# Patient Record
Sex: Female | Born: 1995 | Race: Black or African American | Hispanic: No | Smoking: Former smoker
Health system: Southern US, Community
[De-identification: ages and names within clinical notes are randomized; demographics above are authoritative.]

## PROBLEM LIST (undated history)

## (undated) DIAGNOSIS — C801 Malignant (primary) neoplasm, unspecified: Secondary | ICD-10-CM

## (undated) DIAGNOSIS — K219 Gastro-esophageal reflux disease without esophagitis: Secondary | ICD-10-CM

## (undated) DIAGNOSIS — I10 Essential (primary) hypertension: Secondary | ICD-10-CM

## (undated) DIAGNOSIS — J45909 Unspecified asthma, uncomplicated: Secondary | ICD-10-CM

---
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
## 2015-02-19 ENCOUNTER — Encounter (HOSPITAL_COMMUNITY): Payer: Self-pay | Admitting: Emergency Medicine

## 2015-02-19 ENCOUNTER — Emergency Department (HOSPITAL_COMMUNITY)
Admission: EM | Admit: 2015-02-19 | Discharge: 2015-02-19 | Disposition: A | Discharge status: Home / Self Care | Payer: No Typology Code available for payment source | Attending: Emergency Medicine | Admitting: Emergency Medicine

## 2015-02-19 DIAGNOSIS — Z79899 Other long term (current) drug therapy: Secondary | ICD-10-CM | POA: Insufficient documentation

## 2015-02-19 DIAGNOSIS — Y9389 Activity, other specified: Secondary | ICD-10-CM | POA: Diagnosis not present

## 2015-02-19 DIAGNOSIS — Z041 Encounter for examination and observation following transport accident: Secondary | ICD-10-CM | POA: Diagnosis present

## 2015-02-19 DIAGNOSIS — S8992XA Unspecified injury of left lower leg, initial encounter: Secondary | ICD-10-CM | POA: Insufficient documentation

## 2015-02-19 DIAGNOSIS — Y92410 Unspecified street and highway as the place of occurrence of the external cause: Secondary | ICD-10-CM | POA: Diagnosis not present

## 2015-02-19 DIAGNOSIS — J45909 Unspecified asthma, uncomplicated: Secondary | ICD-10-CM | POA: Insufficient documentation

## 2015-02-19 DIAGNOSIS — S4991XA Unspecified injury of right shoulder and upper arm, initial encounter: Secondary | ICD-10-CM | POA: Diagnosis not present

## 2015-02-19 DIAGNOSIS — S79912A Unspecified injury of left hip, initial encounter: Secondary | ICD-10-CM | POA: Diagnosis not present

## 2015-02-19 DIAGNOSIS — Z720 Tobacco use: Secondary | ICD-10-CM | POA: Diagnosis not present

## 2015-02-19 DIAGNOSIS — W2210XA Striking against or struck by unspecified automobile airbag, initial encounter: Secondary | ICD-10-CM

## 2015-02-19 DIAGNOSIS — Y998 Other external cause status: Secondary | ICD-10-CM | POA: Diagnosis not present

## 2015-02-19 MED ORDER — HYDROCORTISONE 1 % EX CREA
TOPICAL_CREAM | CUTANEOUS | Status: AC
  Administered 2015-02-19: 22:00:00 via TOPICAL
  Filled 2015-02-19 (×2): qty 28

## 2015-02-19 MED ORDER — IBUPROFEN 400 MG PO TABS
600.0000 mg | ORAL_TABLET | Freq: Once | ORAL | Status: AC
  Administered 2015-02-19: 600 mg via ORAL
  Filled 2015-02-19 (×2): qty 1

## 2015-02-19 NOTE — Discharge Instructions (Signed)
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
## 2015-05-20 ENCOUNTER — Emergency Department (HOSPITAL_COMMUNITY)
Admission: EM | Admit: 2015-05-20 | Discharge: 2015-05-20 | Disposition: A | Discharge status: Home / Self Care | Payer: Medicaid Other | Attending: Emergency Medicine | Admitting: Emergency Medicine

## 2015-05-20 ENCOUNTER — Encounter (HOSPITAL_COMMUNITY): Payer: Self-pay

## 2015-05-20 DIAGNOSIS — Z3202 Encounter for pregnancy test, result negative: Secondary | ICD-10-CM | POA: Diagnosis not present

## 2015-05-20 DIAGNOSIS — J45909 Unspecified asthma, uncomplicated: Secondary | ICD-10-CM | POA: Diagnosis not present

## 2015-05-20 DIAGNOSIS — R112 Nausea with vomiting, unspecified: Secondary | ICD-10-CM | POA: Insufficient documentation

## 2015-05-20 DIAGNOSIS — R1011 Right upper quadrant pain: Secondary | ICD-10-CM | POA: Diagnosis not present

## 2015-05-20 LAB — COMPREHENSIVE METABOLIC PANEL
ALBUMIN: 4 g/dL (ref 3.5–5.0)
ALT: 33 U/L (ref 14–54)
ANION GAP: 10 (ref 5–15)
AST: 33 U/L (ref 15–41)
Alkaline Phosphatase: 51 U/L (ref 38–126)
BUN: 7 mg/dL (ref 6–20)
CO2: 22 mmol/L (ref 22–32)
Calcium: 9.2 mg/dL (ref 8.9–10.3)
Chloride: 108 mmol/L (ref 101–111)
Creatinine, Ser: 0.79 mg/dL (ref 0.44–1.00)
GFR calc Af Amer: >60 mL/min (ref >60)
GFR calc non Af Amer: >60 mL/min (ref >60)
GLUCOSE: 141 mg/dL — AB (ref 65–99)
POTASSIUM: 3.7 mmol/L (ref 3.5–5.1)
SODIUM: 140 mmol/L (ref 135–145)
Total Bilirubin: 0.4 mg/dL (ref 0.3–1.2)
Total Protein: 7.3 g/dL (ref 6.5–8.1)

## 2015-05-20 LAB — URINALYSIS, ROUTINE W REFLEX MICROSCOPIC
Bilirubin Urine: NEGATIVE
Ketones, ur: 15 mg/dL — AB
Leukocytes, UA: NEGATIVE
Nitrite: NEGATIVE
Protein, ur: NEGATIVE mg/dL
SPECIFIC GRAVITY, URINE: 1.029 (ref 1.005–1.030)
UROBILINOGEN UA: 1 mg/dL (ref 0.0–1.0)
pH: 7 (ref 5.0–8.0)

## 2015-05-20 LAB — CBC
HEMATOCRIT: 35.7 % — AB (ref 36.0–46.0)
HEMOGLOBIN: 11.9 g/dL — AB (ref 12.0–15.0)
MCH: 30.7 pg (ref 26.0–34.0)
MCHC: 33.3 g/dL (ref 30.0–36.0)
MCV: 92 fL (ref 78.0–100.0)
Platelets: 250 10*3/uL (ref 150–400)
RBC: 3.88 MIL/uL (ref 3.87–5.11)
RDW: 12.7 % (ref 11.5–15.5)
WBC: 11.8 10*3/uL — ABNORMAL HIGH (ref 4.0–10.5)

## 2015-05-20 LAB — LIPASE, BLOOD: Lipase: 18 U/L — ABNORMAL LOW (ref 22–51)

## 2015-05-20 LAB — I-STAT BETA HCG BLOOD, ED (MC, WL, AP ONLY)

## 2015-05-20 LAB — URINE MICROSCOPIC-ADD ON

## 2015-05-20 MED ORDER — ONDANSETRON HCL 4 MG PO TABS: 4.0000 mg | ORAL_TABLET | Freq: Four times a day (QID) | ORAL | Status: DC

## 2015-05-20 MED ORDER — SODIUM CHLORIDE 0.9 % IV BOLUS (SEPSIS)
1000.0000 mL | Freq: Once | INTRAVENOUS | Status: AC
  Administered 2015-05-20: 1000 mL via INTRAVENOUS

## 2015-05-20 MED ORDER — ONDANSETRON HCL 4 MG/2ML IJ SOLN
4.0000 mg | Freq: Once | INTRAMUSCULAR | Status: AC | PRN
  Administered 2015-05-20: 4 mg via INTRAVENOUS
  Filled 2015-05-20: qty 2

## 2015-05-20 NOTE — Discharge Instructions (Signed)
Follow up with your PCP regarding high blood sugar and diabetes work up. Follow up with [facility] for heavy/painful periods.  Nausea and Vomiting Nausea means you feel sick to your stomach. Throwing up (vomiting) is a reflex where stomach contents come out of your mouth. HOME CARE   Take medicine as told by your doctor.  Do not force yourself to eat. However, you do need to drink fluids.  If you feel like eating, eat a normal diet as told by your doctor.  Eat rice, wheat, potatoes, bread, lean meats, yogurt, fruits, and vegetables.  Avoid high-fat foods.  Drink enough fluids to keep your pee (urine) clear or pale yellow.  Ask your doctor how to replace body fluid losses (rehydrate). Signs of body fluid loss (dehydration) include:  Feeling very thirsty.  Dry lips and mouth.  Feeling dizzy.  Dark pee.  Peeing less than normal.  Feeling confused.  Fast breathing or heart rate. GET HELP RIGHT AWAY IF:   You have blood in your throw up.  You have black or bloody poop (stool).  You have a bad headache or stiff neck.  You feel confused.  You have bad belly (abdominal) pain.  You have chest pain or trouble breathing.  You do not pee at least once every 8 hours.  You have cold, clammy skin.  You keep throwing up after 24 to 48 hours.  You have a fever. MAKE SURE YOU:   Understand these instructions.  Will watch your condition.  Will get help right away if you are not doing well or get worse. Document Released: 02/03/2008 Document Revised: 11/09/2011 Document Reviewed: 01/16/2011 [facility] Patient Information 2015 [place], [place]. This information is not intended to replace advice given to you by your health care provider. Make sure you discuss any questions you have with your health care provider.

## 2015-05-20 NOTE — ED Notes (Signed)
Pt given gingerale and crackers 

## 2015-05-20 NOTE — ED Provider Notes (Signed)
  Face-to-face evaluation   History: She complains of recurrent vomiting associated with menses, and have vaginal bleeding with menses.    Physical exam: Alert, calm, cooperative. She appears comfortable.    Medical screening examination/treatment/procedure(s) were conducted as a shared visit with non-physician practitioner(s) and myself.  I personally evaluated the patient during the encounter  Mancel Bale, MD 05/20/15 2100

## 2015-05-20 NOTE — ED Notes (Signed)
C/o vomiting since 1 am denies abdominal pain

## 2015-05-20 NOTE — ED Provider Notes (Signed)
CSN: [id number]     Arrival date & time 05/20/15  0919 History   First MD Initiated Contact with Patient 05/20/15 1158     Chief Complaint  Patient presents with  . Emesis     (Consider location/radiation/quality/duration/timing/severity/associated sxs/prior Treatment) HPI   Brandi [name] is a 19 y.o. female with PMH significant for asthma who presents with NBNB emesis since 1 AM this morning with associated anorexia, decreased PO intake, nausea, chills, and abdominal pain.  She states she has had approximately 3 episodes of emesis every hour with the last episode at 11:30 AM.  Denies CP, SOB, diarrhea, urinary symptoms, fever, HA, or neck stiffness.  She is currently on her period.  No history of abdominal surgeries.  She has received zofran, and states that her nausea is much improved.   Past Medical History  Diagnosis Date  . Asthma    History reviewed. No pertinent past surgical history. History reviewed. No pertinent family history. Social History  Substance Use Topics  . Smoking status: Never Smoker   . Smokeless tobacco: None  . Alcohol Use: No   OB History    Gravida Para Term Preterm AB TAB SAB Ectopic Multiple Living       Review of Systems All other systems negative unless otherwise stated in HPI    Allergies  Shellfish allergy  Home Medications   Prior to Admission medications   Medication Sig Start Date End Date Taking? Authorizing Provider  acetaminophen (TYLENOL) 325 MG tablet Take 650 mg by mouth every 6 (six) hours as needed for mild pain.   Yes Historical Provider, MD  Triprolidine-Pseudoephedrine (ANTIHISTAMINE PO) Take 3 tablets by mouth daily as needed (allergies).   Yes Historical Provider, MD  loratadine-pseudoephedrine (CLARITIN-D 12 HOUR) 5-120 MG per tablet Take 1 tablet by mouth 2 (two) times daily. Patient not taking: Reported on 05/20/2015 10/31/12   Santiago Glad, PA-C   BP 111/69 mmHg  Pulse 78  Temp(Src) 98.5 F  (36.9 C) (Oral)  Resp 16  Wt 138 lb (62.596 kg)  SpO2 100%  LMP 05/20/2015 Physical Exam  Constitutional: She is oriented to person, place, and time. She appears well-developed and well-nourished.  HENT:  Head: Normocephalic and atraumatic.  Mouth/Throat: Oropharynx is clear and moist.  Eyes: Pupils are equal, round, and reactive to light.  Neck: Normal range of motion. Neck supple.  Cardiovascular: Normal rate, regular rhythm and normal heart sounds.   No murmur heard. Pulmonary/Chest: Effort normal and breath sounds normal. No respiratory distress. She has no wheezes. She has no rales.  Abdominal: Soft. Bowel sounds are normal. She exhibits no distension. There is tenderness in the right upper quadrant. There is no rigidity, no rebound and no guarding.  Musculoskeletal: Normal range of motion.  Lymphadenopathy:    She has no cervical adenopathy.  Neurological: She is alert and oriented to person, place, and time.  Skin: Skin is warm and dry.  Psychiatric: She has a normal mood and affect. Her behavior is normal.    ED Course  Procedures (including critical care time) Labs Review Labs Reviewed  LIPASE, BLOOD - Abnormal; Notable for the following:    Lipase 18 (*)    All other components within normal limits  COMPREHENSIVE METABOLIC PANEL - Abnormal; Notable for the following:    Glucose, Bld 141 (*)    All other components within normal limits  CBC - Abnormal; Notable for the following:  WBC 11.8 (*)    Hemoglobin 11.9 (*)    HCT 35.7 (*)    All other components within normal limits  URINALYSIS, ROUTINE W REFLEX MICROSCOPIC (NOT AT [facility]) - Abnormal; Notable for the following:    Glucose, UA >1000 (*)    Hgb urine dipstick SMALL (*)    Ketones, ur 15 (*)    All other components within normal limits  URINE MICROSCOPIC-ADD ON  I-STAT BETA HCG BLOOD, ED ([facility], [facility], [facility] ONLY)    Imaging Review No results found. I have personally reviewed and evaluated these images and lab  results as part of my medical decision-making.   EKG Interpretation None      MDM   Final diagnoses:  None   Patient presents with emesis.  VSS, patient appears nontoxic, NAD.  On exam, mild RUQ tenderness.  rebound, guarding, or rigidity. Low suspicion for acute abdominal etiology. Imaging not performed.  Labs include lipase, CMP, CBC, UA, and hcg.  UA shows >1000 glucose most likely stress response to wretching and frequent episodes of emesis. Pt given zofran and fluids in ED. Suspect nausea vomiting. Pt stable for d/c.  Pt able to tolerate PO intake.  Advised to follow up with PCP and gynecology (for menorrhagia).  Discussed return precautions and supportive care.  Patient acknowledges and agrees with the above plan.  Case has been discussed with and seen by Dr. Effie Shy who agrees with the above plan for discharge.     Cheri Fowler, PA-C 05/20/15 1508  Mancel Bale, MD 05/20/15 2100

## 2016-03-04 ENCOUNTER — Emergency Department (HOSPITAL_COMMUNITY)
Admission: EM | Admit: 2016-03-04 | Discharge: 2016-03-05 | Disposition: A | Discharge status: Home / Self Care | Payer: Medicaid Other | Attending: Emergency Medicine | Admitting: Emergency Medicine

## 2016-03-04 ENCOUNTER — Encounter (HOSPITAL_COMMUNITY): Payer: Self-pay

## 2016-03-04 DIAGNOSIS — J45909 Unspecified asthma, uncomplicated: Secondary | ICD-10-CM | POA: Diagnosis not present

## 2016-03-04 DIAGNOSIS — R112 Nausea with vomiting, unspecified: Secondary | ICD-10-CM | POA: Diagnosis present

## 2016-03-04 LAB — COMPREHENSIVE METABOLIC PANEL
ALT: 42 U/L (ref 14–54)
ANION GAP: 12 (ref 5–15)
AST: 39 U/L (ref 15–41)
Albumin: 4.5 g/dL (ref 3.5–5.0)
Alkaline Phosphatase: 53 U/L (ref 38–126)
BUN: 8 mg/dL (ref 6–20)
CHLORIDE: 105 mmol/L (ref 101–111)
CO2: 20 mmol/L — ABNORMAL LOW (ref 22–32)
Calcium: 9.8 mg/dL (ref 8.9–10.3)
Creatinine, Ser: 0.81 mg/dL (ref 0.44–1.00)
Glucose, Bld: 155 mg/dL — ABNORMAL HIGH (ref 65–99)
POTASSIUM: 3.6 mmol/L (ref 3.5–5.1)
SODIUM: 137 mmol/L (ref 135–145)
Total Bilirubin: 0.3 mg/dL (ref 0.3–1.2)
Total Protein: 7.9 g/dL (ref 6.5–8.1)

## 2016-03-04 LAB — CBC
HEMATOCRIT: 38.2 % (ref 36.0–46.0)
HEMOGLOBIN: 12.8 g/dL (ref 12.0–15.0)
MCH: 30.7 pg (ref 26.0–34.0)
MCHC: 33.5 g/dL (ref 30.0–36.0)
MCV: 91.6 fL (ref 78.0–100.0)
PLATELETS: 303 10*3/uL (ref 150–400)
RBC: 4.17 MIL/uL (ref 3.87–5.11)
RDW: 12.5 % (ref 11.5–15.5)
WBC: 15.7 10*3/uL — AB (ref 4.0–10.5)

## 2016-03-04 LAB — I-STAT BETA HCG BLOOD, ED (MC, WL, AP ONLY): I-stat hCG, quantitative: <5 m[IU]/mL (ref <5)

## 2016-03-04 LAB — LIPASE, BLOOD: LIPASE: 18 U/L (ref 11–51)

## 2016-03-04 MED ORDER — ONDANSETRON 4 MG PREPACK (~~LOC~~): 1.0000 | ORAL_TABLET | Freq: Three times a day (TID) | ORAL | Status: DC | PRN

## 2016-03-04 MED ORDER — ONDANSETRON HCL 4 MG/2ML IJ SOLN
4.0000 mg | Freq: Once | INTRAMUSCULAR | Status: AC
  Administered 2016-03-04: 4 mg via INTRAVENOUS
  Filled 2016-03-04: qty 2

## 2016-03-04 MED ORDER — SODIUM CHLORIDE 0.9 % IV BOLUS (SEPSIS)
1000.0000 mL | Freq: Once | INTRAVENOUS | Status: AC
  Administered 2016-03-04: 1000 mL via INTRAVENOUS

## 2016-03-04 NOTE — ED Notes (Signed)
Pt resting quietly at this time with eyes closed,

## 2016-03-04 NOTE — ED Provider Notes (Signed)
CSN: [id number]     Arrival date & time 03/04/16  1833 History   First MD Initiated Contact with Patient 03/04/16 1853     Chief Complaint  Patient presents with  . Emesis     (Consider location/radiation/quality/duration/timing/severity/associated sxs/prior Treatment) HPI Patient presents with nausea vomiting since 7 AM this morning. She endorses greater than 10 episodes of emesis throughout the day. No chest pain or difficulty breathing. She does not have any hematemesis. She is currently on her period and states that she has emesis nearly every period and is here today for relief of her nausea. She endorses lower abdominal cramping consistent with her period.   She does not think there is any chance that she is pregnant as she has not had intercourse with a female. She does not have any vaginal discharge dysuria or unilateral pelvic pain.   Past Medical History  Diagnosis Date  . Asthma    History reviewed. No pertinent past surgical history. No family history on file. Social History  Substance Use Topics  . Smoking status: Never Smoker   . Smokeless tobacco: None  . Alcohol Use: No   OB History    Gravida Para Term Preterm AB TAB SAB Ectopic Multiple Living   0 0 0 0 0 0 0 0 0 0      Review of Systems  Constitutional: Negative for chills and activity change.  HENT: Negative for congestion.   Eyes: Negative for visual disturbance.  Respiratory: Negative for shortness of breath.   Gastrointestinal: Positive for nausea, vomiting and abdominal pain. Negative for diarrhea, constipation, blood in stool and abdominal distention.  Genitourinary: Positive for vaginal bleeding. Negative for dysuria, decreased urine volume, vaginal discharge and vaginal pain.  Skin: Negative for rash and wound.  Neurological: Positive for tremors.  Psychiatric/Behavioral: Negative for confusion and agitation.      Allergies  Shellfish allergy  Home Medications   Prior to Admission medications    Medication Sig Start Date End Date Taking? Authorizing Provider  acetaminophen (TYLENOL) 325 MG tablet Take 650 mg by mouth every 6 (six) hours as needed for mild pain.   Yes Historical Provider, MD  ondansetron (ZOFRAN) 4 MG tablet Take 1 tablet (4 mg total) by mouth every 6 (six) hours. 05/20/15  Yes Kayla Rose, PA-C  Triprolidine-Pseudoephedrine (ANTIHISTAMINE PO) Take 3 tablets by mouth daily as needed (allergies).   Yes Historical Provider, MD  loratadine-pseudoephedrine (CLARITIN-D 12 HOUR) 5-120 MG per tablet Take 1 tablet by mouth 2 (two) times daily. Patient not taking: Reported on 05/20/2015 10/31/12   Heather Laisure, PA-C   BP 143/91 mmHg  Pulse 85  Temp(Src) 98.9 F (37.2 C) (Oral)  Resp 24  Ht 5\' 2"  (1.575 m)  Wt 63.05 kg  BMI 25.42 kg/m2  SpO2 100%  LMP 03/02/2016 Physical Exam  Constitutional: She is oriented to person, place, and time. She appears well-developed and well-nourished. No distress.  HENT:  Head: Normocephalic and atraumatic.  Eyes: Conjunctivae are normal.  Cardiovascular: Normal rate and normal heart sounds.   No murmur heard. Pulmonary/Chest: Effort normal and breath sounds normal.  Abdominal: Soft. She exhibits no distension. There is no tenderness. There is no rebound and no guarding.  Musculoskeletal: She exhibits no edema.  Neurological: She is alert and oriented to person, place, and time.  Skin: Skin is warm. She is not diaphoretic.  Psychiatric: She has a normal mood and affect. Her behavior is normal.  Nursing note and vitals reviewed.   ED  Course  Procedures (including critical care time) Labs Review Labs Reviewed  CBC - Abnormal; Notable for the following:    WBC 15.7 (*)    All other components within normal limits  LIPASE, BLOOD  COMPREHENSIVE METABOLIC PANEL  URINALYSIS, ROUTINE W REFLEX MICROSCOPIC (NOT [facility])  I-STAT BETA HCG BLOOD, ED ([facility], [facility], [facility] ONLY)    Imaging Review No results found. I have personally reviewed and  evaluated these images and lab results as part of my medical decision-making.   EKG Interpretation None      MDM   Final diagnoses:  None    Ms. Schriever presenting for multiple episodes of emesis associated with her period consistent with many previous periods. She feels improved with 4 mg Zofran. She's been seen for similar episodes in the past. She denies any vaginal discharge or unilateral pain. Pregnancy test negative. Patient discharged home in good condition with instructions to follow up with PCP as needed.  Levora AngelEric Annamaria Salah, MD 03/04/16 2255  Azalia BilisKevin Campos, MD 03/04/16 [phone]

## 2016-03-04 NOTE — Discharge Instructions (Signed)

## 2016-03-04 NOTE — ED Notes (Signed)
Pt st's she is on her period and has severe cramping.  Also st's she has nausea and vomiting every month when she starts her period

## 2016-03-04 NOTE — ED Notes (Signed)
Patient here with vomiting and abdominal cramping since 0700. Denies diarrhea.  Reports multiple episodes of vomiting

## 2016-03-04 NOTE — ED Notes (Signed)
Pt is awake now and requesting more nausea medication.  Zofran given for same

## 2016-03-05 ENCOUNTER — Telehealth: Payer: Self-pay | Admitting: *Deleted

## 2016-03-06 ENCOUNTER — Emergency Department (HOSPITAL_COMMUNITY)
Admission: EM | Admit: 2016-03-06 | Discharge: 2016-03-06 | Disposition: A | Discharge status: Home / Self Care | Payer: Medicaid Other | Attending: Emergency Medicine | Admitting: Emergency Medicine

## 2016-03-06 ENCOUNTER — Emergency Department (HOSPITAL_COMMUNITY): Payer: Medicaid Other

## 2016-03-06 ENCOUNTER — Encounter (HOSPITAL_COMMUNITY): Payer: Self-pay | Admitting: Physical Medicine and Rehabilitation

## 2016-03-06 DIAGNOSIS — Z79899 Other long term (current) drug therapy: Secondary | ICD-10-CM | POA: Diagnosis not present

## 2016-03-06 DIAGNOSIS — I880 Nonspecific mesenteric lymphadenitis: Secondary | ICD-10-CM | POA: Diagnosis not present

## 2016-03-06 DIAGNOSIS — J45909 Unspecified asthma, uncomplicated: Secondary | ICD-10-CM | POA: Diagnosis not present

## 2016-03-06 DIAGNOSIS — K529 Noninfective gastroenteritis and colitis, unspecified: Secondary | ICD-10-CM | POA: Insufficient documentation

## 2016-03-06 DIAGNOSIS — R109 Unspecified abdominal pain: Secondary | ICD-10-CM

## 2016-03-06 DIAGNOSIS — R1084 Generalized abdominal pain: Secondary | ICD-10-CM | POA: Diagnosis present

## 2016-03-06 LAB — COMPREHENSIVE METABOLIC PANEL
ALBUMIN: 3.9 g/dL (ref 3.5–5.0)
ALT: 34 U/L (ref 14–54)
ANION GAP: 5 (ref 5–15)
AST: 27 U/L (ref 15–41)
Alkaline Phosphatase: 48 U/L (ref 38–126)
BUN: 11 mg/dL (ref 6–20)
CHLORIDE: 107 mmol/L (ref 101–111)
CO2: 27 mmol/L (ref 22–32)
Calcium: 9.3 mg/dL (ref 8.9–10.3)
Creatinine, Ser: 0.77 mg/dL (ref 0.44–1.00)
GFR calc Af Amer: >60 mL/min (ref >60)
GLUCOSE: 114 mg/dL — AB (ref 65–99)
POTASSIUM: 3.4 mmol/L — AB (ref 3.5–5.1)
Sodium: 139 mmol/L (ref 135–145)
TOTAL PROTEIN: 7.4 g/dL (ref 6.5–8.1)
Total Bilirubin: 0.6 mg/dL (ref 0.3–1.2)

## 2016-03-06 LAB — CBC WITH DIFFERENTIAL/PLATELET
BASOS ABS: 0 10*3/uL (ref 0.0–0.1)
BASOS PCT: 0 %
EOS PCT: 0 %
Eosinophils Absolute: 0 10*3/uL (ref 0.0–0.7)
HCT: 36.7 % (ref 36.0–46.0)
Hemoglobin: 12.2 g/dL (ref 12.0–15.0)
Lymphocytes Relative: 10 %
Lymphs Abs: 0.9 10*3/uL (ref 0.7–4.0)
MCH: 30.7 pg (ref 26.0–34.0)
MCHC: 33.2 g/dL (ref 30.0–36.0)
MCV: 92.2 fL (ref 78.0–100.0)
MONO ABS: 0.4 10*3/uL (ref 0.1–1.0)
MONOS PCT: 4 %
Neutro Abs: 7.9 10*3/uL — ABNORMAL HIGH (ref 1.7–7.7)
Neutrophils Relative %: 86 %
PLATELETS: 287 10*3/uL (ref 150–400)
RBC: 3.98 MIL/uL (ref 3.87–5.11)
RDW: 12.7 % (ref 11.5–15.5)
WBC: 9.2 10*3/uL (ref 4.0–10.5)

## 2016-03-06 LAB — WET PREP, GENITAL
CLUE CELLS WET PREP: NONE SEEN
Sperm: NONE SEEN
TRICH WET PREP: NONE SEEN
Yeast Wet Prep HPF POC: NONE SEEN

## 2016-03-06 LAB — LIPASE, BLOOD: LIPASE: 20 U/L (ref 11–51)

## 2016-03-06 MED ORDER — METOCLOPRAMIDE HCL 5 MG/ML IJ SOLN
10.0000 mg | Freq: Once | INTRAMUSCULAR | Status: AC
  Administered 2016-03-06: 10 mg via INTRAVENOUS
  Filled 2016-03-06: qty 2

## 2016-03-06 MED ORDER — PROMETHAZINE HCL 12.5 MG PO TABS: 12.5000 mg | ORAL_TABLET | Freq: Four times a day (QID) | ORAL | Status: DC | PRN

## 2016-03-06 MED ORDER — IOPAMIDOL (ISOVUE-300) INJECTION 61%
INTRAVENOUS | Status: AC
  Administered 2016-03-06: 75 mL via INTRAVENOUS
  Filled 2016-03-06: qty 75

## 2016-03-06 MED ORDER — SODIUM CHLORIDE 0.9 % IV BOLUS (SEPSIS)
1000.0000 mL | Freq: Once | INTRAVENOUS | Status: AC
  Administered 2016-03-06: 1000 mL via INTRAVENOUS

## 2016-03-06 NOTE — ED Notes (Signed)
Pelvic cart at bedside and set up.  

## 2016-03-06 NOTE — Discharge Instructions (Signed)
Take tylenol or motrin for pain. Phenergan for nausea and vomiting. Follow up with your doctor. Return if worsening.   Viral Gastroenteritis Viral gastroenteritis is also known as stomach flu. This condition affects the stomach and intestinal tract. It can cause sudden diarrhea and vomiting. The illness typically lasts 3 to 8 days. Most people develop an immune response that eventually gets rid of the virus. While this natural response develops, the virus can make you quite ill. CAUSES  Many different viruses can cause gastroenteritis, such as rotavirus or noroviruses. You can catch one of these viruses by consuming contaminated food or water. You may also catch a virus by sharing utensils or other personal items with an infected person or by touching a contaminated surface. SYMPTOMS  The most common symptoms are diarrhea and vomiting. These problems can cause a severe loss of body fluids (dehydration) and a body salt (electrolyte) imbalance. Other symptoms may include:  Fever.  Headache.  Fatigue.  Abdominal pain. DIAGNOSIS  Your caregiver can usually diagnose viral gastroenteritis based on your symptoms and a physical exam. A stool sample may also be taken to test for the presence of viruses or other infections. TREATMENT  This illness typically goes away on its own. Treatments are aimed at rehydration. The most serious cases of viral gastroenteritis involve vomiting so severely that you are not able to keep fluids down. In these cases, fluids must be given through an intravenous line (IV). HOME CARE INSTRUCTIONS   Drink enough fluids to keep your urine clear or pale yellow. Drink small amounts of fluids frequently and increase the amounts as tolerated.  Ask your caregiver for specific rehydration instructions.  Avoid:  Foods high in sugar.  Alcohol.  Carbonated drinks.  Tobacco.  Juice.  Caffeine drinks.  Extremely hot or cold fluids.  Fatty, greasy foods.  Too much  intake of anything at one time.  Dairy products until 24 to 48 hours after diarrhea stops.  You may consume probiotics. Probiotics are active cultures of beneficial bacteria. They may lessen the amount and number of diarrheal stools in adults. Probiotics can be found in yogurt with active cultures and in supplements.  Wash your hands well to avoid spreading the virus.  Only take over-the-counter or prescription medicines for pain, discomfort, or fever as directed by your caregiver. Do not give aspirin to children. Antidiarrheal medicines are not recommended.  Ask your caregiver if you should continue to take your regular prescribed and over-the-counter medicines.  Keep all follow-up appointments as directed by your caregiver. SEEK IMMEDIATE MEDICAL CARE IF:   You are unable to keep fluids down.  You do not urinate at least once every 6 to 8 hours.  You develop shortness of breath.  You notice blood in your stool or vomit. This may look like coffee grounds.  You have abdominal pain that increases or is concentrated in one small area (localized).  You have persistent vomiting or diarrhea.  You have a fever.  The patient is a child younger than 3 months, and he or she has a fever.  The patient is a child older than 3 months, and he or she has a fever and persistent symptoms.  The patient is a child older than 3 months, and he or she has a fever and symptoms suddenly get worse.  The patient is a baby, and he or she has no tears when crying. MAKE SURE YOU:   Understand these instructions.  Will watch your condition.  Will get  help right away if you are not doing well or get worse.   This information is not intended to replace advice given to you by your health care provider. Make sure you discuss any questions you have with your health care provider.   Document Released: 08/17/2005 Document Revised: 11/09/2011 Document Reviewed: 06/03/2011 Elsevier Interactive Patient  Education [employer]2016 [employer] Inc[employer]

## 2016-03-06 NOTE — ED Notes (Signed)
Patient able to ambulate independently  

## 2016-03-06 NOTE — ED Provider Notes (Signed)
CSN: [id number]     Arrival date & time 03/06/16  1002 History   First MD Initiated Contact with Patient 03/06/16 1023     Chief Complaint  Patient presents with  . Abdominal Pain     (Consider location/radiation/quality/duration/timing/severity/associated sxs/prior Treatment) HPI Cathryne Silba is a 20 y.o. female with history of asthma, presents to emergency department with complaint of nausea, vomiting, abdominal pain. Patient states her abdominal pain is generalized. Symptoms started 2 days ago. She was seen in emergency department at that time and had blood work done and had medications to help with her symptoms. No imaging was obtained at this time. Patient reported during last visit that she has had similar symptoms before with her menstrual cycles, and she is currently menstruating. She is sexually active, denies possible pregnancy. She denies any abnormal vaginal discharge. She states she is throwing up food contents. She hasn't been able to eat in 2 days. States she drank some ginger ale but threw it up. She states she has tried taking Zofran, but states the taste of Zofran is making her throw up as well   Past Medical History  Diagnosis Date  . Asthma    History reviewed. No pertinent past surgical history. No family history on file. Social History  Substance Use Topics  . Smoking status: Never Smoker   . Smokeless tobacco: None  . Alcohol Use: No   OB History    Gravida Para Term Preterm AB TAB SAB Ectopic Multiple Living       Review of Systems  Constitutional: Negative for fever and chills.  Respiratory: Negative for cough, chest tightness and shortness of breath.   Cardiovascular: Negative for chest pain, palpitations and leg swelling.  Gastrointestinal: Positive for nausea, vomiting and abdominal pain. Negative for diarrhea and blood in stool.  Genitourinary: Positive for vaginal bleeding. Negative for dysuria, flank pain, vaginal discharge, vaginal  pain and pelvic pain.  Musculoskeletal: Negative for myalgias, arthralgias, neck pain and neck stiffness.  Skin: Negative for rash.  Neurological: Negative for dizziness, weakness and headaches.  All other systems reviewed and are negative.     Allergies  Shellfish allergy  Home Medications   Prior to Admission medications   Medication Sig Start Date End Date Taking? Authorizing Provider  acetaminophen (TYLENOL) 325 MG tablet Take 650 mg by mouth every 6 (six) hours as needed for mild pain.    Historical Provider, MD  loratadine-pseudoephedrine (CLARITIN-D 12 HOUR) 5-120 MG per tablet Take 1 tablet by mouth 2 (two) times daily. Patient not taking: Reported on 05/20/2015 10/31/12   Santiago Glad, PA-C  ondansetron (ZOFRAN) 4 MG tablet Take 1 tablet (4 mg total) by mouth every 6 (six) hours. 05/20/15   Kayla Rose, PA-C  ondansetron (ZOFRAN) 4 mg TABS tablet Take 4 tablets by mouth every 8 (eight) hours as needed. 03/04/16   Levora Angel, MD  Triprolidine-Pseudoephedrine (ANTIHISTAMINE PO) Take 3 tablets by mouth daily as needed (allergies).    Historical Provider, MD   BP 103/58 mmHg  Pulse 77  Temp(Src) 99 F (37.2 C) (Oral)  Resp 20  Ht  (1.575 m)  Wt 63.05 kg  BMI 25.42 kg/m2  SpO2 100%  LMP 03/02/2016 Physical Exam  Constitutional: She is oriented to person, place, and time. She appears well-developed and well-nourished. No distress.  HENT:  Head: Normocephalic.  Eyes: Conjunctivae are normal.  Neck: Neck supple.  Cardiovascular: Normal rate, regular rhythm  and normal heart sounds.   Pulmonary/Chest: Effort normal and breath sounds normal. No respiratory distress. She has no wheezes. She has no rales.  Abdominal: Soft. Bowel sounds are normal. She exhibits no distension. There is tenderness. There is no rebound.  Diffuse abdominal tenderness with worse tenderness in the right lower quadrant  Genitourinary:  Normal external genitalia. Normal vaginal canal. Small thin  white discharge. Cervix is normal, closed. No CMT. No uterine or adnexal tenderness. No masses palpated.    Musculoskeletal: She exhibits no edema.  Neurological: She is alert and oriented to person, place, and time.  Skin: Skin is warm and dry.  Psychiatric: She has a normal mood and affect. Her behavior is normal.  Nursing note and vitals reviewed.   ED Course  Procedures (including critical care time) Labs Review Labs Reviewed  WET PREP, GENITAL - Abnormal; Notable for the following:    WBC, Wet Prep HPF POC PRESENT (*)    All other components within normal limits  CBC WITH DIFFERENTIAL/PLATELET - Abnormal; Notable for the following:    Neutro Abs 7.9 (*)    All other components within normal limits  COMPREHENSIVE METABOLIC PANEL - Abnormal; Notable for the following:    Potassium 3.4 (*)    Glucose, Bld 114 (*)    All other components within normal limits  LIPASE, BLOOD  URINALYSIS, ROUTINE W REFLEX MICROSCOPIC (NOT AT [facility])  POC URINE PREG, ED  GC/CHLAMYDIA PROBE AMP ([place]) [facility]    Imaging Review Ct Abdomen Pelvis W Contrast  03/06/2016  CLINICAL DATA:  Diffuse abdominal pain with nausea and vomiting for 2 days EXAM: CT ABDOMEN AND PELVIS WITH CONTRAST TECHNIQUE: Multidetector CT imaging of the abdomen and pelvis was performed using the standard protocol following bolus administration of intravenous contrast. CONTRAST:  75 cc Isovue 300 COMPARISON:  None. FINDINGS: Lower chest:  No acute findings. Hepatobiliary: Liver and gallbladder appear normal. Pancreas: No mass, inflammatory changes, or other significant abnormality. Spleen: Within normal limits in size and appearance. Adrenals/Urinary Tract: Adrenal glands appear normal. Kidneys appear normal without stone or hydronephrosis. No ureteral or bladder calculi identified. Bladder appears normal, partially decompressed. Stomach/Bowel: Bowel is normal in caliber. The majority of the large bowel is decompressed which  limits characterization of its walls but there is no evidence of bowel wall thickening or bowel wall inflammation seen. Fluid is seen throughout a significant portion of the nondistended small bowel. Fluid is also present within the stomach. Appendix is not seen in its entirety but visualized portion appears normal and there is no inflammation about the cecum to suggest acute appendicitis. Vascular/Lymphatic: No pathologically enlarged lymph nodes. There are small lymph nodes clustered in the right lower quadrant mesentery and additional small lymph nodes scattered within the central abdominal mesentery. No evidence of abdominal aortic aneurysm. Reproductive: No mass or other significant abnormality. Other: No free fluid or abscess collections seen. No free intraperitoneal air. Musculoskeletal: No acute or suspicious osseous findings. Superficial soft tissues are unremarkable. IMPRESSION: 1. Fluid throughout a significant portion of the nondistended small bowel. There is also fluid within the stomach. This can be an indication of underlying gastroenteritis. Bowel is otherwise unremarkable. No bowel obstruction. 2. Small lymph nodes clustered within the right lower quadrant mesentery and additional small lymph nodes in the central abdominal mesentery. This may represent an associated mild mesenteric adenitis. 3. Remainder of the abdomen and pelvis CT is unremarkable, as detailed above. Electronically Signed   By: Bary RichardStan  Maynard  M.D.   On: 03/06/2016 14:29   I have personally reviewed and evaluated these images and lab results as part of my medical decision-making.   EKG Interpretation None      MDM   Final diagnoses:  Abdominal pain, unspecified abdominal location  Gastroenteritis  Mesenteric adenitis   Pt's 2nd visit in two days for same complaint. States she is having abdominal pain and vomiting. Right lower quadrant pain and exam. Will repeat labs and get CT abdomen and pelvis to rule out  appendicitis.  Pelvic exam unremarkable, doubt cervicitis or PID, cultures sent. Labs unremarkable and slightly low potassium of 3.4. CT abdomen and pelvis obtained, showing gastroenteritis and mesenteric adenitis. Will treat with NSAIDs and Tylenol, Phenergan for nausea, follow-up. Patient tolerated oral fluids emergency department. Pain improved with Reglan.  Filed Vitals:   03/06/16 1009 03/06/16 1039 03/06/16 1140 03/06/16 1345  BP: 125/70 103/58 127/72 111/70  Pulse: 73 77 70 65  Temp: 99.4 F (37.4 C) 99 F (37.2 C)    TempSrc: Oral Oral    Resp: 18 20 18    Height:  5\' 2"  (1.575 m)    Weight:  63.05 kg    SpO2: 100% 100% 100% 99%     Jaynie Crumbleatyana Jeniah Kishi, PA-C 03/06/16 1523  Linwood DibblesJon Knapp, MD 03/07/16 1501

## 2016-03-06 NOTE — ED Notes (Signed)
Pt reports diffuse abdominal pain. Onset Thursday. Also states nausea/vomiting. Was seen for same yesterday, no relief from symptoms.

## 2016-03-09 LAB — GC/CHLAMYDIA PROBE AMP (~~LOC~~) NOT AT ARMC
CHLAMYDIA, DNA PROBE: NEGATIVE
NEISSERIA GONORRHEA: NEGATIVE

## 2017-01-14 ENCOUNTER — Ambulatory Visit (HOSPITAL_COMMUNITY)
Admission: EM | Admit: 2017-01-14 | Discharge: 2017-01-14 | Disposition: A | Discharge status: Home / Self Care | Payer: Self-pay | Attending: Family Medicine | Admitting: Family Medicine

## 2017-01-14 ENCOUNTER — Ambulatory Visit (INDEPENDENT_AMBULATORY_CARE_PROVIDER_SITE_OTHER): Payer: Self-pay

## 2017-01-14 ENCOUNTER — Encounter (HOSPITAL_COMMUNITY): Payer: Self-pay | Admitting: Emergency Medicine

## 2017-01-14 DIAGNOSIS — S83501A Sprain of unspecified cruciate ligament of right knee, initial encounter: Secondary | ICD-10-CM

## 2017-01-14 MED ORDER — DICLOFENAC SODIUM 75 MG PO TBEC: 75.0000 mg | DELAYED_RELEASE_TABLET | Freq: Two times a day (BID) | ORAL | 0 refills | Status: DC

## 2017-01-14 NOTE — ED Provider Notes (Signed)
[facility]    CSN: [id number] Arrival date & time: 01/14/17  1947     History   Chief Complaint Chief Complaint  Patient presents with  . Leg Pain    HPI Brandi [name] is a 21 y.o. female.   This is 21 year old woman who presents with right knee pain following jump roping exercise 2 days ago and experiencing a pop. Patient has had pain behind the knee radiating down the calf into the heel ever since.  She says it causes pain when she bears weight. She works Education officer, [employer].      Past Medical History:  Diagnosis Date  . Asthma     There are no active problems to display for this patient.   History reviewed. No pertinent surgical history.  OB History    Gravida Para Term Preterm AB Living   0 0 0 0 0 0   SAB TAB Ectopic Multiple Live Births   0 0 0 0         Home Medications    Prior to Admission medications   Medication Sig Start Date End Date Taking? Authorizing Provider  acetaminophen (TYLENOL) 325 MG tablet Take 650 mg by mouth every 6 (six) hours as needed for mild pain.    [provider]  diclofenac (VOLTAREN) 75 MG EC tablet Take 1 tablet (75 mg total) by mouth 2 (two) times daily. 01/14/17   Elvina Sidle, MD  promethazine (PHENERGAN) 12.5 MG tablet Take 1 tablet (12.5 mg total) by mouth every 6 (six) hours as needed for nausea or vomiting. 03/06/16   Kirichenko, Tatyana, PA-C  Triprolidine-Pseudoephedrine (ANTIHISTAMINE PO) Take 3 tablets by mouth daily as needed (allergies).    [provider]    Family History No family history on file.  Social History Social History  Substance Use Topics  . Smoking status: Never Smoker  . Smokeless tobacco: Not on file  . Alcohol use No     Allergies   Shellfish allergy   Review of Systems Review of Systems  Musculoskeletal: Positive for gait problem.  All other systems reviewed and are negative.    Physical Exam Triage Vital Signs ED Triage Vitals  Enc Vitals  Group     BP 01/14/17 2006 130/73     Pulse Rate 01/14/17 2006 77     Resp 01/14/17 2006 16     Temp 01/14/17 2006 99.1 F (37.3 C)     Temp Source 01/14/17 2006 Oral     SpO2 01/14/17 2006 100 %     Weight --      Height --      Head Circumference --      Peak Flow --      Pain Score 01/14/17 2004 9     Pain Loc --      Pain Edu? --      Excl. in GC? --    No data found.   Updated Vital Signs BP 130/73 (BP Location: Right Arm)   Pulse 77   Temp 99.1 F (37.3 C) (Oral)   Resp 16   LMP 11/30/2016 (Exact Date) Comment: denies pregnancy  SpO2 100%    Physical Exam  Constitutional: She is oriented to person, place, and time. She appears well-developed and well-nourished.  HENT:  Right Ear: External ear normal.  Left Ear: External ear normal.  Eyes: Conjunctivae are normal. Pupils are equal, round, and reactive to light.  Neck: Normal range of motion. Neck supple.  Pulmonary/Chest: Effort  normal.  Musculoskeletal: She exhibits no deformity.  Patient has mild tenderness in the popliteal region of her right knee. Inspection of the right knee shows no deformity or effusion. Further palpation reveals no tenderness over the lateral collateral ligament or medial collateral ligament.  Ligamentous testing shows some discomfort with the anterior drawer maneuver only.  Neurological: She is alert and oriented to person, place, and time.  Skin: Skin is warm and dry.  Nursing note and vitals reviewed.    UC Treatments / Results  Labs (all labs ordered are listed, but only abnormal results are displayed) Labs Reviewed - No data to display  EKG  EKG Interpretation None       Radiology Dg Knee Complete 4 Views Right  Result Date: 01/14/2017 CLINICAL DATA:  Pain. EXAM: RIGHT KNEE - COMPLETE 4+ VIEW COMPARISON:  None. FINDINGS: No evidence of fracture, dislocation, or joint effusion. No evidence of arthropathy or other focal bone abnormality. Soft tissues are unremarkable.  IMPRESSION: Negative. Electronically Signed   By: Gerome Samavid  Williams III M.D   On: 01/14/2017 20:24    Procedures Procedures (including critical care time)  Medications Ordered in UC Medications - No data to display   Initial Impression / Assessment and Plan / UC Course  I have reviewed the triage vital signs and the nursing notes.  Pertinent labs & imaging results that were available during my care of the patient were reviewed by me and considered in my medical decision making (see chart for details).     Final Clinical Impressions(s) / UC Diagnoses   Final diagnoses:  Sprain of cruciate ligament of right knee, initial encounter    New Prescriptions New Prescriptions   DICLOFENAC (VOLTAREN) 75 MG EC TABLET    Take 1 tablet (75 mg total) by mouth 2 (two) times daily.     Elvina SidleLauenstein, Katrenia Alkins, MD 01/14/17 2037

## 2017-01-14 NOTE — ED Triage Notes (Signed)
Felt pop blow right knee on Tuesday while walking down steps.  Patient had been jump roping prior to this.  Patient continues to have pain in right leg, pain radiating down right leg from mid-thigh to heel

## 2017-01-14 NOTE — Discharge Instructions (Signed)
Call the orthopedic surgeon listed below for further evaluation tomorrow.

## 2017-01-20 ENCOUNTER — Emergency Department (HOSPITAL_COMMUNITY): Payer: Self-pay

## 2017-01-20 ENCOUNTER — Emergency Department (HOSPITAL_COMMUNITY)
Admission: EM | Admit: 2017-01-20 | Discharge: 2017-01-20 | Disposition: A | Discharge status: Home / Self Care | Payer: Self-pay | Attending: Emergency Medicine | Admitting: Emergency Medicine

## 2017-01-20 ENCOUNTER — Encounter (HOSPITAL_COMMUNITY): Payer: Self-pay | Admitting: *Deleted

## 2017-01-20 DIAGNOSIS — Z79899 Other long term (current) drug therapy: Secondary | ICD-10-CM | POA: Insufficient documentation

## 2017-01-20 DIAGNOSIS — N920 Excessive and frequent menstruation with regular cycle: Secondary | ICD-10-CM | POA: Insufficient documentation

## 2017-01-20 DIAGNOSIS — J45909 Unspecified asthma, uncomplicated: Secondary | ICD-10-CM | POA: Insufficient documentation

## 2017-01-20 LAB — COMPREHENSIVE METABOLIC PANEL
ALBUMIN: 4.3 g/dL (ref 3.5–5.0)
ALT: 12 U/L — AB (ref 14–54)
ANION GAP: 10 (ref 5–15)
AST: 22 U/L (ref 15–41)
Alkaline Phosphatase: 63 U/L (ref 38–126)
BUN: 10 mg/dL (ref 6–20)
CALCIUM: 9.1 mg/dL (ref 8.9–10.3)
CO2: 19 mmol/L — ABNORMAL LOW (ref 22–32)
Chloride: 110 mmol/L (ref 101–111)
Creatinine, Ser: 0.8 mg/dL (ref 0.44–1.00)
GFR calc non Af Amer: >60 mL/min (ref >60)
GLUCOSE: 163 mg/dL — AB (ref 65–99)
POTASSIUM: 3.1 mmol/L — AB (ref 3.5–5.1)
SODIUM: 139 mmol/L (ref 135–145)
Total Bilirubin: 0.6 mg/dL (ref 0.3–1.2)
Total Protein: 7.7 g/dL (ref 6.5–8.1)

## 2017-01-20 LAB — URINALYSIS, ROUTINE W REFLEX MICROSCOPIC
BILIRUBIN URINE: NEGATIVE
Bacteria, UA: NONE SEEN
KETONES UR: 20 mg/dL — AB
Leukocytes, UA: NEGATIVE
NITRITE: NEGATIVE
PROTEIN: 30 mg/dL — AB
Specific Gravity, Urine: 1.02 (ref 1.005–1.030)
pH: 7 (ref 5.0–8.0)

## 2017-01-20 LAB — POC URINE PREG, ED: Preg Test, Ur: NEGATIVE

## 2017-01-20 LAB — CBC
HCT: 36.9 % (ref 36.0–46.0)
HEMOGLOBIN: 12.4 g/dL (ref 12.0–15.0)
MCH: 30.3 pg (ref 26.0–34.0)
MCHC: 33.6 g/dL (ref 30.0–36.0)
MCV: 90.2 fL (ref 78.0–100.0)
Platelets: 330 10*3/uL (ref 150–400)
RBC: 4.09 MIL/uL (ref 3.87–5.11)
RDW: 12.7 % (ref 11.5–15.5)
WBC: 16.1 10*3/uL — ABNORMAL HIGH (ref 4.0–10.5)

## 2017-01-20 LAB — LIPASE, BLOOD: Lipase: 19 U/L (ref 11–51)

## 2017-01-20 MED ORDER — IBUPROFEN 600 MG PO TABS: 600.0000 mg | ORAL_TABLET | Freq: Four times a day (QID) | ORAL | 0 refills | Status: DC | PRN

## 2017-01-20 MED ORDER — SODIUM CHLORIDE 0.9 % IV BOLUS (SEPSIS)
1000.0000 mL | Freq: Once | INTRAVENOUS | Status: AC
  Administered 2017-01-20: 1000 mL via INTRAVENOUS

## 2017-01-20 MED ORDER — MORPHINE SULFATE (PF) 2 MG/ML IV SOLN
4.0000 mg | Freq: Once | INTRAVENOUS | Status: AC
  Administered 2017-01-20: 4 mg via INTRAVENOUS
  Filled 2017-01-20: qty 2

## 2017-01-20 MED ORDER — ONDANSETRON 4 MG PO TBDP: 4.0000 mg | ORAL_TABLET | Freq: Three times a day (TID) | ORAL | 0 refills | Status: DC | PRN

## 2017-01-20 MED ORDER — KETOROLAC TROMETHAMINE 30 MG/ML IJ SOLN
30.0000 mg | Freq: Once | INTRAMUSCULAR | Status: AC
  Administered 2017-01-20: 30 mg via INTRAVENOUS
  Filled 2017-01-20: qty 1

## 2017-01-20 MED ORDER — ONDANSETRON HCL 4 MG/2ML IJ SOLN
4.0000 mg | Freq: Once | INTRAMUSCULAR | Status: AC
  Administered 2017-01-20: 4 mg via INTRAVENOUS
  Filled 2017-01-20: qty 2

## 2017-01-20 NOTE — ED Notes (Signed)
Patient states she has tried to use the bathroom and can not.

## 2017-01-20 NOTE — Discharge Instructions (Signed)
Please read and follow all provided instructions.  Your diagnoses today include:  1. Menorrhagia with regular cycle     Tests performed today include: Vital signs. See below for your results today.   Medications prescribed:  Take as prescribed   Home care instructions:  Follow any educational materials contained in this packet.  Follow-up instructions: Please follow-up with your primary care provider for further evaluation of symptoms and treatment   Return instructions:  Please return to the Emergency Department if you do not get better, if you get worse, or new symptoms OR  - Fever (temperature greater than 101.50F)  - Bleeding that does not stop with holding pressure to the area    -Severe pain (please note that you may be more sore the day after your accident)  - Chest Pain  - Difficulty breathing  - Severe nausea or vomiting  - Inability to tolerate food and liquids  - Passing out  - Skin becoming red around your wounds  - Change in mental status (confusion or lethargy)  - New numbness or weakness    Please return if you have any other emergent concerns.  Additional Information:  Your vital signs today were: BP 109/69    Pulse 65    Temp 98 F (36.7 C) (Oral)    Resp 16    Ht 5\' 3"  (1.6 m)    Wt 77.1 kg (170 lb)    SpO2 98%    BMI 30.11 kg/m  If your blood pressure (BP) was elevated above 135/85 this visit, please have this repeated by your doctor within one month. ---------------

## 2017-01-20 NOTE — ED Provider Notes (Signed)
[facility]-EMERGENCY DEPT Provider Note   CSN: [id number] Arrival date & time: 01/20/17  1330     History   Chief Complaint Chief Complaint  Patient presents with  . Abdominal Pain  . Emesis    HPI Brandi [name] is a 21 y.o. female.  HPI  21 y.o. female with a hx of Asthma, presents to the Emergency Department today complaining of lower abdominal pain with onset this morning around 0600. Noted hx same. Pt states her menstrual cycle began today and she usually gets this pain. States this is worse than before. Notes N/V. No diarrhea. No CP/SOB. Notes vaginal bleeding. No discharge. Pt notes regular cycles. Pt is sexually active with one partner. Rates pain 10/10 and cramping sensation bilateral lower abdomen. No meds PTA. No other symptoms noted.    Past Medical History:  Diagnosis Date  . Asthma     There are no active problems to display for this patient.   History reviewed. No pertinent surgical history.  OB History    Gravida Para Term Preterm AB Living   0 0 0 0 0 0   SAB TAB Ectopic Multiple Live Births   0 0 0 0         Home Medications    Prior to Admission medications   Medication Sig Start Date End Date Taking? Authorizing Provider  acetaminophen (TYLENOL) 325 MG tablet Take 650 mg by mouth every 6 (six) hours as needed for mild pain.    [provider]  diclofenac (VOLTAREN) 75 MG EC tablet Take 1 tablet (75 mg total) by mouth 2 (two) times daily. 01/14/17   Elvina Sidle, MD  promethazine (PHENERGAN) 12.5 MG tablet Take 1 tablet (12.5 mg total) by mouth every 6 (six) hours as needed for nausea or vomiting. 03/06/16   Kirichenko, Tatyana, PA-C  Triprolidine-Pseudoephedrine (ANTIHISTAMINE PO) Take 3 tablets by mouth daily as needed (allergies).    [provider]    Family History No family history on file.  Social History Social History  Substance Use Topics  . Smoking status: Never Smoker  . Smokeless tobacco: Never Used  . Alcohol use  No     Allergies   Shellfish allergy   Review of Systems Review of Systems ROS reviewed and all are negative for acute change except as noted in the HPI.  Physical Exam Updated Vital Signs BP (!) 149/95 (BP Location: Left Arm)   Pulse 98   Temp 98 F (36.7 C) (Oral)   Resp 18   Ht 5\' 3"  (1.6 m)   Wt 77.1 kg (170 lb)   SpO2 100%   BMI 30.11 kg/m   Physical Exam  Constitutional: She is oriented to person, place, and time. Vital signs are normal. She appears well-developed and well-nourished.  Appears uncomfortable. Actively vomiting.   HENT:  Head: Normocephalic and atraumatic.  Right Ear: Hearing normal.  Left Ear: Hearing normal.  Eyes: Conjunctivae and EOM are normal. Pupils are equal, round, and reactive to light.  Neck: Normal range of motion. Neck supple.  Cardiovascular: Normal rate, regular rhythm, normal heart sounds and intact distal pulses.   Pulmonary/Chest: Effort normal and breath sounds normal.  Abdominal: Soft. Normal appearance and bowel sounds are normal. There is tenderness in the right lower quadrant, suprapubic area and left lower quadrant. There is no rigidity, no rebound, no guarding, no CVA tenderness, no tenderness at McBurney's point and negative Murphy's sign.  Abdomen soft  Musculoskeletal: Normal range of motion.  Neurological: She  is alert and oriented to person, place, and time.  Skin: Skin is warm and dry.  Psychiatric: She has a normal mood and affect. Her speech is normal and behavior is normal. Thought content normal.  Nursing note and vitals reviewed.  ED Treatments / Results  Labs (all labs ordered are listed, but only abnormal results are displayed) Labs Reviewed  COMPREHENSIVE METABOLIC PANEL - Abnormal; Notable for the following:       Result Value   Potassium 3.1 (*)    CO2 19 (*)    Glucose, Bld 163 (*)    ALT 12 (*)    All other components within normal limits  CBC - Abnormal; Notable for the following:    WBC 16.1 (*)      All other components within normal limits  URINALYSIS, ROUTINE W REFLEX MICROSCOPIC - Abnormal; Notable for the following:    APPearance HAZY (*)    Glucose, UA >=500 (*)    Hgb urine dipstick LARGE (*)    Ketones, ur 20 (*)    Protein, ur 30 (*)    Squamous Epithelial / LPF 0-5 (*)    All other components within normal limits  LIPASE, BLOOD  POC URINE PREG, ED    EKG  EKG Interpretation None       Radiology [place] Pelvis Complete  Result Date: 01/20/2017 CLINICAL DATA:  Acute pelvic pain. The patient refused a transvaginal pelvic ultrasound. EXAM: TRANSABDOMINAL ULTRASOUND OF PELVIS DOPPLER ULTRASOUND OF OVARIES TECHNIQUE: Transabdominal ultrasound examination of the pelvis was performed including evaluation of the uterus, ovaries, adnexal regions, and pelvic cul-de-sac. Color and duplex Doppler ultrasound was utilized to evaluate blood flow to the ovaries. COMPARISON:  Pelvis CT dated 03/06/2016. FINDINGS: Uterus Measurements: 8.7 x 4.5 x 3.9 cm. No fibroids or other mass visualized. Endometrium Thickness: 6.3 mm. No focal abnormality visualized. Right ovary Measurements: 2.5 x 1.7 x 1.6 cm. Normal appearance/no adnexal mass. Left ovary Measurements: 2.1 x 1.8 x 1.2 cm. Normal appearance/no adnexal mass. Pulsed Doppler evaluation demonstrates normal low-resistance arterial and venous waveforms in both ovaries. IMPRESSION: Normal examination, limited by the lack of transvaginal images due to patient refusal. Electronically Signed   By: Beckie SaltsSteven  Reid M.D.   On: 01/20/2017 15:16   [place] Art/ven Flow Abd Pelv Doppler  Result Date: 01/20/2017 CLINICAL DATA:  Acute pelvic pain. The patient refused a transvaginal pelvic ultrasound. EXAM: TRANSABDOMINAL ULTRASOUND OF PELVIS DOPPLER ULTRASOUND OF OVARIES TECHNIQUE: Transabdominal ultrasound examination of the pelvis was performed including evaluation of the uterus, ovaries, adnexal regions, and pelvic cul-de-sac. Color and duplex Doppler ultrasound  was utilized to evaluate blood flow to the ovaries. COMPARISON:  Pelvis CT dated 03/06/2016. FINDINGS: Uterus Measurements: 8.7 x 4.5 x 3.9 cm. No fibroids or other mass visualized. Endometrium Thickness: 6.3 mm. No focal abnormality visualized. Right ovary Measurements: 2.5 x 1.7 x 1.6 cm. Normal appearance/no adnexal mass. Left ovary Measurements: 2.1 x 1.8 x 1.2 cm. Normal appearance/no adnexal mass. Pulsed Doppler evaluation demonstrates normal low-resistance arterial and venous waveforms in both ovaries. IMPRESSION: Normal examination, limited by the lack of transvaginal images due to patient refusal. Electronically Signed   By: Beckie SaltsSteven  Reid M.D.   On: 01/20/2017 15:16    Procedures Procedures (including critical care time)  Medications Ordered in ED Medications  sodium chloride 0.9 % bolus 1,000 mL (1,000 mLs Intravenous New Bag/Given 01/20/17 1410)  morphine 2 MG/ML injection 4 mg (4 mg Intravenous Given 01/20/17 1411)  ondansetron (ZOFRAN) injection 4 mg (4  mg Intravenous Given 01/20/17 1411)  ketorolac (TORADOL) 30 MG/ML injection 30 mg (30 mg Intravenous Given 01/20/17 1411)     Initial Impression / Assessment and Plan / ED Course  I have reviewed the triage vital signs and the nursing notes.  Pertinent labs & imaging results that were available during my care of the patient were reviewed by me and considered in my medical decision making (see chart for details).  Final Clinical Impressions(s) / ED Diagnoses  {I have reviewed and evaluated the relevant laboratory values. {I have reviewed and evaluated the relevant imaging studies.  {I have reviewed the relevant previous healthcare records.  {I obtained HPI from historian.   ED Course:  Assessment: Patient is a 21 y.o. female presents with abdominal pain since 0600 this AM. Hx same. Noted onset of menstrual cycle with vaginal bleeding. No discharge. States worse than normal. Constat cramping pain. Noted N/V. On exam, nontoxic, nonseptic  appearing,appears uncomofrtable. Patient's pain and other symptoms adequately managed in emergency department.  Fluid bolus given.  Labs, imaging and vitals reviewed. Pelvic US unremarkable. UA unremarkable. Pt declined pelvic exam. Patient does not meet the SIRS or Sepsis criteria.  On repeat exam patient does not have a surgical abdomen and there are no peritoneal signs.  No indication of appendicitis, bowel obstruction, bowel perforation, cholecystitis, diverticulitis, PID or ectopic pregnancy. Symptoms consistent with menorrhagia. Noted hx same. Patient discharged home with symptomatic treatment and given strict instructions for follow-up with their primary care physician or GYN.  I have also discussed reasons to return immediately to the ER.  Patient expresses understanding and agrees with plan.  Disposition/Plan:  DC Home Additional Verbal discharge instructions given and discussed with patient.  Pt Instructed to f/u with GYN in the next week for evaluation and treatment of symptoms. Return precautions given Pt acknowledges and agrees with plan  Supervising Physician Shaune Pollack, MD  Final diagnoses:  Menorrhagia with regular cycle    New Prescriptions New Prescriptions   No medications on file     Audry Pili, Cordelia Poche 01/20/17 1639    Shaune Pollack, MD 01/21/17 [phone]

## 2017-01-20 NOTE — ED Notes (Signed)
Bed: [id number] Expected date:  Expected time:  Means of arrival:  Comments: 21 yo f n/v

## 2017-01-20 NOTE — ED Triage Notes (Signed)
Pt developed N/V during the  Night, continues to have nausea and vomiting, denies diarrhea.

## 2017-06-12 ENCOUNTER — Emergency Department
Admission: EM | Admit: 2017-06-12 | Discharge: 2017-06-12 | Disposition: A | Discharge status: Home / Self Care | Payer: Self-pay | Attending: Emergency Medicine | Admitting: Emergency Medicine

## 2017-06-12 ENCOUNTER — Encounter: Payer: Self-pay | Admitting: Emergency Medicine

## 2017-06-12 DIAGNOSIS — R112 Nausea with vomiting, unspecified: Secondary | ICD-10-CM | POA: Insufficient documentation

## 2017-06-12 DIAGNOSIS — R1013 Epigastric pain: Secondary | ICD-10-CM | POA: Insufficient documentation

## 2017-06-12 DIAGNOSIS — Z79899 Other long term (current) drug therapy: Secondary | ICD-10-CM | POA: Insufficient documentation

## 2017-06-12 DIAGNOSIS — J45909 Unspecified asthma, uncomplicated: Secondary | ICD-10-CM | POA: Insufficient documentation

## 2017-06-12 LAB — COMPREHENSIVE METABOLIC PANEL
ALBUMIN: 4.4 g/dL (ref 3.5–5.0)
ALK PHOS: 63 U/L (ref 38–126)
ALT: 15 U/L (ref 14–54)
AST: 23 U/L (ref 15–41)
Anion gap: 13 (ref 5–15)
BILIRUBIN TOTAL: 0.6 mg/dL (ref 0.3–1.2)
BUN: 13 mg/dL (ref 6–20)
CO2: 21 mmol/L — AB (ref 22–32)
Calcium: 9.5 mg/dL (ref 8.9–10.3)
Chloride: 106 mmol/L (ref 101–111)
Creatinine, Ser: 0.77 mg/dL (ref 0.44–1.00)
GFR calc Af Amer: >60 mL/min (ref >60)
GFR calc non Af Amer: >60 mL/min (ref >60)
GLUCOSE: 144 mg/dL — AB (ref 65–99)
Potassium: 3.8 mmol/L (ref 3.5–5.1)
SODIUM: 140 mmol/L (ref 135–145)
Total Protein: 8.4 g/dL — ABNORMAL HIGH (ref 6.5–8.1)

## 2017-06-12 LAB — URINALYSIS, COMPLETE (UACMP) WITH MICROSCOPIC
Bilirubin Urine: NEGATIVE
Glucose, UA: NEGATIVE mg/dL
KETONES UR: 20 mg/dL — AB
Leukocytes, UA: NEGATIVE
Nitrite: NEGATIVE
PH: 6 (ref 5.0–8.0)
Protein, ur: 30 mg/dL — AB
Specific Gravity, Urine: 1.023 (ref 1.005–1.030)

## 2017-06-12 LAB — CBC
HEMATOCRIT: 40 % (ref 35.0–47.0)
Hemoglobin: 13.2 g/dL (ref 12.0–16.0)
MCH: 30.3 pg (ref 26.0–34.0)
MCHC: 32.9 g/dL (ref 32.0–36.0)
MCV: 92 fL (ref 80.0–100.0)
Platelets: 323 10*3/uL (ref 150–440)
RBC: 4.35 MIL/uL (ref 3.80–5.20)
RDW: 12.9 % (ref 11.5–14.5)
WBC: 13.3 10*3/uL — ABNORMAL HIGH (ref 3.6–11.0)

## 2017-06-12 LAB — LIPASE, BLOOD: Lipase: 15 U/L (ref 11–51)

## 2017-06-12 MED ORDER — SODIUM CHLORIDE 0.9 % IV BOLUS (SEPSIS)
1000.0000 mL | Freq: Once | INTRAVENOUS | Status: AC
  Administered 2017-06-12: 1000 mL via INTRAVENOUS

## 2017-06-12 MED ORDER — ONDANSETRON HCL 4 MG/2ML IJ SOLN
4.0000 mg | Freq: Once | INTRAMUSCULAR | Status: AC | PRN
  Administered 2017-06-12: 4 mg via INTRAVENOUS
  Filled 2017-06-12: qty 2

## 2017-06-12 MED ORDER — ONDANSETRON 4 MG PO TBDP: 4.0000 mg | ORAL_TABLET | Freq: Three times a day (TID) | ORAL | 0 refills | Status: DC | PRN

## 2017-06-12 NOTE — ED Triage Notes (Signed)
States upper abd pain, nausea and vomiting began this am. States has had frequent episodes of same over past 6 years. Actively vomiting in triage.

## 2017-06-12 NOTE — ED Notes (Signed)
ED Provider at bedside. 

## 2017-06-12 NOTE — ED Notes (Signed)
Pt. Going home with friend 

## 2017-06-12 NOTE — ED Notes (Signed)
Suprapubic exam completed by Dr.Paduchowski and Aundra Millet RN

## 2017-06-12 NOTE — ED Notes (Signed)
Negative on poct pregnancy.

## 2017-06-12 NOTE — ED Provider Notes (Signed)
[facility] Emergency Department Provider Note  Time seen: 4:18 PM  I have reviewed the triage vital signs and the nursing notes.   HISTORY  Chief Complaint Emesis and Abdominal Pain    HPI Brandi [name] is a 21 y.o. female with a past medical history of asthma who presents to the emergency department for nausea vomiting up her abdominal discomfort. According to the patient since last night she has been nauseated with vomiting with mild upper abdominal discomfort worse with vomiting. States upon arriving to the emergency department she received IV Zofran, since that time she states her abdominal pain is completely gone, no longer feels nauseated. As a secondary complaint the patient states she has noticed a bump to her groin area she would like evaluated. Denies any fever, dysuria. States several episodes of diarrhea last night and this morning.  Past Medical History:  Diagnosis Date  . Asthma     There are no active problems to display for this patient.   History reviewed. No pertinent surgical history.  Prior to Admission medications   Medication Sig Start Date End Date Taking? Authorizing Provider  acetaminophen (TYLENOL) 325 MG tablet Take 650 mg by mouth every 6 (six) hours as needed for mild pain.    [provider]  diclofenac (VOLTAREN) 75 MG EC tablet Take 1 tablet (75 mg total) by mouth 2 (two) times daily. Patient not taking: Reported on 01/20/2017 01/14/17   Elvina Sidle, MD  ibuprofen (ADVIL,MOTRIN) 600 MG tablet Take 1 tablet (600 mg total) by mouth every 6 (six) hours as needed. 01/20/17   Audry Pili, PA-C  ondansetron (ZOFRAN ODT) 4 MG disintegrating tablet Take 1 tablet (4 mg total) by mouth every 8 (eight) hours as needed for nausea or vomiting. 01/20/17   Audry Pili, PA-C  Triprolidine-Pseudoephedrine (ANTIHISTAMINE PO) Take 3 tablets by mouth daily as needed (allergies).    [provider]    Allergies  Allergen  Reactions  . Shellfish Allergy Anaphylaxis    No family history on file.  Social History Social History  Substance Use Topics  . Smoking status: Never Smoker  . Smokeless tobacco: Never Used  . Alcohol use No    Review of Systems Constitutional: Negative for fever. Cardiovascular: Negative for chest pain. Respiratory: Negative for shortness of breath. Gastrointestinal: Mild upper abdominal discomfort, now resolved. Positive for nausea vomiting and diarrhea, now resolved. Genitourinary: Negative for dysuria. Neurological: Negative for headache All other ROS negative  ____________________________________________   PHYSICAL EXAM:  VITAL SIGNS: ED Triage Vitals  Enc Vitals Group     BP 06/12/17 1312 (!) 156/107     Pulse Rate 06/12/17 1312 (!) 110     Resp 06/12/17 1312 (!) 0     Temp 06/12/17 1312 97.9 F (36.6 C)     Temp Source 06/12/17 1312 Oral     SpO2 06/12/17 1312 100 %     Weight 06/12/17 1313 165 lb (74.8 kg)     Height 06/12/17 1313  (1.575 m)     Head Circumference --      Peak Flow --      Pain Score 06/12/17 1312 10     Pain Loc --      Pain Edu? --      Excl. in GC? --     Constitutional: Alert and oriented. Well appearing and in no distress. Eyes: Normal exam ENT   Head: Normocephalic and atraumatic.   Mouth/Throat: Mucous membranes are moist. Cardiovascular:  Normal rate, regular rhythm. No murmur Respiratory: Normal respiratory effort without tachypnea nor retractions. Breath sounds are clear  Gastrointestinal: Soft and nontender. No distention. Musculoskeletal: Nontender with normal range of motion in all extremities.  Neurologic:  Normal speech and language. No gross focal neurologic deficits  Skin:  Skin is warm, dry and intact.  Psychiatric: Mood and affect are normal.   ____________________________________________   INITIAL IMPRESSION / ASSESSMENT AND PLAN / ED COURSE  Pertinent labs & imaging results that were available  during my care of the patient were reviewed by me and considered in my medical decision making (see chart for details).  Patient presents to the emergency department for nausea vomiting up her abdominal discomfort and diarrhea. Differential this time included gastritis, biliary dysfunction, pancreatitis, enteritis. Patient's labs show a mild leukocytosis of 13,000, otherwise within normal limits including a normal creatinine and an anion gap of 13. Lipase is negative. Patient states her abdominal pain is completely resolved after receiving Zofran. She has a completely nontender abdomen on my exam. We will treat with IV fluids, 10 a urinalysis as well as a point of care pregnancy test. As a secondary complaint patient also is reporting a tender area to her groin she would like evaluated.  On examination patient has 2 small areas in her pubic area consistent with ingrown hairs. Largely nontender with no signs of active infection. Patient states similar bumps will pop up and resolve on the ground. Discussed with the patient not shaving versus using new razors, etc.  I reviewed the patient's records including her last ER visit and may in which she was seen for similar issues of abdominal pain and vomiting. Patient's workup today is largely nonrevealing, urinalysis pending. Remains abdominal pain-free.  Patient states she is feeling normal and is wishing to be discharged home. Urinalysis is largely normal besides a mild amount of ketones. Nurse reports the point of care pregnancy test is negative. We will discharge the patient home  ____________________________________________   FINAL CLINICAL IMPRESSION(S) / ED DIAGNOSES  Nausea vomiting diarrhea Epigastric pain    Minna Antis, MD 06/12/17 [phone]

## 2018-06-26 IMAGING — DX DG KNEE COMPLETE 4+V*R*
4 series · 4 of 4 positions shown · non-contrast
Comparison: None.

CLINICAL DATA: Pain.

EXAM:
RIGHT KNEE - COMPLETE 4+ VIEW

[knee ap]
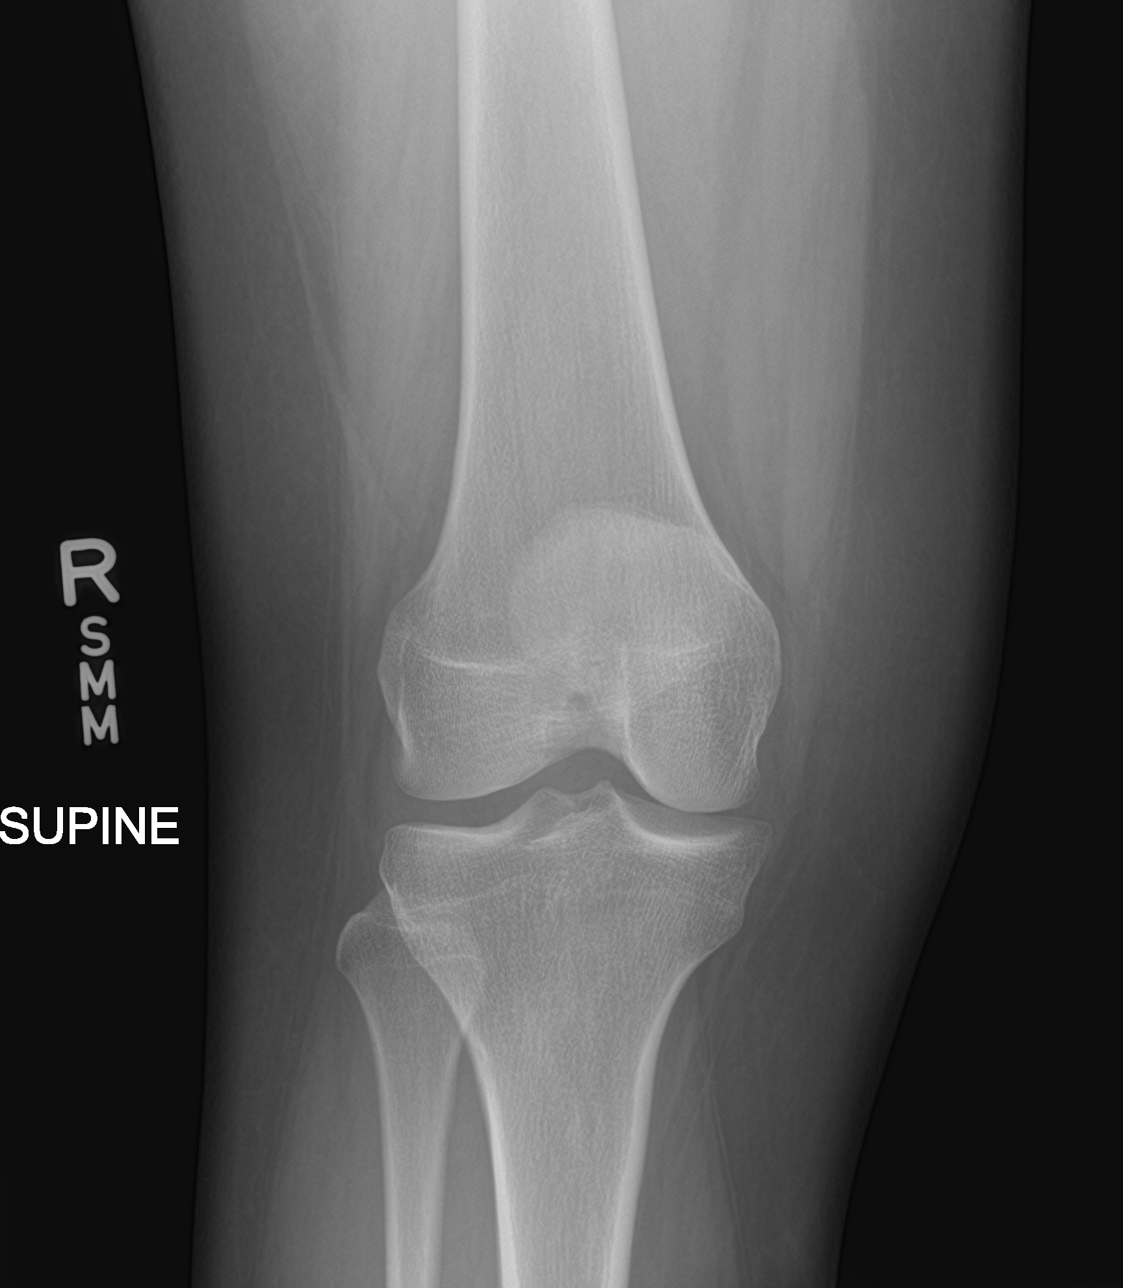

[knee obl (1 of 2)]
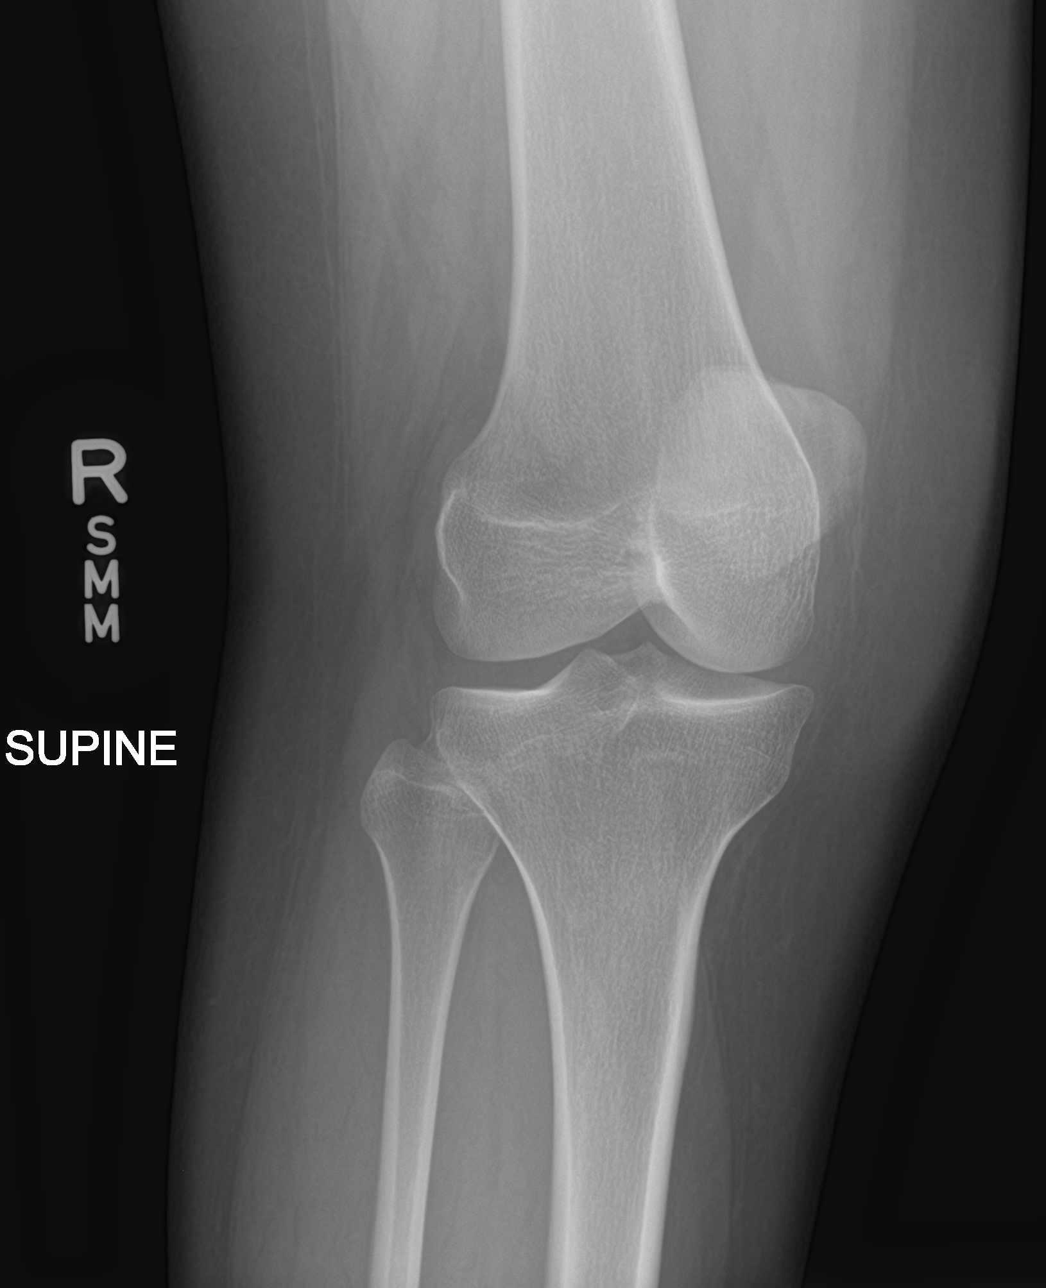

[knee obl (2 of 2)]
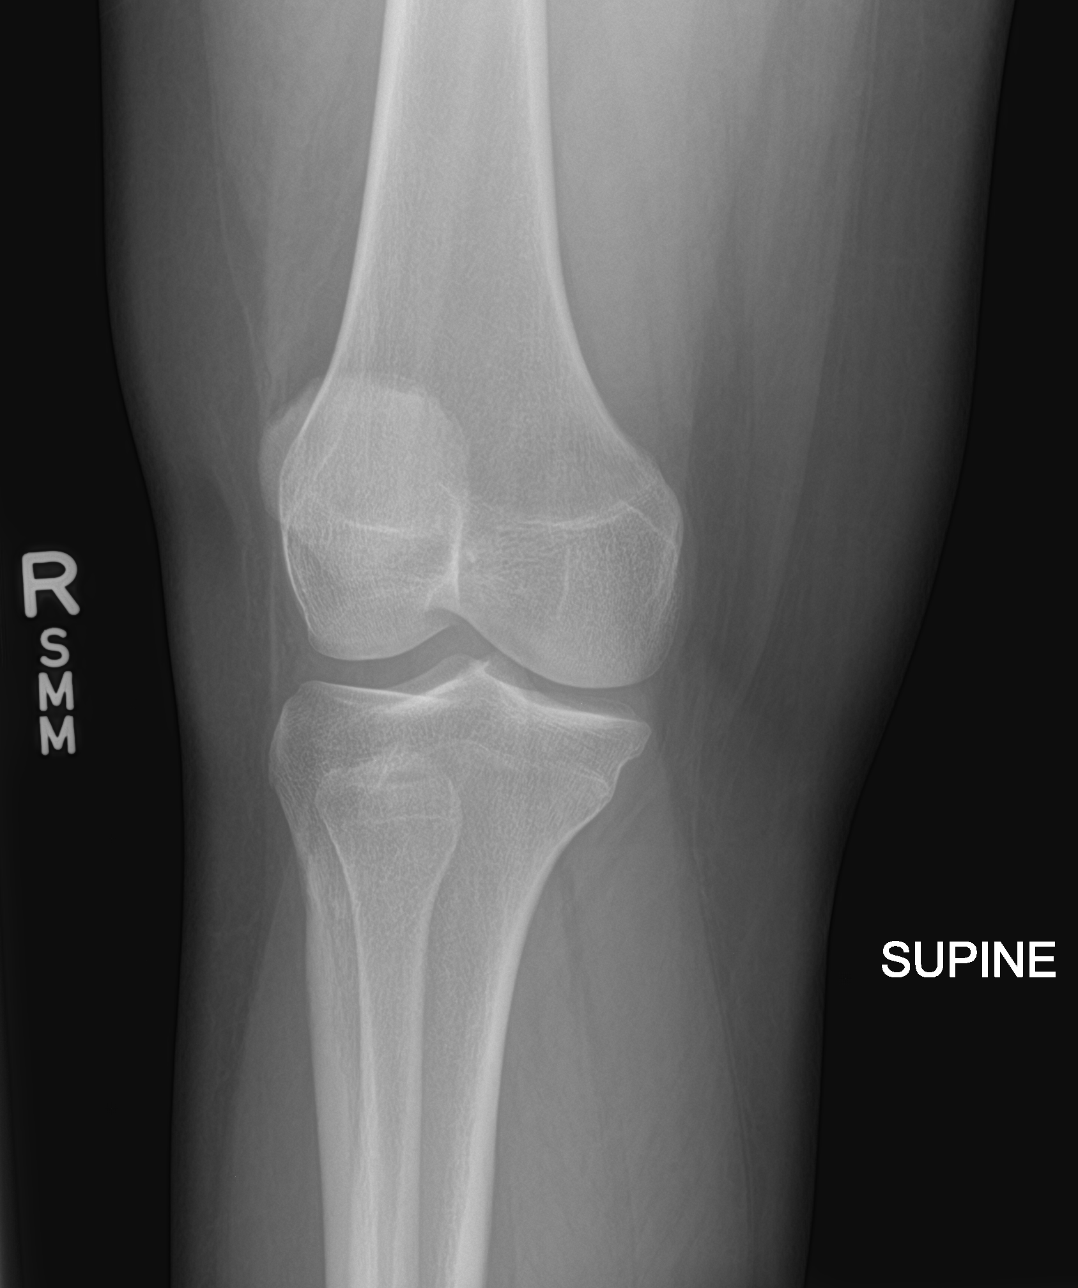

[knee lat]
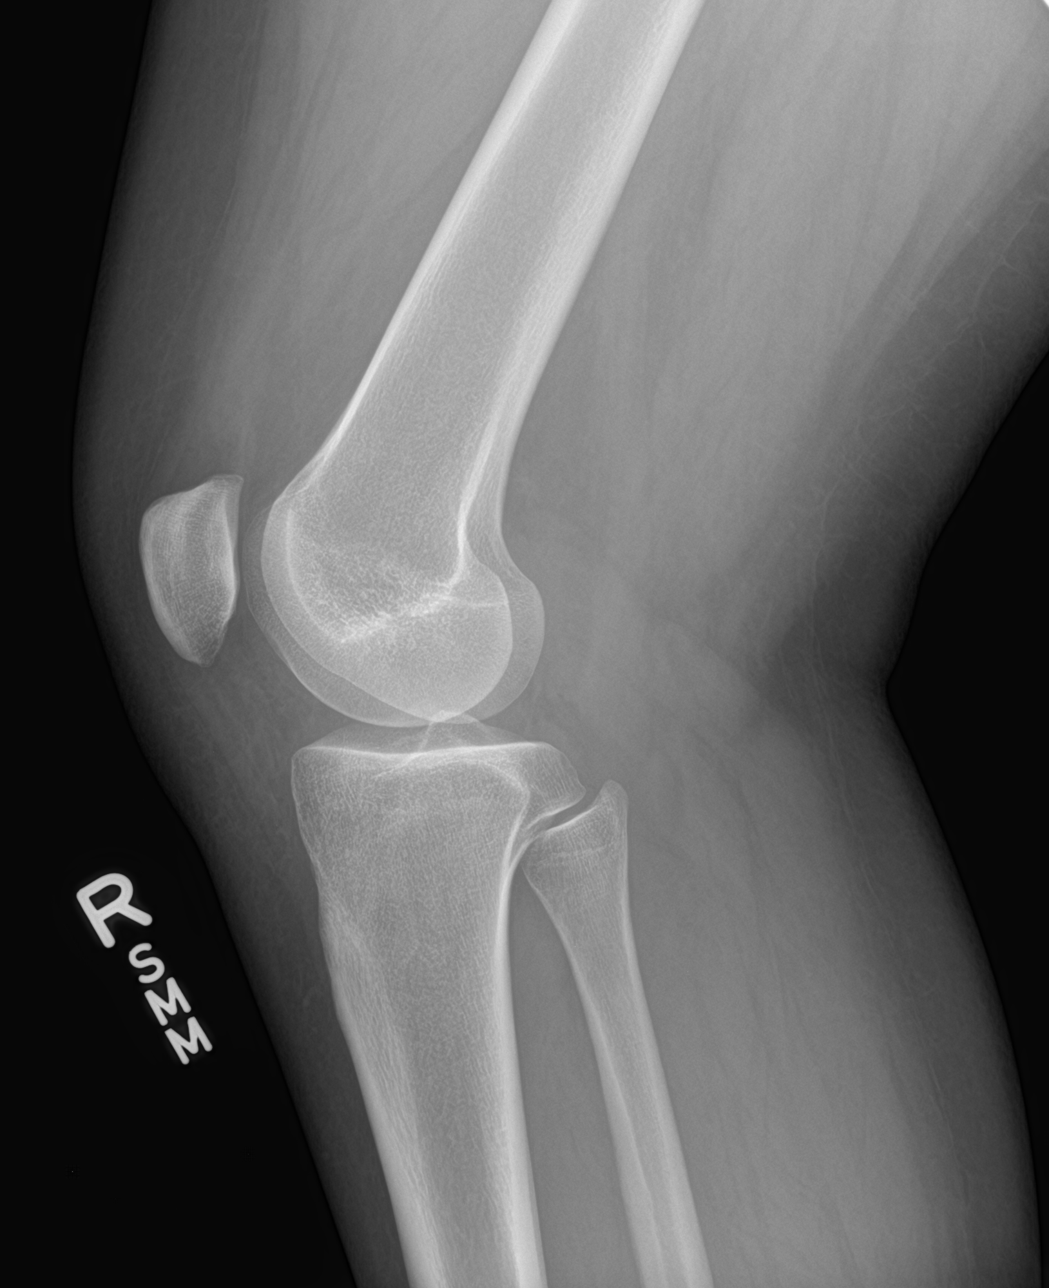

[4 of 4 positions shown; findings below may reference images not displayed]

FINDINGS: No evidence of fracture, dislocation, or joint effusion. No evidence
of arthropathy or other focal bone abnormality. Soft tissues are
unremarkable.
IMPRESSION: Negative.

## 2018-08-22 ENCOUNTER — Emergency Department (HOSPITAL_COMMUNITY): Payer: Self-pay

## 2018-08-22 ENCOUNTER — Emergency Department (HOSPITAL_COMMUNITY)
Admission: EM | Admit: 2018-08-22 | Discharge: 2018-08-22 | Disposition: A | Discharge status: Home / Self Care | Payer: Self-pay | Attending: Emergency Medicine | Admitting: Emergency Medicine

## 2018-08-22 ENCOUNTER — Encounter (HOSPITAL_COMMUNITY): Payer: Self-pay

## 2018-08-22 ENCOUNTER — Other Ambulatory Visit: Payer: Self-pay

## 2018-08-22 DIAGNOSIS — N3000 Acute cystitis without hematuria: Secondary | ICD-10-CM | POA: Insufficient documentation

## 2018-08-22 DIAGNOSIS — R112 Nausea with vomiting, unspecified: Secondary | ICD-10-CM | POA: Insufficient documentation

## 2018-08-22 DIAGNOSIS — R10816 Epigastric abdominal tenderness: Secondary | ICD-10-CM | POA: Insufficient documentation

## 2018-08-22 DIAGNOSIS — R1084 Generalized abdominal pain: Secondary | ICD-10-CM | POA: Insufficient documentation

## 2018-08-22 LAB — I-STAT BETA HCG BLOOD, ED (MC, WL, AP ONLY)

## 2018-08-22 LAB — CBC
HEMATOCRIT: 39.2 % (ref 36.0–46.0)
HEMOGLOBIN: 12.6 g/dL (ref 12.0–15.0)
MCH: 30.1 pg (ref 26.0–34.0)
MCHC: 32.1 g/dL (ref 30.0–36.0)
MCV: 93.8 fL (ref 80.0–100.0)
Platelets: 316 10*3/uL (ref 150–400)
RBC: 4.18 MIL/uL (ref 3.87–5.11)
RDW: 12.6 % (ref 11.5–15.5)
WBC: 16.9 10*3/uL — ABNORMAL HIGH (ref 4.0–10.5)
nRBC: 0 % (ref 0.0–0.2)

## 2018-08-22 LAB — URINALYSIS, ROUTINE W REFLEX MICROSCOPIC
BILIRUBIN URINE: NEGATIVE
Glucose, UA: 150 mg/dL — AB
Ketones, ur: 20 mg/dL — AB
Nitrite: NEGATIVE
PROTEIN: 30 mg/dL — AB
RBC / HPF: >50 RBC/hpf — ABNORMAL HIGH (ref 0–5)
Specific Gravity, Urine: 1.027 (ref 1.005–1.030)
pH: 6 (ref 5.0–8.0)

## 2018-08-22 LAB — COMPREHENSIVE METABOLIC PANEL
ALBUMIN: 3.9 g/dL (ref 3.5–5.0)
ALT: 13 U/L (ref 0–44)
AST: 25 U/L (ref 15–41)
Alkaline Phosphatase: 59 U/L (ref 38–126)
Anion gap: 15 (ref 5–15)
BUN: 8 mg/dL (ref 6–20)
CO2: 15 mmol/L — AB (ref 22–32)
CREATININE: 0.88 mg/dL (ref 0.44–1.00)
Calcium: 9.2 mg/dL (ref 8.9–10.3)
Chloride: 109 mmol/L (ref 98–111)
GFR calc Af Amer: >60 mL/min (ref >60)
Glucose, Bld: 165 mg/dL — ABNORMAL HIGH (ref 70–99)
POTASSIUM: 3.6 mmol/L (ref 3.5–5.1)
SODIUM: 139 mmol/L (ref 135–145)
Total Bilirubin: 0.4 mg/dL (ref 0.3–1.2)
Total Protein: 7.3 g/dL (ref 6.5–8.1)

## 2018-08-22 LAB — LIPASE, BLOOD: Lipase: 22 U/L (ref 11–51)

## 2018-08-22 MED ORDER — MORPHINE SULFATE (PF) 4 MG/ML IV SOLN
4.0000 mg | Freq: Once | INTRAVENOUS | Status: AC
  Administered 2018-08-22: 4 mg via INTRAVENOUS
  Filled 2018-08-22: qty 1

## 2018-08-22 MED ORDER — ONDANSETRON HCL 4 MG/2ML IJ SOLN
4.0000 mg | Freq: Once | INTRAMUSCULAR | Status: AC
  Administered 2018-08-22: 4 mg via INTRAVENOUS
  Filled 2018-08-22: qty 2

## 2018-08-22 MED ORDER — ONDANSETRON 4 MG PO TBDP: 4.0000 mg | ORAL_TABLET | Freq: Three times a day (TID) | ORAL | 0 refills | Status: DC | PRN

## 2018-08-22 MED ORDER — SODIUM CHLORIDE 0.9 % IV BOLUS
1000.0000 mL | Freq: Once | INTRAVENOUS | Status: AC
  Administered 2018-08-22: 1000 mL via INTRAVENOUS

## 2018-08-22 MED ORDER — CEPHALEXIN 500 MG PO CAPS: 500.0000 mg | ORAL_CAPSULE | Freq: Two times a day (BID) | ORAL | 0 refills | Status: AC

## 2018-08-22 MED ORDER — IOHEXOL 300 MG/ML  SOLN
100.0000 mL | Freq: Once | INTRAMUSCULAR | Status: AC | PRN
  Administered 2018-08-22: 100 mL via INTRAVENOUS

## 2018-08-22 NOTE — ED Triage Notes (Signed)
Pt reports she has been vomiting blood X2 hours. She reports abdominal pain. Pt is currently on her menstrual cycle.

## 2018-08-22 NOTE — ED Notes (Signed)
Pt updated with CT scan time assessment

## 2018-08-22 NOTE — ED Notes (Signed)
Patient transported to CT 

## 2018-08-22 NOTE — ED Notes (Signed)
Pt given ginger ale for PO challenge. Will recheck in 10 minutes.

## 2018-08-22 NOTE — ED Notes (Signed)
Patient verbalizes understanding of discharge instructions. Opportunity for questioning and answers were provided. Armband removed by staff, pt discharged from ED.  

## 2018-08-22 NOTE — ED Provider Notes (Signed)
[facility]ENC[facility] Provider Note   CSN: [id number] Arrival date & time: 08/22/18  1435   History   Chief Complaint Chief Complaint  Patient presents with  . Emesis    Brandi [name] is a 22 y.o. female with past medical history significant for menorrhagia who presents for evaluation of emesis and abdominal pain. Has history of the same which correlates with her menstrual cycle.  States she has had approximately 20 episodes of emesis over the last 2 days.  Patient states when she is throwing up today she has noted blood streaks of blood in her emesis.  She states she has history of similar symptoms when she vomits.  States she has been told previously she needs to follow-up with OB/GYN for her recurrent pelvic pain, however has not done this. Denies fever, chills, headache, chest pain, shortness of breath, pelvic pain, diarrhea, constipation, vaginal discharge, dysuria.  Patient states his abdominal pain located to the epigastric region.  Rates his pain a 7/10.  Pain does not radiate.  History obtained from patient.  No interpreter was used.  HPI  Past Medical History:  Diagnosis Date  . Asthma     There are no active problems to display for this patient.   History reviewed. No pertinent surgical history.   OB History    Gravida  0   Para  0   Term  0   Preterm  0   AB  0   Living  0     SAB  0   TAB  0   Ectopic  0   Multiple  0   Live Births               Home Medications    Prior to Admission medications   Medication Sig Start Date End Date Taking? Authorizing Provider  acetaminophen (TYLENOL) 325 MG tablet Take 650 mg by mouth every 6 (six) hours as needed for mild pain.    [provider]  cephALEXin (KEFLEX) 500 MG capsule Take 1 capsule (500 mg total) by mouth 2 (two) times daily for 7 days. 08/22/18 08/29/18  Khyson Sebesta A, PA-C  diclofenac (VOLTAREN) 75 MG EC tablet Take 1 tablet (75 mg total) by  mouth 2 (two) times daily. Patient not taking: Reported on 01/20/2017 01/14/17   Elvina SidleLauenstein, Kurt, MD  ibuprofen (ADVIL,MOTRIN) 600 MG tablet Take 1 tablet (600 mg total) by mouth every 6 (six) hours as needed. 01/20/17   Audry PiliMohr, Tyler, PA-C  ondansetron (ZOFRAN ODT) 4 MG disintegrating tablet Take 1 tablet (4 mg total) by mouth every 8 (eight) hours as needed for nausea or vomiting. 08/22/18   Elisia Stepp A, PA-C  Triprolidine-Pseudoephedrine (ANTIHISTAMINE PO) Take 3 tablets by mouth daily as needed (allergies).    [provider]    Family History History reviewed. No pertinent family history.  Social History Social History   Tobacco Use  . Smoking status: Never Smoker  . Smokeless tobacco: Never Used  Substance Use Topics  . Alcohol use: No  . Drug use: Yes    Types: Marijuana     Allergies   Shellfish allergy   Review of Systems Review of Systems  Constitutional: Negative.   HENT: Negative.   Respiratory: Negative.   Cardiovascular: Negative.   Gastrointestinal: Positive for abdominal pain, nausea and vomiting. Negative for abdominal distention, anal bleeding, blood in stool, constipation, diarrhea and rectal pain.       Blood tinged emesis.  Genitourinary:  Negative.   Musculoskeletal: Negative.   Skin: Negative.   Neurological: Negative.   Hematological: Negative.   All other systems reviewed and are negative.    Physical Exam Updated Vital Signs BP 130/83 (BP Location: Right Arm)   Pulse 70   Temp 98.2 F (36.8 C) (Oral)   Resp 17   LMP 08/21/2018 (Exact Date)   SpO2 98%   Physical Exam Vitals signs and nursing note reviewed.  Constitutional:      General: She is not in acute distress.    Appearance: She is well-developed. She is not ill-appearing, toxic-appearing or diaphoretic.  HENT:     Head: Normocephalic and atraumatic.     Nose: Nose normal. No congestion or rhinorrhea.     Mouth/Throat:     Mouth: Mucous membranes are dry.  Eyes:      Pupils: Pupils are equal, round, and reactive to light.  Neck:     Musculoskeletal: Normal range of motion.  Cardiovascular:     Rate and Rhythm: Normal rate.     Heart sounds: Normal heart sounds. No murmur. No friction rub. No gallop.   Pulmonary:     Effort: Pulmonary effort is normal. No respiratory distress.     Breath sounds: Normal breath sounds. No stridor. No wheezing, rhonchi or rales.  Chest:     Chest wall: No tenderness.  Abdominal:     General: Bowel sounds are normal. There is no distension.     Palpations: Abdomen is soft. There is no shifting dullness, fluid wave, hepatomegaly, splenomegaly or mass.     Tenderness: There is abdominal tenderness in the epigastric area. There is no right CVA tenderness, left CVA tenderness, guarding or rebound. Negative signs include Murphy's sign, Rovsing's sign, McBurney's sign and psoas sign.     Comments: No CVA tenderness.  Musculoskeletal: Normal range of motion.     Comments: Moves all extremities without difficulty.  Skin:    General: Skin is warm and dry.  Neurological:     Mental Status: She is alert.      ED Treatments / Results  Labs (all labs ordered are listed, but only abnormal results are displayed) Labs Reviewed  COMPREHENSIVE METABOLIC PANEL - Abnormal; Notable for the following components:      Result Value   CO2 15 (*)    Glucose, Bld 165 (*)    All other components within normal limits  CBC - Abnormal; Notable for the following components:   WBC 16.9 (*)    All other components within normal limits  URINALYSIS, ROUTINE W REFLEX MICROSCOPIC - Abnormal; Notable for the following components:   APPearance HAZY (*)    Glucose, UA 150 (*)    Hgb urine dipstick LARGE (*)    Ketones, ur 20 (*)    Protein, ur 30 (*)    Leukocytes, UA SMALL (*)    RBC / HPF >50 (*)    Bacteria, UA FEW (*)    All other components within normal limits  LIPASE, BLOOD  I-STAT BETA HCG BLOOD, ED ([facility], [facility], [facility] ONLY)     EKG None  Radiology Ct Abdomen Pelvis W Contrast  Result Date: 08/22/2018 CLINICAL DATA:  Diffuse abdominal pain and vomiting for 1 day EXAM: CT ABDOMEN AND PELVIS WITH CONTRAST TECHNIQUE: Multidetector CT imaging of the abdomen and pelvis was performed using the standard protocol following bolus administration of intravenous contrast. CONTRAST:  OMNIPAQUE IOHEXOL 300 MG/ML  SOLN COMPARISON:  None. FINDINGS: Lower chest: No  acute abnormality. Hepatobiliary: No focal liver abnormality is seen. No gallstones, gallbladder wall thickening, or biliary dilatation. Pancreas: Unremarkable. No pancreatic ductal dilatation or surrounding inflammatory changes. Spleen: Normal in size without focal abnormality. Adrenals/Urinary Tract: Adrenal glands are within normal limits. Kidneys are well visualized bilaterally. Normal excretion of contrast is noted. No obstructive changes are seen. The bladder is partially distended. Stomach/Bowel: The appendix is within normal limits. No obstructive or inflammatory changes of the larger small-bowel are seen. No gastric abnormality is noted. Vascular/Lymphatic: No significant vascular findings are present. No enlarged abdominal or pelvic lymph nodes. Reproductive: Uterus and bilateral adnexa are unremarkable. Other: No abdominal wall hernia or abnormality. No abdominopelvic ascites. Musculoskeletal: No acute or significant osseous findings. IMPRESSION: No acute abnormality noted. Electronically Signed   By: Alcide CleverMark  Lukens M.D.   On: 08/22/2018 19:17    Procedures Procedures (including critical care time)  Medications Ordered in ED Medications  sodium chloride 0.9 % bolus 1,000 mL (0 mLs Intravenous Stopped 08/22/18 1821)  ondansetron (ZOFRAN) injection 4 mg (4 mg Intravenous Given 08/22/18 1624)  morphine 4 MG/ML injection 4 mg (4 mg Intravenous Given 08/22/18 1625)  iohexol (OMNIPAQUE) 300 MG/ML solution 100 mL (100 mLs Intravenous Contrast Given 08/22/18 1836)      Initial Impression / Assessment and Plan / ED Course  I have reviewed the triage vital signs and the nursing notes.  Pertinent labs & imaging results that were available during my care of the patient were reviewed by me and considered in my medical decision making (see chart for details).  22 year old who appears otherwise well presents for evaluation of emesis and abdominal pain.  Patient states she has had similar episodes in the past, which relate to her menstrual cycle.  Patient states this feels similar.  Has had multiple episodes of emesis over the last 3 days.  Patient states today she had some blood tinge in her emesis.  Generalized abdominal tenderness, without rebound, guarding or rigidity.  Mucous membranes dry.  Will obtain labs, urine and reevaluate.  Urinalysis with small leukocytes and few bacteria.  Negative CVA tenderness.  Low suspicion for pyelonephritis.  hCG negative, metabolic panel with CO2 at 15, likely dehydration, glucose 165.  Anion gap of 15.  Patient denies history of diabetes.  Discussed with patient follow-up for evaluation of elevated glucose.  Lipase 22.  CBC with leukocytosis at 16.9.  Patient feels improvement with pain medicine, antiemetics and fluids.  CBC leukocytosis will obtain CT scan and reevaluate.  1915: CT abdomen and pelvis negative for acute pathology.  Patient states she has no abdominal pain and has not had no episodes of emesis in department.  Patient requesting DC home at this time.  Will trial p.o. intake and reevaluate.  2030: Patient able to tolerate fluids in department.  Patient blood-tinged sputum most likely to small Mallory-Weiss tear secondary to repeat emesis.  Low suspicion for GI bleed,or esophageal rupture.   Patient is nontoxic, nonseptic appearing, in no apparent distress.  Patient's pain and other symptoms adequately managed in emergency department.  Fluid bolus given.  Labs, imaging and vitals reviewed.  Patient does not meet  the SIRS or Sepsis criteria.  On repeat exam patient does not have a surgical abdomin and there are no peritoneal signs.  No indication of appendicitis, bowel obstruction, bowel perforation, cholecystitis, diverticulitis, PID or ectopic pregnancy.  Patient discharged home with symptomatic treatment and given strict instructions for follow-up with their primary care physician.  I have  also discussed reasons to return immediately to the ER.  Patient expresses understanding and agrees with plan.     Final Clinical Impressions(s) / ED Diagnoses   Final diagnoses:  Generalized abdominal pain  Non-intractable vomiting with nausea, unspecified vomiting type  Acute cystitis without hematuria    ED Discharge Orders         Ordered    cephALEXin (KEFLEX) 500 MG capsule  2 times daily     08/22/18 2110    ondansetron (ZOFRAN ODT) 4 MG disintegrating tablet  Every 8 hours PRN     08/22/18 2110           Larrisa Cravey A, PA-C 08/22/18 2111    Loren Racer, MD 08/23/18 1846

## 2018-08-22 NOTE — ED Notes (Signed)
Pt returned from CT °

## 2018-08-22 NOTE — ED Notes (Signed)
Pt informed she still needs to provide UA. Pt given specimen cup and verbalized understanding.

## 2018-08-22 NOTE — Discharge Instructions (Addendum)
Your evaluated today for abdominal pain and nausea and vomiting.  Your CT scan was negative.  Urinalysis did show you may have a urinary tract infection.  I will prescribe you Keflex for this.  Please help with your PCP for reevaluation.  Return to the ED for any new or worsening symptoms.

## 2018-09-26 ENCOUNTER — Other Ambulatory Visit: Payer: Self-pay

## 2018-09-26 ENCOUNTER — Emergency Department (HOSPITAL_COMMUNITY)
Admission: EM | Admit: 2018-09-26 | Discharge: 2018-09-27 | Disposition: A | Discharge status: Home / Self Care | Payer: Self-pay | Attending: Emergency Medicine | Admitting: Emergency Medicine

## 2018-09-26 ENCOUNTER — Encounter (HOSPITAL_COMMUNITY): Payer: Self-pay

## 2018-09-26 DIAGNOSIS — Z5321 Procedure and treatment not carried out due to patient leaving prior to being seen by health care provider: Secondary | ICD-10-CM | POA: Insufficient documentation

## 2018-09-26 DIAGNOSIS — R109 Unspecified abdominal pain: Secondary | ICD-10-CM | POA: Insufficient documentation

## 2018-09-26 LAB — COMPREHENSIVE METABOLIC PANEL
ALT: 11 U/L (ref 0–44)
AST: 20 U/L (ref 15–41)
Albumin: 4 g/dL (ref 3.5–5.0)
Alkaline Phosphatase: 56 U/L (ref 38–126)
Anion gap: 11 (ref 5–15)
BUN: 6 mg/dL (ref 6–20)
CO2: 19 mmol/L — ABNORMAL LOW (ref 22–32)
Calcium: 9.4 mg/dL (ref 8.9–10.3)
Chloride: 108 mmol/L (ref 98–111)
Creatinine, Ser: 0.8 mg/dL (ref 0.44–1.00)
Glucose, Bld: 135 mg/dL — ABNORMAL HIGH (ref 70–99)
Potassium: 3.7 mmol/L (ref 3.5–5.1)
Sodium: 138 mmol/L (ref 135–145)
Total Bilirubin: 0.7 mg/dL (ref 0.3–1.2)
Total Protein: 7.6 g/dL (ref 6.5–8.1)

## 2018-09-26 LAB — CBC
HCT: 39 % (ref 36.0–46.0)
Hemoglobin: 12.5 g/dL (ref 12.0–15.0)
MCH: 29.7 pg (ref 26.0–34.0)
MCHC: 32.1 g/dL (ref 30.0–36.0)
MCV: 92.6 fL (ref 80.0–100.0)
NRBC: 0 % (ref 0.0–0.2)
Platelets: 370 10*3/uL (ref 150–400)
RBC: 4.21 MIL/uL (ref 3.87–5.11)
RDW: 12.7 % (ref 11.5–15.5)
WBC: 18.1 10*3/uL — ABNORMAL HIGH (ref 4.0–10.5)

## 2018-09-26 LAB — I-STAT BETA HCG BLOOD, ED (MC, WL, AP ONLY)

## 2018-09-26 LAB — LIPASE, BLOOD: Lipase: 21 U/L (ref 11–51)

## 2018-09-26 MED ORDER — ONDANSETRON 4 MG PO TBDP
4.0000 mg | ORAL_TABLET | Freq: Once | ORAL | Status: DC | PRN
  Filled 2018-09-26: qty 1

## 2018-09-26 MED ORDER — SODIUM CHLORIDE 0.9% FLUSH: 3.0000 mL | Freq: Once | INTRAVENOUS | Status: DC

## 2018-09-26 NOTE — ED Notes (Signed)
Pt. Left without being seen by provider.  

## 2018-09-26 NOTE — ED Triage Notes (Signed)
Pt here for emesis of blood and and stomach contents for the last 3 hours.  Pt A&O and ambulatory to triage, looks uncomfortable in the chair.

## 2018-10-23 ENCOUNTER — Emergency Department (HOSPITAL_COMMUNITY)
Admission: EM | Admit: 2018-10-23 | Discharge: 2018-10-23 | Disposition: A | Discharge status: Home / Self Care | Payer: Self-pay | Attending: Emergency Medicine | Admitting: Emergency Medicine

## 2018-10-23 ENCOUNTER — Encounter (HOSPITAL_COMMUNITY): Payer: Self-pay | Admitting: Emergency Medicine

## 2018-10-23 ENCOUNTER — Emergency Department (HOSPITAL_COMMUNITY): Payer: Self-pay

## 2018-10-23 ENCOUNTER — Other Ambulatory Visit: Payer: Self-pay

## 2018-10-23 DIAGNOSIS — R112 Nausea with vomiting, unspecified: Secondary | ICD-10-CM | POA: Insufficient documentation

## 2018-10-23 DIAGNOSIS — J45909 Unspecified asthma, uncomplicated: Secondary | ICD-10-CM | POA: Insufficient documentation

## 2018-10-23 DIAGNOSIS — R197 Diarrhea, unspecified: Secondary | ICD-10-CM

## 2018-10-23 DIAGNOSIS — N920 Excessive and frequent menstruation with regular cycle: Secondary | ICD-10-CM | POA: Insufficient documentation

## 2018-10-23 LAB — COMPREHENSIVE METABOLIC PANEL
ALT: 13 U/L (ref 0–44)
AST: 17 U/L (ref 15–41)
Albumin: 4.3 g/dL (ref 3.5–5.0)
Alkaline Phosphatase: 55 U/L (ref 38–126)
Anion gap: 13 (ref 5–15)
BILIRUBIN TOTAL: 0.6 mg/dL (ref 0.3–1.2)
BUN: 8 mg/dL (ref 6–20)
CHLORIDE: 103 mmol/L (ref 98–111)
CO2: 21 mmol/L — ABNORMAL LOW (ref 22–32)
Calcium: 9.5 mg/dL (ref 8.9–10.3)
Creatinine, Ser: 0.81 mg/dL (ref 0.44–1.00)
GFR calc Af Amer: >60 mL/min (ref >60)
Glucose, Bld: 172 mg/dL — ABNORMAL HIGH (ref 70–99)
Potassium: 3.2 mmol/L — ABNORMAL LOW (ref 3.5–5.1)
Sodium: 137 mmol/L (ref 135–145)
Total Protein: 7.8 g/dL (ref 6.5–8.1)

## 2018-10-23 LAB — CBC WITH DIFFERENTIAL/PLATELET
Abs Immature Granulocytes: 0.04 10*3/uL (ref 0.00–0.07)
BASOS ABS: 0 10*3/uL (ref 0.0–0.1)
Basophils Relative: 0 %
Eosinophils Absolute: 0 10*3/uL (ref 0.0–0.5)
Eosinophils Relative: 0 %
HCT: 39.9 % (ref 36.0–46.0)
Hemoglobin: 12.7 g/dL (ref 12.0–15.0)
Immature Granulocytes: 0 %
Lymphocytes Relative: 13 %
Lymphs Abs: 1.6 10*3/uL (ref 0.7–4.0)
MCH: 29.4 pg (ref 26.0–34.0)
MCHC: 31.8 g/dL (ref 30.0–36.0)
MCV: 92.4 fL (ref 80.0–100.0)
Monocytes Absolute: 0.6 10*3/uL (ref 0.1–1.0)
Monocytes Relative: 5 %
NRBC: 0 % (ref 0.0–0.2)
Neutro Abs: 9.9 10*3/uL — ABNORMAL HIGH (ref 1.7–7.7)
Neutrophils Relative %: 82 %
Platelets: 391 10*3/uL (ref 150–400)
RBC: 4.32 MIL/uL (ref 3.87–5.11)
RDW: 12.7 % (ref 11.5–15.5)
WBC: 12.1 10*3/uL — ABNORMAL HIGH (ref 4.0–10.5)

## 2018-10-23 LAB — URINALYSIS, ROUTINE W REFLEX MICROSCOPIC
Bacteria, UA: NONE SEEN
Bilirubin Urine: NEGATIVE
GLUCOSE, UA: 50 mg/dL — AB
Ketones, ur: 20 mg/dL — AB
Leukocytes,Ua: NEGATIVE
Nitrite: NEGATIVE
PROTEIN: NEGATIVE mg/dL
Specific Gravity, Urine: >1.046 — ABNORMAL HIGH (ref 1.005–1.030)
pH: 8 (ref 5.0–8.0)

## 2018-10-23 LAB — LIPASE, BLOOD: LIPASE: 22 U/L (ref 11–51)

## 2018-10-23 LAB — I-STAT BETA HCG BLOOD, ED (MC, WL, AP ONLY): I-stat hCG, quantitative: <5 m[IU]/mL (ref <5)

## 2018-10-23 MED ORDER — SUCRALFATE 1 G PO TABS: 1.0000 g | ORAL_TABLET | Freq: Three times a day (TID) | ORAL | 0 refills | Status: DC

## 2018-10-23 MED ORDER — PROMETHAZINE HCL 25 MG/ML IJ SOLN
25.0000 mg | Freq: Once | INTRAMUSCULAR | Status: AC
  Administered 2018-10-23: 25 mg via INTRAVENOUS
  Filled 2018-10-23 (×2): qty 1

## 2018-10-23 MED ORDER — SODIUM CHLORIDE 0.9 % IV BOLUS
1000.0000 mL | Freq: Once | INTRAVENOUS | Status: AC
  Administered 2018-10-23: 1000 mL via INTRAVENOUS

## 2018-10-23 MED ORDER — ONDANSETRON HCL 4 MG/2ML IJ SOLN
4.0000 mg | Freq: Once | INTRAMUSCULAR | Status: AC
  Administered 2018-10-23: 4 mg via INTRAVENOUS
  Filled 2018-10-23: qty 2

## 2018-10-23 MED ORDER — FAMOTIDINE 20 MG PO TABS: 20.0000 mg | ORAL_TABLET | Freq: Two times a day (BID) | ORAL | 0 refills | Status: DC

## 2018-10-23 MED ORDER — PROMETHAZINE HCL 25 MG/ML IJ SOLN
25.0000 mg | Freq: Once | INTRAMUSCULAR | Status: AC
  Administered 2018-10-23: 25 mg via INTRAVENOUS

## 2018-10-23 MED ORDER — FAMOTIDINE IN NACL 20-0.9 MG/50ML-% IV SOLN
20.0000 mg | Freq: Once | INTRAVENOUS | Status: AC
  Administered 2018-10-23: 20 mg via INTRAVENOUS
  Filled 2018-10-23: qty 50

## 2018-10-23 MED ORDER — PROMETHAZINE HCL 25 MG PO TABS: 25.0000 mg | ORAL_TABLET | Freq: Four times a day (QID) | ORAL | 0 refills | Status: DC | PRN

## 2018-10-23 MED ORDER — IOHEXOL 300 MG/ML  SOLN
100.0000 mL | Freq: Once | INTRAMUSCULAR | Status: AC | PRN
  Administered 2018-10-23: 75 mL via INTRAVENOUS

## 2018-10-23 NOTE — ED Provider Notes (Signed)
[facility] EMERGENCY DEPARTMENT Provider Note   CSN: [id number] Arrival date & time: 10/23/18  9758  History   Chief Complaint Chief Complaint  Patient presents with  . Hematemesis  . Emesis  . Diarrhea    HPI Galina Orea is a 23 y.o. female with medical history significant for menorrhagia who presents for evaluation of emesis, diarrhea.  Patient states she has had multiple episodes of blood streaked emesis x 3days as well as multiple episodes of diarrhea. Blood BR in color however did have episode in EMS that was darker in color. Denies melena or bright red blood per rectum.  Patient states the symptoms are typical when gets her menstrual cycle.  Patient states she was told previously she need to be seen by an OB/GYN for her menorrhagia, however states she has not done so.  Denies recent travel, sick contacts.  Patient has not taken anything for symptoms PTA.  Denies fever, chills, headache, vision changes, sore throat, neck pain, neck stiffness, chest pain, shortness of breath, abdominal pain, dysuria, constipation, pelvic pain or vaginal discharge.  Denies additional aggravating or alleviating factors.  Denies history of NSAID use, H. pylori, liver disorders, esophageal varices, alcohol use, tobacco. Denies coffee ground emesis. Patient states that she "always has blood in my vomit."   Records review patient with similar symptoms in Dec and Jan this year.  History obtained from patient.  No interpreter was used.     HPI  Past Medical History:  Diagnosis Date  . Asthma     There are no active problems to display for this patient.   History reviewed. No pertinent surgical history.   OB History    Gravida  0   Para  0   Term  0   Preterm  0   AB  0   Living  0     SAB  0   TAB  0   Ectopic  0   Multiple  0   Live Births               Home Medications    Prior to Admission medications   Medication Sig Start Date End Date Taking?  Authorizing Provider  acetaminophen (TYLENOL) 325 MG tablet Take 650 mg by mouth every 6 (six) hours as needed for mild pain.    [provider]  diclofenac (VOLTAREN) 75 MG EC tablet Take 1 tablet (75 mg total) by mouth 2 (two) times daily. Patient not taking: Reported on 01/20/2017 01/14/17   Elvina Sidle, MD  famotidine (PEPCID) 20 MG tablet Take 1 tablet (20 mg total) by mouth 2 (two) times daily. 10/23/18   Ewen Varnell A, PA-C  ibuprofen (ADVIL,MOTRIN) 600 MG tablet Take 1 tablet (600 mg total) by mouth every 6 (six) hours as needed. 01/20/17   Audry Pili, PA-C  ondansetron (ZOFRAN ODT) 4 MG disintegrating tablet Take 1 tablet (4 mg total) by mouth every 8 (eight) hours as needed for nausea or vomiting. 08/22/18   Quinetta Shilling A, PA-C  promethazine (PHENERGAN) 25 MG tablet Take 1 tablet (25 mg total) by mouth every 6 (six) hours as needed for nausea or vomiting. 10/23/18   Pierina Schuknecht A, PA-C  sucralfate (CARAFATE) 1 g tablet Take 1 tablet (1 g total) by mouth 4 (four) times daily -  with meals and at bedtime for 10 days. 10/23/18 11/02/18  Irma Roulhac A, PA-C  Triprolidine-Pseudoephedrine (ANTIHISTAMINE PO) Take 3 tablets by mouth daily as needed (allergies).  [provider]    Family History No family history on file.  Social History Social History   Tobacco Use  . Smoking status: Never Smoker  . Smokeless tobacco: Never Used  Substance Use Topics  . Alcohol use: No  . Drug use: Yes    Types: Marijuana     Allergies   Shellfish allergy   Review of Systems Review of Systems  Constitutional: Negative.   HENT: Negative.   Respiratory: Negative.   Cardiovascular: Negative.   Gastrointestinal: Positive for diarrhea, nausea and vomiting. Negative for abdominal distention, abdominal pain, anal bleeding, blood in stool, constipation and rectal pain.  Genitourinary: Negative.   Musculoskeletal: Negative.   Skin: Negative.   Neurological:  Negative.   All other systems reviewed and are negative.  Physical Exam Updated Vital Signs BP (!) 137/93   Pulse 98   Temp 98.3 F (36.8 C) (Oral)   Resp 16   Ht  (1.6 m)   LMP 10/20/2018   SpO2 100%   BMI 29.23 kg/m   Physical Exam Vitals signs and nursing note reviewed.  Constitutional:      General: She is not in acute distress.    Appearance: She is well-developed. She is not ill-appearing or toxic-appearing.  HENT:     Head: Atraumatic.     Jaw: There is normal jaw occlusion.     Nose: Nose normal.     Right Sinus: No maxillary sinus tenderness or frontal sinus tenderness.     Left Sinus: No maxillary sinus tenderness or frontal sinus tenderness.     Mouth/Throat:     Comments: Unable to assess mouth, patient actively vomiting on initial evaluation. Eyes:     Pupils: Pupils are equal, round, and reactive to light.  Neck:     Musculoskeletal: Full passive range of motion without pain and normal range of motion.     Comments: No neck stiffness or neck rigidity. Phonation normal. No cervical lymphadenopathy. Cardiovascular:     Rate and Rhythm: Normal rate.     Pulses: Normal pulses.     Heart sounds: Normal heart sounds.  Pulmonary:     Effort: No respiratory distress.     Breath sounds: Normal breath sounds.     Comments: Clear to auscultation bilaterally without wheeze, rhonchi rales.  Able speak in full sentences. No assessory muscle usage. Abdominal:     General: There is no distension.     Comments: Soft, nontender without rebound or guarding.  No CVA tenderness.  Normoactive bowel sounds.  Musculoskeletal: Normal range of motion.     Comments: Moves all extremities without difficulty. Ambulatory in department without difficulty.  Skin:    General: Skin is warm and dry.     Comments: No rashes or lesions. Brisk cap refill. Normal skin turgor.  Neurological:     Mental Status: She is alert.    ED Treatments / Results  Labs (all labs ordered are  listed, but only abnormal results are displayed) Labs Reviewed  CBC WITH DIFFERENTIAL/PLATELET - Abnormal; Notable for the following components:      Result Value   WBC 12.1 (*)    Neutro Abs 9.9 (*)    All other components within normal limits  COMPREHENSIVE METABOLIC PANEL - Abnormal; Notable for the following components:   Potassium 3.2 (*)    CO2 21 (*)    Glucose, Bld 172 (*)    All other components within normal limits  URINALYSIS, ROUTINE W REFLEX MICROSCOPIC -  Abnormal; Notable for the following components:   Specific Gravity, Urine >1.046 (*)    Glucose, UA 50 (*)    Hgb urine dipstick LARGE (*)    Ketones, ur 20 (*)    All other components within normal limits  LIPASE, BLOOD  I-STAT BETA HCG BLOOD, ED ([facility], [facility], [facility] ONLY)    EKG None  Radiology Ct Abdomen Pelvis W Contrast  Result Date: 10/23/2018 CLINICAL DATA:  Nausea vomiting for 3 days. EXAM: CT ABDOMEN AND PELVIS WITH CONTRAST TECHNIQUE: Multidetector CT imaging of the abdomen and pelvis was performed using the standard protocol following bolus administration of intravenous contrast. CONTRAST:  75mL OMNIPAQUE IOHEXOL 300 MG/ML  SOLN COMPARISON:  08/22/2018 FINDINGS: Lower chest: No acute abnormality. Small hiatal hernia. Hepatobiliary: No focal liver abnormality is seen. No gallstones, gallbladder wall thickening, or biliary dilatation. Pancreas: Unremarkable. No pancreatic ductal dilatation or surrounding inflammatory changes. Spleen: Normal in size without focal abnormality. Adrenals/Urinary Tract: Adrenal glands are unremarkable. Kidneys are normal, without renal calculi, focal lesion, or hydronephrosis. Bladder is unremarkable. Stomach/Bowel: Stomach is within normal limits. Appendix appears normal. No evidence of bowel wall thickening, distention, or inflammatory changes. Vascular/Lymphatic: No significant vascular findings are present. No enlarged abdominal or pelvic lymph nodes. Reproductive: Uterus and bilateral adnexa  are unremarkable. Other: No abdominal wall hernia or abnormality. No abdominopelvic ascites. Musculoskeletal: No acute or significant osseous findings. IMPRESSION: No evidence of acute abnormalities within the abdomen or pelvis. Small hiatal hernia. Electronically Signed   By: Ted Mcalpine M.D.   On: 10/23/2018 11:14    Procedures Procedures (including critical care time)  Medications Ordered in ED Medications  sodium chloride 0.9 % bolus 1,000 mL (0 mLs Intravenous Stopped 10/23/18 1050)  ondansetron (ZOFRAN) injection 4 mg (4 mg Intravenous Given 10/23/18 0848)  famotidine (PEPCID) IVPB 20 mg premix (0 mg Intravenous Stopped 10/23/18 0920)  promethazine (PHENERGAN) injection 25 mg (25 mg Intravenous Given 10/23/18 0944)  iohexol (OMNIPAQUE) 300 MG/ML solution 100 mL (75 mLs Intravenous Contrast Given 10/23/18 1028)  promethazine (PHENERGAN) injection 25 mg (25 mg Intravenous Given 10/23/18 1246)     Initial Impression / Assessment and Plan / ED Course  I have reviewed the triage vital signs and the nursing notes.  Pertinent labs & imaging results that were available during my care of the patient were reviewed by me and considered in my medical decision making (see chart for details).  23 year old female appears otherwise well presents for evaluation of emesis and diarrhea.  Afebrile, nonseptic, non-ill-appearing.  Emesis is streaked with bright red blood, per my exam. No coffee ground emesis. No gross bloody emesis. Diarrhea without melena or bright red blood per rectum. Abdomen soft, nontender without rebound or guarding.  Patient actively vomiting on initial evaluation.  Patient states has similar symptoms every month around her menstrual cycle. Per records review similar sx in Dec and Jan. States she normally gets IV Zofran and Phenergan and it self resolves. Denies history of ulcer, H. pylori, liver disorders, esophageal varices, chronic NSAID use.  Brisk capillary refill.  Normal skin  turgor.  Will get labs, urine, IV fluids, antiemetics and reevaluate.  Patient states she has no pain at this time. Will reevaluate.  0920: Patient with continued mild nausea without emesis with Zofran. Patient request staff something to drink. Will give Phenergan and reevaluate. Abdomen soft, non tender without rebound or guarding. Blood tinged emesis likely Mallory Weis tear from multiple episodes of emesis. Low suspicion for acute GI bleed, no  elevated in BUN, boerhaave rupture, bleeding varices, PUD.  CBC with mild leukocytosis at 12.1, mild decrease CO2 at 21, hyperglycemia at 172, anion gap 13, evidence of additional electrolyte, renal or liver abnormalities, hCG negative, urinalysis with ketonuria, likely related to dehydration. Lipase 22  1130: Significant improvement in symptoms on reevaluation.  No episodes of emesis since arrival after antiemetics were given.  Abdomen soft, nontender without rebound or guarding.  CT scan negative for any acute findings. Patient requesting crackers and gingerale. Will provide and reevaluate. No episodes of diarrhea in department.  1230: Urinalysis negative for infection. She was able to tolerate ginger ale as well as crackers without emesis. Patient has not had emesis since arrival 4 hours ago. Patient states she is worried she will get nauseous on the drive home and prior to picking up prescriptions, requesting additional Phenergan prior to dc. Patient is nontoxic, nonseptic appearing, in no apparent distress.  Patient's pain and other symptoms adequately managed in emergency department.  Fluid bolus given.  Labs, imaging and vitals reviewed.  Patient does not meet the SIRS or Sepsis criteria.  On repeat exam patient does not have a surgical abdomin and there are no peritoneal signs.  No indication of appendicitis, bowel obstruction, bowel perforation, cholecystitis, diverticulitis, PID or ectopic pregnancy.  Patient discharged home with symptomatic treatment and  given strict instructions for follow-up with their primary care physician.  I have also discussed reasons to return immediately to the ER.  Patient expresses understanding and agrees with plan.     Final Clinical Impressions(s) / ED Diagnoses   Final diagnoses:  Nausea vomiting and diarrhea  Menorrhagia with regular cycle    ED Discharge Orders         Ordered    promethazine (PHENERGAN) 25 MG tablet  Every 6 hours PRN     10/23/18 1244    sucralfate (CARAFATE) 1 g tablet  3 times daily with meals & bedtime     10/23/18 1244    famotidine (PEPCID) 20 MG tablet  2 times daily     10/23/18 1244           Tymeer Vaquera A, PA-C 10/23/18 1252    Cathren Laine, MD 10/23/18 1506

## 2018-10-23 NOTE — ED Notes (Signed)
Nausea has returned as before.

## 2018-10-23 NOTE — ED Notes (Signed)
Pt aware we need urine sample.  

## 2018-10-23 NOTE — Discharge Instructions (Addendum)
Your evaluated today for emesis and diarrhea.  Your laboratory work-up was negative in department.  I have given you prescription for Phenergan, to help with your nausea and vomiting.  Please take as prescribed.  I have also given you Carafate to help coat your stomach as well as Pepcid.  Please take as prescribed.  Please follow-up with OB/GYN given your severe symptoms with your menstrual cycle.  If you do not have one I have referred you to one.  Return to the ED for any worsening symptoms.

## 2018-10-23 NOTE — ED Triage Notes (Signed)
Arrived via EMS onset 3 days ago developed nausea, emesis, and diarrhea when starting her cycle.  EMS reported emesis en route today dark red.

## 2019-03-02 ENCOUNTER — Observation Stay: Payer: Self-pay

## 2019-03-02 ENCOUNTER — Encounter: Payer: Self-pay | Admitting: Emergency Medicine

## 2019-03-02 ENCOUNTER — Observation Stay
Admission: EM | Admit: 2019-03-02 | Discharge: 2019-03-03 | Disposition: A | Discharge status: Home / Self Care | Payer: Self-pay | Attending: Internal Medicine | Admitting: Internal Medicine

## 2019-03-02 ENCOUNTER — Observation Stay
Admit: 2019-03-02 | Discharge: 2019-03-02 | Disposition: A | Discharge status: Home / Self Care | Payer: Self-pay | Attending: Internal Medicine | Admitting: Internal Medicine

## 2019-03-02 ENCOUNTER — Other Ambulatory Visit: Payer: Self-pay

## 2019-03-02 DIAGNOSIS — N3001 Acute cystitis with hematuria: Principal | ICD-10-CM | POA: Insufficient documentation

## 2019-03-02 DIAGNOSIS — F17210 Nicotine dependence, cigarettes, uncomplicated: Secondary | ICD-10-CM | POA: Insufficient documentation

## 2019-03-02 DIAGNOSIS — E860 Dehydration: Secondary | ICD-10-CM | POA: Insufficient documentation

## 2019-03-02 DIAGNOSIS — R9431 Abnormal electrocardiogram [ECG] [EKG]: Secondary | ICD-10-CM | POA: Insufficient documentation

## 2019-03-02 DIAGNOSIS — J45909 Unspecified asthma, uncomplicated: Secondary | ICD-10-CM | POA: Insufficient documentation

## 2019-03-02 DIAGNOSIS — Z791 Long term (current) use of non-steroidal anti-inflammatories (NSAID): Secondary | ICD-10-CM | POA: Insufficient documentation

## 2019-03-02 DIAGNOSIS — R55 Syncope and collapse: Secondary | ICD-10-CM | POA: Insufficient documentation

## 2019-03-02 DIAGNOSIS — Z1159 Encounter for screening for other viral diseases: Secondary | ICD-10-CM | POA: Insufficient documentation

## 2019-03-02 DIAGNOSIS — Z79899 Other long term (current) drug therapy: Secondary | ICD-10-CM | POA: Insufficient documentation

## 2019-03-02 LAB — CBC
HCT: 38.9 % (ref 36.0–46.0)
Hemoglobin: 12.8 g/dL (ref 12.0–15.0)
MCH: 30.2 pg (ref 26.0–34.0)
MCHC: 32.9 g/dL (ref 30.0–36.0)
MCV: 91.7 fL (ref 80.0–100.0)
Platelets: 375 10*3/uL (ref 150–400)
RBC: 4.24 MIL/uL (ref 3.87–5.11)
RDW: 12.4 % (ref 11.5–15.5)
WBC: 15.6 10*3/uL — ABNORMAL HIGH (ref 4.0–10.5)
nRBC: 0 % (ref 0.0–0.2)

## 2019-03-02 LAB — URINALYSIS, COMPLETE (UACMP) WITH MICROSCOPIC
Bacteria, UA: NONE SEEN
Bilirubin Urine: NEGATIVE
Glucose, UA: >=500 mg/dL — AB
Ketones, ur: 20 mg/dL — AB
Nitrite: NEGATIVE
Protein, ur: 30 mg/dL — AB
RBC / HPF: >50 RBC/hpf — ABNORMAL HIGH (ref 0–5)
Specific Gravity, Urine: 1.03 (ref 1.005–1.030)
pH: 7 (ref 5.0–8.0)

## 2019-03-02 LAB — BASIC METABOLIC PANEL
Anion gap: 12 (ref 5–15)
BUN: 13 mg/dL (ref 6–20)
CO2: 21 mmol/L — ABNORMAL LOW (ref 22–32)
Calcium: 9.4 mg/dL (ref 8.9–10.3)
Chloride: 107 mmol/L (ref 98–111)
Creatinine, Ser: 0.8 mg/dL (ref 0.44–1.00)
GFR calc Af Amer: >60 mL/min (ref >60)
GFR calc non Af Amer: >60 mL/min (ref >60)
Glucose, Bld: 206 mg/dL — ABNORMAL HIGH (ref 70–99)
Potassium: 3.7 mmol/L (ref 3.5–5.1)
Sodium: 140 mmol/L (ref 135–145)

## 2019-03-02 LAB — URINE DRUG SCREEN, QUALITATIVE (ARMC ONLY)
Amphetamines, Ur Screen: NOT DETECTED
Barbiturates, Ur Screen: NOT DETECTED
Benzodiazepine, Ur Scrn: NOT DETECTED
Cannabinoid 50 Ng, Ur ~~LOC~~: POSITIVE — AB
Cocaine Metabolite,Ur ~~LOC~~: NOT DETECTED
MDMA (Ecstasy)Ur Screen: NOT DETECTED
Methadone Scn, Ur: NOT DETECTED
Opiate, Ur Screen: POSITIVE — AB
Phencyclidine (PCP) Ur S: NOT DETECTED
Tricyclic, Ur Screen: NOT DETECTED

## 2019-03-02 LAB — PREGNANCY, URINE: Preg Test, Ur: NEGATIVE

## 2019-03-02 LAB — TROPONIN I (HIGH SENSITIVITY)
Troponin I (High Sensitivity): <2 ng/L (ref <18)
Troponin I (High Sensitivity): 4 ng/L (ref <18)
Troponin I (High Sensitivity): <2 ng/L (ref <18)

## 2019-03-02 LAB — MAGNESIUM: Magnesium: 1.8 mg/dL (ref 1.7–2.4)

## 2019-03-02 LAB — ECHOCARDIOGRAM COMPLETE
Height: 63 in
Weight: 2960 oz

## 2019-03-02 MED ORDER — MAGNESIUM SULFATE 2 GM/50ML IV SOLN
2.0000 g | Freq: Once | INTRAVENOUS | Status: AC
  Administered 2019-03-02: 2 g via INTRAVENOUS
  Filled 2019-03-02: qty 50

## 2019-03-02 MED ORDER — LORAZEPAM 2 MG/ML IJ SOLN
1.0000 mg | Freq: Once | INTRAMUSCULAR | Status: AC
  Administered 2019-03-02: 1 mg via INTRAVENOUS
  Filled 2019-03-02: qty 1

## 2019-03-02 MED ORDER — LACTATED RINGERS IV SOLN
INTRAVENOUS | Status: DC
  Administered 2019-03-02: 1000 mL via INTRAVENOUS

## 2019-03-02 MED ORDER — SODIUM CHLORIDE 0.9 % IV SOLN
2.0000 g | Freq: Once | INTRAVENOUS | Status: AC
  Administered 2019-03-02: 2 g via INTRAVENOUS
  Filled 2019-03-02: qty 20

## 2019-03-02 MED ORDER — CAPSAICIN 0.025 % EX CREA
TOPICAL_CREAM | Freq: Two times a day (BID) | CUTANEOUS | Status: DC
  Administered 2019-03-02 – 2019-03-03 (×3): via TOPICAL
  Filled 2019-03-02 (×3): qty 60

## 2019-03-02 MED ORDER — SODIUM CHLORIDE 0.9 % IV BOLUS
1000.0000 mL | Freq: Once | INTRAVENOUS | Status: AC
  Administered 2019-03-02: 1000 mL via INTRAVENOUS

## 2019-03-02 MED ORDER — SODIUM CHLORIDE 0.9 % IV SOLN
1.0000 g | INTRAVENOUS | Status: DC
  Administered 2019-03-03: 1 g via INTRAVENOUS
  Filled 2019-03-02: qty 10

## 2019-03-02 MED ORDER — KETOROLAC TROMETHAMINE 30 MG/ML IJ SOLN
15.0000 mg | Freq: Once | INTRAMUSCULAR | Status: AC
  Administered 2019-03-02: 15 mg via INTRAVENOUS
  Filled 2019-03-02: qty 1

## 2019-03-02 MED ORDER — SODIUM CHLORIDE 0.9 % IV SOLN
INTRAVENOUS | Status: DC
  Administered 2019-03-02: 21:00:00 via INTRAVENOUS
  Administered 2019-03-02: 1000 mL via INTRAVENOUS

## 2019-03-02 MED ORDER — SODIUM CHLORIDE 0.9% FLUSH
3.0000 mL | Freq: Once | INTRAVENOUS | Status: AC
  Administered 2019-03-02: 3 mL via INTRAVENOUS

## 2019-03-02 MED ORDER — ENOXAPARIN SODIUM 40 MG/0.4ML ~~LOC~~ SOLN
40.0000 mg | SUBCUTANEOUS | Status: DC
  Administered 2019-03-02: 40 mg via SUBCUTANEOUS
  Filled 2019-03-02: qty 0.4

## 2019-03-02 NOTE — H&P (Signed)
[facility] [place] [facility]   PATIENT NAME: Brandi HailDejah Simko    MR#:  [id number]  DATE OF BIRTH:  1996-03-06  DATE OF ADMISSION:  03/02/2019  PRIMARY CARE PHYSICIAN: Patient, No Pcp Per   REQUESTING/REFERRING PHYSICIAN: Shaune PollackIsaacs, Cameron  CHIEF COMPLAINT:  Nausea and vomiting and passing out  HISTORY OF PRESENT ILLNESS:  Brandi [name]  is a 23 y.o. female with a known history of asthma who presented to the emergency room with complaints of nausea and vomiting and feeling of lightheaded over the last several days.  This usually happens to her during her menstrual cycle.  She just started her menstrual cycle yesterday.  She reported having an episode where she passed out while laying in bed on her friend was having difficulty arousing her.  She does not have full recollection of the events.  Denies any chest pain.  No shortness of breath.  No fevers.  Patient was evaluated in the emergency room and pregnancy test done was negative.  Urinalysis suggestive for UTI.  Patient given a dose of Rocephin.  Patient noted to be tachycardic with heart rate of 117.  Twelve-lead EKG with prolonged QTC of 518.  IV fluids started.  Medical service called to admit patient for further evaluation.  PAST MEDICAL HISTORY:   Past Medical History:  Diagnosis Date  . Asthma     PAST SURGICAL HISTORY:  History reviewed. No pertinent surgical history.  SOCIAL HISTORY:   Social History   Tobacco Use  . Smoking status: Current Every Day Smoker    Types: Cigarettes  . Smokeless tobacco: Never Used  Substance Use Topics  . Alcohol use: Yes    FAMILY HISTORY:  No family history on file.  DRUG ALLERGIES:   Allergies  Allergen Reactions  . Shellfish Allergy Anaphylaxis    REVIEW OF SYSTEMS:   Review of Systems  Constitutional: Negative for chills and fever.  HENT: Negative for hearing loss and tinnitus.   Eyes: Negative for blurred vision.  Respiratory: Negative for cough  and shortness of breath.   Cardiovascular: Negative for chest pain and palpitations.  Gastrointestinal: Positive for nausea and vomiting. Negative for abdominal pain, diarrhea and heartburn.  Genitourinary: Negative for frequency and urgency.  Musculoskeletal: Negative for myalgias.  Skin: Negative for itching and rash.  Neurological: Negative for dizziness and headaches.  Psychiatric/Behavioral: Negative for depression and hallucinations.    MEDICATIONS AT HOME:   Prior to Admission medications   Medication Sig Start Date End Date Taking? Authorizing Provider  acetaminophen (TYLENOL) 325 MG tablet Take 650 mg by mouth every 6 (six) hours as needed for mild pain.    [provider]  diclofenac (VOLTAREN) 75 MG EC tablet Take 1 tablet (75 mg total) by mouth 2 (two) times daily. Patient not taking: Reported on 01/20/2017 01/14/17   Elvina SidleLauenstein, Kurt, MD  famotidine (PEPCID) 20 MG tablet Take 1 tablet (20 mg total) by mouth 2 (two) times daily. 10/23/18   Henderly, Britni A, PA-C  ibuprofen (ADVIL,MOTRIN) 600 MG tablet Take 1 tablet (600 mg total) by mouth every 6 (six) hours as needed. 01/20/17   Audry PiliMohr, Tyler, PA-C  ondansetron (ZOFRAN ODT) 4 MG disintegrating tablet Take 1 tablet (4 mg total) by mouth every 8 (eight) hours as needed for nausea or vomiting. 08/22/18   Henderly, Britni A, PA-C  promethazine (PHENERGAN) 25 MG tablet Take 1 tablet (25 mg total) by mouth every 6 (six) hours as needed for nausea or vomiting.  10/23/18   Henderly, Britni A, PA-C  sucralfate (CARAFATE) 1 g tablet Take 1 tablet (1 g total) by mouth 4 (four) times daily -  with meals and at bedtime for 10 days. 10/23/18 11/02/18  Henderly, Britni A, PA-C  Triprolidine-Pseudoephedrine (ANTIHISTAMINE PO) Take 3 tablets by mouth daily as needed (allergies).    [provider]      VITAL SIGNS:  Blood pressure 134/73, pulse (!) 105, temperature 98.3 F (36.8 C), temperature source Oral, resp. rate 20, height 5'  3" (1.6 m), weight 83.9 kg, last menstrual period 03/01/2019, SpO2 100 %.  PHYSICAL EXAMINATION:  Physical Exam  GENERAL:  23 y.o.-year-old patient lying in the bed with no acute distress.  EYES: Pupils equal, round, reactive to light and accommodation. No scleral icterus. Extraocular muscles intact.  HEENT: Head atraumatic, normocephalic.  Dry oral mucosa  NECK:  Supple, no jugular venous distention. No thyroid enlargement, no tenderness.  LUNGS: Normal breath sounds bilaterally, no wheezing, rales,rhonchi or crepitation. No use of accessory muscles of respiration.  CARDIOVASCULAR: S1, S2 normal. No murmurs, rubs, or gallops.  ABDOMEN: Soft, nontender, nondistended. Bowel sounds present. No organomegaly or mass.  EXTREMITIES: No pedal edema, cyanosis, or clubbing.  NEUROLOGIC: Cranial nerves II through XII are intact. Muscle strength 5/5 in all extremities. Sensation intact. Gait not checked.  PSYCHIATRIC: The patient is alert and oriented x 3.  SKIN: Dry skin.  No obvious rash, lesion, or ulcer.   LABORATORY PANEL:   CBC Recent Labs  Lab 03/02/19 0708  WBC 15.6*  HGB 12.8  HCT 38.9  PLT 375   ------------------------------------------------------------------------------------------------------------------  Chemistries  Recent Labs  Lab 03/02/19 0708  NA 140  K 3.7  CL 107  CO2 21*  GLUCOSE 206*  BUN 13  CREATININE 0.80  CALCIUM 9.4  MG 1.8   ------------------------------------------------------------------------------------------------------------------  Cardiac Enzymes No results for input(s): TROPONINI in the last 168 hours. ------------------------------------------------------------------------------------------------------------------  RADIOLOGY:  No results found.    IMPRESSION AND PLAN:  Patient is a 23 year old female with history of asthma admitted for further evaluation of syncope.  1.  Syncope. Patient had an episode of syncope.  Had been  having nausea and vomiting and feeling lightheaded prior to passing out. Suspicion of vasovagal syncope.  However noted to have prolonged QTC on twelve-lead EKG. Follow-up on repeat twelve-lead EKG in a.m.  Placed on telemetry. Carotid Doppler ultrasound 2D echocardiogram.  IV fluid hydration. Follow-up cardiac enzymes to rule out acute coronary syndrome.  2.  Dehydration Placed on IV fluids.  Monitor.  3.  Urinary tract infection Started empirically on IV Rocephin.  IV fluid hydration. Follow-up on urine culture and sensitivities. Patient does not appear septic clinically.  4.  History of asthma Stable  DVT prophylaxis; Lovenox   All the records are reviewed and case discussed with ED provider. Management plans discussed with the patient, family and they are in agreement.  CODE STATUS: Full code  TOTAL TIME TAKING CARE OF THIS PATIENT: 58 minutes.    Liannah Yarbough M.D on 03/02/2019 at 10:34 AM  Between 7am to 6pm - Pager - [phone]  After 6pm go to [email or web address] - Proofreader  [facility]  Office  [phone]  CC: Primary care physician; Patient, No Pcp Per   Note: This dictation was prepared with Dragon dictation along with smaller phrase technology. Any transcriptional errors that result from this process are unintentional.

## 2019-03-02 NOTE — ED Provider Notes (Signed)
[facility] Emergency Department Provider Note  ____________________________________________   First MD Initiated Contact with Patient 03/02/19 [phone]     (approximate)  I have reviewed the triage vital signs and the nursing notes.   HISTORY  Chief Complaint Near Syncope    HPI Brandi [name] is a 23 y.o. female with past medical history of asthma here with multiple complaints.  The patient states that she is here right now because she has had nausea, weakness, and multiple episodes in which she is felt lightheaded over the last several days.  She states that she did start her menstrual period every month, when she gets her period, she gets progressively worsening nausea, cramping, vomiting, and multiple episodes in which she feels like she is going to pass out.  These seem to correlate when she is in severe pain.  This seems to happen every month.  She has never seen an OB for this, she states she does not have a regular doctor.  She is had multiple ER visits for the same complaints.  She describes generalized, aching, cramping, diffuse abdominal pain with associated uterine cramps.  Denies any vaginal discharge.  She states the pain is similar to her previous episodes related to her periods, and declines pelvic exam.  No urinary symptoms.  No diarrhea.  She is had nausea and nonbloody, nonbilious emesis, which is also not uncommon for her.  Denies any known personal or family history of syncope or sudden cardiac death.  No other complaints.  She is been trying over-the-counter and previously prescribed anti-nausea medication without significant relief.        Past Medical History:  Diagnosis Date   Asthma     There are no active problems to display for this patient.   History reviewed. No pertinent surgical history.  Prior to Admission medications   Medication Sig Start Date End Date Taking? Authorizing Provider  acetaminophen (TYLENOL) 325 MG tablet Take  650 mg by mouth every 6 (six) hours as needed for mild pain.    [provider]  diclofenac (VOLTAREN) 75 MG EC tablet Take 1 tablet (75 mg total) by mouth 2 (two) times daily. Patient not taking: Reported on 01/20/2017 01/14/17   Elvina SidleLauenstein, Kurt, MD  famotidine (PEPCID) 20 MG tablet Take 1 tablet (20 mg total) by mouth 2 (two) times daily. 10/23/18   Henderly, Britni A, PA-C  ibuprofen (ADVIL,MOTRIN) 600 MG tablet Take 1 tablet (600 mg total) by mouth every 6 (six) hours as needed. 01/20/17   Audry PiliMohr, Tyler, PA-C  ondansetron (ZOFRAN ODT) 4 MG disintegrating tablet Take 1 tablet (4 mg total) by mouth every 8 (eight) hours as needed for nausea or vomiting. 08/22/18   Henderly, Britni A, PA-C  promethazine (PHENERGAN) 25 MG tablet Take 1 tablet (25 mg total) by mouth every 6 (six) hours as needed for nausea or vomiting. 10/23/18   Henderly, Britni A, PA-C  sucralfate (CARAFATE) 1 g tablet Take 1 tablet (1 g total) by mouth 4 (four) times daily -  with meals and at bedtime for 10 days. 10/23/18 11/02/18  Henderly, Britni A, PA-C  Triprolidine-Pseudoephedrine (ANTIHISTAMINE PO) Take 3 tablets by mouth daily as needed (allergies).    [provider]    Allergies Shellfish allergy  No family history on file.  Social History Social History   Tobacco Use   Smoking status: Current Every Day Smoker    Types: Cigarettes   Smokeless tobacco: Never Used  Substance Use Topics  Alcohol use: Yes   Drug use: Yes    Types: Marijuana    Review of Systems  Review of Systems  Constitutional: Positive for fatigue. Negative for fever.  HENT: Negative for congestion and sore throat.   Eyes: Negative for visual disturbance.  Respiratory: Negative for cough and shortness of breath.   Cardiovascular: Negative for chest pain.  Gastrointestinal: Positive for nausea and vomiting. Negative for abdominal pain and diarrhea.  Genitourinary: Negative for flank pain.  Musculoskeletal: Negative for  back pain and neck pain.  Skin: Negative for rash and wound.  Neurological: Positive for weakness and light-headedness.  All other systems reviewed and are negative.    ____________________________________________  PHYSICAL EXAM:      VITAL SIGNS: ED Triage Vitals  Enc Vitals Group     BP 03/02/19 0655 (!) 158/108     Pulse Rate 03/02/19 0655 96     Resp 03/02/19 0655 16     Temp 03/02/19 0655 98.3 F (36.8 C)     Temp Source 03/02/19 0655 Oral     SpO2 03/02/19 0655 100 %     Weight 03/02/19 0659 185 lb (83.9 kg)     Height 03/02/19 0659 5\' 3"  (1.6 m)     Head Circumference --      Peak Flow --      Pain Score 03/02/19 0657 10     Pain Loc --      Pain Edu? --      Excl. in GC? --      Physical Exam Vitals signs and nursing note reviewed.  Constitutional:      General: She is not in acute distress.    Appearance: She is well-developed.  HENT:     Head: Normocephalic and atraumatic.     Comments: Dry mucous membranes Eyes:     Conjunctiva/sclera: Conjunctivae normal.  Neck:     Musculoskeletal: Neck supple.  Cardiovascular:     Rate and Rhythm: Normal rate and regular rhythm.     Heart sounds: Normal heart sounds. No murmur. No friction rub.  Pulmonary:     Effort: Pulmonary effort is normal. No respiratory distress.     Breath sounds: Normal breath sounds. No wheezing or rales.  Abdominal:     General: There is no distension.     Palpations: Abdomen is soft.     Tenderness: There is no abdominal tenderness.     Comments: Mild, diffuse tenderness, distractible  Skin:    General: Skin is warm.     Capillary Refill: Capillary refill takes less than 2 seconds.  Neurological:     Mental Status: She is alert and oriented to person, place, and time.     Motor: No abnormal muscle tone.       ____________________________________________   LABS (all labs ordered are listed, but only abnormal results are displayed)  Labs Reviewed  BASIC METABOLIC PANEL -  Abnormal; Notable for the following components:      Result Value   CO2 21 (*)    Glucose, Bld 206 (*)    All other components within normal limits  CBC - Abnormal; Notable for the following components:   WBC 15.6 (*)    All other components within normal limits  URINALYSIS, COMPLETE (UACMP) WITH MICROSCOPIC - Abnormal; Notable for the following components:   Color, Urine YELLOW (*)    APPearance HAZY (*)    Glucose, UA >=500 (*)    Hgb urine dipstick MODERATE (*)  Ketones, ur 20 (*)    Protein, ur 30 (*)    Leukocytes,Ua SMALL (*)    RBC / HPF >50 (*)    All other components within normal limits  URINE DRUG SCREEN, QUALITATIVE ([facility] ONLY) - Abnormal; Notable for the following components:   Opiate, Ur Screen POSITIVE (*)    Cannabinoid 50 Ng, Ur [place] POSITIVE (*)    All other components within normal limits  NOVEL CORONAVIRUS, NAA (HOSPITAL ORDER, SEND-OUT TO REF LAB)  URINE CULTURE  MAGNESIUM  PREGNANCY, URINE  TROPONIN I (HIGH SENSITIVITY)  TROPONIN I (HIGH SENSITIVITY)  CBG MONITORING, ED    ____________________________________________  EKG: Normal sinus rhythm, PR 196.  QRS 91.  QTc 526.  No apparent acute ischemic changes.  EKG #2: 8:20 AM: Heart rate 117, PR 121, QTc 518.  Diffuse T wave inversions noted, particular along inferolateral leads.  Since last EKG, T waves are more inverted diffusely. ________________________________________  RADIOLOGY All imaging, including plain films, CT scans, and ultrasounds, independently reviewed by me, and interpretations confirmed via formal radiology reads.  ED MD interpretation:   None  Official radiology report(s): No results found.  ____________________________________________  PROCEDURES   Procedure(s) performed (including Critical Care):  Procedures  ____________________________________________  INITIAL IMPRESSION / MDM / ASSESSMENT AND PLAN / ED COURSE  As part of my medical decision making, I reviewed the  following data within the electronic MEDICAL RECORD NUMBER Notes from prior ED visits and [place] Controlled Substance Database      *Brandi [name] was evaluated in Emergency Department on 03/02/2019 for the symptoms described in the history of present illness. She was evaluated in the context of the global COVID-19 pandemic, which necessitated consideration that the patient might be at risk for infection with the SARS-CoV-2 virus that causes COVID-19. Institutional protocols and algorithms that pertain to the evaluation of patients at risk for COVID-19 are in a state of rapid change based on information released by regulatory bodies including the CDC and federal and state organizations. These policies and algorithms were followed during the patient's care in the ED.  Some ED evaluations and interventions may be delayed as a result of limited staffing during the pandemic.*   Clinical Course as of Mar 01 952  Thu Mar 02, 2019  0737 23 yo F here with recurrent syncope, n/v, weakness. While her history is c/w vasovagal syncope and possible mild orthostasis given recurrence with her painful periods and poor PO intake, pt does have apparent prolonged QT on EKG. No old EKGs to compare. Denies family h/o long QT or SCD. Will check screening labs. This could be 2/2 her antiemetic use at home, but will monitor on tele as well.   [CI]  0738 Toradol, ativan given for pain and nausea 2/2 QT prolongation.   [CI]  [id number] Patient persistently tachycardic and vomiting after fluids, Toradol, Ativan.  Unable to give additional antiemetics other than Ativan secondary to prolonged QT and persistent tachycardia.  Troponin negative, which is reassuring, and I suspect her EKG changes as well as tachycardia are secondary to dehydration and possible antiemetic as well as substance use.  Given her persistent vomiting, tachycardia, prolonged QT, and syncope, will plan to admit for observation.   [CI]  [id number] Of note, UA also shows pyuria and  she does have some suprapubic tenderness with mild dysuria.  Will give Rocephin and send urine culture.   [CI]    Clinical Course User Index [CI] Shaune PollackIsaacs, Zahriah Roes, MD    Medical  Decision Making: As above.  ____________________________________________  FINAL CLINICAL IMPRESSION(S) / ED DIAGNOSES  Final diagnoses:  Acute cystitis with hematuria  Dehydration  Prolonged Q-T interval on ECG     MEDICATIONS GIVEN DURING THIS VISIT:  Medications  lactated ringers infusion (has no administration in time range)  capsaicin (ZOSTRIX) 0.025 % cream ( Topical Given 03/02/19 0944)  cefTRIAXone (ROCEPHIN) 2 g in sodium chloride 0.9 % 100 mL IVPB (2 g Intravenous New Bag/Given 03/02/19 0940)  sodium chloride flush (NS) 0.9 % injection 3 mL (3 mLs Intravenous Given 03/02/19 0816)  sodium chloride 0.9 % bolus 1,000 mL (1,000 mLs Intravenous New Bag/Given 03/02/19 0739)  sodium chloride 0.9 % bolus 1,000 mL (1,000 mLs Intravenous New Bag/Given 03/02/19 0740)  ketorolac (TORADOL) 30 MG/ML injection 15 mg (15 mg Intravenous Given 03/02/19 0744)  LORazepam (ATIVAN) injection 1 mg (1 mg Intravenous Given 03/02/19 0744)  magnesium sulfate IVPB 2 g 50 mL (0 g Intravenous Stopped 03/02/19 0935)  LORazepam (ATIVAN) injection 1 mg (1 mg Intravenous Given 03/02/19 0940)     ED Discharge Orders    None       Note:  This document was prepared using Dragon voice recognition software and may include unintentional dictation errors.   Duffy Bruce, MD 03/02/19 [phone]

## 2019-03-02 NOTE — ED Triage Notes (Signed)
Pt presents to ED after she had a possible syncopal episode. Pt significant other states pt "passess out when she has her period because of the cramps". She states this time it seemed like she wasn't breathing. Period started yesterday. Pt c/o lower abd cramping. Hx of the same.

## 2019-03-02 NOTE — Progress Notes (Signed)
*  PRELIMINARY RESULTS* Echocardiogram 2D Echocardiogram has been performed.  Brandi [name] 03/02/2019, 2:30 PM

## 2019-03-02 NOTE — ED Notes (Addendum)
Advised to hold pt, per RN  [facility] doesn't have bed in room and will call once they get one. ED Charge Steph RN notified.

## 2019-03-02 NOTE — ED Notes (Signed)
Pt states that she cannot walk due to weakness. Requested urine sample and offered bedpan. Pt walks with minimal assistance to bedside commode. This RN stayed at pt bedside due to high fall risk.

## 2019-03-02 NOTE — ED Notes (Signed)
Started period yesterday, nausea and emesis since. Near syncope at home. States this happens every month.

## 2019-03-02 NOTE — ED Notes (Signed)
ED TO INPATIENT HANDOFF REPORT  ED Nurse Name and Phone #: ally [phone]  S Name/Age/Gender Brandi [name] 23 y.o. female Room/Bed: ED15A/ED15A  Code Status   Code Status: Full Code  Home/SNF/Other Home Patient oriented to: self, place, time and situation Is this baseline? Yes   Triage Complete: Triage complete  Chief Complaint Not Breathing , Abdominal Pain and cramps, Vomitting  Triage Note Pt presents to ED after she had a possible syncopal episode. Pt significant other states pt "passess out when she has her period because of the cramps". She states this time it seemed like she wasn't breathing. Period started yesterday. Pt c/o lower abd cramping. Hx of the same.    Allergies Allergies  Allergen Reactions  . Shellfish Allergy Anaphylaxis    Level of Care/Admitting Diagnosis ED Disposition    ED Disposition Condition Comment   Admit  Hospital [facility]: [facility] [[id number]]  Level of Care: Telemetry [5]  Covid Evaluation: Screening Protocol (No Symptoms)  Diagnosis: Syncope [206001]  Admitting Physician: Jama FlavorsJIE, JUDE [[id number]]  Attending Physician: Jama FlavorsJIE, JUDE [[id number]]  PT Class (Do Not Modify): Observation [104]  PT Acc Code (Do Not Modify): Observation [10022]       B Medical/Surgery History Past Medical History:  Diagnosis Date  . Asthma    History reviewed. No pertinent surgical history.   A IV Location/Drains/Wounds Patient Lines/Drains/Airways Status   Active Line/Drains/Airways    Name:   Placement date:   Placement time:   Site:   Days:   Peripheral IV 03/02/19 Left Antecubital   03/02/19    0708    Antecubital   less than 1          Intake/Output Last 24 hours  Intake/Output Summary (Last 24 hours) at 03/02/2019 1205 Last data filed at 03/02/2019 1002 Gross per 24 hour  Intake 2000 ml  Output -  Net 2000 ml    Labs/Imaging Results for orders placed or performed during the hospital encounter of 03/02/19 (from the past 48 hour(s))   Basic metabolic panel     Status: Abnormal   Collection Time: 03/02/19  7:08 AM  Result Value Ref Range   Sodium 140 135 - 145 mmol/L   Potassium 3.7 3.5 - 5.1 mmol/L   Chloride 107 98 - 111 mmol/L   CO2 21 (L) 22 - 32 mmol/L   Glucose, Bld 206 (H) 70 - 99 mg/dL   BUN 13 6 - 20 mg/dL   Creatinine, Ser 9.600.80 0.44 - 1.00 mg/dL   Calcium 9.4 8.9 - 45.410.3 mg/dL   GFR calc non Af Amer >60 >60 mL/min   GFR calc Af Amer >60 >60 mL/min   Anion gap 12 5 - 15    Comment: Performed at [facility], [address] [place], [place] [address]  CBC     Status: Abnormal   Collection Time: 03/02/19  7:08 AM  Result Value Ref Range   WBC 15.6 (H) 4.0 - 10.5 K/uL   RBC 4.24 3.87 - 5.11 MIL/uL   Hemoglobin 12.8 12.0 - 15.0 g/dL   HCT 91.438.9 78.236.0 - 95.646.0 %   MCV 91.7 80.0 - 100.0 fL   MCH 30.2 26.0 - 34.0 pg   MCHC 32.9 30.0 - 36.0 g/dL   RDW 21.312.4 08.611.5 - 57.815.5 %   Platelets 375 150 - 400 K/uL   nRBC 0.0 0.0 - 0.2 %    Comment: Performed at [facility], [address] [place], [place] [address]  Urinalysis, Complete w Microscopic     Status: Abnormal   Collection Time: 03/02/19  7:08 AM  Result Value Ref Range   Color, Urine YELLOW (A) YELLOW   APPearance HAZY (A) CLEAR   Specific Gravity, Urine 1.030 1.005 - 1.030   pH 7.0 5.0 - 8.0   Glucose, UA >=500 (A) NEGATIVE mg/dL   Hgb urine dipstick MODERATE (A) NEGATIVE   Bilirubin Urine NEGATIVE NEGATIVE   Ketones, ur 20 (A) NEGATIVE mg/dL   Protein, ur 30 (A) NEGATIVE mg/dL   Nitrite NEGATIVE NEGATIVE   Leukocytes,Ua SMALL (A) NEGATIVE   RBC / HPF >50 (H) 0 - 5 RBC/hpf   WBC, UA 11-20 0 - 5 WBC/hpf   Bacteria, UA NONE SEEN NONE SEEN   Squamous Epithelial / LPF 0-5 0 - 5   Mucus PRESENT     Comment: Performed at [facility], [address] [place], [place] [address]  Magnesium     Status: None   Collection Time: 03/02/19  7:08 AM  Result Value Ref Range   Magnesium 1.8 1.7 - 2.4 mg/dL    Comment: Performed at  [facility], [address] [place], [place] [address]  Pregnancy, urine     Status: None   Collection Time: 03/02/19  7:08 AM  Result Value Ref Range   Preg Test, Ur NEGATIVE NEGATIVE    Comment: Performed at [facility], [address] [place], [place] [address]  Urine Drug Screen, Qualitative ([facility] only)     Status: Abnormal   Collection Time: 03/02/19  7:08 AM  Result Value Ref Range   Tricyclic, Ur Screen NONE DETECTED NONE DETECTED   Amphetamines, Ur Screen NONE DETECTED NONE DETECTED   MDMA (Ecstasy)Ur Screen NONE DETECTED NONE DETECTED   Cocaine Metabolite,Ur [place] NONE DETECTED NONE DETECTED   Opiate, Ur Screen POSITIVE (A) NONE DETECTED   Phencyclidine (PCP) Ur S NONE DETECTED NONE DETECTED   Cannabinoid 50 Ng, Ur [place] POSITIVE (A) NONE DETECTED   Barbiturates, Ur Screen NONE DETECTED NONE DETECTED   Benzodiazepine, Ur Scrn NONE DETECTED NONE DETECTED   Methadone Scn, Ur NONE DETECTED NONE DETECTED    Comment: (NOTE) Tricyclics + metabolites, urine    Cutoff 1000 ng/mL Amphetamines + metabolites, urine  Cutoff 1000 ng/mL MDMA (Ecstasy), urine              Cutoff 500 ng/mL Cocaine Metabolite, urine          Cutoff 300 ng/mL Opiate + metabolites, urine        Cutoff 300 ng/mL Phencyclidine (PCP), urine         Cutoff 25 ng/mL Cannabinoid, urine                 Cutoff 50 ng/mL Barbiturates + metabolites, urine  Cutoff 200 ng/mL Benzodiazepine, urine              Cutoff 200 ng/mL Methadone, urine                   Cutoff 300 ng/mL The urine drug screen provides only a preliminary, unconfirmed analytical test result and should not be used for non-medical purposes. Clinical consideration and professional judgment should be applied to any positive drug screen result due to possible interfering substances. A more specific alternate chemical method must be used in order to obtain a confirmed analytical result. Gas chromatography / mass spectrometry (GC/MS) is  the preferred confirmat ory method. Performed at [facility], [phone] [place], [place] [address]   Troponin I (High Sensitivity)     Status: None   Collection Time: 03/02/19  7:08 AM  Result Value Ref Range   Troponin I (High Sensitivity) <2 <18 ng/L    Comment: (NOTE) Elevated high sensitivity troponin I (hsTnI) values and significant  changes across serial measurements may suggest ACS but many other  chronic and acute conditions are known to elevate hsTnI results.  Refer to the "Links" section for chest pain algorithms and additional  guidance. Performed at [facility], [address] [place], [place] [address]   Troponin I (High Sensitivity)     Status: None   Collection Time: 03/02/19 10:55 AM  Result Value Ref Range   Troponin I (High Sensitivity) 4 <18 ng/L    Comment: (NOTE) Elevated high sensitivity troponin I (hsTnI) values and significant  changes across serial measurements may suggest ACS but many other  chronic and acute conditions are known to elevate hsTnI results.  Refer to the "Links" section for chest pain algorithms and additional  guidance. Performed at [facility], [address] [place], [place] [address]    [place] Carotid Bilateral  Result Date: 03/02/2019 CLINICAL DATA:  Syncopal episode.  History of smoking. EXAM: BILATERAL CAROTID DUPLEX ULTRASOUND TECHNIQUE: Wallace CullensGray scale imaging, color Doppler and duplex ultrasound were performed of bilateral carotid and vertebral arteries in the neck. COMPARISON:  None. FINDINGS: Criteria: Quantification of carotid stenosis is based on velocity parameters that correlate the residual internal carotid diameter with NASCET-based stenosis levels, using the diameter of the distal internal carotid lumen as the denominator for stenosis measurement. The following velocity measurements were obtained: RIGHT ICA: 101/39 cm/sec CCA: 113/21 cm/sec SYSTOLIC ICA/CCA RATIO:  0.9 ECA: 165 cm/sec LEFT  ICA: 113/47 cm/sec CCA: 126/18 cm/sec SYSTOLIC ICA/CCA RATIO:  0.9 ECA: 130 cm/sec RIGHT CAROTID ARTERY: There is no grayscale evidence of significant intimal thickening or atherosclerotic plaque affecting the interrogated portions of the right carotid system. There are no elevated peak systolic velocities within the interrogated course of the right internal carotid artery to suggest a hemodynamically significant stenosis. No discrete areas of vessel irregularity or wall thickening. RIGHT VERTEBRAL ARTERY:  Antegrade flow. LEFT CAROTID ARTERY: There is no grayscale evidence of significant intimal thickening or atherosclerotic plaque affecting the interrogated portions of the left carotid system. There are no elevated peak systolic velocities within the interrogated course the left internal carotid artery to suggest a hemodynamically significant stenosis. No discrete areas of vessel irregularity or wall thickening. LEFT VERTEBRAL ARTERY:  Antegrade Flow Incidental note made of a benign appearing non pathologically enlarged left submandibular lymph node which is not enlarged by size criteria (measuring 0.5 cm in greatest short axis diameter) and maintains a benign fatty hilum. IMPRESSION: Normal carotid Doppler ultrasound. Specifically, discrete areas of vessel irregularity or wall thickening. Electronically Signed   By: Simonne ComeJohn  Watts M.D.   On: 03/02/2019 11:48    Pending Labs Unresulted Labs (From admission, onward)    Start     Ordered   03/03/19 0500  Basic metabolic panel  Tomorrow morning,   STAT     03/02/19 1034   03/03/19 0500  CBC  Tomorrow morning,   STAT     03/02/19 1034   03/03/19 0500  Magnesium  Tomorrow morning,   STAT     03/02/19 1034   03/03/19 0500  Phosphorus  Tomorrow morning,   STAT     03/02/19 1034   03/02/19  1054  Urine culture  Add-on,   AD    Question:  Patient immune status  Answer:  Normal   03/02/19 1053   03/02/19 1034  Troponin I (High Sensitivity)  ONCE - STAT,   STAT     Question:  Indication  Answer:  Suspect ACS   03/02/19 1034   03/02/19 1028  HIV antibody (Routine Testing)  Once,   STAT     03/02/19 1030   03/02/19 0907  Novel Coronavirus,NAA,(SEND-OUT TO REF LAB - TAT 24-48 hrs); Hosp Order  (Asymptomatic Patients Labs)  Once,   STAT    Question:  Rule Out  Answer:  Yes   03/02/19 0906          Vitals/Pain Today's Vitals   03/02/19 0930 03/02/19 1030 03/02/19 1100 03/02/19 1200  BP: 134/73 118/81 131/81 129/80  Pulse: (!) 105 (!) 106 (!) 117 (!) 110  Resp: 20 (!) 21 19 14   Temp:    98.5 F (36.9 C)  TempSrc:    Oral  SpO2: 100% 99% 100% 100%  Weight:      Height:      PainSc:    0-No pain    Isolation Precautions No active isolations  Medications Medications  capsaicin (ZOSTRIX) 0.025 % cream ( Topical Given 03/02/19 0944)  enoxaparin (LOVENOX) injection 40 mg (has no administration in time range)  0.9 %  sodium chloride infusion (1,000 mLs Intravenous New Bag/Given 03/02/19 1201)  cefTRIAXone (ROCEPHIN) 1 g in sodium chloride 0.9 % 100 mL IVPB (has no administration in time range)  sodium chloride flush (NS) 0.9 % injection 3 mL (3 mLs Intravenous Given 03/02/19 0816)  sodium chloride 0.9 % bolus 1,000 mL (0 mLs Intravenous Stopped 03/02/19 1002)  sodium chloride 0.9 % bolus 1,000 mL (0 mLs Intravenous Stopped 03/02/19 1002)  ketorolac (TORADOL) 30 MG/ML injection 15 mg (15 mg Intravenous Given 03/02/19 0744)  LORazepam (ATIVAN) injection 1 mg (1 mg Intravenous Given 03/02/19 0744)  magnesium sulfate IVPB 2 g 50 mL (0 g Intravenous Stopped 03/02/19 0935)  LORazepam (ATIVAN) injection 1 mg (1 mg Intravenous Given 03/02/19 0940)  cefTRIAXone (ROCEPHIN) 2 g in sodium chloride 0.9 % 100 mL IVPB (0 g Intravenous Stopped 03/02/19 1020)    Mobility walks Moderate fall risk   Focused Assessments Cardiac Assessment Handoff:    No results found for: CKTOTAL, CKMB, CKMBINDEX, TROPONINI No results found for: DDIMER Does the Patient currently have  chest pain? No      R Recommendations: See Admitting Provider Note  Report given to:   Additional Notes: pt c/o emesis after starting period as she does monthly, near syncope and elevated HR. Treating for mild UTI and elevated HR. Pt alert and oriented X4, active, cooperative, pt in NAD. RR even and unlabored, color WNL.

## 2019-03-02 NOTE — ED Notes (Addendum)
Pt sitting on toilet for BM, provided urine sample. Dry heaving in emesis bag. Assisted back to bed, awaiting EDP assessment. Pt alert and oriented X4, active, cooperative, pt in NAD. RR even and unlabored, color WNL.

## 2019-03-02 NOTE — Progress Notes (Signed)
   03/02/19 1600  Clinical Encounter Type  Visited With Patient  Visit Type Initial  Referral From Nurse  Consult/Referral To Other (Comment)  Spiritual Encounters  Spiritual Needs Brochure  Ch received an OR for AD education. Pt was about to go to the restroom and wanted ch to leave the document for her to review. Pt will call the ch if she has any questions or wants to complete the document.

## 2019-03-03 DIAGNOSIS — R55 Syncope and collapse: Secondary | ICD-10-CM

## 2019-03-03 LAB — BASIC METABOLIC PANEL
Anion gap: 5 (ref 5–15)
BUN: 7 mg/dL (ref 6–20)
CO2: 21 mmol/L — ABNORMAL LOW (ref 22–32)
Calcium: 7.8 mg/dL — ABNORMAL LOW (ref 8.9–10.3)
Chloride: 114 mmol/L — ABNORMAL HIGH (ref 98–111)
Creatinine, Ser: 0.69 mg/dL (ref 0.44–1.00)
GFR calc Af Amer: >60 mL/min (ref >60)
GFR calc non Af Amer: >60 mL/min (ref >60)
Glucose, Bld: 101 mg/dL — ABNORMAL HIGH (ref 70–99)
Potassium: 3.2 mmol/L — ABNORMAL LOW (ref 3.5–5.1)
Sodium: 140 mmol/L (ref 135–145)

## 2019-03-03 LAB — CBC
HCT: 31.6 % — ABNORMAL LOW (ref 36.0–46.0)
Hemoglobin: 10.4 g/dL — ABNORMAL LOW (ref 12.0–15.0)
MCH: 30.3 pg (ref 26.0–34.0)
MCHC: 32.9 g/dL (ref 30.0–36.0)
MCV: 92.1 fL (ref 80.0–100.0)
Platelets: 275 10*3/uL (ref 150–400)
RBC: 3.43 MIL/uL — ABNORMAL LOW (ref 3.87–5.11)
RDW: 12.8 % (ref 11.5–15.5)
WBC: 9.6 10*3/uL (ref 4.0–10.5)
nRBC: 0 % (ref 0.0–0.2)

## 2019-03-03 LAB — URINE CULTURE: Special Requests: NORMAL

## 2019-03-03 LAB — MAGNESIUM: Magnesium: 2.4 mg/dL (ref 1.7–2.4)

## 2019-03-03 LAB — NOVEL CORONAVIRUS, NAA (HOSP ORDER, SEND-OUT TO REF LAB; TAT 18-24 HRS): SARS-CoV-2, NAA: NOT DETECTED

## 2019-03-03 LAB — PHOSPHORUS: Phosphorus: 2.8 mg/dL (ref 2.5–4.6)

## 2019-03-03 MED ORDER — POTASSIUM CHLORIDE CRYS ER 20 MEQ PO TBCR
40.0000 meq | EXTENDED_RELEASE_TABLET | Freq: Once | ORAL | Status: AC
  Administered 2019-03-03: 40 meq via ORAL
  Filled 2019-03-03: qty 2

## 2019-03-03 NOTE — Progress Notes (Signed)
Discharge instructions explained to pt / verbalized an understanding/ iv and tele removed/ will transport off unit via wheelchair when ride arrives.  

## 2019-03-03 NOTE — Plan of Care (Signed)
  Problem: Activity: Goal: Risk for activity intolerance will decrease Outcome: Progressing   Problem: Pain Managment: Goal: General experience of comfort will improve Outcome: Progressing   

## 2019-03-03 NOTE — Discharge Summary (Signed)
[facility] [place] NAME: Brandi [name]    MR#:  [id number]  DATE OF BIRTH:  Nov 15, 1995  DATE OF ADMISSION:  03/02/2019   ADMITTING PHYSICIAN: Otila Back, MD  DATE OF DISCHARGE: 03/03/2019  PRIMARY CARE PHYSICIAN: Patient, No Pcp Per   ADMISSION DIAGNOSIS:  Dehydration [E86.0] Syncope [R55] Prolonged Q-T interval on ECG [R94.31] Acute cystitis with hematuria [N30.01] DISCHARGE DIAGNOSIS:  Active Problems:   Syncope  SECONDARY DIAGNOSIS:   Past Medical History:  Diagnosis Date   Asthma    HOSPITAL COURSE:  Chief complaint; nausea and vomiting  History of presenting complaint; Brandi [name]  is a 23 y.o. female with a known history of asthma who presented to the emergency room with complaints of nausea and vomiting and feeling of lightheaded over the last several days.  This usually happens to her during her menstrual cycle.  She just started her menstrual cycle 1 day prior to admission .  She reported having an episode where she passed out while laying in bed on her friend was having difficulty arousing her.  She does not have full recollection of the events.  Denies any chest pain.  No shortness of breath.  No fevers.  Patient was evaluated in the emergency room and pregnancy test done was negative.  Urinalysis suggestive for UTI.  Patient given a dose of Rocephin.  Patient noted to be tachycardic with heart rate of 117.  Twelve-lead EKG with prolonged QTC of 518.  IV fluids started.  Medical service called to admit patient for further evaluation.   Hospital course; 1.  Syncope. Patient had an episode of syncope.  Had been having nausea and vomiting and feeling lightheaded prior to passing out.  Syncope most vasovagal.  Initially noted to have prolonged QTC on twelve-lead EKG. repeat twelve-lead EKG done this morning with significantly improved QTc interval down to 425.  No events reported on telemetry.  Acute coronary syndrome ruled  out.  2D echocardiogram done with normal ejection fraction of 50 to 55%.  Carotid Doppler ultrasound with no evidence of any hemodynamically significant stenosis.  Patient clinically and hemodynamically stable.  Wishes to be discharged home today. COVID test was requested for screening purposes but results still pending but patient completely asymptomatic  2.  Dehydration Adequately hydrated with IV fluids  3.  Urinary tract infection Started empirically on IV Rocephin.  IV fluid hydration.  Urine culture came back as a contaminated specimen.  Patient is not symptomatic.  No indication for continued antibiotics.  4.  History of asthma Stable   DISCHARGE CONDITIONS:  Stable CONSULTS OBTAINED:   DRUG ALLERGIES:   Allergies  Allergen Reactions   Shellfish Allergy Anaphylaxis   DISCHARGE MEDICATIONS:   Allergies as of 03/03/2019      Reactions   Shellfish Allergy Anaphylaxis      Medication List    STOP taking these medications   diclofenac 75 MG EC tablet Commonly known as: VOLTAREN   ibuprofen 600 MG tablet Commonly known as: ADVIL   promethazine 25 MG tablet Commonly known as: PHENERGAN     TAKE these medications   acetaminophen 325 MG tablet Commonly known as: TYLENOL Take 650 mg by mouth every 6 (six) hours as needed for mild pain.   ANTIHISTAMINE PO Take 3 tablets by mouth daily as needed (allergies).   famotidine 20 MG tablet Commonly known as: PEPCID Take 1 tablet (20 mg total) by mouth 2 (two) times daily.  ondansetron 4 MG disintegrating tablet Commonly known as: Zofran ODT Take 1 tablet (4 mg total) by mouth every 8 (eight) hours as needed for nausea or vomiting.   sucralfate 1 g tablet Commonly known as: Carafate Take 1 tablet (1 g total) by mouth 4 (four) times daily -  with meals and at bedtime for 10 days.        DISCHARGE INSTRUCTIONS:   DIET:  Regular diet DISCHARGE CONDITION:  Stable ACTIVITY:  Activity as tolerated OXYGEN:    Home Oxygen: No.  Oxygen Delivery: room air DISCHARGE LOCATION:  home   If you experience worsening of your admission symptoms, develop shortness of breath, life threatening emergency, suicidal or homicidal thoughts you must seek medical attention immediately by calling 911 or calling your MD immediately  if symptoms less severe.  You Must read complete instructions/literature along with all the possible adverse reactions/side effects for all the Medicines you take and that have been prescribed to you. Take any new Medicines after you have completely understood and accpet all the possible adverse reactions/side effects.   Please note  You were cared for by a hospitalist during your hospital stay. If you have any questions about your discharge medications or the care you received while you were in the hospital after you are discharged, you can call the unit and asked to speak with the hospitalist on call if the hospitalist that took care of you is not available. Once you are discharged, your primary care physician will handle any further medical issues. Please note that NO REFILLS for any discharge medications will be authorized once you are discharged, as it is imperative that you return to your primary care physician (or establish a relationship with a primary care physician if you do not have one) for your aftercare needs so that they can reassess your need for medications and monitor your lab values.    On the day of Discharge:  VITAL SIGNS:  Blood pressure 130/79, pulse 94, temperature 98.9 F (37.2 C), temperature source Oral, resp. rate 20, height 5\' 3"  (1.6 m), weight 83.9 kg, last menstrual period 03/01/2019, SpO2 100 %. PHYSICAL EXAMINATION:  GENERAL:  [age] y.o.-year-old patient lying in the bed with no acute distress.  EYES: Pupils equal, round, reactive to light and accommodation. No scleral icterus. Extraocular muscles intact.  HEENT: Head atraumatic, normocephalic. Oropharynx and  nasopharynx clear.  NECK:  Supple, no jugular venous distention. No thyroid enlargement, no tenderness.  LUNGS: Normal breath sounds bilaterally, no wheezing, rales,rhonchi or crepitation. No use of accessory muscles of respiration.  CARDIOVASCULAR: S1, S2 normal. No murmurs, rubs, or gallops.  ABDOMEN: Soft, non-tender, non-distended. Bowel sounds present. No organomegaly or mass.  EXTREMITIES: No pedal edema, cyanosis, or clubbing.  NEUROLOGIC: Cranial nerves II through XII are intact. Muscle strength 5/5 in all extremities. Sensation intact. Gait not checked.  PSYCHIATRIC: The patient is alert and oriented x 3.  SKIN: No obvious rash, lesion, or ulcer.  DATA REVIEW:   CBC Recent Labs  Lab 03/03/19 0626  WBC 9.6  HGB 10.4*  HCT 31.6*  PLT 275    Chemistries  Recent Labs  Lab 03/03/19 0626  NA 140  K 3.2*  CL 114*  CO2 21*  GLUCOSE 101*  BUN 7  CREATININE 0.69  CALCIUM 7.8*  MG 2.4     Microbiology Results  Results for orders placed or performed during the hospital encounter of 03/02/19  Urine culture     Status: Abnormal  Collection Time: 03/02/19  7:08 AM   Specimen: Urine, Clean Catch  Result Value Ref Range Status   Specimen Description   Final    URINE, CLEAN CATCH Performed at [facility], [address] [place], [place] [address]    Special Requests   Final    Normal Performed at [facility], [address] [place], [place] [address]    Culture MULTIPLE SPECIES PRESENT, SUGGEST RECOLLECTION (A)  Final   Report Status 03/03/2019 FINAL  Final    RADIOLOGY:  [place] Carotid Bilateral  Result Date: 03/02/2019 CLINICAL DATA:  Syncopal episode.  History of smoking. EXAM: BILATERAL CAROTID DUPLEX ULTRASOUND TECHNIQUE: Wallace CullensGray scale imaging, color Doppler and duplex ultrasound were performed of bilateral carotid and vertebral arteries in the neck. COMPARISON:  None. FINDINGS: Criteria: Quantification of carotid stenosis is based on velocity  parameters that correlate the residual internal carotid diameter with NASCET-based stenosis levels, using the diameter of the distal internal carotid lumen as the denominator for stenosis measurement. The following velocity measurements were obtained: RIGHT ICA: 101/39 cm/sec CCA: 113/21 cm/sec SYSTOLIC ICA/CCA RATIO:  0.9 ECA: 165 cm/sec LEFT ICA: 113/47 cm/sec CCA: 126/18 cm/sec SYSTOLIC ICA/CCA RATIO:  0.9 ECA: 130 cm/sec RIGHT CAROTID ARTERY: There is no grayscale evidence of significant intimal thickening or atherosclerotic plaque affecting the interrogated portions of the right carotid system. There are no elevated peak systolic velocities within the interrogated course of the right internal carotid artery to suggest a hemodynamically significant stenosis. No discrete areas of vessel irregularity or wall thickening. RIGHT VERTEBRAL ARTERY:  Antegrade flow. LEFT CAROTID ARTERY: There is no grayscale evidence of significant intimal thickening or atherosclerotic plaque affecting the interrogated portions of the left carotid system. There are no elevated peak systolic velocities within the interrogated course the left internal carotid artery to suggest a hemodynamically significant stenosis. No discrete areas of vessel irregularity or wall thickening. LEFT VERTEBRAL ARTERY:  Antegrade Flow Incidental note made of a benign appearing non pathologically enlarged left submandibular lymph node which is not enlarged by size criteria (measuring 0.5 cm in greatest short axis diameter) and maintains a benign fatty hilum. IMPRESSION: Normal carotid Doppler ultrasound. Specifically, discrete areas of vessel irregularity or wall thickening. Electronically Signed   By: Simonne ComeJohn  Watts M.D.   On: 03/02/2019 11:48     Management plans discussed with the patient, family and they are in agreement.  CODE STATUS: Full Code   TOTAL TIME TAKING CARE OF THIS PATIENT: 55 minutes.    Eleri Ruben M.D on 03/03/2019 at 11:07  AM  Between 7am to 6pm - Pager - [phone]  After 6pm go to [email or web address] - Social research officer, [email or web address] [email or web address]  [employer] [facility]  Office  [phone]  CC: Primary care physician; Patient, No Pcp Per   Note: This dictation was prepared with Dragon dictation along with smaller phrase technology. Any transcriptional errors that result from this process are unintentional.

## 2019-03-04 LAB — HIV ANTIBODY (ROUTINE TESTING W REFLEX): HIV Screen 4th Generation wRfx: NONREACTIVE

## 2019-04-03 ENCOUNTER — Emergency Department
Admission: EM | Admit: 2019-04-03 | Discharge: 2019-04-03 | Discharge status: Against Medical Advice | Payer: Self-pay | Attending: Emergency Medicine | Admitting: Emergency Medicine

## 2019-04-03 ENCOUNTER — Encounter: Payer: Self-pay | Admitting: Emergency Medicine

## 2019-04-03 ENCOUNTER — Other Ambulatory Visit: Payer: Self-pay

## 2019-04-03 DIAGNOSIS — Z5321 Procedure and treatment not carried out due to patient leaving prior to being seen by health care provider: Secondary | ICD-10-CM | POA: Insufficient documentation

## 2019-04-03 MED ORDER — ONDANSETRON 4 MG PO TBDP: 4.0000 mg | ORAL_TABLET | Freq: Once | ORAL | Status: DC

## 2019-04-03 NOTE — ED Notes (Signed)
No answer when called several times from lobby 

## 2019-04-03 NOTE — ED Triage Notes (Addendum)
C/O menstrual cramps, onset today.  States that ever since menstruation began at the age of 31, cramps send her to the ED.  Patient states she has not been evaluated by an OBGYN for complaint because, she moves a lot.  States took tylenol today for pain, ineffective.  AAOx3.  Skin warm and dry. Vomiting in triage

## 2019-11-06 ENCOUNTER — Other Ambulatory Visit: Payer: Self-pay

## 2019-11-06 ENCOUNTER — Encounter: Payer: Self-pay | Admitting: Emergency Medicine

## 2019-11-06 ENCOUNTER — Emergency Department
Admission: EM | Admit: 2019-11-06 | Discharge: 2019-11-06 | Disposition: A | Discharge status: Home / Self Care | Payer: BLUE CROSS/BLUE SHIELD | Attending: Emergency Medicine | Admitting: Emergency Medicine

## 2019-11-06 DIAGNOSIS — J45909 Unspecified asthma, uncomplicated: Secondary | ICD-10-CM | POA: Insufficient documentation

## 2019-11-06 DIAGNOSIS — F17210 Nicotine dependence, cigarettes, uncomplicated: Secondary | ICD-10-CM | POA: Insufficient documentation

## 2019-11-06 DIAGNOSIS — R112 Nausea with vomiting, unspecified: Secondary | ICD-10-CM | POA: Diagnosis not present

## 2019-11-06 LAB — CBC
HCT: 37.5 % (ref 36.0–46.0)
Hemoglobin: 12.6 g/dL (ref 12.0–15.0)
MCH: 30.2 pg (ref 26.0–34.0)
MCHC: 33.6 g/dL (ref 30.0–36.0)
MCV: 89.9 fL (ref 80.0–100.0)
Platelets: 378 10*3/uL (ref 150–400)
RBC: 4.17 MIL/uL (ref 3.87–5.11)
RDW: 12.6 % (ref 11.5–15.5)
WBC: 18.7 10*3/uL — ABNORMAL HIGH (ref 4.0–10.5)
nRBC: 0 % (ref 0.0–0.2)

## 2019-11-06 LAB — BASIC METABOLIC PANEL
Anion gap: 11 (ref 5–15)
BUN: 11 mg/dL (ref 6–20)
CO2: 20 mmol/L — ABNORMAL LOW (ref 22–32)
Calcium: 9.1 mg/dL (ref 8.9–10.3)
Chloride: 105 mmol/L (ref 98–111)
Creatinine, Ser: 0.89 mg/dL (ref 0.44–1.00)
GFR calc Af Amer: >60 mL/min (ref >60)
GFR calc non Af Amer: >60 mL/min (ref >60)
Glucose, Bld: 189 mg/dL — ABNORMAL HIGH (ref 70–99)
Potassium: 3.5 mmol/L (ref 3.5–5.1)
Sodium: 136 mmol/L (ref 135–145)

## 2019-11-06 MED ORDER — SODIUM CHLORIDE 0.9 % IV BOLUS
500.0000 mL | Freq: Once | INTRAVENOUS | Status: AC
  Administered 2019-11-06: 09:00:00 500 mL via INTRAVENOUS

## 2019-11-06 MED ORDER — PROMETHAZINE HCL 25 MG/ML IJ SOLN
12.5000 mg | Freq: Once | INTRAMUSCULAR | Status: AC
  Administered 2019-11-06: 10:00:00 12.5 mg via INTRAVENOUS
  Filled 2019-11-06: qty 1

## 2019-11-06 MED ORDER — PROMETHAZINE HCL 12.5 MG PO TABS: 12.5000 mg | ORAL_TABLET | Freq: Four times a day (QID) | ORAL | 0 refills | Status: DC | PRN

## 2019-11-06 MED ORDER — ONDANSETRON HCL 4 MG/2ML IJ SOLN
4.0000 mg | Freq: Once | INTRAMUSCULAR | Status: AC
  Administered 2019-11-06: 4 mg via INTRAVENOUS
  Filled 2019-11-06: qty 2

## 2019-11-06 NOTE — ED Notes (Signed)
Patient ambulatory to use restroom. Reports worsening nausea with ambulation. Repositioned in bed and given warm blanket. Call bell in reach.

## 2019-11-06 NOTE — ED Provider Notes (Signed)
[facility] Emergency Department Provider Note   ____________________________________________    I have reviewed the triage vital signs and the nursing notes.   HISTORY  Chief Complaint Nausea and vomiting    HPI Brandi [name] is a 24 y.o. female who presents with complaints of nausea and vomiting.  Patient reports he has been vomiting all night long.  She reports this happens almost every month when she has her period.  She denies fevers or chills.  She denies abdominal pain besides mild cramping from her menstrual cycle.  No lightheadedness.  Is not take anything for this.  Did notice a small amount of blood, streaks, in her vomitus earlier this morning.  Normal stools  Past Medical History:  Diagnosis Date  . Asthma     Patient Active Problem List   Diagnosis Date Noted  . Syncope 03/02/2019    History reviewed. No pertinent surgical history.  Prior to Admission medications   Medication Sig Start Date End Date Taking? Authorizing Provider  acetaminophen (TYLENOL) 325 MG tablet Take 650 mg by mouth every 6 (six) hours as needed for mild pain.    [provider]  famotidine (PEPCID) 20 MG tablet Take 1 tablet (20 mg total) by mouth 2 (two) times daily. Patient not taking: Reported on 03/02/2019 10/23/18   Henderly, Britni A, PA-C  ondansetron (ZOFRAN ODT) 4 MG disintegrating tablet Take 1 tablet (4 mg total) by mouth every 8 (eight) hours as needed for nausea or vomiting. Patient not taking: Reported on 03/02/2019 08/22/18   Henderly, Britni A, PA-C  sucralfate (CARAFATE) 1 g tablet Take 1 tablet (1 g total) by mouth 4 (four) times daily -  with meals and at bedtime for 10 days. 10/23/18 11/02/18  Henderly, Britni A, PA-C  Triprolidine-Pseudoephedrine (ANTIHISTAMINE PO) Take 3 tablets by mouth daily as needed (allergies).    [provider]     Allergies Shellfish allergy  No family history on file.  Social History Social History     Tobacco Use  . Smoking status: Current Every Day Smoker    Types: Cigarettes  . Smokeless tobacco: Never Used  Substance Use Topics  . Alcohol use: Yes  . Drug use: Yes    Types: Marijuana    Review of Systems  Constitutional: No fever/chills Eyes: No visual changes.  ENT: No sore throat. Cardiovascular: Denies chest pain. Respiratory: Denies shortness of breath. Gastrointestinal: As above Genitourinary: As above Musculoskeletal: Negative for back pain. Skin: Negative for rash. Neurological: Negative for headache   ____________________________________________   PHYSICAL EXAM:  VITAL SIGNS: ED Triage Vitals  Enc Vitals Group     BP 11/06/19 0831 (!) 170/104     Pulse Rate 11/06/19 0831 100     Resp 11/06/19 0831 18     Temp 11/06/19 0831 98.7 F (37.1 C)     Temp Source 11/06/19 0831 Oral     SpO2 11/06/19 0831 100 %     Weight 11/06/19 0832 77.1 kg (170 lb)     Height 11/06/19 0832 1.6 m (5\' 3" )     Head Circumference --      Peak Flow --      Pain Score 11/06/19 0832 0     Pain Loc --      Pain Edu? --      Excl. in [place]? --     Constitutional: Alert and oriented.  Nose: No congestion/rhinnorhea. Mouth/Throat: Mucous membranes are moist.    Cardiovascular: Normal  rate, regular rhythm.   Good peripheral circulation. Respiratory: Normal respiratory effort.  No retractions. Gastrointestinal: Soft and nontender. No distention.  No CVA tenderness.  Musculoskeletal:   Warm and well perfused Neurologic:  Normal speech and language. No gross focal neurologic deficits are appreciated.  Skin:  Skin is warm, dry and intact. No rash noted. Psychiatric: Mood and affect are normal. Speech and behavior are normal.  ____________________________________________   LABS (all labs ordered are listed, but only abnormal results are displayed)  Labs Reviewed  CBC - Abnormal; Notable for the following components:      Result Value   WBC 18.7 (*)    All other  components within normal limits  BASIC METABOLIC PANEL - Abnormal; Notable for the following components:   CO2 20 (*)    Glucose, Bld 189 (*)    All other components within normal limits   ____________________________________________  EKG ED ECG REPORT I, Jene Every, the attending physician, personally viewed and interpreted this ECG.  Date: 11/06/2019  Rhythm: normal sinus rhythm QRS Axis: normal Intervals: normal ST/T Wave abnormalities: normal Narrative Interpretation: no evidence of acute ischemia  ____________________________________________  RADIOLOGY  None ____________________________________________   PROCEDURES  Procedure(s) performed: No  Procedures   Critical Care performed: No ____________________________________________   INITIAL IMPRESSION / ASSESSMENT AND PLAN / ED COURSE  Pertinent labs & imaging results that were available during my care of the patient were reviewed by me and considered in my medical decision making (see chart for details).  Patient presents with nausea vomiting mild abdominal cramping which she associates with her menstrual cycle.  She states this happens on a monthly basis.  Exam is overall reassuring, will give IV fluids, IV Zofran check labs and reevaluate.  Lab work is notable for elevated white blood cell count, this is nonspecific.  ----------------------------------------- 10:52 AM on 11/06/2019 -----------------------------------------  Reevaluated the patient, she is feeling much better, no abdominal pain, no abdominal tenderness palpation.  No further nausea, appropriate for discharge at this time, recommended birth control to avoid menstruating given the symptoms    ____________________________________________   FINAL CLINICAL IMPRESSION(S) / ED DIAGNOSES  Final diagnoses:  Non-intractable vomiting with nausea, unspecified vomiting type        Note:  This document was prepared using Dragon voice  recognition software and may include unintentional dictation errors.   Jene Every, MD 11/06/19 1053

## 2019-11-06 NOTE — ED Triage Notes (Signed)
Pt in via POV from home, reports hematemesis through the night, denies any pain.  Reports hx of same on a monthly basis.  NAD noted at this time.

## 2019-12-02 ENCOUNTER — Emergency Department (HOSPITAL_COMMUNITY)
Admission: EM | Admit: 2019-12-02 | Discharge: 2019-12-03 | Disposition: A | Discharge status: Home / Self Care | Payer: BLUE CROSS/BLUE SHIELD | Attending: Emergency Medicine | Admitting: Emergency Medicine

## 2019-12-02 ENCOUNTER — Encounter (HOSPITAL_COMMUNITY): Payer: Self-pay | Admitting: Emergency Medicine

## 2019-12-02 ENCOUNTER — Other Ambulatory Visit: Payer: Self-pay

## 2019-12-02 DIAGNOSIS — F17210 Nicotine dependence, cigarettes, uncomplicated: Secondary | ICD-10-CM | POA: Diagnosis not present

## 2019-12-02 DIAGNOSIS — R112 Nausea with vomiting, unspecified: Secondary | ICD-10-CM | POA: Insufficient documentation

## 2019-12-02 DIAGNOSIS — J45909 Unspecified asthma, uncomplicated: Secondary | ICD-10-CM | POA: Diagnosis not present

## 2019-12-02 NOTE — ED Triage Notes (Signed)
Pt to ED with c/o nausea and vomiting since last pm.  Pt st's she has this monthly when she is on her period.

## 2019-12-03 ENCOUNTER — Emergency Department
Admission: EM | Admit: 2019-12-03 | Discharge: 2019-12-04 | Disposition: A | Discharge status: Home / Self Care | Payer: BLUE CROSS/BLUE SHIELD | Attending: Emergency Medicine | Admitting: Emergency Medicine

## 2019-12-03 DIAGNOSIS — R1010 Upper abdominal pain, unspecified: Secondary | ICD-10-CM | POA: Diagnosis present

## 2019-12-03 DIAGNOSIS — E860 Dehydration: Secondary | ICD-10-CM

## 2019-12-03 DIAGNOSIS — Z79899 Other long term (current) drug therapy: Secondary | ICD-10-CM | POA: Diagnosis not present

## 2019-12-03 DIAGNOSIS — F17210 Nicotine dependence, cigarettes, uncomplicated: Secondary | ICD-10-CM | POA: Diagnosis not present

## 2019-12-03 DIAGNOSIS — R112 Nausea with vomiting, unspecified: Secondary | ICD-10-CM

## 2019-12-03 DIAGNOSIS — J45909 Unspecified asthma, uncomplicated: Secondary | ICD-10-CM | POA: Diagnosis not present

## 2019-12-03 LAB — CBC WITH DIFFERENTIAL/PLATELET
Abs Immature Granulocytes: 0.04 10*3/uL (ref 0.00–0.07)
Basophils Absolute: 0 10*3/uL (ref 0.0–0.1)
Basophils Relative: 0 %
Eosinophils Absolute: 0 10*3/uL (ref 0.0–0.5)
Eosinophils Relative: 0 %
HCT: 37.1 % (ref 36.0–46.0)
Hemoglobin: 12.1 g/dL (ref 12.0–15.0)
Immature Granulocytes: 0 %
Lymphocytes Relative: 14 %
Lymphs Abs: 1.7 10*3/uL (ref 0.7–4.0)
MCH: 29.9 pg (ref 26.0–34.0)
MCHC: 32.6 g/dL (ref 30.0–36.0)
MCV: 91.6 fL (ref 80.0–100.0)
Monocytes Absolute: 0.7 10*3/uL (ref 0.1–1.0)
Monocytes Relative: 6 %
Neutro Abs: 9.6 10*3/uL — ABNORMAL HIGH (ref 1.7–7.7)
Neutrophils Relative %: 80 %
Platelets: 394 10*3/uL (ref 150–400)
RBC: 4.05 MIL/uL (ref 3.87–5.11)
RDW: 13.2 % (ref 11.5–15.5)
WBC: 12 10*3/uL — ABNORMAL HIGH (ref 4.0–10.5)
nRBC: 0 % (ref 0.0–0.2)

## 2019-12-03 LAB — COMPREHENSIVE METABOLIC PANEL
ALT: 15 U/L (ref 0–44)
ALT: 15 U/L (ref 0–44)
AST: 14 U/L — ABNORMAL LOW (ref 15–41)
AST: 20 U/L (ref 15–41)
Albumin: 4 g/dL (ref 3.5–5.0)
Albumin: 4.5 g/dL (ref 3.5–5.0)
Alkaline Phosphatase: 61 U/L (ref 38–126)
Alkaline Phosphatase: 63 U/L (ref 38–126)
Anion gap: 12 (ref 5–15)
Anion gap: 13 (ref 5–15)
BUN: 7 mg/dL (ref 6–20)
BUN: 16 mg/dL (ref 6–20)
CO2: 25 mmol/L (ref 22–32)
CO2: 24 mmol/L (ref 22–32)
Calcium: 9.4 mg/dL (ref 8.9–10.3)
Calcium: 9.3 mg/dL (ref 8.9–10.3)
Chloride: 104 mmol/L (ref 98–111)
Chloride: 102 mmol/L (ref 98–111)
Creatinine, Ser: 0.88 mg/dL (ref 0.44–1.00)
Creatinine, Ser: 0.93 mg/dL (ref 0.44–1.00)
GFR calc Af Amer: >60 mL/min (ref >60)
GFR calc Af Amer: >60 mL/min (ref >60)
GFR calc non Af Amer: >60 mL/min (ref >60)
GFR calc non Af Amer: >60 mL/min (ref >60)
Glucose, Bld: 116 mg/dL — ABNORMAL HIGH (ref 70–99)
Glucose, Bld: 127 mg/dL — ABNORMAL HIGH (ref 70–99)
Potassium: 3.5 mmol/L (ref 3.5–5.1)
Potassium: 3 mmol/L — ABNORMAL LOW (ref 3.5–5.1)
Sodium: 141 mmol/L (ref 135–145)
Sodium: 139 mmol/L (ref 135–145)
Total Bilirubin: 0.5 mg/dL (ref 0.3–1.2)
Total Bilirubin: 0.9 mg/dL (ref 0.3–1.2)
Total Protein: 7.6 g/dL (ref 6.5–8.1)
Total Protein: 8.4 g/dL — ABNORMAL HIGH (ref 6.5–8.1)

## 2019-12-03 LAB — CBC
HCT: 37.4 % (ref 36.0–46.0)
Hemoglobin: 12.3 g/dL (ref 12.0–15.0)
MCH: 29.9 pg (ref 26.0–34.0)
MCHC: 32.9 g/dL (ref 30.0–36.0)
MCV: 91 fL (ref 80.0–100.0)
Platelets: 393 10*3/uL (ref 150–400)
RBC: 4.11 MIL/uL (ref 3.87–5.11)
RDW: 13.1 % (ref 11.5–15.5)
WBC: 13.1 10*3/uL — ABNORMAL HIGH (ref 4.0–10.5)
nRBC: 0 % (ref 0.0–0.2)

## 2019-12-03 LAB — I-STAT BETA HCG BLOOD, ED (MC, WL, AP ONLY): I-stat hCG, quantitative: <5 m[IU]/mL (ref <5)

## 2019-12-03 LAB — LIPASE, BLOOD
Lipase: 19 U/L (ref 11–51)
Lipase: 25 U/L (ref 11–51)

## 2019-12-03 MED ORDER — PROMETHAZINE HCL 25 MG/ML IJ SOLN
12.5000 mg | Freq: Once | INTRAMUSCULAR | Status: AC
  Administered 2019-12-03: 03:00:00 12.5 mg via INTRAVENOUS
  Filled 2019-12-03: qty 1

## 2019-12-03 MED ORDER — ONDANSETRON HCL 4 MG/2ML IJ SOLN
4.0000 mg | Freq: Once | INTRAMUSCULAR | Status: AC
  Administered 2019-12-03: 01:00:00 4 mg via INTRAVENOUS
  Filled 2019-12-03: qty 2

## 2019-12-03 MED ORDER — SODIUM CHLORIDE 0.9 % IV BOLUS
1000.0000 mL | Freq: Once | INTRAVENOUS | Status: AC
  Administered 2019-12-03: 01:00:00 1000 mL via INTRAVENOUS

## 2019-12-03 MED ORDER — DIPHENHYDRAMINE HCL 50 MG/ML IJ SOLN
25.0000 mg | Freq: Once | INTRAMUSCULAR | Status: AC
  Administered 2019-12-03: 22:00:00 25 mg via INTRAVENOUS
  Filled 2019-12-03: qty 1

## 2019-12-03 MED ORDER — SODIUM CHLORIDE 0.9 % IV BOLUS
1000.0000 mL | Freq: Once | INTRAVENOUS | Status: AC
  Administered 2019-12-03: 22:00:00 1000 mL via INTRAVENOUS

## 2019-12-03 MED ORDER — KETOROLAC TROMETHAMINE 30 MG/ML IJ SOLN
30.0000 mg | Freq: Once | INTRAMUSCULAR | Status: AC
  Administered 2019-12-03: 30 mg via INTRAVENOUS
  Filled 2019-12-03: qty 1

## 2019-12-03 MED ORDER — SODIUM CHLORIDE 0.9 % IV BOLUS
1000.0000 mL | Freq: Once | INTRAVENOUS | Status: AC
  Administered 2019-12-03: 1000 mL via INTRAVENOUS

## 2019-12-03 MED ORDER — HALOPERIDOL LACTATE 5 MG/ML IJ SOLN
2.5000 mg | Freq: Once | INTRAMUSCULAR | Status: AC
  Administered 2019-12-03: 22:00:00 2.5 mg via INTRAVENOUS
  Filled 2019-12-03: qty 1

## 2019-12-03 NOTE — ED Notes (Signed)
FIRST NURSE NOTE: Pt walked into front door then sat on the floor, pt had to be helped up by security officers because she was weak, friend with patient stated she is unable to stand and walk because of N/V and was seen in [place] yesterday but they didn't really do anything for her she said.  When patient sat down to the floor she maintained grip of her gatorade bottle, bowl of ice and bag of chips.

## 2019-12-03 NOTE — ED Provider Notes (Signed)
[facility] EMERGENCY DEPARTMENT Provider Note   CSN: [id number] Arrival date & time: 12/02/19  2004     History Chief Complaint  Patient presents with  . Emesis    Brandi [name] is a 24 y.o. female.  Patient is a 24 year old female with history of asthma.  She presents today for evaluation of nausea and vomiting.  This began earlier this evening and has been persistent.  Patient reports her vomit is streaked with blood.  She denies fevers or chills.  She denies diarrhea or constipation.  Patient tells me that she gets this every month with her period.  She has come to the ER on several occasions in the past.  Patient states she made an appointment with a GYN and is due to see them in the near future.  The history is provided by the patient.  Emesis Severity:  Moderate Timing:  Constant Quality:  Stomach contents Progression:  Worsening Chronicity:  New Relieved by:  Nothing Worsened by:  Nothing Ineffective treatments:  None tried Associated symptoms: no abdominal pain        Past Medical History:  Diagnosis Date  . Asthma     Patient Active Problem List   Diagnosis Date Noted  . Syncope 03/02/2019    History reviewed. No pertinent surgical history.   OB History    Gravida  0   Para  0   Term  0   Preterm  0   AB  0   Living  0     SAB  0   TAB  0   Ectopic  0   Multiple  0   Live Births              No family history on file.  Social History   Tobacco Use  . Smoking status: Current Every Day Smoker    Types: Cigarettes  . Smokeless tobacco: Never Used  Substance Use Topics  . Alcohol use: Yes  . Drug use: Yes    Types: Marijuana    Home Medications Prior to Admission medications   Medication Sig Start Date End Date Taking? Authorizing Provider  acetaminophen (TYLENOL) 325 MG tablet Take 650 mg by mouth every 6 (six) hours as needed for mild pain.    [provider]  famotidine (PEPCID) 20 MG tablet  Take 1 tablet (20 mg total) by mouth 2 (two) times daily. Patient not taking: Reported on 03/02/2019 10/23/18   Henderly, Britni A, PA-C  ondansetron (ZOFRAN ODT) 4 MG disintegrating tablet Take 1 tablet (4 mg total) by mouth every 8 (eight) hours as needed for nausea or vomiting. Patient not taking: Reported on 03/02/2019 08/22/18   Henderly, Britni A, PA-C  promethazine (PHENERGAN) 12.5 MG tablet Take 1 tablet (12.5 mg total) by mouth every 6 (six) hours as needed for nausea or vomiting. 11/06/19   Lavonia Drafts, MD  sucralfate (CARAFATE) 1 g tablet Take 1 tablet (1 g total) by mouth 4 (four) times daily -  with meals and at bedtime for 10 days. 10/23/18 11/02/18  Henderly, Britni A, PA-C  Triprolidine-Pseudoephedrine (ANTIHISTAMINE PO) Take 3 tablets by mouth daily as needed (allergies).    [provider]    Allergies    Shellfish allergy  Review of Systems   Review of Systems  Gastrointestinal: Positive for vomiting. Negative for abdominal pain.  All other systems reviewed and are negative.   Physical Exam Updated Vital Signs BP (!) 147/85 (BP Location: Right Arm)  Pulse 82   Temp 99.5 F (37.5 C) (Oral)   Resp 20   Ht 5\' 3"  (1.6 m)   LMP 12/02/2019 (Exact Date)   SpO2 100%   BMI 30.11 kg/m   Physical Exam Vitals and nursing note reviewed.  Constitutional:      General: She is not in acute distress.    Appearance: She is well-developed. She is not diaphoretic.  HENT:     Head: Normocephalic and atraumatic.  Cardiovascular:     Rate and Rhythm: Normal rate and regular rhythm.     Heart sounds: No murmur. No friction rub. No gallop.   Pulmonary:     Effort: Pulmonary effort is normal. No respiratory distress.     Breath sounds: Normal breath sounds. No wheezing.  Abdominal:     General: Bowel sounds are normal. There is no distension.     Palpations: Abdomen is soft.     Tenderness: There is no abdominal tenderness.  Musculoskeletal:        General: Normal range  of motion.     Cervical back: Normal range of motion and neck supple.  Skin:    General: Skin is warm and dry.  Neurological:     Mental Status: She is alert and oriented to person, place, and time.     ED Results / Procedures / Treatments   Labs (all labs ordered are listed, but only abnormal results are displayed) Labs Reviewed  CBC WITH DIFFERENTIAL/PLATELET  COMPREHENSIVE METABOLIC PANEL  LIPASE, BLOOD  I-STAT BETA HCG BLOOD, ED ([facility], [facility], [facility] ONLY)    EKG None  Radiology No results found.  Procedures Procedures (including critical care time)  Medications Ordered in ED Medications  sodium chloride 0.9 % bolus 1,000 mL (has no administration in time range)  ondansetron (ZOFRAN) injection 4 mg (has no administration in time range)  ketorolac (TORADOL) 30 MG/ML injection 30 mg (has no administration in time range)    ED Course  I have reviewed the triage vital signs and the nursing notes.  Pertinent labs & imaging results that were available during my care of the patient were reviewed by me and considered in my medical decision making (see chart for details).    MDM Rules/Calculators/A&P  Patient presenting with nausea and vomiting which is common for her during her menses.  Her abdominal exam is benign and laboratory studies are reassuring.  hCG is negative.  Patient given antiemetics and fluids and is feeling better.  I have not seen her vomit in many hours.  She is tolerating ginger ale.   Patient has inquired about fibroids or possibly endometriosis.  I have advised her to follow-up with a GYN in the near future to discuss.  I do not feel as though any work-up is indicated into these problems this evening.  Final Clinical Impression(s) / ED Diagnoses Final diagnoses:  None    Rx / DC Orders ED Discharge Orders    None       02/01/2020, MD 12/03/19 [phone]

## 2019-12-03 NOTE — ED Provider Notes (Signed)
[facility] Emergency Department Provider Note  ____________________________________________   First MD Initiated Contact with Patient 12/03/19 2112     (approximate)  I have reviewed the triage vital signs and the nursing notes.   HISTORY  Chief Complaint Emesis    HPI Brandi [name] is a 24 y.o. female  With PMHx asthma here with abdominal pain, nausea, vomiting. History somewhat limited as pt is panting, crying on exam. She reports that for the last 2-3 days, she has had progressively worsening and now difficult to control nausea, vomiting. She has associated severe, burning upper abd pain. She was seen at [facility] yesterday and given supportive care, discharged. She states she had persistent vomiting at the time and has been unable to keep anything down since then. Denies fevers. No specific alleviating factors.       Past Medical History:  Diagnosis Date  . Asthma     Patient Active Problem List   Diagnosis Date Noted  . Syncope 03/02/2019    No past surgical history on file.  Prior to Admission medications   Medication Sig Start Date End Date Taking? Authorizing Provider  acetaminophen (TYLENOL) 325 MG tablet Take 650 mg by mouth every 6 (six) hours as needed for mild pain.    [provider]  famotidine (PEPCID) 20 MG tablet Take 1 tablet (20 mg total) by mouth 2 (two) times daily. Patient not taking: Reported on 03/02/2019 10/23/18   Henderly, Britni A, PA-C  ondansetron (ZOFRAN ODT) 4 MG disintegrating tablet Take 1 tablet (4 mg total) by mouth every 8 (eight) hours as needed for nausea or vomiting. Patient not taking: Reported on 03/02/2019 08/22/18   Henderly, Britni A, PA-C  promethazine (PHENERGAN) 12.5 MG tablet Take 1 tablet (12.5 mg total) by mouth every 6 (six) hours as needed for nausea or vomiting. 11/06/19   Lavonia Drafts, MD  sucralfate (CARAFATE) 1 g tablet Take 1 tablet (1 g total) by mouth 4 (four) times daily -  with meals and  at bedtime for 10 days. 10/23/18 11/02/18  Henderly, Britni A, PA-C  Triprolidine-Pseudoephedrine (ANTIHISTAMINE PO) Take 3 tablets by mouth daily as needed (allergies).    [provider]    Allergies Shellfish allergy  No family history on file.  Social History Social History   Tobacco Use  . Smoking status: Current Every Day Smoker    Types: Cigarettes  . Smokeless tobacco: Never Used  Substance Use Topics  . Alcohol use: Yes  . Drug use: Yes    Types: Marijuana    Review of Systems  Review of Systems  Constitutional: Positive for fatigue. Negative for fever.  HENT: Negative for congestion and sore throat.   Eyes: Negative for visual disturbance.  Respiratory: Negative for cough and shortness of breath.   Cardiovascular: Negative for chest pain.  Gastrointestinal: Positive for abdominal pain, nausea and vomiting. Negative for diarrhea.  Genitourinary: Negative for flank pain.  Musculoskeletal: Negative for back pain and neck pain.  Skin: Negative for rash and wound.  Neurological: Positive for weakness.     ____________________________________________  PHYSICAL EXAM:      VITAL SIGNS: ED Triage Vitals  Enc Vitals Group     BP 12/03/19 1902 (!) 164/109     Pulse Rate 12/03/19 1902 (!) 109     Resp 12/03/19 1902 20     Temp 12/03/19 1902 98.3 F (36.8 C)     Temp Source 12/03/19 1902 Oral     SpO2 12/03/19  1902 100 %     Weight 12/03/19 1903 190 lb (86.2 kg)     Height 12/03/19 1903 5\' 2"  (1.575 m)     Head Circumference --      Peak Flow --      Pain Score 12/03/19 2333 Asleep     Pain Loc --      Pain Edu? --      Excl. in [place]? --      Physical Exam Vitals and nursing note reviewed.  Constitutional:      General: She is not in acute distress.    Appearance: She is well-developed.     Comments: Crying in pain, walking around room, dry heaving, occasionally running mouth in water in sink.  HENT:     Head: Normocephalic and atraumatic.  Eyes:       Conjunctiva/sclera: Conjunctivae normal.  Cardiovascular:     Rate and Rhythm: Normal rate and regular rhythm.     Heart sounds: Normal heart sounds. No murmur. No friction rub.  Pulmonary:     Effort: Pulmonary effort is normal. No respiratory distress.     Breath sounds: Normal breath sounds. No wheezing or rales.  Abdominal:     General: There is no distension.     Palpations: Abdomen is soft.     Tenderness: There is abdominal tenderness.  Musculoskeletal:     Cervical back: Neck supple.  Skin:    General: Skin is warm.     Capillary Refill: Capillary refill takes less than 2 seconds.  Neurological:     Mental Status: She is alert and oriented to person, place, and time.     Motor: No abnormal muscle tone.       ____________________________________________   LABS (all labs ordered are listed, but only abnormal results are displayed)  Labs Reviewed  COMPREHENSIVE METABOLIC PANEL - Abnormal; Notable for the following components:      Result Value   Potassium 3.0 (*)    Glucose, Bld 127 (*)    Total Protein 8.4 (*)    All other components within normal limits  CBC - Abnormal; Notable for the following components:   WBC 13.1 (*)    All other components within normal limits  LIPASE, BLOOD  URINALYSIS, COMPLETE (UACMP) WITH MICROSCOPIC  URINE DRUG SCREEN, QUALITATIVE ([facility] ONLY)    ____________________________________________  EKG: None ________________________________________  RADIOLOGY All imaging, including plain films, CT scans, and ultrasounds, independently reviewed by me, and interpretations confirmed via formal radiology reads.  ED MD interpretation:   None  Official radiology report(s): No results found.  ____________________________________________  PROCEDURES   Procedure(s) performed (including Critical Care):  Procedures  ____________________________________________  INITIAL IMPRESSION / MDM / ASSESSMENT AND PLAN / ED COURSE  As  part of my medical decision making, I reviewed the following data within the electronic MEDICAL RECORD NUMBER Nursing notes reviewed and incorporated, Old chart reviewed, Notes from prior ED visits, and  Controlled Substance Database       *Emmah Bratcher was evaluated in Emergency Department on 12/03/2019 for the symptoms described in the history of present illness. She was evaluated in the context of the global COVID-19 pandemic, which necessitated consideration that the patient might be at risk for infection with the SARS-CoV-2 virus that causes COVID-19. Institutional protocols and algorithms that pertain to the evaluation of patients at risk for COVID-19 are in a state of rapid change based on information released by regulatory bodies including the CDC and federal and state organizations.  These policies and algorithms were followed during the patient's care in the ED.  Some ED evaluations and interventions may be delayed as a result of limited staffing during the pandemic.*     Medical Decision Making:  24 yo F here with n/v and abd pain. Pt has mild likely reactive leukocytosis. CMP is unremarkable, with exception of mild hypoK likely from her emesis. Reviewed labs, vitals from recent ED visit and prior ED visits. 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 Unknown if she uses THC. Pt given hadol with excellent effect and is now resting comfortably. Otherwise, she is HDS. No focal TTP of her abdomen to suggest cholecystitis, appendicitis, or surgical abnormality and h/o similar episodes makes this less likely. Will plan to monitor in ED, PO challenge, and reassess.  ____________________________________________  FINAL CLINICAL IMPRESSION(S) / ED DIAGNOSES  Final diagnoses:  Dehydration  Non-intractable vomiting with nausea, unspecified vomiting type     MEDICATIONS GIVEN DURING THIS VISIT:  Medications  haloperidol lactate (HALDOL) injection 2.5 mg (2.5 mg Intravenous Given 12/03/19 2144)   diphenhydrAMINE (BENADRYL) injection 25 mg (25 mg Intravenous Given 12/03/19 2144)  sodium chloride 0.9 % bolus 1,000 mL (1,000 mLs Intravenous New Bag/Given 12/03/19 2143)  sodium chloride 0.9 % bolus 1,000 mL (1,000 mLs Intravenous New Bag/Given 12/03/19 2228)     ED Discharge Orders    None       Note:  This document was prepared using Dragon voice recognition software and may include unintentional dictation errors.   Shaune Pollack, MD 12/03/19 2350

## 2019-12-03 NOTE — Discharge Instructions (Addendum)
Follow-up with your GYN as scheduled.  Continue taking Phenergan as previously prescribed.  Return to the ER if your symptoms significantly worsen or change.

## 2019-12-03 NOTE — ED Notes (Signed)
Upon assessment, pt is sleeping. NAD noted.

## 2019-12-03 NOTE — ED Triage Notes (Signed)
Patient reports she had been vomiting for 2 days.   Reports seen at [facility] yesterday for same but not feeling any better.

## 2019-12-03 NOTE — ED Notes (Signed)
Pts girlfriend called and asked this RN if the ED couple prescribe patient birth control. Girlfriend informed that pt would need to follow up with OBGYN for birth control. Pt also updated. Pt resting in bed upon assessment. Warm blanket provided and pt updated on delay in care. Lights dimmed and call bell in reach.

## 2019-12-03 NOTE — ED Notes (Signed)
Pt was discharged from the ED. Pt read and understood discharge paperwork. Pt had vital signs completed. Pt conscious, breathing, and A&Ox4. No distress noted. Pt speaking in complete sentences. Pt ambulated out of the ED with a smooth and steady gait. E-signature not available.  

## 2019-12-03 NOTE — ED Notes (Signed)
RN entered room and pt had head in sink running water on tongue. Pt reports she is not drinking it but it is helping with the nausea. Pt has an emesis bag at bedside filled with a moderate amount of emesis.

## 2019-12-04 LAB — URINE DRUG SCREEN, QUALITATIVE (ARMC ONLY)
Amphetamines, Ur Screen: NOT DETECTED
Barbiturates, Ur Screen: NOT DETECTED
Benzodiazepine, Ur Scrn: NOT DETECTED
Cannabinoid 50 Ng, Ur ~~LOC~~: POSITIVE — AB
Cocaine Metabolite,Ur ~~LOC~~: NOT DETECTED
MDMA (Ecstasy)Ur Screen: NOT DETECTED
Methadone Scn, Ur: NOT DETECTED
Opiate, Ur Screen: NOT DETECTED
Phencyclidine (PCP) Ur S: NOT DETECTED
Tricyclic, Ur Screen: NOT DETECTED

## 2019-12-04 LAB — URINALYSIS, COMPLETE (UACMP) WITH MICROSCOPIC
Bacteria, UA: NONE SEEN
Bilirubin Urine: NEGATIVE
Glucose, UA: NEGATIVE mg/dL
Ketones, ur: 80 mg/dL — AB
Leukocytes,Ua: NEGATIVE
Nitrite: NEGATIVE
Protein, ur: 100 mg/dL — AB
RBC / HPF: >50 RBC/hpf — ABNORMAL HIGH (ref 0–5)
Specific Gravity, Urine: 1.024 (ref 1.005–1.030)
pH: 8 (ref 5.0–8.0)

## 2019-12-04 MED ORDER — DICYCLOMINE HCL 10 MG/ML IM SOLN
20.0000 mg | Freq: Once | INTRAMUSCULAR | Status: AC
  Administered 2019-12-04: 02:00:00 20 mg via INTRAMUSCULAR
  Filled 2019-12-04: qty 2

## 2019-12-04 MED ORDER — FAMOTIDINE IN NACL 20-0.9 MG/50ML-% IV SOLN
20.0000 mg | Freq: Once | INTRAVENOUS | Status: AC
  Administered 2019-12-04: 02:00:00 20 mg via INTRAVENOUS
  Filled 2019-12-04: qty 50

## 2019-12-04 MED ORDER — ONDANSETRON HCL 4 MG/2ML IJ SOLN
INTRAMUSCULAR | Status: AC
  Administered 2019-12-04: 01:00:00 4 mg via INTRAVENOUS
  Filled 2019-12-04: qty 2

## 2019-12-04 MED ORDER — DICYCLOMINE HCL 10 MG PO CAPS: 10.0000 mg | ORAL_CAPSULE | Freq: Four times a day (QID) | ORAL | 0 refills | Status: DC | PRN

## 2019-12-04 MED ORDER — ONDANSETRON HCL 4 MG PO TABS: 4.0000 mg | ORAL_TABLET | Freq: Four times a day (QID) | ORAL | 1 refills | Status: DC | PRN

## 2019-12-04 MED ORDER — FAMOTIDINE 20 MG PO TABS: 20.0000 mg | ORAL_TABLET | Freq: Two times a day (BID) | ORAL | 0 refills | Status: DC

## 2019-12-04 MED ORDER — DEXTROSE-NACL 5-0.45 % IV SOLN: Freq: Once | INTRAVENOUS | Status: AC

## 2019-12-04 MED ORDER — ONDANSETRON HCL 4 MG/2ML IJ SOLN: 4.0000 mg | Freq: Once | INTRAMUSCULAR | Status: AC

## 2019-12-04 NOTE — Discharge Instructions (Signed)
Keep your appointment with gynecology since these recurrent symptoms seem to be related to your periods.

## 2019-12-04 NOTE — ED Notes (Addendum)
Pt reporting nausea has returned. Pt groaning and unable to lay still. MD made aware.

## 2019-12-04 NOTE — ED Notes (Signed)
Pt reporting severe pain with IM injection. Pt resting in bed but awake at this time.

## 2019-12-04 NOTE — ED Provider Notes (Signed)
Procedures     ----------------------------------------- 4:23 AM on 12/04/2019 -----------------------------------------  Patient was still feeling nauseated and unable to eat after initial Haldol and Benadryl and fluids.  She was reassessed at 2:00 AM, abdominal exam was unremarkable.  She did report that the symptoms are recurrent and seem to be coming every month with her periods.  She has an appointment with gynecology tomorrow, no known diagnosis of endometriosis, has not seen gynecology before for this.  Patient given Zofran, Bentyl, Pepcid around 2:00, is now feeling much better.  Tolerating oral intake with crackers and fluids and stable for discharge home.    Sharman Cheek, MD 12/04/19 [phone]

## 2019-12-04 NOTE — ED Notes (Signed)
Continued nausea after reassessment. MD made aware.

## 2019-12-04 NOTE — ED Notes (Signed)
Patient is eating crackers and drinking

## 2019-12-04 NOTE — ED Notes (Signed)
Pt able to urinate and is attempting PO challenge at this time. Pt reporting her nausea has subsided.

## 2019-12-05 ENCOUNTER — Other Ambulatory Visit: Payer: Self-pay

## 2019-12-05 ENCOUNTER — Emergency Department (HOSPITAL_COMMUNITY)
Admission: EM | Admit: 2019-12-05 | Discharge: 2019-12-05 | Disposition: A | Discharge status: Home / Self Care | Payer: BLUE CROSS/BLUE SHIELD | Attending: Emergency Medicine | Admitting: Emergency Medicine

## 2019-12-05 DIAGNOSIS — R1010 Upper abdominal pain, unspecified: Secondary | ICD-10-CM | POA: Diagnosis not present

## 2019-12-05 DIAGNOSIS — R197 Diarrhea, unspecified: Secondary | ICD-10-CM | POA: Diagnosis not present

## 2019-12-05 DIAGNOSIS — R112 Nausea with vomiting, unspecified: Secondary | ICD-10-CM | POA: Diagnosis not present

## 2019-12-05 DIAGNOSIS — J45909 Unspecified asthma, uncomplicated: Secondary | ICD-10-CM | POA: Diagnosis not present

## 2019-12-05 DIAGNOSIS — R630 Anorexia: Secondary | ICD-10-CM | POA: Insufficient documentation

## 2019-12-05 DIAGNOSIS — F17210 Nicotine dependence, cigarettes, uncomplicated: Secondary | ICD-10-CM | POA: Diagnosis not present

## 2019-12-05 LAB — COMPREHENSIVE METABOLIC PANEL
ALT: 16 U/L (ref 0–44)
AST: 18 U/L (ref 15–41)
Albumin: 4 g/dL (ref 3.5–5.0)
Alkaline Phosphatase: 62 U/L (ref 38–126)
Anion gap: 13 (ref 5–15)
BUN: 8 mg/dL (ref 6–20)
CO2: 23 mmol/L (ref 22–32)
Calcium: 9.2 mg/dL (ref 8.9–10.3)
Chloride: 100 mmol/L (ref 98–111)
Creatinine, Ser: 0.85 mg/dL (ref 0.44–1.00)
GFR calc Af Amer: >60 mL/min (ref >60)
GFR calc non Af Amer: >60 mL/min (ref >60)
Glucose, Bld: 142 mg/dL — ABNORMAL HIGH (ref 70–99)
Potassium: 3.5 mmol/L (ref 3.5–5.1)
Sodium: 136 mmol/L (ref 135–145)
Total Bilirubin: 0.8 mg/dL (ref 0.3–1.2)
Total Protein: 7.7 g/dL (ref 6.5–8.1)

## 2019-12-05 LAB — URINALYSIS, ROUTINE W REFLEX MICROSCOPIC
Bacteria, UA: NONE SEEN
Bilirubin Urine: NEGATIVE
Glucose, UA: 50 mg/dL — AB
Ketones, ur: 20 mg/dL — AB
Leukocytes,Ua: NEGATIVE
Nitrite: NEGATIVE
Protein, ur: 100 mg/dL — AB
RBC / HPF: >50 RBC/hpf — ABNORMAL HIGH (ref 0–5)
Specific Gravity, Urine: 1.02 (ref 1.005–1.030)
pH: 7 (ref 5.0–8.0)

## 2019-12-05 LAB — CBC
HCT: 38.8 % (ref 36.0–46.0)
Hemoglobin: 12.7 g/dL (ref 12.0–15.0)
MCH: 30.1 pg (ref 26.0–34.0)
MCHC: 32.7 g/dL (ref 30.0–36.0)
MCV: 91.9 fL (ref 80.0–100.0)
Platelets: 391 10*3/uL (ref 150–400)
RBC: 4.22 MIL/uL (ref 3.87–5.11)
RDW: 12.9 % (ref 11.5–15.5)
WBC: 14.5 10*3/uL — ABNORMAL HIGH (ref 4.0–10.5)
nRBC: 0 % (ref 0.0–0.2)

## 2019-12-05 LAB — LIPASE, BLOOD: Lipase: 22 U/L (ref 11–51)

## 2019-12-05 LAB — I-STAT BETA HCG BLOOD, ED (MC, WL, AP ONLY): I-stat hCG, quantitative: <5 m[IU]/mL (ref <5)

## 2019-12-05 MED ORDER — LACTATED RINGERS IV BOLUS
1000.0000 mL | Freq: Once | INTRAVENOUS | Status: AC
  Administered 2019-12-05: 1000 mL via INTRAVENOUS

## 2019-12-05 MED ORDER — DROPERIDOL 2.5 MG/ML IJ SOLN
2.5000 mg | Freq: Once | INTRAMUSCULAR | Status: AC
  Administered 2019-12-05: 17:00:00 2.5 mg via INTRAVENOUS
  Filled 2019-12-05: qty 2

## 2019-12-05 MED ORDER — SODIUM CHLORIDE 0.9% FLUSH
3.0000 mL | Freq: Once | INTRAVENOUS | Status: AC
  Administered 2019-12-05: 3 mL via INTRAVENOUS

## 2019-12-05 MED ORDER — SUCRALFATE 1 GM/10ML PO SUSP
1.0000 g | Freq: Once | ORAL | Status: DC
  Filled 2019-12-05: qty 10

## 2019-12-05 MED ORDER — FAMOTIDINE IN NACL 20-0.9 MG/50ML-% IV SOLN
20.0000 mg | Freq: Once | INTRAVENOUS | Status: AC
  Administered 2019-12-05: 20 mg via INTRAVENOUS
  Filled 2019-12-05: qty 50

## 2019-12-05 MED ORDER — METOCLOPRAMIDE HCL 5 MG/ML IJ SOLN
10.0000 mg | Freq: Once | INTRAMUSCULAR | Status: AC
  Administered 2019-12-05: 10 mg via INTRAVENOUS
  Filled 2019-12-05: qty 2

## 2019-12-05 NOTE — ED Provider Notes (Signed)
[name] Provider Note   CSN: [id number] Arrival date & time: 12/05/19  3500     History Chief Complaint  Patient presents with  . Abdominal Pain  . Emesis    [name] is a 24 y.o. female.  HPI    Patient presents 1 day after being seen in our affiliated facility with concern of nausea, vomiting, upper abdominal pain, now with concern for the same.  She notes that since she was about 24 years old she has had episodes similar to that she is currently experiencing.  She had early menarche, and notes that her symptoms typically occur in the setting of ongoing menses. She is currently menstruating.  She was seen yesterday due to burning pain across the upper abdomen.  She received medication, had transient improvement was discharged with plan to follow-up today with gynecology.  She notes that she had recurrence of her symptoms was unable to go to gynecology, and presents for evaluation.  Does not seem as though she has had an evaluation with the gynecologist in regards to possible entities such as endometriosis.  She does note that she has a family history of this. She notes that she does smoke marijuana, but has not done so in several days.  Since yesterday she has been unable to tolerate anything for relief, notes that her anorexia, nausea are profound, as well as the pain is severe, and now presents for evaluation. Past Medical History:  Diagnosis Date  . Asthma     Patient Active Problem List   Diagnosis Date Noted  . Syncope 03/02/2019    No past surgical history on file.   OB History    Gravida  0   Para  0   Term  0   Preterm  0   AB  0   Living  0     SAB  0   TAB  0   Ectopic  0   Multiple  0   Live Births              No family history on file.  Social History   Tobacco Use  . Smoking status: Current Every Day Smoker    Types: Cigarettes  . Smokeless tobacco: Never Used  Substance Use Topics  .  Alcohol use: Yes  . Drug use: Yes    Types: Marijuana    Home Medications Prior to Admission medications   Medication Sig Start Date End Date Taking? Authorizing Provider  acetaminophen (TYLENOL) 325 MG tablet Take 650 mg by mouth every 6 (six) hours as needed for mild pain.    [provider]  dicyclomine (BENTYL) 10 MG capsule Take 1 capsule (10 mg total) by mouth every 6 (six) hours as needed for up to 7 days for spasms (abdominal pain). 12/04/19 12/11/19  Carrie Mew, MD  famotidine (PEPCID) 20 MG tablet Take 1 tablet (20 mg total) by mouth 2 (two) times daily. 12/04/19   Carrie Mew, MD  ondansetron (ZOFRAN ODT) 4 MG disintegrating tablet Take 1 tablet (4 mg total) by mouth every 8 (eight) hours as needed for nausea or vomiting. Patient not taking: Reported on 03/02/2019 08/22/18   Henderly, Britni A, PA-C  ondansetron (ZOFRAN) 4 MG tablet Take 1 tablet (4 mg total) by mouth every 6 (six) hours as needed for nausea or vomiting. 12/04/19   Carrie Mew, MD  promethazine (PHENERGAN) 12.5 MG tablet Take 1 tablet (12.5 mg total) by mouth every 6 (  six) hours as needed for nausea or vomiting. 11/06/19   Jene Every, MD  sucralfate (CARAFATE) 1 g tablet Take 1 tablet (1 g total) by mouth 4 (four) times daily -  with meals and at bedtime for 10 days. 10/23/18 11/02/18  Henderly, Britni A, PA-C  Triprolidine-Pseudoephedrine (ANTIHISTAMINE PO) Take 3 tablets by mouth daily as needed (allergies).    [provider]    Allergies    Shellfish allergy  Review of Systems   Review of Systems  Constitutional:       Per HPI, otherwise negative  HENT:       Per HPI, otherwise negative  Respiratory:       Per HPI, otherwise negative  Cardiovascular:       Per HPI, otherwise negative  Gastrointestinal: Positive for abdominal pain, nausea and vomiting.  Endocrine:       Negative aside from HPI  Genitourinary:       Neg aside from HPI   Musculoskeletal:       Per HPI,  otherwise negative  Skin: Negative.   Neurological: Negative for syncope.    Physical Exam Updated Vital Signs BP (!) 176/99 (BP Location: Left Arm)   Pulse 91   Temp 99.6 F (37.6 C) (Oral)   Resp 18   LMP 12/02/2019 (Exact Date)   SpO2 100%   Physical Exam Vitals and nursing note reviewed.  Constitutional:      General: She is not in acute distress.    Appearance: She is well-developed.     Comments: Young female resting in left lateral decubitus position, not actively vomiting, though describing pain throughout her abdomen, worse with activity.  HENT:     Head: Normocephalic and atraumatic.  Eyes:     Conjunctiva/sclera: Conjunctivae normal.  Cardiovascular:     Rate and Rhythm: Normal rate and regular rhythm.  Pulmonary:     Effort: Pulmonary effort is normal. No respiratory distress.     Breath sounds: No stridor.  Abdominal:     General: There is no distension.     Comments: The patient describes some discomfort in the upper abdomen, there is no guarding, no rebound, no evidence for peritonitis.  Skin:    General: Skin is warm and dry.  Neurological:     Mental Status: She is alert and oriented to person, place, and time.     Cranial Nerves: No cranial nerve deficit.     ED Results / Procedures / Treatments   Labs (all labs ordered are listed, but only abnormal results are displayed) Labs Reviewed  COMPREHENSIVE METABOLIC PANEL - Abnormal; Notable for the following components:      Result Value   Glucose, Bld 142 (*)    All other components within normal limits  CBC - Abnormal; Notable for the following components:   WBC 14.5 (*)    All other components within normal limits  URINALYSIS, ROUTINE W REFLEX MICROSCOPIC - Abnormal; Notable for the following components:   APPearance HAZY (*)    Glucose, UA 50 (*)    Hgb urine dipstick LARGE (*)    Ketones, ur 20 (*)    Protein, ur 100 (*)    RBC / HPF >50 (*)    All other components within normal limits    LIPASE, BLOOD  I-STAT BETA HCG BLOOD, ED ([facility], [facility], [facility] ONLY)    EKG None  Radiology No results found.  Procedures Procedures (including critical care time)  Medications Ordered in ED Medications  sucralfate (CARAFATE) 1 GM/10ML suspension 1 g (0 g Oral Hold 12/05/19 1700)  sodium chloride flush (NS) 0.9 % injection 3 mL (3 mLs Intravenous Given 12/05/19 1714)  lactated ringers bolus 1,000 mL (1,000 mLs Intravenous New Bag/Given 12/05/19 1714)  famotidine (PEPCID) IVPB 20 mg premix (0 mg Intravenous Stopped 12/05/19 1745)  droperidol (INAPSINE) 2.5 MG/ML injection 2.5 mg (2.5 mg Intravenous Given 12/05/19 1713)  lactated ringers bolus 1,000 mL (0 mLs Intravenous Stopped 12/05/19 1851)  metoCLOPramide (REGLAN) injection 10 mg (10 mg Intravenous Given 12/05/19 1749)    ED Course  I have reviewed the triage vital signs and the nursing notes.  Pertinent labs & imaging results that were available during my care of the patient were reviewed by me and considered in my medical decision making (see chart for details).   After the initial evaluation I reviewed the patient's chart including documentation from yesterday's visit, and one from last month, both with similar concerns.  Patient's labs from earlier notable for ketonuria, hyperglycemia.  After receiving IV meds, fluids she was discharged after reportedly not having vomited for quite some time.  Update initial labs generally reassuring, though she has hyperglycemia and worsening leukocytosis.  With concern for her ketone urea from yesterday, ongoing vomiting, patient is receiving fluids, additional meds.  7:23 PM Patient has received multiple fluid boluses, repaired all, Reglan.  Labs reviewed, consistent with prior studies, though with diminished ketones in the urine suggesting improved hydration status.  Patient has received ice chips, defers other oral medication, states that she is ready for discharge. She and I discussed again the importance of  following up as an outpatient, specifically with gynecology first for consideration of endometriosis, and also with gastroenterology.  Without other abdominal pain, with a history of similar events going back to age 30, low suspicion for acute new pathology including peritonitis, however given this duration of illness, the patient is in need of ongoing outpatient therapy but is currently stable, appropriate for discharge.  Final Clinical Impression(s) / ED Diagnoses Final diagnoses:  Nausea vomiting and diarrhea     Gerhard Munch, MD 12/05/19 1925

## 2019-12-05 NOTE — Discharge Instructions (Addendum)
As discussed, it is very important you obtain and use your previously prescribed medication.  It is also important that you follow-up with your gynecologist, or if you are unable to do so, please use this information provided above to follow-up with a local 1.  Monitor your condition carefully, and do not hesitate to return if you develop new, or concerning changes in your condition.

## 2019-12-05 NOTE — ED Triage Notes (Signed)
Pt here for ongoing central abdominal pain and emesis x 4 days. Arrives with rx paper she has not filled from previous two visits in the last two days for same-sts she has been unable to go to the pharmacy to fill it.

## 2019-12-05 NOTE — ED Notes (Signed)
[name] would like an update [phone]

## 2020-01-24 ENCOUNTER — Ambulatory Visit (INDEPENDENT_AMBULATORY_CARE_PROVIDER_SITE_OTHER): Payer: BLUE CROSS/BLUE SHIELD | Admitting: Obstetrics & Gynecology

## 2020-01-24 ENCOUNTER — Other Ambulatory Visit: Payer: Self-pay

## 2020-01-24 ENCOUNTER — Encounter: Payer: Self-pay | Admitting: Obstetrics & Gynecology

## 2020-01-24 VITALS — BP 140/88 | HR 71 | Wt 216.0 lb

## 2020-01-24 DIAGNOSIS — R030 Elevated blood-pressure reading, without diagnosis of hypertension: Secondary | ICD-10-CM | POA: Diagnosis not present

## 2020-01-24 DIAGNOSIS — N809 Endometriosis, unspecified: Secondary | ICD-10-CM | POA: Diagnosis not present

## 2020-01-24 DIAGNOSIS — R1084 Generalized abdominal pain: Secondary | ICD-10-CM

## 2020-01-24 NOTE — Patient Instructions (Signed)
Endometriosis  Endometriosis is a condition in which the tissue that lines the uterus (endometrium) grows outside of its normal location. The tissue may grow in many locations close to the uterus, but it commonly grows on the ovaries, fallopian tubes, vagina, or bowel. When the uterus sheds the endometrium every menstrual cycle, there is bleeding wherever the endometrial tissue is located. This can cause pain because blood is irritating to tissues that are not normally exposed to it. What are the causes? The cause of endometriosis is not known. What increases the risk? You may be more likely to develop endometriosis if you:  Have a family history of endometriosis.  Have never given birth.  Started your period at age 10 or younger.  Have high levels of estrogen in your body.  Were exposed to a certain medicine (diethylstilbestrol) before you were born (in utero).  Had low birth weight.  Were born as a twin, triplet, or other multiple.  Have a BMI of less than 25. BMI is an estimate of body fat and is calculated from height and weight. What are the signs or symptoms? Often, there are no symptoms of this condition. If you do have symptoms, they may:  Vary depending on where your endometrial tissue is growing.  Occur during your menstrual period (most common) or midcycle.  Come and go, or you may go months with no symptoms at all.  Stop with menopause. Symptoms may include:  Pain in the back or abdomen.  Heavier bleeding during periods.  Pain during sex.  Painful bowel movements.  Infertility.  Pelvic pain.  Bleeding more than once a month. How is this diagnosed? This condition is diagnosed based on your symptoms and a physical exam. You may have tests, such as:  Blood tests and urine tests. These may be done to help rule out other possible causes of your symptoms.  Ultrasound, to look for abnormal tissues.  An X-ray of the lower bowel (barium enema).  An  ultrasound that is done through the vagina (transvaginally).  CT scan.  MRI.  Laparoscopy. In this procedure, a lighted, pencil-sized instrument called a laparoscope is inserted into your abdomen through an incision. The laparoscope allows your health care provider to look at the organs inside your body and check for abnormal tissue to confirm the diagnosis. If abnormal tissue is found, your health care provider may remove a small piece of tissue (biopsy) to be examined under a microscope. How is this treated? Treatment for this condition may include:  Medicines to relieve pain, such as NSAIDs.  Hormone therapy. This involves using artificial (synthetic) hormones to reduce endometrial tissue growth. Your health care provider may recommend using a hormonal form of birth control, or other medicines.  Surgery. This may be done to remove abnormal endometrial tissue. ? In some cases, tissue may be removed using a laparoscope and a laser (laparoscopic laser treatment). ? In severe cases, surgery may be done to remove the fallopian tubes, uterus, and ovaries (hysterectomy). Follow these instructions at home:  Take over-the-counter and prescription medicines only as told by your health care provider.  Do not drive or use heavy machinery while taking prescription pain medicine.  Try to avoid activities that cause pain, including sexual activity.  Keep all follow-up visits as told by your health care provider. This is important. Contact a health care provider if:  You have pain in the area between your hip bones (pelvic area) that occurs: ? Before, during, or after your period. ?   In between your period and gets worse during your period. ? During or after sex. ? With bowel movements or urination, especially during your period.  You have problems getting pregnant.  You have a fever. Get help right away if:  You have severe pain that does not get better with medicine.  You have severe  nausea and vomiting, or you cannot eat without vomiting.  You have pain that affects only the lower, right side of your abdomen.  You have abdominal pain that gets worse.  You have abdominal swelling.  You have blood in your stool. This information is not intended to replace advice given to you by your health care provider. Make sure you discuss any questions you have with your health care provider. Document Revised: 07/30/2017 Document Reviewed: 01/18/2016 Elsevier Patient Education  2020 [employer]  

## 2020-01-24 NOTE — Progress Notes (Signed)
Subjective:     [name] is a 24 y.o. female who presents for evaluation of abdominal pain. The pain is described as aching, cramping, dull, pressure-like and shooting, and is 6/10 in intensity. Pain is located in the RLQ, LLQ and deep pelvis area with radiation to the back, right groin and left groin. Onset was gradual occurring 3 years ago. Symptoms have been gradually worsening since. Aggravating factors: activity, bowel movement, eating, movement, pressure and sitting up. Alleviating factors: none. Associated symptoms: nausea, sweats and vomiting. The patient denies belching, chills, diarrhea and frequency. Risk factors for pelvic/abdominal pain include concern for endometriosis, family h/o fibroids and endometriosis .  Menstrual History: OB History    Gravida  0   Para  0   Term  0   Preterm  0   AB  0   Living  0     SAB  0   TAB  0   Ectopic  0   Multiple  0   Live Births              Menarche age: 87 01/11/20    The following portions of the patient's history were reviewed and updated as appropriate: allergies, current medications, past family history, past medical history, past social history, past surgical history and problem list.   Review of Systems Pertinent items are noted in HPI.    Objective:    BP 140/88   Pulse 71   Wt 98 kg   BMI 39.51 kg/m  General:   alert and cooperative  Lungs:   clear to auscultation bilaterally  Heart:   regular rate and rhythm, S1, S2 normal, no murmur, click, rub or gallop  Abdomen:  soft, non-tender; bowel sounds normal; no masses,  no organomegaly  CVA:   absent  Pelvis:  Exam deferred.  Extremities:   extremities normal, atraumatic, no cyanosis or edema  Neurologic:   negative and Alert and oriented x3. Gait normal. Reflexes and motor strength normal and symmetric. Cranial nerves 2-12 and sensation grossly intact.  Psychiatric:   normal mood, behavior, speech, dress, and thought processes     Imaging Ultrasound - Pelvic Vaginal    Assessment:    Functional: dysmenorrhea and endometriosis  Pt currently on POP trial, pt is also to be evaluated for cHTN, and possible change to OCPs at that time.    Plan:    The diagnosis was discussed with the patient and evaluation and treatment plans outlined. Further follow-up plans will be based on outcome of lab/imaging studies; see orders. Follow up in 2 months or as needed.  If OCP trail unsuccessful, then [place] and/or mirena IUD will be next options.

## 2020-04-03 ENCOUNTER — Ambulatory Visit (INDEPENDENT_AMBULATORY_CARE_PROVIDER_SITE_OTHER): Payer: BLUE CROSS/BLUE SHIELD | Admitting: Primary Care

## 2020-04-03 IMAGING — CT CT ABD-PELV W/ CM
2 of 4 series · 17 of 46 positions shown, 19 images · IV contrast (APPLIED)
Comparison: 08/22/2018

CLINICAL DATA: Nausea vomiting for 3 days.

EXAM:
CT ABDOMEN AND PELVIS WITH CONTRAST
TECHNIQUE: Multidetector CT imaging of the abdomen and pelvis was performed
using the standard protocol following bolus administration of
intravenous contrast.
CONTRAST:  75mL OMNIPAQUE IOHEXOL 300 MG/ML  SOLN

[Series 3: abd/ pelvis 5.0 i30f 2 · axial · 0.76mm/px · z∈[+955,+1330]mm · 14 of 83 slices shown, 16 images]
[im 4/83  soft-tissue]
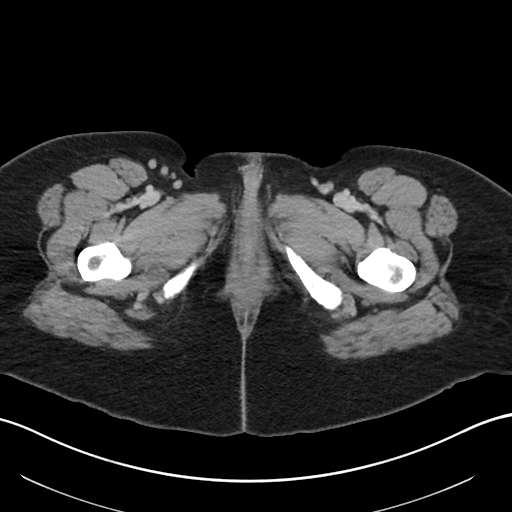
[im 4/83  bone]
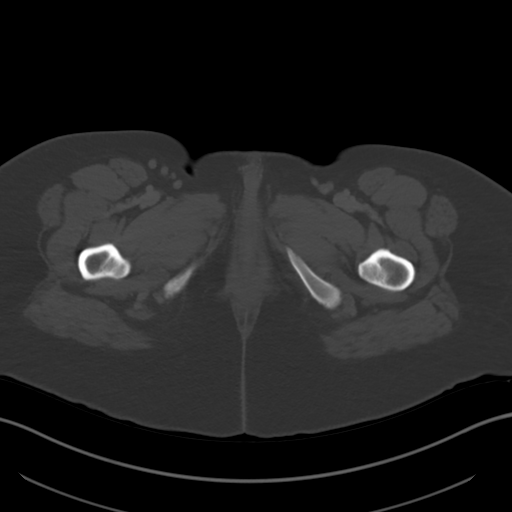
[im 10/83  soft-tissue]
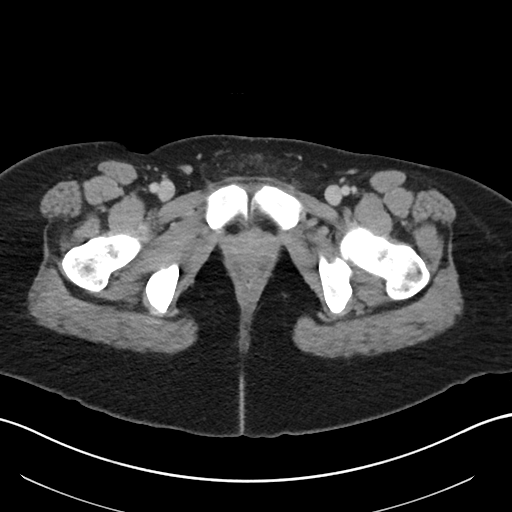
[im 17/83  soft-tissue]
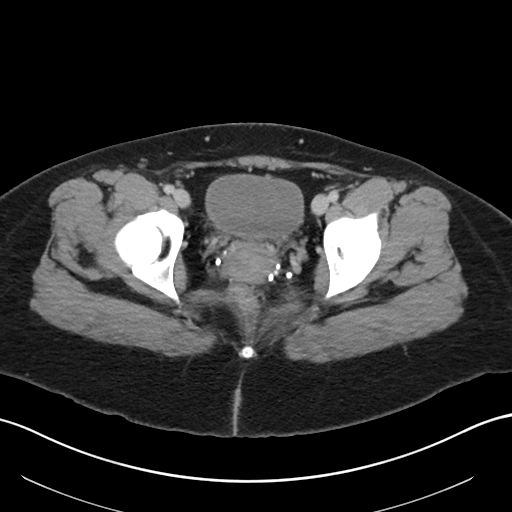
[im 23/83  soft-tissue]
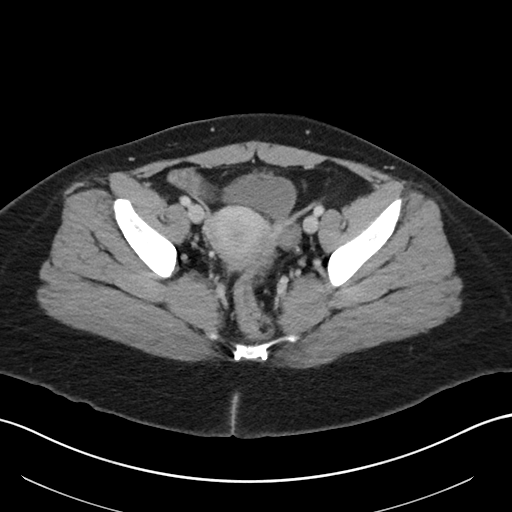
[im 27/83  soft-tissue]
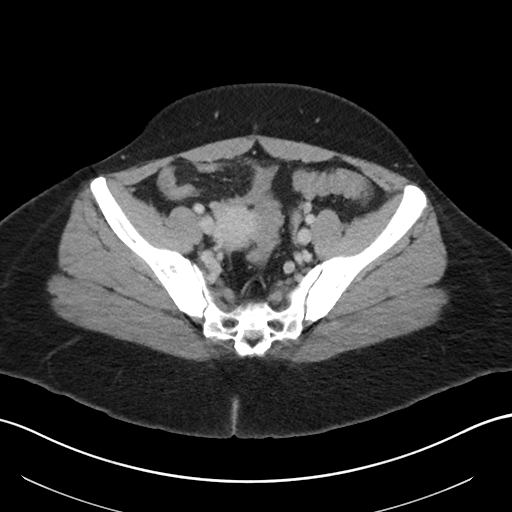
[im 33/83  soft-tissue]
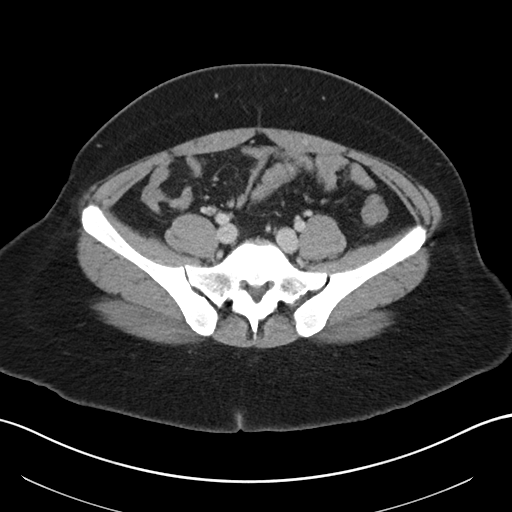
[im 40/83  soft-tissue]
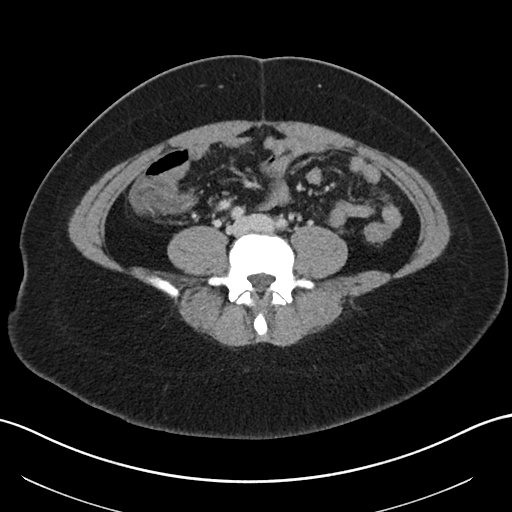
[im 43/83  soft-tissue]
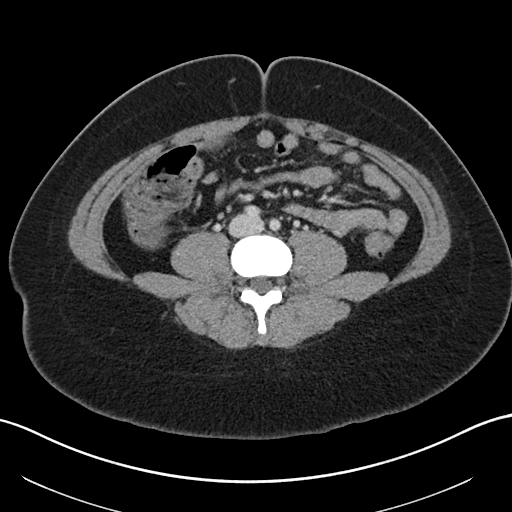
[im 50/83  soft-tissue]
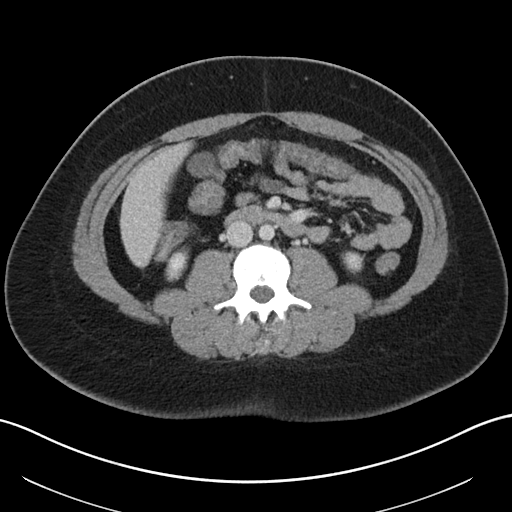
[im 50/83  bone]
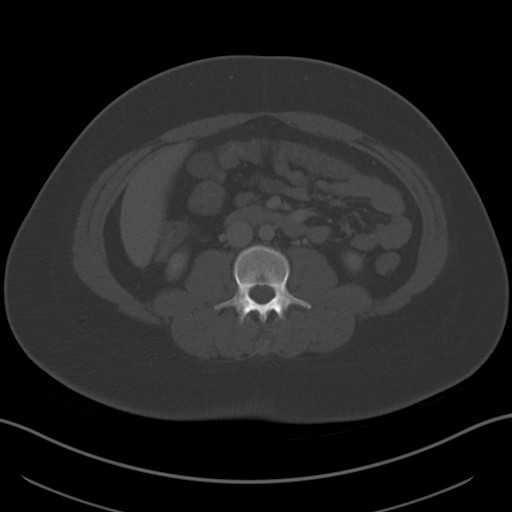
[im 56/83  soft-tissue]
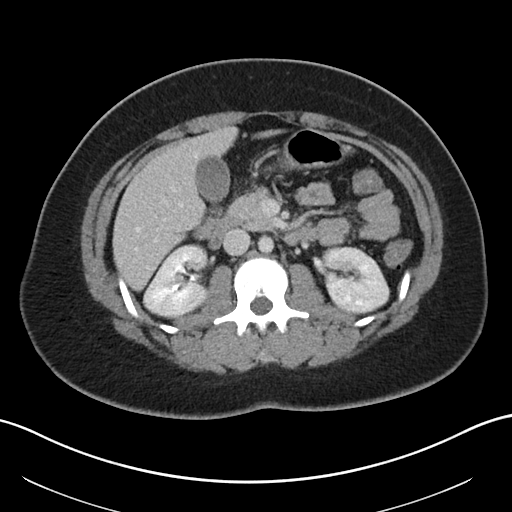
[im 63/83  soft-tissue]
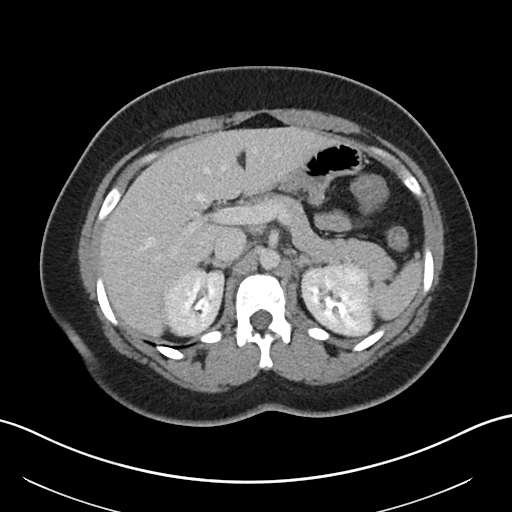
[im 66/83  soft-tissue]
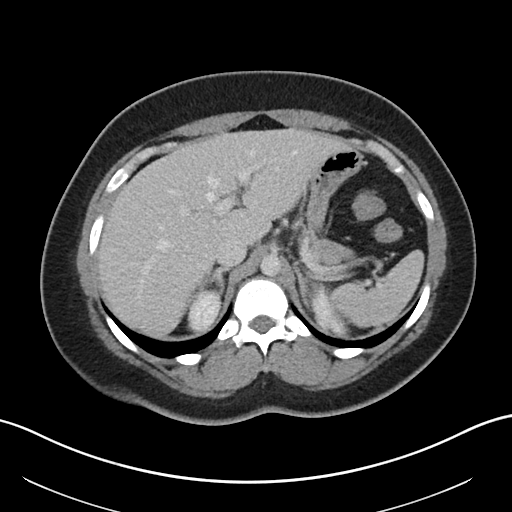
[im 73/83  soft-tissue]
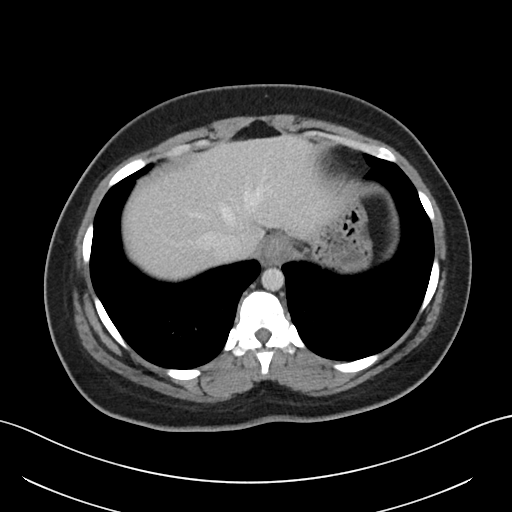
[im 79/83  soft-tissue]
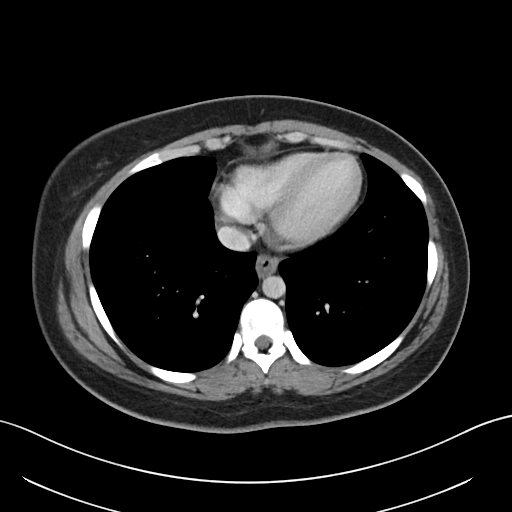

[Series 6: coronal soft tissue · coronal · 0.81mm/px · 3 of 101 slices shown]
[im 34/101  soft-tissue]
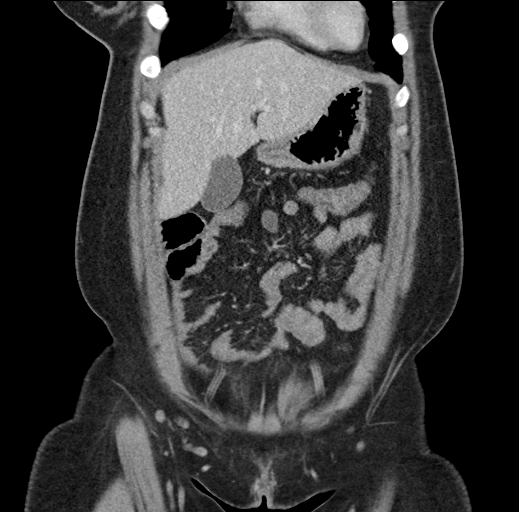
[im 45/101  soft-tissue]
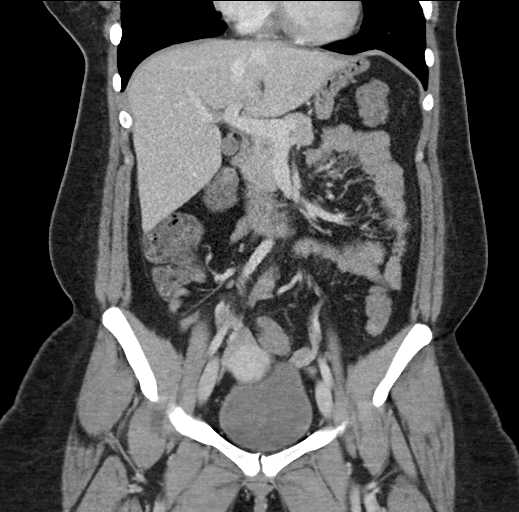
[im 56/101  soft-tissue]
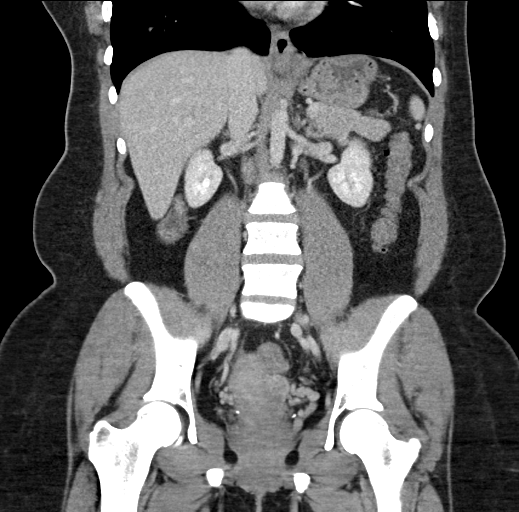

[17 of 46 positions shown; findings below may reference images not displayed]

FINDINGS: Lower chest: No acute abnormality. Small hiatal hernia.

Hepatobiliary: No focal liver abnormality is seen. No gallstones,
gallbladder wall thickening, or biliary dilatation.

Pancreas: Unremarkable. No pancreatic ductal dilatation or
surrounding inflammatory changes.

Spleen: Normal in size without focal abnormality.

Adrenals/Urinary Tract: Adrenal glands are unremarkable. Kidneys are
normal, without renal calculi, focal lesion, or hydronephrosis.
Bladder is unremarkable.

Stomach/Bowel: Stomach is within normal limits. Appendix appears
normal. No evidence of bowel wall thickening, distention, or
inflammatory changes.

Vascular/Lymphatic: No significant vascular findings are present. No
enlarged abdominal or pelvic lymph nodes.

Reproductive: Uterus and bilateral adnexa are unremarkable.

Other: No abdominal wall hernia or abnormality. No abdominopelvic
ascites.

Musculoskeletal: No acute or significant osseous findings.
IMPRESSION: No evidence of acute abnormalities within the abdomen or pelvis.

Small hiatal hernia.

## 2020-05-09 ENCOUNTER — Ambulatory Visit: Payer: BLUE CROSS/BLUE SHIELD | Admitting: Obstetrics & Gynecology

## 2020-12-02 ENCOUNTER — Emergency Department
Admission: EM | Admit: 2020-12-02 | Discharge: 2020-12-02 | Disposition: A | Discharge status: Home / Self Care | Payer: BLUE CROSS/BLUE SHIELD | Attending: Emergency Medicine | Admitting: Emergency Medicine

## 2020-12-02 ENCOUNTER — Other Ambulatory Visit: Payer: Self-pay

## 2020-12-02 DIAGNOSIS — X58XXXA Exposure to other specified factors, initial encounter: Secondary | ICD-10-CM | POA: Insufficient documentation

## 2020-12-02 DIAGNOSIS — N390 Urinary tract infection, site not specified: Secondary | ICD-10-CM | POA: Insufficient documentation

## 2020-12-02 DIAGNOSIS — S39012A Strain of muscle, fascia and tendon of lower back, initial encounter: Secondary | ICD-10-CM | POA: Insufficient documentation

## 2020-12-02 DIAGNOSIS — J45909 Unspecified asthma, uncomplicated: Secondary | ICD-10-CM | POA: Insufficient documentation

## 2020-12-02 DIAGNOSIS — F17210 Nicotine dependence, cigarettes, uncomplicated: Secondary | ICD-10-CM | POA: Insufficient documentation

## 2020-12-02 LAB — URINALYSIS, COMPLETE (UACMP) WITH MICROSCOPIC
Bilirubin Urine: NEGATIVE
Glucose, UA: NEGATIVE mg/dL
Ketones, ur: NEGATIVE mg/dL
Leukocytes,Ua: NEGATIVE
Nitrite: NEGATIVE
Protein, ur: NEGATIVE mg/dL
Specific Gravity, Urine: 1.017 (ref 1.005–1.030)
pH: 6 (ref 5.0–8.0)

## 2020-12-02 LAB — POC URINE PREG, ED: Preg Test, Ur: NEGATIVE

## 2020-12-02 MED ORDER — NAPROXEN 500 MG PO TABS: 500.0000 mg | ORAL_TABLET | Freq: Two times a day (BID) | ORAL | Status: DC

## 2020-12-02 MED ORDER — LIDOCAINE 5 % EX PTCH
1.0000 | MEDICATED_PATCH | CUTANEOUS | Status: DC
  Administered 2020-12-02: 1 via TRANSDERMAL
  Filled 2020-12-02: qty 1

## 2020-12-02 MED ORDER — ORPHENADRINE CITRATE ER 100 MG PO TB12: 100.0000 mg | ORAL_TABLET | Freq: Two times a day (BID) | ORAL | 1 refills | Status: AC

## 2020-12-02 NOTE — Discharge Instructions (Addendum)
Follow discharge care instruction take medication as directed. °

## 2020-12-02 NOTE — ED Notes (Signed)
See triage note  Presents with lower back pain  States pain started couple of days ago  denies any injury  States pain radiates into right leg  Ambulates well to room

## 2020-12-02 NOTE — ED Triage Notes (Signed)
Pt c/o lower back pain since Saturday, denies injury. Pt is ambulatory with a steady gait

## 2020-12-02 NOTE — ED Provider Notes (Incomplete)
[facility] Emergency Department Provider Note   ____________________________________________   Event Date/Time   First MD Initiated Contact with Patient 12/02/20 1026     (approximate)  I have reviewed the triage vital signs and the nursing notes.   HISTORY  Chief Complaint Back Pain    HPI Brandi [name] is a 25 y.o. female patient presents with 4 days of right low back pain        {**SYMPTOM/COMPLAINT  LOCATION (describe anatomically) DURATION (when did it start) TIMING (onset and pattern) SEVERITY (0-10, mild/moderate/severe) QUALITY (description of symptoms) CONTEXT (recent surgery, new meds, activity, etc.) MODIFYINGFACTORS (what makes it better/worse) ASSOCIATEDSYMPTOMS (pertinent positives and negatives)**} Past Medical History:  Diagnosis Date  . Asthma     Patient Active Problem List   Diagnosis Date Noted  . Elevated BP without diagnosis of hypertension 01/24/2020  . Syncope 03/02/2019    History reviewed. No pertinent surgical history.  Prior to Admission medications   Medication Sig Start Date End Date Taking? Authorizing Provider  acetaminophen (TYLENOL) 325 MG tablet Take 650 mg by mouth every 6 (six) hours as needed for mild pain.    [provider]  dicyclomine (BENTYL) 10 MG capsule Take 1 capsule (10 mg total) by mouth every 6 (six) hours as needed for up to 7 days for spasms (abdominal pain). 12/04/19 12/11/19  Sharman Cheek, MD  norethindrone [facility]) 0.35 MG tablet  12/27/19   [provider]  sucralfate (CARAFATE) 1 g tablet Take 1 tablet (1 g total) by mouth 4 (four) times daily -  with meals and at bedtime for 10 days. 10/23/18 11/02/18  Henderly, Britni A, PA-C  Triprolidine-Pseudoephedrine (ANTIHISTAMINE PO) Take 3 tablets by mouth daily as needed (allergies).    [provider]    Allergies Shellfish allergy  No family history on file.  Social History Social History   Tobacco  Use  . Smoking status: Current Every Day Smoker    Types: Cigarettes  . Smokeless tobacco: Never Used  Vaping Use  . Vaping Use: Never used  Substance Use Topics  . Alcohol use: Yes  . Drug use: Yes    Types: Marijuana    Review of Systems {** Revise as appropriate then delete this line - Documentation of 10 systems is required  **} Constitutional: No fever/chills Eyes: No visual changes. ENT: No sore throat. Cardiovascular: Denies chest pain. Respiratory: Denies shortness of breath. Gastrointestinal: No abdominal pain.  No nausea, no vomiting.  No diarrhea.  No constipation. Genitourinary: Negative for dysuria. Musculoskeletal: Negative for back pain. Skin: Negative for rash. Neurological: Negative for headaches, focal weakness or numbness. {**Psychiatric:  Endocrine:  Hematological/Lymphatic:  Allergic/Immunilogical: **}  ____________________________________________   PHYSICAL EXAM:  VITAL SIGNS: ED Triage Vitals  Enc Vitals Group     BP 12/02/20 0942 (!) 144/84     Pulse Rate 12/02/20 0942 83     Resp 12/02/20 0942 15     Temp 12/02/20 0942 97.9 F (36.6 C)     Temp Source 12/02/20 0942 Oral     SpO2 12/02/20 0942 100 %     Weight 12/02/20 0942 220 lb (99.8 kg)     Height 12/02/20 0942 5\' 2"  (1.575 m)     Head Circumference --      Peak Flow --      Pain Score 12/02/20 0941 8     Pain Loc --      Pain Edu? --      Excl.  in GC? --    {** Revise as appropriate then delete this line - 8 systems required **} Constitutional: Alert and oriented. Well appearing and in no acute distress. Eyes: Conjunctivae are normal. PERRL. EOMI. Head: Atraumatic. Nose: No congestion/rhinnorhea. Mouth/Throat: Mucous membranes are moist.  Oropharynx non-erythematous. Neck: No stridor.  {**No cervical spine tenderness to palpation.**} {**Hematological/Lymphatic/Immunilogical: No cervical lymphadenopathy. **}Cardiovascular: Normal rate, regular rhythm. Grossly normal heart  sounds.  Good peripheral circulation. Respiratory: Normal respiratory effort.  No retractions. Lungs CTAB. Gastrointestinal: Soft and nontender. No distention. No abdominal bruits. No CVA tenderness. {**Genitourinary:  **}Musculoskeletal: No lower extremity tenderness nor edema.  No joint effusions. Neurologic:  Normal speech and language. No gross focal neurologic deficits are appreciated. No gait instability. Skin:  Skin is warm, dry and intact. No rash noted. Psychiatric: Mood and affect are normal. Speech and behavior are normal.  ____________________________________________   LABS (all labs ordered are listed, but only abnormal results are displayed)  Labs Reviewed  URINALYSIS, COMPLETE (UACMP) WITH MICROSCOPIC  POC URINE PREG, ED   ____________________________________________  EKG  *** ____________________________________________  RADIOLOGY I, Joni Reining, personally viewed and evaluated these images (plain radiographs) as part of my medical decision making, as well as reviewing the written report by the radiologist.  ED MD interpretation:  ***  Official radiology report(s): No results found.  ____________________________________________   PROCEDURES  Procedure(s) performed (including Critical Care):  Procedures   ____________________________________________   INITIAL IMPRESSION / ASSESSMENT AND PLAN / ED COURSE  As part of my medical decision making, I reviewed the following data within the electronic MEDICAL RECORD NUMBER {[id number]::"Notes from prior ED visits","[place] Controlled Substance Database"}        ***      ____________________________________________   FINAL CLINICAL IMPRESSION(S) / ED DIAGNOSES  Final diagnoses:  None     ED Discharge Orders    None      *Please note:  Brandi [name] was evaluated in Emergency Department on 12/02/2020 for the symptoms described in the history of present illness. She was evaluated in the context of the  global COVID-19 pandemic, which necessitated consideration that the patient might be at risk for infection with the SARS-CoV-2 virus that causes COVID-19. Institutional protocols and algorithms that pertain to the evaluation of patients at risk for COVID-19 are in a state of rapid change based on information released by regulatory bodies including the CDC and federal and state organizations. These policies and algorithms were followed during the patient's care in the ED.  Some ED evaluations and interventions may be delayed as a result of limited staffing during and the pandemic.*   Note:  This document was prepared using Dragon voice recognition software and may include unintentional dictation errors.

## 2020-12-05 ENCOUNTER — Telehealth: Payer: Self-pay

## 2020-12-05 NOTE — Telephone Encounter (Signed)
After receiving an ER referral, called pt. She technically lives in [facility] in [place], but per [place] she can be seen here. I mailed an application that has the [id number] form and the letter of support form.   MD 12/05/20 @ 2:13 pm

## 2020-12-19 NOTE — ED Provider Notes (Signed)
[facility] Emergency Department Provider Note   ____________________________________________   Event Date/Time   First MD Initiated Contact with Patient 12/02/20 1026     (approximate)  I have reviewed the triage vital signs and the nursing notes.   HISTORY  Chief Complaint Back Pain    HPI Brandi [name] is a 25 y.o. female patient complaining of 2 days of low back pain.  Patient denies provocative incident for complaint.  Patient states there is radicular component to the right lower leg.  Patient denies bladder or bowel dysfunction.  Rates pain as 8/10.  Described pain as "achy".  No palliative measure for complaint.         Past Medical History:  Diagnosis Date  . Asthma     Patient Active Problem List   Diagnosis Date Noted  . Elevated BP without diagnosis of hypertension 01/24/2020  . Syncope 03/02/2019    History reviewed. No pertinent surgical history.  Prior to Admission medications   Medication Sig Start Date End Date Taking? Authorizing Provider  naproxen (NAPROSYN) 500 MG tablet Take 1 tablet (500 mg total) by mouth 2 (two) times daily with a meal. 12/02/20  Yes Joni Reining, PA-C  orphenadrine (NORFLEX) 100 MG tablet Take 1 tablet (100 mg total) by mouth 2 (two) times daily. 12/02/20 12/02/21 Yes Joni Reining, PA-C  acetaminophen (TYLENOL) 325 MG tablet Take 650 mg by mouth every 6 (six) hours as needed for mild pain.    [provider]  dicyclomine (BENTYL) 10 MG capsule Take 1 capsule (10 mg total) by mouth every 6 (six) hours as needed for up to 7 days for spasms (abdominal pain). 12/04/19 12/11/19  Sharman Cheek, MD  norethindrone [facility]) 0.35 MG tablet  12/27/19   [provider]  sucralfate (CARAFATE) 1 g tablet Take 1 tablet (1 g total) by mouth 4 (four) times daily -  with meals and at bedtime for 10 days. 10/23/18 11/02/18  Henderly, Britni A, PA-C  Triprolidine-Pseudoephedrine (ANTIHISTAMINE PO) Take 3  tablets by mouth daily as needed (allergies).    [provider]    Allergies Shellfish allergy  No family history on file.  Social History Social History   Tobacco Use  . Smoking status: Current Every Day Smoker    Types: Cigarettes  . Smokeless tobacco: Never Used  Vaping Use  . Vaping Use: Never used  Substance Use Topics  . Alcohol use: Yes  . Drug use: Yes    Types: Marijuana    Review of Systems Constitutional: No fever/chills Eyes: No visual changes. ENT: No sore throat. Cardiovascular: Denies chest pain. Respiratory: Denies shortness of breath. Gastrointestinal: No abdominal pain.  No nausea, no vomiting.  No diarrhea.  No constipation. Genitourinary: Negative for dysuria. Musculoskeletal: Positive for back pain. Skin: Negative for rash. Neurological: Negative for headaches, focal weakness or numbness. Allergic/Immunilogical: Shellfish. ____________________________________________   PHYSICAL EXAM:  VITAL SIGNS: ED Triage Vitals  Enc Vitals Group     BP 12/02/20 0942 (!) 144/84     Pulse Rate 12/02/20 0942 83     Resp 12/02/20 0942 15     Temp 12/02/20 0942 97.9 F (36.6 C)     Temp Source 12/02/20 0942 Oral     SpO2 12/02/20 0942 100 %     Weight 12/02/20 0942 220 lb (99.8 kg)     Height 12/02/20 0942 5\' 2"  (1.575 m)     Head Circumference --      Peak  Flow --      Pain Score 12/02/20 0941 8     Pain Loc --      Pain Edu? --      Excl. in GC? --    Constitutional: Alert and oriented. Well appearing and in no acute distress. Neck:No cervical spine tenderness to palpation. Hematological/Lymphatic/Immunilogical: No cervical lymphadenopathy. Cardiovascular: Normal rate, regular rhythm. Grossly normal heart sounds.  Good peripheral circulation. Respiratory: Normal respiratory effort.  No retractions. Lungs CTAB. Gastrointestinal: Soft and nontender. No distention. No abdominal bruits. No CVA tenderness. Genitourinary:  Deferred Musculoskeletal: No obvious lumbar spine deformity.  No lower extremity tenderness nor edema.  No joint effusions. Neurologic:  Normal speech and language. No gross focal neurologic deficits are appreciated. No gait instability. Skin:  Skin is warm, dry and intact. No rash noted. Psychiatric: Mood and affect are normal. Speech and behavior are normal.  ____________________________________________   LABS (all labs ordered are listed, but only abnormal results are displayed)  Labs Reviewed  URINALYSIS, COMPLETE (UACMP) WITH MICROSCOPIC - Abnormal; Notable for the following components:      Result Value   Color, Urine YELLOW (*)    APPearance CLEAR (*)    Hgb urine dipstick MODERATE (*)    Bacteria, UA MANY (*)    All other components within normal limits  POC URINE PREG, ED   ____________________________________________  EKG   ____________________________________________  RADIOLOGY I, Joni Reining, personally viewed and evaluated these images (plain radiographs) as part of my medical decision making, as well as reviewing the written report by the radiologist.  ED MD interpretation:    Official radiology report(s): No results found.  ____________________________________________   PROCEDURES  Procedure(s) performed (including Critical Care):  Procedures   ____________________________________________   INITIAL IMPRESSION / ASSESSMENT AND PLAN / ED COURSE  As part of my medical decision making, I reviewed the following data within the electronic MEDICAL RECORD NUMBER         Patient presents with low back pain with radicular component to the right lower extremity.  Patient complaint physical exam consistent with lumbar strain.  Patient given discharge care instruction advised take medication as directed.  Patient [facility]  Advised to retest urine in 1 week since she is asymptomatic at this time.       ____________________________________________   FINAL CLINICAL IMPRESSION(S) / ED DIAGNOSES  Final diagnoses:  Strain of lumbar region, initial encounter  Lower urinary tract infectious disease     ED Discharge Orders         Ordered    orphenadrine (NORFLEX) 100 MG tablet  2 times daily        12/02/20 1031    naproxen (NAPROSYN) 500 MG tablet  2 times daily with meals        12/02/20 1031          *Please note:  Brandi [name] was evaluated in Emergency Department on 12/19/2020 for the symptoms described in the history of present illness. She was evaluated in the context of the global COVID-19 pandemic, which necessitated consideration that the patient might be at risk for infection with the SARS-CoV-2 virus that causes COVID-19. Institutional protocols and algorithms that pertain to the evaluation of patients at risk for COVID-19 are in a state of rapid change based on information released by regulatory bodies including the CDC and federal and state organizations. These policies and algorithms were followed during the patient's care in the ED.  Some  ED evaluations and interventions may be delayed as a result of limited staffing during and the pandemic.*   Note:  This document was prepared using Dragon voice recognition software and may include unintentional dictation errors.    Joni Reining, PA-C 12/19/20 1513    Chesley Noon, MD 12/25/20 [phone]

## 2021-01-01 ENCOUNTER — Other Ambulatory Visit: Payer: Self-pay

## 2021-01-01 ENCOUNTER — Encounter: Payer: Self-pay | Admitting: Emergency Medicine

## 2021-01-01 DIAGNOSIS — Z5321 Procedure and treatment not carried out due to patient leaving prior to being seen by health care provider: Secondary | ICD-10-CM | POA: Insufficient documentation

## 2021-01-01 DIAGNOSIS — R1013 Epigastric pain: Secondary | ICD-10-CM | POA: Insufficient documentation

## 2021-01-01 DIAGNOSIS — R112 Nausea with vomiting, unspecified: Secondary | ICD-10-CM | POA: Insufficient documentation

## 2021-01-01 LAB — POC URINE PREG, ED: Preg Test, Ur: POSITIVE — AB

## 2021-01-01 LAB — CBC
HCT: 38.2 % (ref 36.0–46.0)
Hemoglobin: 12.9 g/dL (ref 12.0–15.0)
MCH: 29.7 pg (ref 26.0–34.0)
MCHC: 33.8 g/dL (ref 30.0–36.0)
MCV: 88 fL (ref 80.0–100.0)
Platelets: 425 10*3/uL — ABNORMAL HIGH (ref 150–400)
RBC: 4.34 MIL/uL (ref 3.87–5.11)
RDW: 12.8 % (ref 11.5–15.5)
WBC: 13 10*3/uL — ABNORMAL HIGH (ref 4.0–10.5)
nRBC: 0 % (ref 0.0–0.2)

## 2021-01-01 MED ORDER — ONDANSETRON 4 MG PO TBDP
4.0000 mg | ORAL_TABLET | Freq: Once | ORAL | Status: DC | PRN
  Filled 2021-01-01: qty 1

## 2021-01-01 NOTE — ED Triage Notes (Signed)
Pt to ED from home c/o mid epigastric pain and n/v today.  Denies diarrhea or urinary changes.  States chills, fevers, and family members being sick at home with similar symptoms.

## 2021-01-02 ENCOUNTER — Emergency Department
Admission: EM | Admit: 2021-01-02 | Discharge: 2021-01-02 | Disposition: A | Discharge status: Home / Self Care | Payer: Self-pay | Attending: Emergency Medicine | Admitting: Emergency Medicine

## 2021-01-02 LAB — COMPREHENSIVE METABOLIC PANEL
ALT: 20 U/L (ref 0–44)
AST: 22 U/L (ref 15–41)
Albumin: 4.1 g/dL (ref 3.5–5.0)
Alkaline Phosphatase: 72 U/L (ref 38–126)
Anion gap: 13 (ref 5–15)
BUN: 12 mg/dL (ref 6–20)
CO2: 18 mmol/L — ABNORMAL LOW (ref 22–32)
Calcium: 9.1 mg/dL (ref 8.9–10.3)
Chloride: 109 mmol/L (ref 98–111)
Creatinine, Ser: 0.7 mg/dL (ref 0.44–1.00)
GFR, Estimated: >60 mL/min (ref >60)
Glucose, Bld: 173 mg/dL — ABNORMAL HIGH (ref 70–99)
Potassium: 3.5 mmol/L (ref 3.5–5.1)
Sodium: 140 mmol/L (ref 135–145)
Total Bilirubin: 0.8 mg/dL (ref 0.3–1.2)
Total Protein: 8.1 g/dL (ref 6.5–8.1)

## 2021-01-02 LAB — URINALYSIS, COMPLETE (UACMP) WITH MICROSCOPIC
Bilirubin Urine: NEGATIVE
Glucose, UA: NEGATIVE mg/dL
Ketones, ur: 20 mg/dL — AB
Leukocytes,Ua: NEGATIVE
Nitrite: NEGATIVE
Protein, ur: 30 mg/dL — AB
Specific Gravity, Urine: 1.029 (ref 1.005–1.030)
WBC, UA: NONE SEEN WBC/hpf (ref 0–5)
pH: 5 (ref 5.0–8.0)

## 2021-01-02 LAB — LIPASE, BLOOD: Lipase: 23 U/L (ref 11–51)

## 2021-01-02 LAB — HCG, QUANTITATIVE, PREGNANCY: hCG, Beta Chain, Quant, S: <1 m[IU]/mL (ref <5)

## 2021-01-02 NOTE — ED Notes (Signed)
No answer when called several times from lobby 

## 2021-01-02 NOTE — ED Notes (Addendum)
No answer when called several times from lobby; no answer when phone number listed in chart called

## 2021-04-23 ENCOUNTER — Other Ambulatory Visit: Payer: Self-pay

## 2021-04-23 ENCOUNTER — Encounter: Payer: Self-pay | Admitting: Emergency Medicine

## 2021-04-23 ENCOUNTER — Emergency Department: Payer: Self-pay

## 2021-04-23 ENCOUNTER — Emergency Department
Admission: EM | Admit: 2021-04-23 | Discharge: 2021-04-23 | Disposition: A | Discharge status: Home / Self Care | Payer: Self-pay | Attending: Emergency Medicine | Admitting: Emergency Medicine

## 2021-04-23 DIAGNOSIS — R1030 Lower abdominal pain, unspecified: Secondary | ICD-10-CM

## 2021-04-23 DIAGNOSIS — F17210 Nicotine dependence, cigarettes, uncomplicated: Secondary | ICD-10-CM | POA: Insufficient documentation

## 2021-04-23 DIAGNOSIS — R102 Pelvic and perineal pain: Secondary | ICD-10-CM | POA: Insufficient documentation

## 2021-04-23 DIAGNOSIS — J45909 Unspecified asthma, uncomplicated: Secondary | ICD-10-CM | POA: Insufficient documentation

## 2021-04-23 DIAGNOSIS — R1032 Left lower quadrant pain: Secondary | ICD-10-CM

## 2021-04-23 DIAGNOSIS — R112 Nausea with vomiting, unspecified: Secondary | ICD-10-CM | POA: Insufficient documentation

## 2021-04-23 LAB — COMPREHENSIVE METABOLIC PANEL
ALT: 14 U/L (ref 0–44)
AST: 16 U/L (ref 15–41)
Albumin: 4.1 g/dL (ref 3.5–5.0)
Alkaline Phosphatase: 67 U/L (ref 38–126)
Anion gap: 11 (ref 5–15)
BUN: 12 mg/dL (ref 6–20)
CO2: 26 mmol/L (ref 22–32)
Calcium: 9.5 mg/dL (ref 8.9–10.3)
Chloride: 104 mmol/L (ref 98–111)
Creatinine, Ser: 0.86 mg/dL (ref 0.44–1.00)
GFR, Estimated: >60 mL/min (ref >60)
Glucose, Bld: 164 mg/dL — ABNORMAL HIGH (ref 70–99)
Potassium: 4 mmol/L (ref 3.5–5.1)
Sodium: 141 mmol/L (ref 135–145)
Total Bilirubin: 0.5 mg/dL (ref 0.3–1.2)
Total Protein: 7.8 g/dL (ref 6.5–8.1)

## 2021-04-23 LAB — URINALYSIS, COMPLETE (UACMP) WITH MICROSCOPIC
Bilirubin Urine: NEGATIVE
Glucose, UA: 150 mg/dL — AB
Ketones, ur: 5 mg/dL — AB
Nitrite: NEGATIVE
Protein, ur: NEGATIVE mg/dL
RBC / HPF: >50 RBC/hpf — ABNORMAL HIGH (ref 0–5)
Specific Gravity, Urine: 1.013 (ref 1.005–1.030)
pH: 9 — ABNORMAL HIGH (ref 5.0–8.0)

## 2021-04-23 LAB — LIPASE, BLOOD: Lipase: 27 U/L (ref 11–51)

## 2021-04-23 LAB — CBC
HCT: 37.2 % (ref 36.0–46.0)
Hemoglobin: 12.5 g/dL (ref 12.0–15.0)
MCH: 30.9 pg (ref 26.0–34.0)
MCHC: 33.6 g/dL (ref 30.0–36.0)
MCV: 91.9 fL (ref 80.0–100.0)
Platelets: 405 10*3/uL — ABNORMAL HIGH (ref 150–400)
RBC: 4.05 MIL/uL (ref 3.87–5.11)
RDW: 13.2 % (ref 11.5–15.5)
WBC: 10.6 10*3/uL — ABNORMAL HIGH (ref 4.0–10.5)
nRBC: 0 % (ref 0.0–0.2)

## 2021-04-23 LAB — CHLAMYDIA/NGC RT PCR (ARMC ONLY)
Chlamydia Tr: NOT DETECTED
N gonorrhoeae: NOT DETECTED

## 2021-04-23 LAB — POC URINE PREG, ED: Preg Test, Ur: NEGATIVE

## 2021-04-23 LAB — HCG, QUANTITATIVE, PREGNANCY: hCG, Beta Chain, Quant, S: <1 m[IU]/mL (ref <5)

## 2021-04-23 MED ORDER — IOHEXOL 350 MG/ML SOLN
100.0000 mL | Freq: Once | INTRAVENOUS | Status: AC | PRN
  Administered 2021-04-23: 100 mL via INTRAVENOUS

## 2021-04-23 MED ORDER — ONDANSETRON 4 MG PO TBDP: 4.0000 mg | ORAL_TABLET | Freq: Once | ORAL | Status: DC | PRN

## 2021-04-23 MED ORDER — SODIUM CHLORIDE 0.9 % IV BOLUS
1000.0000 mL | Freq: Once | INTRAVENOUS | Status: AC
  Administered 2021-04-23: 1000 mL via INTRAVENOUS

## 2021-04-23 MED ORDER — SODIUM CHLORIDE 0.9 % IV SOLN
100.0000 mg | Freq: Once | INTRAVENOUS | Status: AC
  Administered 2021-04-23: 100 mg via INTRAVENOUS
  Filled 2021-04-23: qty 100

## 2021-04-23 MED ORDER — MORPHINE SULFATE (PF) 4 MG/ML IV SOLN
4.0000 mg | Freq: Once | INTRAVENOUS | Status: AC
  Administered 2021-04-23: 4 mg via INTRAVENOUS
  Filled 2021-04-23: qty 1

## 2021-04-23 MED ORDER — ONDANSETRON HCL 4 MG/2ML IJ SOLN
4.0000 mg | Freq: Once | INTRAMUSCULAR | Status: AC
  Administered 2021-04-23: 4 mg via INTRAVENOUS
  Filled 2021-04-23: qty 2

## 2021-04-23 MED ORDER — MORPHINE SULFATE (PF) 2 MG/ML IV SOLN
1.0000 mg | Freq: Once | INTRAVENOUS | Status: AC
  Administered 2021-04-23: 1 mg via INTRAVENOUS
  Filled 2021-04-23: qty 1

## 2021-04-23 MED ORDER — CEFTRIAXONE SODIUM 250 MG IJ SOLR
250.0000 mg | Freq: Once | INTRAMUSCULAR | Status: AC
  Administered 2021-04-23: 250 mg via INTRAMUSCULAR
  Filled 2021-04-23: qty 250

## 2021-04-23 MED ORDER — AMOXICILLIN-POT CLAVULANATE 875-125 MG PO TABS: 1.0000 | ORAL_TABLET | Freq: Two times a day (BID) | ORAL | 0 refills | Status: AC

## 2021-04-23 MED ORDER — DOXYCYCLINE HYCLATE 100 MG PO TBEC: 100.0000 mg | DELAYED_RELEASE_TABLET | Freq: Two times a day (BID) | ORAL | 0 refills | Status: DC

## 2021-04-23 NOTE — Discharge Instructions (Addendum)
Please follow-up with your OB/GYN doctor in the next couple weeks.  Please return here for increasing pain fever or vomiting.  You have what appears to be a urinary tract infection and that pain.  The pain could be caused by many different things but it also could be caused by an pelvic infection.  I will give you some antibiotics.  I will give you Augmentin 1 pill twice a day with food and doxycycline also 1 pill twice a day.  Make sure you do not eat any dairy products with the doxycycline as dairy or calcium can inactivate the doxycycline.  You can take Motrin 3 of the over-the-counter pills 4 times a day or for the over-the-counter pills 3 times a day for several days not more than 5 days for the pain.  You can take Tylenol with the Motrin as it may help.  Be careful to take the Motrin with food so it does not upset your stomach.

## 2021-04-23 NOTE — ED Notes (Signed)
Pt offered Zofran, declined states it doesn't help

## 2021-04-23 NOTE — ED Notes (Signed)
Patient transported to CT 

## 2021-04-23 NOTE — ED Notes (Signed)
Gave patient some ice chips per Dr. Darnelle Catalan

## 2021-04-23 NOTE — ED Provider Notes (Signed)
[facility] Emergency Department Provider Note   ____________________________________________   Event Date/Time   First MD Initiated Contact with Patient 04/23/21 [phone]     (approximate)  I have reviewed the triage vital signs and the nursing notes.   HISTORY  Chief Complaint Abdominal Pain and Emesis    HPI Brandi [name] is a 25 y.o. female who reports sharp crampy abdominal pain starting yesterday that is all across her lower abdomen.  Is worse with movement.  She has nausea and vomiting as long as well.  No dysuria urgency frequency or diarrhea.  Pain is currently severe.  She has had this before.  Review of her old record shows she has had multiple UTIs.  She had a positive pregnancy test in May.  Patient denies any STD exposure   Past Medical History:  Diagnosis Date   Asthma     Patient Active Problem List   Diagnosis Date Noted   Elevated BP without diagnosis of hypertension 01/24/2020   Syncope 03/02/2019    History reviewed. No pertinent surgical history.  Prior to Admission medications   Medication Sig Start Date End Date Taking? Authorizing Provider  amoxicillin-clavulanate (AUGMENTIN) 875-125 MG tablet Take 1 tablet by mouth 2 (two) times daily for 10 days. 04/23/21 05/03/21 Yes Arnaldo Natal, MD  doxycycline (DORYX) 100 MG EC tablet Take 1 tablet (100 mg total) by mouth 2 (two) times daily. 04/23/21  Yes Arnaldo Natal, MD  acetaminophen (TYLENOL) 325 MG tablet Take 650 mg by mouth every 6 (six) hours as needed for mild pain.    [provider]  dicyclomine (BENTYL) 10 MG capsule Take 1 capsule (10 mg total) by mouth every 6 (six) hours as needed for up to 7 days for spasms (abdominal pain). 12/04/19 12/11/19  Sharman Cheek, MD  naproxen (NAPROSYN) 500 MG tablet Take 1 tablet (500 mg total) by mouth 2 (two) times daily with a meal. 12/02/20   Joni Reining, PA-C  norethindrone [facility]) 0.35 MG tablet  12/27/19   [provider]  orphenadrine (NORFLEX) 100 MG tablet Take 1 tablet (100 mg total) by mouth 2 (two) times daily. 12/02/20 12/02/21  Joni Reining, PA-C  sucralfate (CARAFATE) 1 g tablet Take 1 tablet (1 g total) by mouth 4 (four) times daily -  with meals and at bedtime for 10 days. 10/23/18 11/02/18  Henderly, Britni A, PA-C  Triprolidine-Pseudoephedrine (ANTIHISTAMINE PO) Take 3 tablets by mouth daily as needed (allergies).    [provider]    Allergies Shellfish allergy  History reviewed. No pertinent family history.  Social History Social History   Tobacco Use   Smoking status: Every Day    Types: Cigarettes   Smokeless tobacco: Never  Vaping Use   Vaping Use: Never used  Substance Use Topics   Alcohol use: Yes   Drug use: Yes    Types: Marijuana    Review of Systems  Constitutional: No fever/chills Eyes: No visual changes. ENT: No sore throat. Cardiovascular: Denies chest pain. Respiratory: Denies shortness of breath. Gastrointestinal:  abdominal pain.   nausea, vomiting.  No diarrhea.  No constipation. Genitourinary: Negative for dysuria. Musculoskeletal: Negative for back pain. Skin: Negative for rash. Neurological: Negative for headaches, focal weakness  ____________________________________________   PHYSICAL EXAM:  VITAL SIGNS: ED Triage Vitals  Enc Vitals Group     BP 04/23/21 0636 (!) 173/100     Pulse Rate 04/23/21 0636 94     Resp 04/23/21  0636 18     Temp 04/23/21 0636 98.7 F (37.1 C)     Temp Source 04/23/21 0636 Oral     SpO2 04/23/21 0636 100 %     Weight 04/23/21 0639 180 lb (81.6 kg)     Height 04/23/21 0639 5\' 3"  (1.6 m)     Head Circumference --      Peak Flow --      Pain Score 04/23/21 0635 10     Pain Loc --      Pain Edu? --      Excl. in GC? --     Constitutional: Alert and oriented.  Tearful and gritting her teeth and shaking and pain Eyes: Conjunctivae are normal. PER EOMI. Head: Atraumatic. Nose: No  congestion/rhinnorhea. Mouth/Throat: Mucous membranes are moist.  Oropharynx non-erythematous. Neck: No stridor.  Cardiovascular: Normal rate, regular rhythm. Grossly normal heart sounds.  Good peripheral circulation. Respiratory: Normal respiratory effort.  No retractions. Lungs CTAB. Gastrointestinal: Soft tender across lower abdomen no distention. No abdominal bruits. No CVA tenderness. Genitourinary: Patient refuses pelvic exam at this time Musculoskeletal: No lower extremity tenderness nor edema.   Neurologic:  Normal speech and language. No gross focal neurologic deficits are appreciated. No gait instability. Skin:  Skin is warm, dry and intact. No rash noted.   ____________________________________________   LABS (all labs ordered are listed, but only abnormal results are displayed)  Labs Reviewed  COMPREHENSIVE METABOLIC PANEL - Abnormal; Notable for the following components:      Result Value   Glucose, Bld 164 (*)    All other components within normal limits  CBC - Abnormal; Notable for the following components:   WBC 10.6 (*)    Platelets 405 (*)    All other components within normal limits  URINALYSIS, COMPLETE (UACMP) WITH MICROSCOPIC - Abnormal; Notable for the following components:   Color, Urine STRAW (*)    APPearance HAZY (*)    pH 9.0 (*)    Glucose, UA 150 (*)    Hgb urine dipstick LARGE (*)    Ketones, ur 5 (*)    Leukocytes,Ua TRACE (*)    RBC / HPF >50 (*)    Bacteria, UA FEW (*)    All other components within normal limits  URINE CULTURE  CHLAMYDIA/NGC RT PCR ([facility] ONLY)            LIPASE, BLOOD  HCG, QUANTITATIVE, PREGNANCY  POC URINE PREG, ED   ____________________________________________  EKG   ____________________________________________  RADIOLOGY Jill PolingI, Shama Monfils,  Kaida Games F, personally viewed and evaluated these images (plain radiographs) as part of my medical decision making, as well as reviewing the written report by the radiologist.  ED MD  interpretation: CT reviewed by me read by radiology shows no acute disease  Official radiology report(s): CT ABDOMEN PELVIS W CONTRAST  Result Date: 04/23/2021 CLINICAL DATA:  Acute lower abdominal pain. EXAM: CT ABDOMEN AND PELVIS WITH CONTRAST TECHNIQUE: Multidetector CT imaging of the abdomen and pelvis was performed using the standard protocol following bolus administration of intravenous contrast. CONTRAST:  100mL OMNIPAQUE IOHEXOL 350 MG/ML SOLN COMPARISON:  October 23, 2018. FINDINGS: Lower chest: No acute abnormality. Hepatobiliary: No focal liver abnormality is seen. No gallstones, gallbladder wall thickening, or biliary dilatation. Pancreas: Unremarkable. No pancreatic ductal dilatation or surrounding inflammatory changes. Spleen: Normal in size without focal abnormality. Adrenals/Urinary Tract: Adrenal glands are unremarkable. Kidneys are normal, without renal calculi, focal lesion, or hydronephrosis. Bladder is unremarkable. Stomach/Bowel: Stomach is within normal limits.  Appendix appears normal. No evidence of bowel wall thickening, distention, or inflammatory changes. Vascular/Lymphatic: No significant vascular findings are present. No enlarged abdominal or pelvic lymph nodes. Reproductive: Uterus and bilateral adnexa are unremarkable. Other: No abdominal wall hernia or abnormality. No abdominopelvic ascites. Musculoskeletal: No acute or significant osseous findings. IMPRESSION: No definite abnormality seen in the abdomen or pelvis. Electronically Signed   By: Lupita Raider M.D.   On: 04/23/2021 10:17   US PELVIC COMPLETE W TRANSVAGINAL AND TORSION R/O  Result Date: 04/23/2021 CLINICAL DATA:  Pelvic pain EXAM: TRANSABDOMINAL AND TRANSVAGINAL ULTRASOUND OF PELVIS DOPPLER ULTRASOUND OF OVARIES TECHNIQUE: Both transabdominal and transvaginal ultrasound examinations of the pelvis were performed. Transabdominal technique was performed for global imaging of the pelvis including uterus, ovaries,  adnexal regions, and pelvic cul-de-sac. It was necessary to proceed with endovaginal exam following the transabdominal exam to visualize the ovaries. Color and duplex Doppler ultrasound was utilized to evaluate blood flow to the ovaries. COMPARISON:  None. FINDINGS: Uterus Measurements: 8.1 x 3.6 x 4.0 cm = volume: 60 mL. No fibroids or other mass visualized. Endometrium Thickness: 7 mm. This is normal for age. Minimal fluid within endometrial canal. Right ovary Measurements: Not visualized Left ovary Measurements: 1.5 x 1.4 x 1.7 cm = volume: 2 mL. Normal appearance/no adnexal mass. Pulsed Doppler evaluation of left ovary demonstrates normal low-resistance arterial and venous waveforms. Other findings No abnormal free fluid. IMPRESSION: Unremarkable pelvic ultrasound. Right ovary is not visualized sonographically. Electronically Signed   By: Caprice Renshaw M.D.   On: 04/23/2021 12:00    ____________________________________________   PROCEDURES  Procedure(s) performed (including Critical Care):  Procedures   ____________________________________________   INITIAL IMPRESSION / ASSESSMENT AND PLAN / ED COURSE  ----------------------------------------- 10:37 AM on 04/23/2021 ----------------------------------------- Patient's pain is now localized to the very low left pelvis.  Palpation there is still very tender to palpation elsewhere is much less tender.  Discussed with patient pelvic exam versus ultrasound.  Patient wishes ultrasound first we can do that I have ordered it.  As possible patient has ovarian torsion or ruptured cyst or something similar.    ----------------------------------------- 12:55 PM on 04/23/2021 ----------------------------------------- Patient reported a lot of right-sided pain and tenderness with the ultrasound.  There was no tenderness on the left side.  Patient does not want a pelvic exam now.  She will follow-up with OB/GYN she promises to do this.  I discussed with  her the possibility that the pelvic pain could be due to an infection I will give her some Rocephin IM and then doxycycline p.o. with also some Augmentin in case the white cells in the urine represent a UTI.  I am sending a GC and chlamydia self swab and a urine culture as well.  Patient will follow up with her GYN doctor and return here for worsening pain fever vomiting or feeling sicker.  She will also return here if she cannot follow-up with OB/GYN.         ____________________________________________   FINAL CLINICAL IMPRESSION(S) / ED DIAGNOSES  Final diagnoses:  Pelvic pain     ED Discharge Orders          Ordered    doxycycline (DORYX) 100 MG EC tablet  2 times daily        04/23/21 1254    amoxicillin-clavulanate (AUGMENTIN) 875-125 MG tablet  2 times daily        04/23/21 1254  Note:  This document was prepared using Dragon voice recognition software and may include unintentional dictation errors.    Arnaldo Natal, MD 04/23/21 1256

## 2021-04-23 NOTE — ED Notes (Signed)
Pt unable to void at this time. 

## 2021-04-23 NOTE — ED Notes (Signed)
RN attempted to collect urine sample. RN walked into pt room to find pt on toilet at this time.

## 2021-04-23 NOTE — ED Triage Notes (Addendum)
Pt arrived via POV with reports of low abd pain and vomiting for the past day, pt has hx of the same, states she doesn't know what she is dx with and that "they just send me home"  Pt has some emesis in bag on arrival to triage room.  Pt states she has seen GYN but no one has done anything.

## 2021-04-23 NOTE — ED Notes (Signed)
Pt return from CT.

## 2021-04-25 LAB — URINE CULTURE

## 2021-11-23 ENCOUNTER — Other Ambulatory Visit: Payer: Self-pay

## 2021-11-23 ENCOUNTER — Emergency Department
Admission: EM | Admit: 2021-11-23 | Discharge: 2021-11-23 | Disposition: A | Discharge status: Home / Self Care | Payer: Medicaid Other | Attending: Emergency Medicine | Admitting: Emergency Medicine

## 2021-11-23 DIAGNOSIS — D696 Thrombocytopenia, unspecified: Secondary | ICD-10-CM | POA: Insufficient documentation

## 2021-11-23 DIAGNOSIS — R000 Tachycardia, unspecified: Secondary | ICD-10-CM | POA: Insufficient documentation

## 2021-11-23 DIAGNOSIS — R112 Nausea with vomiting, unspecified: Secondary | ICD-10-CM

## 2021-11-23 DIAGNOSIS — D72829 Elevated white blood cell count, unspecified: Secondary | ICD-10-CM | POA: Insufficient documentation

## 2021-11-23 DIAGNOSIS — N390 Urinary tract infection, site not specified: Secondary | ICD-10-CM | POA: Insufficient documentation

## 2021-11-23 LAB — COMPREHENSIVE METABOLIC PANEL
ALT: 13 U/L (ref 0–44)
AST: 19 U/L (ref 15–41)
Albumin: 4 g/dL (ref 3.5–5.0)
Alkaline Phosphatase: 63 U/L (ref 38–126)
Anion gap: 9 (ref 5–15)
BUN: 10 mg/dL (ref 6–20)
CO2: 23 mmol/L (ref 22–32)
Calcium: 9 mg/dL (ref 8.9–10.3)
Chloride: 106 mmol/L (ref 98–111)
Creatinine, Ser: 0.72 mg/dL (ref 0.44–1.00)
GFR, Estimated: >60 mL/min (ref >60)
Glucose, Bld: 176 mg/dL — ABNORMAL HIGH (ref 70–99)
Potassium: 3.5 mmol/L (ref 3.5–5.1)
Sodium: 138 mmol/L (ref 135–145)
Total Bilirubin: 0.7 mg/dL (ref 0.3–1.2)
Total Protein: 7.6 g/dL (ref 6.5–8.1)

## 2021-11-23 LAB — CBC WITH DIFFERENTIAL/PLATELET
Abs Immature Granulocytes: 0.03 10*3/uL (ref 0.00–0.07)
Basophils Absolute: 0 10*3/uL (ref 0.0–0.1)
Basophils Relative: 0 %
Eosinophils Absolute: 0 10*3/uL (ref 0.0–0.5)
Eosinophils Relative: 0 %
HCT: 35.9 % — ABNORMAL LOW (ref 36.0–46.0)
Hemoglobin: 11.6 g/dL — ABNORMAL LOW (ref 12.0–15.0)
Immature Granulocytes: 0 %
Lymphocytes Relative: 14 %
Lymphs Abs: 1.6 10*3/uL (ref 0.7–4.0)
MCH: 29.1 pg (ref 26.0–34.0)
MCHC: 32.3 g/dL (ref 30.0–36.0)
MCV: 90.2 fL (ref 80.0–100.0)
Monocytes Absolute: 0.3 10*3/uL (ref 0.1–1.0)
Monocytes Relative: 2 %
Neutro Abs: 9.6 10*3/uL — ABNORMAL HIGH (ref 1.7–7.7)
Neutrophils Relative %: 84 %
Platelets: 436 10*3/uL — ABNORMAL HIGH (ref 150–400)
RBC: 3.98 MIL/uL (ref 3.87–5.11)
RDW: 13.5 % (ref 11.5–15.5)
WBC: 11.5 10*3/uL — ABNORMAL HIGH (ref 4.0–10.5)
nRBC: 0 % (ref 0.0–0.2)

## 2021-11-23 LAB — URINALYSIS, ROUTINE W REFLEX MICROSCOPIC
Bilirubin Urine: NEGATIVE
Glucose, UA: 150 mg/dL — AB
Ketones, ur: 20 mg/dL — AB
Nitrite: NEGATIVE
Protein, ur: 30 mg/dL — AB
RBC / HPF: >50 RBC/hpf — ABNORMAL HIGH (ref 0–5)
Specific Gravity, Urine: 1.019 (ref 1.005–1.030)
pH: 8 (ref 5.0–8.0)

## 2021-11-23 LAB — POC URINE PREG, ED: Preg Test, Ur: NEGATIVE

## 2021-11-23 LAB — TROPONIN I (HIGH SENSITIVITY): Troponin I (High Sensitivity): 4 ng/L (ref <18)

## 2021-11-23 LAB — LIPASE, BLOOD: Lipase: 27 U/L (ref 11–51)

## 2021-11-23 MED ORDER — ONDANSETRON HCL 4 MG PO TABS: 4.0000 mg | ORAL_TABLET | Freq: Every day | ORAL | 0 refills | Status: AC | PRN

## 2021-11-23 MED ORDER — SODIUM CHLORIDE 0.9 % IV SOLN
2.0000 g | Freq: Once | INTRAVENOUS | Status: AC
  Administered 2021-11-23: 2 g via INTRAVENOUS
  Filled 2021-11-23: qty 20

## 2021-11-23 MED ORDER — DROPERIDOL 2.5 MG/ML IJ SOLN
2.5000 mg | Freq: Once | INTRAMUSCULAR | Status: AC
  Administered 2021-11-23: 2.5 mg via INTRAVENOUS
  Filled 2021-11-23: qty 2

## 2021-11-23 MED ORDER — SODIUM CHLORIDE 0.9 % IV BOLUS
1000.0000 mL | Freq: Once | INTRAVENOUS | Status: AC
  Administered 2021-11-23: 1000 mL via INTRAVENOUS

## 2021-11-23 MED ORDER — CEFDINIR 300 MG PO CAPS: 300.0000 mg | ORAL_CAPSULE | Freq: Two times a day (BID) | ORAL | 0 refills | Status: AC

## 2021-11-23 MED ORDER — ONDANSETRON HCL 4 MG/2ML IJ SOLN
4.0000 mg | Freq: Once | INTRAMUSCULAR | Status: AC
  Administered 2021-11-23: 4 mg via INTRAVENOUS
  Filled 2021-11-23: qty 2

## 2021-11-23 NOTE — ED Notes (Signed)
Pt given warm blanket per request.

## 2021-11-23 NOTE — ED Notes (Signed)
RN to bedside to introduce self to pt. Pt moaning in pain. Pt advised she has been sick feeling since 0100. Pt has vomited several times she states.  ?

## 2021-11-23 NOTE — ED Notes (Signed)
E-signature not working at this time. Pt verbalized understanding of D/C instructions, prescriptions and follow up care with no further questions at this time. Pt in NAD and ambulatory at time of D/C.  

## 2021-11-23 NOTE — ED Provider Notes (Signed)
? ?[facility] ?Provider Note ? ? ? Event Date/Time  ? First MD Initiated Contact with Patient 11/23/21 [phone]   ?  (approximate) ? ? ?History  ? ?Nausea ? ? ?HPI ? ?[name] is a 26 y.o. female with history of episodes of nausea and vomiting who comes in with concerns for nausea and vomiting.  Patient denies alcohol use yesterday.  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  She reports symptoms started overnight.  Denies any abdominal pain or diarrhea.  Has multiple episodes of nonbloody nonbilious vomiting. ? ?I reviewed the records and patient had CT imaging done on September 2022 that showed normal gallbladder, and appendix was normal. ? ?I reviewed with patient's been seen previously for nausea and vomiting.  Patient is reported at that time that she did smoke marijuana and was treated with medications for acid reflux, hyperemesis. ? ? ?Physical Exam  ? ?Triage Vital Signs: ?ED Triage Vitals  ?Enc Vitals Group  ?   BP 11/23/21 0815 (!) 168/131  ?   Pulse Rate 11/23/21 0815 (!) 101  ?   Resp 11/23/21 0815 20  ?   Temp 11/23/21 0815 98.6 ?F (37 ?C)  ?   Temp Source 11/23/21 0815 Oral  ?   SpO2 11/23/21 0815 99 %  ?   Weight 11/23/21 0813 190 lb (86.2 kg)  ?   Height 11/23/21 0813 _0  (1.727 m)  ?   Head Circumference --   ?   Peak Flow --   ?   Pain Score 11/23/21 0813 0  ?   Pain Loc --   ?   Pain Edu? --   ?   Excl. in [place]? --   ? ? ?Most recent vital signs: ?Vitals:  ? 11/23/21 0815  ?BP: (!) 168/131  ?Pulse: (!) 101  ?Resp: 20  ?Temp: 98.6 ?F (37 ?C)  ?SpO2: 99%  ? ? ? ?General: Awake, ?CV:  Good peripheral perfusion.  Tachycardic ?Resp:  Normal effort.  ?Abd:  No distention.  ?Other:  Patient is actively vomiting. ? ? ?ED Results / Procedures / Treatments  ? ?Labs ?(all labs ordered are listed, but only abnormal results are displayed) ?Labs Reviewed  ?CBC WITH DIFFERENTIAL/PLATELET  ?COMPREHENSIVE  METABOLIC PANEL  ?LIPASE, BLOOD  ?URINALYSIS, ROUTINE W REFLEX MICROSCOPIC  ?POC URINE PREG, ED  ? ? ? ?EKG ? ?My interpretation of EKG: ? ?Sinus tachycardia rate of 100 without any ST elevation, some T wave inversions in 2 3 aVF V3 through V6 with normal intervals. ? ?RADIOLOGY ? ?None ? ? ?PROCEDURES: ? ?Critical Care performed: No ? ?.1-3 Lead EKG Interpretation ?Performed by: Vanessa Hamtramck, MD ?Authorized by: Vanessa [place], MD  ? ?  Interpretation: abnormal   ?  ECG rate:  100 ?  ECG rate assessment: tachycardic   ?  Rhythm: sinus tachycardia   ?  Ectopy: none   ?  Conduction: normal   ? ? ?MEDICATIONS ORDERED IN ED: ?Medications  ?ondansetron (ZOFRAN) injection 4 mg (4 mg Intravenous Given 11/23/21 0833)  ?sodium chloride 0.9 % bolus 1,000 mL (0 mLs Intravenous Stopped 11/23/21 1055)  ?droperidol (INAPSINE) 2.5 MG/ML injection 2.5 mg (2.5 mg Intravenous Given 11/23/21 1018)  ?cefTRIAXone (ROCEPHIN) 2 g in sodium chloride 0.9 % 100 mL IVPB (0 g Intravenous Stopped 11/23/21 1145)  ?sodium chloride 0.9 % bolus 1,000 mL (1,000 mLs Intravenous New Bag/Given 11/23/21 1106)  ? ? ? ?  IMPRESSION / MDM / ASSESSMENT AND PLAN / ED COURSE  ?I reviewed the triage vital signs and the nursing notes. ?             ?               ? ?Differential diagnosis includes, but is not limited to, cyclic vomiting, gastritis, seems less likely gallstones, appendicitis given no abdominal pains.  We will get pregnancy test.  Labs to look for UTI. ? ?Troponin is negative.  CBC shows slightly elevated white count but similar to prior.  Platelets are slightly elevated but similar to prior.  CMP is overall reassuring.  Lipase is negative. ? ?On repeat evaluation patient was still having some nausea.  Denies any abdominal pain.  We will give a dose of IV droperidol ? ?UA looks concerning for UTI with no squamous cells but many bacteria, 21-50 WBCs and greater than 50 RBCs. ? ?10:56 AM reevaluated patient continues to have a soft nontender abdomen.   She denies any history of kidney stones.  She denies any back pain.  SHe is got no CVA tenderness.  Patient would like to hold off on CT imaging but I discussed with her that if she develops any back pain that which she would need this.  She expressed understanding.  She reports that her nausea is feeling much better.  She is a still little bit tachycardic we will give a liter of fluid, do ceftriaxone ? ? ?12:19 PM reevaluated patient she is tolerated drinking water she would like to be able to go home at this time.  Her abdomen remains soft and nontender and she continues to deny any back pain.  Discussed with patient that if she develops any abdominal pain or back pain she needs to return to the ER immediately but at this time a very low suspicion for septic kidney stone given no prior history of this no flank pain.  We will cover patient with antibiotics for her UTI and have her return to the ER if things are worsening ? ?I considered admission for patient given she technically met sepsis criteria but patient is otherwise well-appearing as tolerated drinking water and I have low concern for bacteremia at this time ? ? ?The patient is on the cardiac monitor to evaluate for evidence of arrhythmia and/or significant heart rate changes. ? ?FINAL CLINICAL IMPRESSION(S) / ED DIAGNOSES  ? ?Final diagnoses:  ?Nausea and vomiting, unspecified vomiting type  ?Urinary tract infection without hematuria, site unspecified  ? ? ? ?Rx / DC Orders  ? ?ED Discharge Orders   ? ? None  ? ?  ? ? ? ?Note:  This document was prepared using Dragon voice recognition software and may include unintentional dictation errors. ?  ?Vanessa Morovis, MD ?11/23/21 1225 ? ?

## 2021-11-23 NOTE — ED Triage Notes (Signed)
Pt in with co nausea since last night states no pan or diarrhea.  ?

## 2021-11-23 NOTE — Discharge Instructions (Addendum)
Your work-up is concerning for a UTI which could be leading to your nausea and vomiting.  We discussed CT imaging but you had no abdominal tenderness or back pain to suggest kidney stone or appendicitis we have opted to hold off however if you develop pain in either these areas need to return to the ER to consider imaging.  In the meantime I am giving you your first dose of antibiotics for your UTI and you start taking the oral medication tomorrow.  Also prescribe some Zofran to help with any nausea.  Can take Tylenol 1 g every 8 hours to help with pain. ?

## 2021-11-23 NOTE — ED Notes (Signed)
Pt able to keep down water  

## 2021-11-23 NOTE — ED Notes (Signed)
Pt ambulated to toilet to urinate.

## 2021-11-24 LAB — URINE CULTURE

## 2022-04-04 ENCOUNTER — Emergency Department
Admission: EM | Admit: 2022-04-04 | Discharge: 2022-04-04 | Disposition: A | Discharge status: Home / Self Care | Payer: Medicaid Other | Attending: Emergency Medicine | Admitting: Emergency Medicine

## 2022-04-04 ENCOUNTER — Other Ambulatory Visit: Payer: Self-pay

## 2022-04-04 ENCOUNTER — Emergency Department: Payer: Medicaid Other

## 2022-04-04 DIAGNOSIS — R112 Nausea with vomiting, unspecified: Secondary | ICD-10-CM | POA: Insufficient documentation

## 2022-04-04 DIAGNOSIS — R1010 Upper abdominal pain, unspecified: Secondary | ICD-10-CM | POA: Insufficient documentation

## 2022-04-04 LAB — COMPREHENSIVE METABOLIC PANEL
ALT: 14 U/L (ref 0–44)
AST: 22 U/L (ref 15–41)
Albumin: 4.4 g/dL (ref 3.5–5.0)
Alkaline Phosphatase: 69 U/L (ref 38–126)
Anion gap: 12 (ref 5–15)
BUN: 10 mg/dL (ref 6–20)
CO2: 21 mmol/L — ABNORMAL LOW (ref 22–32)
Calcium: 9.5 mg/dL (ref 8.9–10.3)
Chloride: 107 mmol/L (ref 98–111)
Creatinine, Ser: 0.79 mg/dL (ref 0.44–1.00)
GFR, Estimated: >60 mL/min (ref >60)
Glucose, Bld: 186 mg/dL — ABNORMAL HIGH (ref 70–99)
Potassium: 3.5 mmol/L (ref 3.5–5.1)
Sodium: 140 mmol/L (ref 135–145)
Total Bilirubin: 0.4 mg/dL (ref 0.3–1.2)
Total Protein: 8.4 g/dL — ABNORMAL HIGH (ref 6.5–8.1)

## 2022-04-04 LAB — CBC
HCT: 39.5 % (ref 36.0–46.0)
Hemoglobin: 12.7 g/dL (ref 12.0–15.0)
MCH: 28.8 pg (ref 26.0–34.0)
MCHC: 32.2 g/dL (ref 30.0–36.0)
MCV: 89.6 fL (ref 80.0–100.0)
Platelets: 413 10*3/uL — ABNORMAL HIGH (ref 150–400)
RBC: 4.41 MIL/uL (ref 3.87–5.11)
RDW: 13 % (ref 11.5–15.5)
WBC: 14 10*3/uL — ABNORMAL HIGH (ref 4.0–10.5)
nRBC: 0 % (ref 0.0–0.2)

## 2022-04-04 LAB — LIPASE, BLOOD: Lipase: 21 U/L (ref 11–51)

## 2022-04-04 LAB — HCG, QUANTITATIVE, PREGNANCY: hCG, Beta Chain, Quant, S: <1 m[IU]/mL (ref <5)

## 2022-04-04 MED ORDER — ONDANSETRON 4 MG PO TBDP: 4.0000 mg | ORAL_TABLET | Freq: Once | ORAL | Status: DC | PRN

## 2022-04-04 MED ORDER — DIPHENHYDRAMINE HCL 50 MG/ML IJ SOLN
25.0000 mg | INTRAMUSCULAR | Status: AC
  Administered 2022-04-04: 25 mg via INTRAVENOUS
  Filled 2022-04-04: qty 1

## 2022-04-04 MED ORDER — LACTATED RINGERS IV BOLUS
1000.0000 mL | Freq: Once | INTRAVENOUS | Status: AC
  Administered 2022-04-04: 1000 mL via INTRAVENOUS

## 2022-04-04 MED ORDER — DROPERIDOL 2.5 MG/ML IJ SOLN
2.5000 mg | Freq: Once | INTRAMUSCULAR | Status: AC
  Administered 2022-04-04: 2.5 mg via INTRAVENOUS
  Filled 2022-04-04: qty 2

## 2022-04-04 MED ORDER — ONDANSETRON 4 MG PO TBDP
ORAL_TABLET | ORAL | Status: AC
  Filled 2022-04-04: qty 1

## 2022-04-04 NOTE — ED Provider Notes (Signed)
[facility] Provider Note    Event Date/Time   First MD Initiated Contact with Patient 04/04/22 0155     (approximate)   History   Abdominal Pain and Vomiting   HPI  History is limited by patient being a vague and minimal historian.  Brandi [name] is a 26 y.o. female who presents for evaluation of upper abdominal pain with vomiting.  She told triage that it started this morning.  She is not telling me much of anything.  She is sitting upright and slumped over and will minimally respond by shaking her head or nodding her head but will not speak to me.  Reportedly she has had similar episodes in the past, and I asked her if this is true and she nodded her head yes but otherwise would not talk.     Physical Exam   Triage Vital Signs: ED Triage Vitals  Enc Vitals Group     BP 04/04/22 0138 (!) 173/114     Pulse Rate 04/04/22 0138 (!) 112     Resp 04/04/22 0138 16     Temp 04/04/22 0138 98.2 F (36.8 C)     Temp Source 04/04/22 0138 Oral     SpO2 04/04/22 0138 99 %     Weight 04/04/22 0137 90.7 kg (200 lb)     Height 04/04/22 0137 1.6 m (5\' 3" )     Head Circumference --      Peak Flow --      Pain Score 04/04/22 0136 9     Pain Loc --      Pain Edu? --      Excl. in GC? --     Most recent vital signs: Vitals:   04/04/22 0430 04/04/22 0500  BP: 109/69 (!) 101/58  Pulse: 80 72  Resp: 15 19  Temp:    SpO2: 93% 97%     General: Awake, in distress but unwilling or unable to speak to describe what is going on. CV:  Good peripheral perfusion.  Tachycardia, regular rhythm. Resp:  Normal effort.  No accessory muscle usage or intercostal retractions. Abd:  Obese.  No specific localized tenderness to palpation.  Patient is not reacting when I am palpating on her abdomen including in the upper abdomen or right upper quadrant, but she vaguely indicated with a hand that the right upper quadrant and epigastrium are the areas that hurt.  No rebound or  guarding.    ED Results / Procedures / Treatments   Labs (all labs ordered are listed, but only abnormal results are displayed) Labs Reviewed  COMPREHENSIVE METABOLIC PANEL - Abnormal; Notable for the following components:      Result Value   CO2 21 (*)    Glucose, Bld 186 (*)    Total Protein 8.4 (*)    All other components within normal limits  CBC - Abnormal; Notable for the following components:   WBC 14.0 (*)    Platelets 413 (*)    All other components within normal limits  LIPASE, BLOOD  URINALYSIS, ROUTINE W REFLEX MICROSCOPIC  HCG, QUANTITATIVE, PREGNANCY  POC URINE PREG, ED     EKG  ED ECG REPORT I, 06/04/22, the attending physician, personally viewed and interpreted this ECG.  Date: 04/04/2022 EKG Time: 1:50 AM Rate: 98 Rhythm: normal sinus rhythm QRS Axis: normal Intervals: normal ST/T Wave abnormalities: Inverted T waves in leads II, 3, V4, V5, V6.  Otherwise unremarkable Narrative Interpretation: no clear evidence of acute  ischemia    RADIOLOGY I viewed and interpreted the patient's right upper quadrant ultrasound and I see no evidence of gallbladder disease.  I also read the radiologist's report, which confirmed no acute findings.    PROCEDURES:  Critical Care performed: No  Procedures   MEDICATIONS ORDERED IN ED: Medications  ondansetron (ZOFRAN-ODT) disintegrating tablet 4 mg ( Oral Canceled Entry 04/04/22 0305)  droperidol (INAPSINE) 2.5 MG/ML injection 2.5 mg (2.5 mg Intravenous Given 04/04/22 0230)  diphenhydrAMINE (BENADRYL) injection 25 mg (25 mg Intravenous Given 04/04/22 0228)  lactated ringers bolus 1,000 mL (1,000 mLs Intravenous New Bag/Given 04/04/22 0157)     IMPRESSION / MDM / ASSESSMENT AND PLAN / ED COURSE  I reviewed the triage vital signs and the nursing notes.                              Differential diagnosis includes, but is not limited to, biliary colic, cyclic vomiting syndrome, cannabinoid hyperemesis syndrome,  obstruction/ileus, gastroparesis, appendicitis.  Patient's presentation is most consistent with acute presentation with potential threat to life or bodily function.  Vital signs are notable for mild tachycardia, otherwise generally reassuring.  Labs ordered include CMP, CBC, lipase.  The patient does not want to provide a urine specimen so I added on an hCG pregnancy test.  The patient is on the cardiac monitor to evaluate for evidence of arrhythmia and/or significant heart rate changes.  Lab results are generally reassuring with no significant abnormalities including no transaminitis or evidence of pancreatitis.  However she could be suffering from biliary colic so I ordered a right upper quadrant ultrasound.  I reviewed the medical record and see that she has had ED visits with similar presentations in the past, and reportedly has had cyclic vomiting syndrome and smokes marijuana.  Cyclic vomiting and/or cannabinoid hyperemesis are also very possible.  QTc is appropriate on EKG with no clear evidence of ischemia and the patient is not reporting any chest pain and she is at low risk for ACS.  Given the probability that it will help all of her symptoms, I ordered droperidol 2.5 mg IV, diphenhydramine 25 mg IV, LR 1 L IV bolus.  I also ordered right upper quadrant ultrasound given the possibility of biliary colic.  (Delayed result) normal right upper quadrant ultrasound.    Clinical Course as of 04/04/22 4132  Sat Apr 04, 2022  0505 Patient's heart rate is down in the 70s and she is resting comfortably.  I woke her up and she says she feels much better and her pain is gone.  She says she is ready to go home.  This seems to be another episode of cyclic vomiting versus cannabinoid hyperemesis.  No indication for further examination at this time.  I gave my usual and customary return precautions and follow-up recommendations. [CF]    Clinical Course User Index [CF] Loleta Rose, MD     FINAL  CLINICAL IMPRESSION(S) / ED DIAGNOSES   Final diagnoses:  Upper abdominal pain  Nausea and vomiting, unspecified vomiting type     Rx / DC Orders   ED Discharge Orders     None        Note:  This document was prepared using Dragon voice recognition software and may include unintentional dictation errors.   Loleta Rose, MD 04/04/22 [phone]

## 2022-04-04 NOTE — ED Triage Notes (Signed)
Pt states upper abd pain with vomiting that began this morning. Pt states did have diarrhea yesterday.

## 2022-04-04 NOTE — Discharge Instructions (Signed)
Your workup in the Emergency Department today was reassuring.  We did not find any specific abnormalities.  We recommend you drink plenty of fluids, take your regular medications and/or any new ones prescribed today, and follow up with the doctor(s) listed in these documents as recommended.  Return to the Emergency Department if you develop new or worsening symptoms that concern you.  

## 2022-04-04 NOTE — ED Notes (Signed)
Pt c/o of abdominal pain that started today. Pt states the abdominal pain feels like a cramping pain. Pt also c/o NV and diarrhea that started yesterday. Pt is not cooperating with answering questions at this time.

## 2022-04-05 ENCOUNTER — Other Ambulatory Visit: Payer: Self-pay

## 2022-04-05 ENCOUNTER — Emergency Department
Admission: EM | Admit: 2022-04-05 | Discharge: 2022-04-05 | Disposition: A | Discharge status: Home / Self Care | Payer: Self-pay | Attending: Emergency Medicine | Admitting: Emergency Medicine

## 2022-04-05 ENCOUNTER — Emergency Department: Payer: Self-pay

## 2022-04-05 ENCOUNTER — Encounter: Payer: Self-pay | Admitting: Emergency Medicine

## 2022-04-05 DIAGNOSIS — R112 Nausea with vomiting, unspecified: Secondary | ICD-10-CM | POA: Insufficient documentation

## 2022-04-05 DIAGNOSIS — E876 Hypokalemia: Secondary | ICD-10-CM | POA: Insufficient documentation

## 2022-04-05 DIAGNOSIS — D75839 Thrombocytosis, unspecified: Secondary | ICD-10-CM | POA: Insufficient documentation

## 2022-04-05 LAB — HCG, QUANTITATIVE, PREGNANCY: hCG, Beta Chain, Quant, S: <1 m[IU]/mL (ref <5)

## 2022-04-05 LAB — CBC WITH DIFFERENTIAL/PLATELET
Abs Immature Granulocytes: 0.03 10*3/uL (ref 0.00–0.07)
Basophils Absolute: 0 10*3/uL (ref 0.0–0.1)
Basophils Relative: 0 %
Eosinophils Absolute: 0 10*3/uL (ref 0.0–0.5)
Eosinophils Relative: 0 %
HCT: 40.6 % (ref 36.0–46.0)
Hemoglobin: 13.1 g/dL (ref 12.0–15.0)
Immature Granulocytes: 0 %
Lymphocytes Relative: 16 %
Lymphs Abs: 1.5 10*3/uL (ref 0.7–4.0)
MCH: 28.8 pg (ref 26.0–34.0)
MCHC: 32.3 g/dL (ref 30.0–36.0)
MCV: 89.2 fL (ref 80.0–100.0)
Monocytes Absolute: 0.2 10*3/uL (ref 0.1–1.0)
Monocytes Relative: 2 %
Neutro Abs: 7.9 10*3/uL — ABNORMAL HIGH (ref 1.7–7.7)
Neutrophils Relative %: 82 %
Platelets: 430 10*3/uL — ABNORMAL HIGH (ref 150–400)
RBC: 4.55 MIL/uL (ref 3.87–5.11)
RDW: 13.1 % (ref 11.5–15.5)
WBC: 9.7 10*3/uL (ref 4.0–10.5)
nRBC: 0 % (ref 0.0–0.2)

## 2022-04-05 LAB — COMPREHENSIVE METABOLIC PANEL
ALT: 14 U/L (ref 0–44)
AST: 18 U/L (ref 15–41)
Albumin: 4.5 g/dL (ref 3.5–5.0)
Alkaline Phosphatase: 65 U/L (ref 38–126)
Anion gap: 13 (ref 5–15)
BUN: 12 mg/dL (ref 6–20)
CO2: 21 mmol/L — ABNORMAL LOW (ref 22–32)
Calcium: 9.4 mg/dL (ref 8.9–10.3)
Chloride: 107 mmol/L (ref 98–111)
Creatinine, Ser: 0.87 mg/dL (ref 0.44–1.00)
GFR, Estimated: >60 mL/min (ref >60)
Glucose, Bld: 159 mg/dL — ABNORMAL HIGH (ref 70–99)
Potassium: 3.2 mmol/L — ABNORMAL LOW (ref 3.5–5.1)
Sodium: 141 mmol/L (ref 135–145)
Total Bilirubin: 0.8 mg/dL (ref 0.3–1.2)
Total Protein: 8.2 g/dL — ABNORMAL HIGH (ref 6.5–8.1)

## 2022-04-05 LAB — TYPE AND SCREEN
ABO/RH(D): O POS
Antibody Screen: NEGATIVE

## 2022-04-05 LAB — PROTIME-INR
INR: 1 (ref 0.8–1.2)
Prothrombin Time: 13.3 seconds (ref 11.4–15.2)

## 2022-04-05 LAB — ETHANOL: Alcohol, Ethyl (B): <10 mg/dL (ref <10)

## 2022-04-05 LAB — LIPASE, BLOOD: Lipase: 37 U/L (ref 11–51)

## 2022-04-05 LAB — APTT: aPTT: 30 seconds (ref 24–36)

## 2022-04-05 MED ORDER — CYCLOBENZAPRINE HCL 10 MG PO TABS
5.0000 mg | ORAL_TABLET | Freq: Once | ORAL | Status: AC
  Administered 2022-04-05: 5 mg via ORAL
  Filled 2022-04-05: qty 1

## 2022-04-05 MED ORDER — FAMOTIDINE 20 MG PO TABS: 20.0000 mg | ORAL_TABLET | Freq: Every day | ORAL | 1 refills | Status: DC

## 2022-04-05 MED ORDER — IOHEXOL 350 MG/ML SOLN
100.0000 mL | Freq: Once | INTRAVENOUS | Status: AC | PRN
Start: 2022-04-05 — End: 2022-04-05
  Administered 2022-04-05: 100 mL via INTRAVENOUS

## 2022-04-05 MED ORDER — PANTOPRAZOLE SODIUM 40 MG IV SOLR
40.0000 mg | Freq: Once | INTRAVENOUS | Status: AC
  Administered 2022-04-05: 40 mg via INTRAVENOUS
  Filled 2022-04-05: qty 10

## 2022-04-05 MED ORDER — SODIUM CHLORIDE 0.9 % IV SOLN
12.5000 mg | Freq: Four times a day (QID) | INTRAVENOUS | Status: DC | PRN
  Filled 2022-04-05: qty 0.5

## 2022-04-05 MED ORDER — LIDOCAINE 5 % EX PTCH
1.0000 | MEDICATED_PATCH | CUTANEOUS | Status: DC
  Administered 2022-04-05: 1 via TRANSDERMAL
  Filled 2022-04-05: qty 1

## 2022-04-05 MED ORDER — SODIUM CHLORIDE 0.9 % IV BOLUS
1000.0000 mL | Freq: Once | INTRAVENOUS | Status: AC
  Administered 2022-04-05: 1000 mL via INTRAVENOUS

## 2022-04-05 MED ORDER — DROPERIDOL 2.5 MG/ML IJ SOLN
2.5000 mg | Freq: Once | INTRAMUSCULAR | Status: AC
  Administered 2022-04-05: 2.5 mg via INTRAVENOUS
  Filled 2022-04-05: qty 2

## 2022-04-05 NOTE — ED Triage Notes (Signed)
Pt via POV from home. Pt c/o hematemesis, pt was seen and discharged for the same yesterday and states this morning it got worse. States the blood is dark. Denies pain. States they just gave her nausea medicine and she was initially better. Pt is A&Ox4 but moaning and groaning in the triage, but denies pain.

## 2022-04-05 NOTE — Discharge Instructions (Addendum)
Please seek medical attention for any high fevers, chest pain, shortness of breath, change in behavior, persistent vomiting, bloody stool or any other new or concerning symptoms.  

## 2022-04-05 NOTE — ED Provider Notes (Signed)
[facility] Provider Note    Event Date/Time   First MD Initiated Contact with Patient 04/05/22 1216     (approximate)   History   Hematemesis   HPI  Brandi [name] is a 26 y.o. female who comes in with concerns for vomiting up blood.  Patient was seen yesterday at 2:43 AM.  Patient ultrasound that time was normal.  CBC shows elevated white count.  Lipase normal CMP normal hemoglobin normal CBC.  Patient was given droperidol and on reassessment patient was discharged and felt to be most likely by cyclic vomiting.    Physical Exam   Triage Vital Signs: ED Triage Vitals  Enc Vitals Group     BP 04/05/22 1215 (!) 164/102     Pulse Rate 04/05/22 1215 78     Resp 04/05/22 1215 18     Temp 04/05/22 1215 98 F (36.7 C)     Temp Source 04/05/22 1215 Oral     SpO2 04/05/22 1215 100 %     Weight 04/05/22 1212 198 lb 6.6 oz (90 kg)     Height 04/05/22 1212 5\' 3"  (1.6 m)     Head Circumference --      Peak Flow --      Pain Score 04/05/22 1212 0     Pain Loc --      Pain Edu? --      Excl. in GC? --     Most recent vital signs: Vitals:   04/05/22 1215  BP: (!) 164/102  Pulse: 78  Resp: 18  Temp: 98 F (36.7 C)  SpO2: 100%     General: Awake, no distress.  CV:  Good peripheral perfusion.  Resp:  Normal effort.  Abd:  Tender throughout Other:     ED Results / Procedures / Treatments   Labs (all labs ordered are listed, but only abnormal results are displayed) Labs Reviewed  CBC WITH DIFFERENTIAL/PLATELET  COMPREHENSIVE METABOLIC PANEL  LIPASE, BLOOD  URINALYSIS, ROUTINE W REFLEX MICROSCOPIC  PROTIME-INR  APTT  POC URINE PREG, ED  TYPE AND SCREEN       RADIOLOGY Pending    PROCEDURES:  Critical Care performed: No  .1-3 Lead EKG Interpretation  Performed by: 06/05/22, MD Authorized by: Concha Se, MD     Interpretation: normal     ECG rate:  70   ECG rate assessment: normal     Rhythm: sinus rhythm      Ectopy: none     Conduction: normal      MEDICATIONS ORDERED IN ED: Medications - No data to display   IMPRESSION / MDM / ASSESSMENT AND PLAN / ED COURSE  I reviewed the triage vital signs and the nursing notes.   Patient's presentation is most consistent with acute presentation with potential threat to life or bodily function.   Differential includes Mallory-Weiss tears, gastric ulcer, lower suspicion for variceal bleeding.  We will give a dose of Protonix given she does report some blood in her vomit.  Will get repeat labs and CT imaging evaluate.  Have obstruction or other acute pathology.  hCG negative, ethanol negative.  CBC reassuring slightly elevated platelets.  CMP showed slightly low potassium to give some IV repletion lipase normal.  On reassessment patient still having significant nausea patient will be on a dose of Phenergan.  Patient handoff to oncoming team for CT, reassessment.  Patient may require admission due to concern for blood in the vomit although  I suspect this is more likely Mallory-Weiss tears.  Her hemoglobin is stable which is reassuring I attempted to do rectal exam but she is currently on her menstruation so there was a lot of blood down in the area so is difficult to tell if there was any blood in the stool.   The patient is on the cardiac monitor to evaluate for evidence of arrhythmia and/or significant heart rate changes.      FINAL CLINICAL IMPRESSION(S) / ED DIAGNOSES   Final diagnoses:  Nausea and vomiting, unspecified vomiting type     Rx / DC Orders   ED Discharge Orders     None        Note:  This document was prepared using Dragon voice recognition software and may include unintentional dictation errors.   Concha Se, MD 04/05/22 [phone]

## 2022-04-05 NOTE — ED Provider Notes (Signed)
Patient did feel better after IV medication. Was able to tolerate PO. However did develop a neck spasm, thus was given lidoderm and flexeril. Given clinical improvement patient felt comfortable with discharge home.   Phineas Semen, MD 04/05/22 Paulo Fruit

## 2022-04-05 NOTE — ED Notes (Signed)
Pt A&O, IV removed, pt given discharge instructions, pt ambulating with steady gait. 

## 2022-04-06 ENCOUNTER — Ambulatory Visit (INDEPENDENT_AMBULATORY_CARE_PROVIDER_SITE_OTHER): Payer: Medicaid Other | Admitting: Family Medicine

## 2022-04-06 ENCOUNTER — Encounter: Payer: Self-pay | Admitting: Family Medicine

## 2022-04-06 ENCOUNTER — Other Ambulatory Visit (HOSPITAL_COMMUNITY)
Admission: RE | Admit: 2022-04-06 | Discharge: 2022-04-06 | Disposition: A | Discharge status: Home / Self Care | Payer: Medicaid Other | Source: Ambulatory Visit | Attending: Family Medicine | Admitting: Family Medicine

## 2022-04-06 VITALS — BP 152/100 | HR 77 | Ht 63.0 in | Wt 218.0 lb

## 2022-04-06 DIAGNOSIS — N921 Excessive and frequent menstruation with irregular cycle: Secondary | ICD-10-CM | POA: Diagnosis not present

## 2022-04-06 DIAGNOSIS — N946 Dysmenorrhea, unspecified: Secondary | ICD-10-CM | POA: Diagnosis not present

## 2022-04-06 DIAGNOSIS — Z113 Encounter for screening for infections with a predominantly sexual mode of transmission: Secondary | ICD-10-CM | POA: Insufficient documentation

## 2022-04-06 DIAGNOSIS — Z01411 Encounter for gynecological examination (general) (routine) with abnormal findings: Secondary | ICD-10-CM

## 2022-04-06 DIAGNOSIS — Z01419 Encounter for gynecological examination (general) (routine) without abnormal findings: Secondary | ICD-10-CM

## 2022-04-06 MED ORDER — SLYND 4 MG PO TABS: 1.0000 | ORAL_TABLET | Freq: Every day | ORAL | 5 refills | Status: DC

## 2022-04-06 MED ORDER — ONDANSETRON 4 MG PO TBDP: 4.0000 mg | ORAL_TABLET | Freq: Four times a day (QID) | ORAL | 6 refills | Status: DC | PRN

## 2022-04-06 NOTE — Progress Notes (Signed)
GYNECOLOGY ANNUAL PREVENTATIVE CARE ENCOUNTER NOTE  Subjective:   Brandi [name] is a 26 y.o. G0P0000 female here for a routine annual gynecologic exam.  Current complaints: painful and heavy periods with irregular cycles.   Reports Aunt with endometriosis and mother with fibroids. She has been on OCPs in the past to help with cycle and they did help her pain.  She had several abdominal pain with cramping and nausea. She often has such severe nausea and vomiting that she vomits up blood.   She would like to be pregnant in the future. She and fiance would like a child but partner has PCOS and patient is concerned about endometriosis or PCOS as well.   Denies abnormal vaginal bleeding, discharge, pelvic pain, problems with intercourse or other gynecologic concerns.    Gynecologic History Patient's last menstrual period was 04/04/2022 (exact date). Contraception:  non-sperm producing partner Last Pap:  at the ER per patient Last mammogram: NA.   Health Maintenance Due  Topic Date Due   COVID-19 Vaccine (1) Never done   HPV VACCINES (1 - 2-dose series) Never done   Hepatitis C Screening  Never done   TETANUS/TDAP  Never done   PAP-Cervical Cytology Screening  Never done   PAP SMEAR-Modifier  Never done   INFLUENZA VACCINE  03/31/2022    The following portions of the patient's history were reviewed and updated as appropriate: allergies, current medications, past family history, past medical history, past social history, past surgical history and problem list.  Review of Systems Pertinent items are noted in HPI.   Objective:  BP (!) 152/100   Pulse 77   Ht 5\' 3"  (1.6 m)   Wt 218 lb (98.9 kg)   LMP 04/04/2022 (Exact Date)   BMI 38.62 kg/m  CONSTITUTIONAL: Well-developed, well-nourished female in no acute distress.  HENT:  Normocephalic, atraumatic, External right and left ear normal. Oropharynx is clear and moist EYES:  No scleral icterus.  NECK: Normal range of motion, supple,  no masses.  Normal thyroid.  SKIN: Skin is warm and dry. No rash noted. Not diaphoretic. No erythema. No pallor. NEUROLOGIC: Alert and oriented to person, place, and time. Normal reflexes, muscle tone coordination. No cranial nerve deficit noted. PSYCHIATRIC: Normal mood and affect. Normal behavior. Normal judgment and thought content. CARDIOVASCULAR: Normal heart rate noted, regular rhythm. 2+ distal pulses. RESPIRATORY: Effort and breath sounds normal, no problems with respiration noted. BREASTS: Symmetric in size. No masses, skin changes, nipple drainage, or lymphadenopathy. ABDOMEN: Soft,  no distention noted.  No tenderness, rebound or guarding.  PELVIC: Normal appearing external genitalia; normal appearing vaginal mucosa and cervix.  No abnormal discharge noted.  Pap smear obtained.  Normal uterine size, no other palpable masses, no uterine or adnexal tenderness. MUSCULOSKELETAL: Normal range of motion.    Assessment and Plan:  1) Annual gynecologic examination with pap smear:  Will follow up results of pap smear and manage accordingly. STI screen also ordered today.  Routine preventative health maintenance measures emphasized.  2) Contraception counseling: Patient only eligible for progesterone only methods due to elevated BP on multiple occasions and high suspicion of chronic HTN.  Slynd was prescribed for patient. She will follow up in  3 month for surveillance.  She was told to call with any further questions, or with any concerns about this method of contraception.  Emphasized use of condoms 100% of the time for STI prevention.  1. Well woman exam with routine gynecological exam - Cytology - PAP -  Cervicovaginal ancillary only( [place]) - HIV Antibody (routine testing w rflx) - Hepatitis B surface antigen - Hepatitis C antibody - RPR  2. Screening examination for venereal disease - Cervicovaginal ancillary only( [place]) - HIV Antibody (routine testing w rflx) -  Hepatitis B surface antigen - Hepatitis C antibody - RPR  3. Dysmenorrhea Initially planned work up for fibroids, PCOS, Endometriosis but patient had FP medicaid which will not cover this.   Explained to patient that I am happy to order them but also recommended possibly applying for [employer], seeking care at [facility] or seeking work up if patient receives full medicaid with medicaid expansion.   Patient is appropriately frustrated as she has been in pain and desires to know why.   Discussed the continuous dosing of slynd will help for PCOS or endometriosis.  Rx for zofran sent for her nausea.  - US PELVIC COMPLETE WITH TRANSVAGINAL; Future  4. Menorrhagia with irregular cycle - US PELVIC COMPLETE WITH TRANSVAGINAL; Future   Please refer to After Visit Summary for other counseling recommendations.   Return in about 3 months (around 07/07/2022) for AUB/endometriosis.  No future appointments.  Federico Flake, MD, MPH, ABFM Attending Physician Center for [facility]

## 2022-04-06 NOTE — Patient Instructions (Signed)
[facility]  [facility]

## 2022-04-06 NOTE — Progress Notes (Signed)
Patient presents for Annual Exam.  Pt went to ER yesterday for sx's of abdominal vomiting.     LMP:04/04/22 pt did not get cycle last months states cycles are irregular. Pt states periods are very heavy  Last pap: 2022 per pt done at ER GC/CT was done.  Contraception: None wants to discuss options STD Screening: Desire pt also wants to screen for BV and yeast.   CC: Possible endometriosis or fibroids. Pt notes pelvic all the time has been a issue for yrs even when not on period.  Pt concerned. Pt has been throwing up blood  sometimes mucous and blood.

## 2022-04-07 LAB — RPR: RPR Ser Ql: NONREACTIVE

## 2022-04-07 LAB — HEPATITIS C ANTIBODY: Hep C Virus Ab: NONREACTIVE

## 2022-04-07 LAB — HEPATITIS B SURFACE ANTIGEN: Hepatitis B Surface Ag: NEGATIVE

## 2022-04-07 LAB — HIV ANTIBODY (ROUTINE TESTING W REFLEX): HIV Screen 4th Generation wRfx: NONREACTIVE

## 2022-04-08 LAB — CERVICOVAGINAL ANCILLARY ONLY
Bacterial Vaginitis (gardnerella): NEGATIVE
Candida Glabrata: NEGATIVE
Candida Vaginitis: NEGATIVE
Chlamydia: NEGATIVE
Comment: NEGATIVE
Comment: NEGATIVE
Comment: NEGATIVE
Comment: NEGATIVE
Comment: NEGATIVE
Comment: NORMAL
Neisseria Gonorrhea: NEGATIVE
Trichomonas: NEGATIVE

## 2022-04-09 LAB — CYTOLOGY - PAP
Diagnosis: NEGATIVE
Diagnosis: REACTIVE

## 2022-04-13 ENCOUNTER — Telehealth: Payer: Self-pay

## 2022-04-13 NOTE — Telephone Encounter (Addendum)
-----   Message from Federico Flake, MD sent at 04/10/2022  6:08 PM EDT ----- NIL, next in 3   Called pt and reviewed results. MyChart password reset for patient.

## 2022-12-28 ENCOUNTER — Emergency Department: Payer: Medicaid Other

## 2022-12-28 ENCOUNTER — Emergency Department
Admission: EM | Admit: 2022-12-28 | Discharge: 2022-12-28 | Disposition: A | Discharge status: Home / Self Care | Payer: Medicaid Other | Attending: Emergency Medicine | Admitting: Emergency Medicine

## 2022-12-28 ENCOUNTER — Other Ambulatory Visit: Payer: Self-pay

## 2022-12-28 DIAGNOSIS — M533 Sacrococcygeal disorders, not elsewhere classified: Secondary | ICD-10-CM | POA: Diagnosis not present

## 2022-12-28 DIAGNOSIS — M545 Low back pain, unspecified: Secondary | ICD-10-CM | POA: Diagnosis not present

## 2022-12-28 DIAGNOSIS — G8929 Other chronic pain: Secondary | ICD-10-CM | POA: Insufficient documentation

## 2022-12-28 DIAGNOSIS — M5459 Other low back pain: Secondary | ICD-10-CM | POA: Diagnosis not present

## 2022-12-28 LAB — URINALYSIS, W/ REFLEX TO CULTURE (INFECTION SUSPECTED)
Bilirubin Urine: NEGATIVE
Glucose, UA: NEGATIVE mg/dL
Ketones, ur: NEGATIVE mg/dL
Nitrite: NEGATIVE
Protein, ur: NEGATIVE mg/dL
Specific Gravity, Urine: 1.012 (ref 1.005–1.030)
pH: 7 (ref 5.0–8.0)

## 2022-12-28 LAB — PREGNANCY, URINE: Preg Test, Ur: NEGATIVE

## 2022-12-28 LAB — POC URINE PREG, ED: Preg Test, Ur: NEGATIVE

## 2022-12-28 MED ORDER — KETOROLAC TROMETHAMINE 15 MG/ML IJ SOLN
15.0000 mg | Freq: Once | INTRAMUSCULAR | Status: AC
  Administered 2022-12-28: 15 mg via INTRAMUSCULAR
  Filled 2022-12-28: qty 1

## 2022-12-28 MED ORDER — LIDOCAINE 5 % EX PTCH: 1.0000 | MEDICATED_PATCH | Freq: Two times a day (BID) | CUTANEOUS | 0 refills | Status: AC

## 2022-12-28 MED ORDER — NAPROXEN 500 MG PO TABS: 500.0000 mg | ORAL_TABLET | Freq: Two times a day (BID) | ORAL | 0 refills | Status: AC

## 2022-12-28 MED ORDER — LIDOCAINE 5 % EX PTCH
1.0000 | MEDICATED_PATCH | CUTANEOUS | Status: DC
Start: 2022-12-28 — End: 2022-12-28
  Administered 2022-12-28: 1 via TRANSDERMAL
  Filled 2022-12-28: qty 1

## 2022-12-28 NOTE — Discharge Instructions (Signed)
Your xrays are normal. Take the medications as prescribed. Please return to the emergency department for any new, worsening, or changing symptoms or other concerns including weakness in your legs, urinary or stool incontinence or retention, numbness or tingling in your extremities/buttocks/groin, fevers, or any other concerns or change in symptoms.
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
## 2022-12-30 LAB — URINE CULTURE

## 2023-01-07 DIAGNOSIS — H52223 Regular astigmatism, bilateral: Secondary | ICD-10-CM | POA: Diagnosis not present

## 2023-01-14 DIAGNOSIS — H5213 Myopia, bilateral: Secondary | ICD-10-CM | POA: Diagnosis not present

## 2023-02-19 DIAGNOSIS — H52223 Regular astigmatism, bilateral: Secondary | ICD-10-CM | POA: Diagnosis not present

## 2023-05-26 ENCOUNTER — Encounter: Payer: Self-pay | Admitting: Emergency Medicine

## 2023-05-26 ENCOUNTER — Other Ambulatory Visit: Payer: Self-pay

## 2023-05-26 ENCOUNTER — Emergency Department
Admission: EM | Admit: 2023-05-26 | Discharge: 2023-05-26 | Disposition: A | Discharge status: Home / Self Care | Payer: Medicaid Other | Attending: Emergency Medicine | Admitting: Emergency Medicine

## 2023-05-26 DIAGNOSIS — M545 Low back pain, unspecified: Secondary | ICD-10-CM | POA: Diagnosis present

## 2023-05-26 DIAGNOSIS — M5431 Sciatica, right side: Secondary | ICD-10-CM | POA: Diagnosis not present

## 2023-05-26 DIAGNOSIS — M5441 Lumbago with sciatica, right side: Secondary | ICD-10-CM | POA: Diagnosis not present

## 2023-05-26 DIAGNOSIS — I10 Essential (primary) hypertension: Secondary | ICD-10-CM | POA: Diagnosis not present

## 2023-05-26 MED ORDER — KETOROLAC TROMETHAMINE 30 MG/ML IJ SOLN
30.0000 mg | Freq: Once | INTRAMUSCULAR | Status: AC
Start: 2023-05-26 — End: 2023-05-26
  Administered 2023-05-26: 30 mg via INTRAMUSCULAR
  Filled 2023-05-26: qty 1

## 2023-05-26 MED ORDER — ACETAMINOPHEN 500 MG PO TABS
1000.0000 mg | ORAL_TABLET | Freq: Once | ORAL | Status: AC
  Administered 2023-05-26: 1000 mg via ORAL
  Filled 2023-05-26: qty 2

## 2023-05-26 MED ORDER — AMLODIPINE BESYLATE 5 MG PO TABS: 5.0000 mg | ORAL_TABLET | Freq: Every day | ORAL | 2 refills | Status: DC

## 2023-05-26 MED ORDER — LIDOCAINE 5 % EX PTCH
2.0000 | MEDICATED_PATCH | Freq: Once | CUTANEOUS | Status: DC
Start: 2023-05-26 — End: 2023-05-26
  Administered 2023-05-26: 2 via TRANSDERMAL
  Filled 2023-05-26: qty 2

## 2023-05-26 NOTE — ED Notes (Signed)
See triage note  Presents with lower back pain and pain going into right leg  Denies any recent injury  Ambulates well
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
## 2023-05-26 NOTE — Discharge Instructions (Addendum)
You were seen in the emergency department for back pain.  Concern that you have sciatica.  You need to follow-up with your primary care physician.  He will likely need to do physical therapy.  You can alternate Motrin and Tylenol for pain control.  You can use over-the-counter Lidoderm patches.  No lifting more than 10 pounds, no bending or twisting.  Your blood pressure was significantly elevated in the emergency department and you were started on a blood pressure medication called amlodipine.  Take this every day until you can follow-up with your primary care provider.  Pain control:  Ibuprofen (motrin/aleve/advil) - You can take 3 tablets (600 mg) every 6 hours as needed for pain/fever.  Acetaminophen (tylenol) - You can take 2 extra strength tablets (1000 mg) every 6 hours as needed for pain/fever.  You can alternate these medications or take them together.  Make sure you eat food/drink water when taking these medications.  Thank you for choosing [place] for your health care, it was my pleasure to care for you today!  Corena Herter, MD
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
## 2023-08-05 ENCOUNTER — Ambulatory Visit (INDEPENDENT_AMBULATORY_CARE_PROVIDER_SITE_OTHER): Payer: Medicaid Other | Admitting: Obstetrics and Gynecology

## 2023-08-05 ENCOUNTER — Encounter: Payer: Self-pay | Admitting: Obstetrics and Gynecology

## 2023-08-05 VITALS — BP 122/79 | HR 80 | Wt 233.0 lb

## 2023-08-05 DIAGNOSIS — Z3041 Encounter for surveillance of contraceptive pills: Secondary | ICD-10-CM | POA: Diagnosis not present

## 2023-08-05 DIAGNOSIS — N946 Dysmenorrhea, unspecified: Secondary | ICD-10-CM

## 2023-08-05 DIAGNOSIS — N920 Excessive and frequent menstruation with regular cycle: Secondary | ICD-10-CM

## 2023-08-05 DIAGNOSIS — N921 Excessive and frequent menstruation with irregular cycle: Secondary | ICD-10-CM

## 2023-08-05 MED ORDER — SLYND 4 MG PO TABS: 1.0000 | ORAL_TABLET | Freq: Every day | ORAL | 1 refills | Status: DC

## 2023-08-05 NOTE — Progress Notes (Signed)
  History:  [name] is a 27 y.o. [id number] who presents to clinic today for birth control refill.  Patient has a history of essential hypertension up to 180s/120s but recently started taking her amlodipine in September. She has a history of heavy menstrual cycles with significant cramps and Slynd was working very effectively to control her symptoms, but she ran out of pills about two months ago. Patient is in a same sex relationship.   The following portions of the patient's history were reviewed and updated as appropriate: allergies, current medications, family history, past medical history, social history, past surgical history and problem list.  Review of Systems:  Review of Systems  Constitutional:  Negative for chills, fever and weight loss.  Cardiovascular:  Negative for chest pain, palpitations and orthopnea.  Gastrointestinal:  Negative for heartburn, nausea and vomiting.  Genitourinary:  Negative for dysuria, frequency and urgency.      Objective:  Physical Exam BP 122/79   Pulse 80   Wt 233 lb (105.7 kg)   BMI 42.62 kg/m  Physical Exam Constitutional:      Appearance: Normal appearance.  HENT:     Head: Normocephalic and atraumatic.  Cardiovascular:     Rate and Rhythm: Normal rate and regular rhythm.  Pulmonary:     Effort: Pulmonary effort is normal.     Breath sounds: Normal breath sounds.  Abdominal:     General: Bowel sounds are normal.     Palpations: Abdomen is soft.  Neurological:     Mental Status: She is alert.   Labs and Imaging Labs, imaging and previous visits in Epic and Care Everywhere reviewed  Assessment & Plan:  1. Dysmenorrhea  Menorrhagia Patient's menstrual symptoms were previously well controlled on Slynd until she ran out. She has started managing her HTN with amlodipine but will recheck her BP in 6 weeks after restarting Slynd to make sure coming off the [facility] didn't help her BP control. Declines routine STD screening Pap neg  2023 - Start Slynd -Continue taking amlodipine daily  -6 week BP follow up  Return in about 6 weeks (around 09/16/2023) for in person, bp check, rn visit.  [place] Bing, MD 08/05/2023

## 2023-08-05 NOTE — Progress Notes (Signed)
Pt here to discuss BC.  Last pap:04/06/22 LMP: unsure   Been off BCP's x 2 months now. Pt did not have refills. Pt wants to get back on Synd. Pt states BCP helped with period cramps  STD Screening: Declined  CC: None

## 2024-02-29 ENCOUNTER — Emergency Department
Admission: EM | Admit: 2024-02-29 | Discharge: 2024-02-29 | Disposition: A | Discharge status: Home / Self Care | Attending: Emergency Medicine | Admitting: Emergency Medicine

## 2024-02-29 ENCOUNTER — Encounter: Payer: Self-pay | Admitting: Emergency Medicine

## 2024-02-29 ENCOUNTER — Other Ambulatory Visit: Payer: Self-pay

## 2024-02-29 DIAGNOSIS — D72829 Elevated white blood cell count, unspecified: Secondary | ICD-10-CM | POA: Diagnosis not present

## 2024-02-29 DIAGNOSIS — J45909 Unspecified asthma, uncomplicated: Secondary | ICD-10-CM | POA: Insufficient documentation

## 2024-02-29 DIAGNOSIS — H6690 Otitis media, unspecified, unspecified ear: Secondary | ICD-10-CM

## 2024-02-29 DIAGNOSIS — E876 Hypokalemia: Secondary | ICD-10-CM | POA: Insufficient documentation

## 2024-02-29 DIAGNOSIS — R112 Nausea with vomiting, unspecified: Secondary | ICD-10-CM | POA: Diagnosis not present

## 2024-02-29 DIAGNOSIS — H6691 Otitis media, unspecified, right ear: Secondary | ICD-10-CM | POA: Diagnosis not present

## 2024-02-29 LAB — COMPREHENSIVE METABOLIC PANEL WITH GFR
ALT: 18 U/L (ref 0–44)
AST: 21 U/L (ref 15–41)
Albumin: 3.9 g/dL (ref 3.5–5.0)
Alkaline Phosphatase: 63 U/L (ref 38–126)
Anion gap: 13 (ref 5–15)
BUN: 12 mg/dL (ref 6–20)
CO2: 23 mmol/L (ref 22–32)
Calcium: 9.5 mg/dL (ref 8.9–10.3)
Chloride: 105 mmol/L (ref 98–111)
Creatinine, Ser: 0.77 mg/dL (ref 0.44–1.00)
GFR, Estimated: >60 mL/min (ref >60)
Glucose, Bld: 136 mg/dL — ABNORMAL HIGH (ref 70–99)
Potassium: 3.3 mmol/L — ABNORMAL LOW (ref 3.5–5.1)
Sodium: 141 mmol/L (ref 135–145)
Total Bilirubin: 0.9 mg/dL (ref 0.0–1.2)
Total Protein: 7.9 g/dL (ref 6.5–8.1)

## 2024-02-29 LAB — CBC
HCT: 38.3 % (ref 36.0–46.0)
Hemoglobin: 12.7 g/dL (ref 12.0–15.0)
MCH: 29.7 pg (ref 26.0–34.0)
MCHC: 33.2 g/dL (ref 30.0–36.0)
MCV: 89.7 fL (ref 80.0–100.0)
Platelets: 430 10*3/uL — ABNORMAL HIGH (ref 150–400)
RBC: 4.27 MIL/uL (ref 3.87–5.11)
RDW: 13.5 % (ref 11.5–15.5)
WBC: 19.1 10*3/uL — ABNORMAL HIGH (ref 4.0–10.5)
nRBC: 0 % (ref 0.0–0.2)

## 2024-02-29 LAB — LIPASE, BLOOD: Lipase: 27 U/L (ref 11–51)

## 2024-02-29 LAB — HCG, QUANTITATIVE, PREGNANCY: hCG, Beta Chain, Quant, S: <1 m[IU]/mL (ref <5)

## 2024-02-29 MED ORDER — ONDANSETRON HCL 4 MG/2ML IJ SOLN: 4.0000 mg | Freq: Once | INTRAMUSCULAR | Status: AC

## 2024-02-29 MED ORDER — SODIUM CHLORIDE 0.9 % IV SOLN
12.5000 mg | Freq: Once | INTRAVENOUS | Status: AC
  Administered 2024-02-29: 12.5 mg via INTRAVENOUS
  Filled 2024-02-29: qty 12.5

## 2024-02-29 MED ORDER — ONDANSETRON HCL 4 MG/2ML IJ SOLN
INTRAMUSCULAR | Status: AC
  Administered 2024-02-29: 4 mg via INTRAVENOUS
  Filled 2024-02-29: qty 2

## 2024-02-29 MED ORDER — AMOXICILLIN-POT CLAVULANATE 875-125 MG PO TABS: 1.0000 | ORAL_TABLET | Freq: Two times a day (BID) | ORAL | 0 refills | Status: AC

## 2024-02-29 MED ORDER — ONDANSETRON HCL 4 MG PO TABS: 4.0000 mg | ORAL_TABLET | Freq: Four times a day (QID) | ORAL | 0 refills | Status: AC | PRN

## 2024-02-29 MED ORDER — PROMETHAZINE HCL 12.5 MG PO TABS: 12.5000 mg | ORAL_TABLET | Freq: Four times a day (QID) | ORAL | 0 refills | Status: DC | PRN

## 2024-02-29 MED ORDER — DROPERIDOL 2.5 MG/ML IJ SOLN
2.5000 mg | Freq: Once | INTRAMUSCULAR | Status: DC
  Filled 2024-02-29: qty 2

## 2024-02-29 MED ORDER — PANTOPRAZOLE SODIUM 40 MG IV SOLR
40.0000 mg | Freq: Once | INTRAVENOUS | Status: AC
  Administered 2024-02-29: 40 mg via INTRAVENOUS
  Filled 2024-02-29: qty 10

## 2024-02-29 MED ORDER — SODIUM CHLORIDE 0.9 % IV BOLUS
1000.0000 mL | Freq: Once | INTRAVENOUS | Status: AC
  Administered 2024-02-29: 1000 mL via INTRAVENOUS

## 2024-02-29 NOTE — ED Provider Notes (Signed)
 [facility] Provider Note    Event Date/Time   First MD Initiated Contact with Patient 02/29/24 0827     (approximate)   History   Emesis   HPI  Brandi [name] is a 28 year old female with history of asthma, marijuana use presenting to the emergency department for evaluation of nausea and vomiting.  Patient reports history of similar episodes related to her menstrual cycle in the past, but is not currently on her period.  Symptoms started yesterday.  No initial blood in her emesis, did note small amounts of blood after multiple episodes.  1 episode of diarrhea, nonbloody.  Denies abdominal pain.  No fevers.    I do note that patient has been seen for nausea and vomiting several times in the past, though her most recent visit in our system is from 2023 for this.  Reviewed visit from 8/6 to 02/17/2022.  At that time, patient presented with vomiting, hematemesis.  Labs reassuring.  CT scan reassuring.  Tolerate p.o. after medications, but did have next as an develop after receiving droperidol .     Physical Exam   Triage Vital Signs: ED Triage Vitals  Encounter Vitals Group     BP 02/29/24 0826 (!) 162/109     Girls Systolic BP Percentile --      Girls Diastolic BP Percentile --      Boys Systolic BP Percentile --      Boys Diastolic BP Percentile --      Pulse Rate 02/29/24 0826 (!) 105     Resp 02/29/24 0826 18     Temp 02/29/24 0826 98.2 F (36.8 C)     Temp Source 02/29/24 0826 Oral     SpO2 02/29/24 0826 95 %     Weight 02/29/24 0825 235 lb (106.6 kg)     Height 02/29/24 0825 5' 3 (1.6 m)     Head Circumference --      Peak Flow --      Pain Score 02/29/24 0825 0     Pain Loc --      Pain Education --      Exclude from Growth Chart --     Most recent vital signs: Vitals:   02/29/24 0826  BP: (!) 162/109  Pulse: (!) 105  Resp: 18  Temp: 98.2 F (36.8 C)  SpO2: 95%     General: Awake, interactive  CV:  Mild tachycardia, good  peripheral perfusion Resp:  Unlabored respirations Abd:  Nondistended, soft, no significant tenderness to palpation Neuro:  Symmetric facial movement, fluid speech   ED Results / Procedures / Treatments   Labs (all labs ordered are listed, but only abnormal results are displayed) Labs Reviewed  COMPREHENSIVE METABOLIC PANEL WITH GFR - Abnormal; Notable for the following components:      Result Value   Potassium 3.3 (*)    Glucose, Bld 136 (*)    All other components within normal limits  CBC - Abnormal; Notable for the following components:   WBC 19.1 (*)    Platelets 430 (*)    All other components within normal limits  LIPASE, BLOOD  HCG, QUANTITATIVE, PREGNANCY  URINALYSIS, ROUTINE W REFLEX MICROSCOPIC  POC URINE PREG, ED     EKG EKG independently reviewed and interpreted by myself demonstrates:  EKG demonstrates sinus rhythm at a rate of 83, PR 156, QRS 86 and QTc 439, nonspecific ST changes, no STEMI  RADIOLOGY Imaging independently reviewed and interpreted by myself demonstrates:  Formal Radiology Read:  No results found.  PROCEDURES:  Critical Care performed: No  Procedures   MEDICATIONS ORDERED IN ED: Medications  sodium chloride  0.9 % bolus 1,000 mL (1,000 mLs Intravenous New Bag/Given 02/29/24 9077)  pantoprazole  (PROTONIX ) injection 40 mg (40 mg Intravenous Given 02/29/24 0923)  ondansetron  (ZOFRAN ) injection 4 mg (4 mg Intravenous Given 02/29/24 0923)  promethazine  (PHENERGAN ) 12.5 mg in sodium chloride  0.9 % 50 mL IVPB (12.5 mg Intravenous New Bag/Given 02/29/24 1054)     IMPRESSION / MDM / ASSESSMENT AND PLAN / ED COURSE  I reviewed the triage vital signs and the nursing notes.  Differential diagnosis includes, but is not limited to, gastritis, viral GI illness, lower suspicion pancreatitis, biliary pathology, other acute intra-abdominal process given reassuring abdominal exam, suspect likely Mallory-Weiss tear causing small amount of hematemesis, much  lower suspicion Boerhaave's or other significant upper GI bleeding process  Patient's presentation is most consistent with acute presentation with potential threat to life or bodily function.  28 year old female presenting to the emergency department for evaluation of nausea and vomiting.  Mild tachycardia on presentation.  EKG with reassuring QTc.  Ordered for IV fluids, nausea medication.  Reassuring abdominal exam with minimal hematemesis, do not think there is an indication for abdominal imaging.  Labs with leukocytosis with WBC of 19.1, normal hemoglobin, CMP with mild hypokalemia, otherwise reassuring, normal lipase.  hCG negative.  Clinical Course as of 02/29/24 1126  Tue Feb 29, 2024  1012 Patient reassessed.  Feels improved.  Updated on results of workup.  When discussing her white blood cell count, she did note that she has been having some right ear pain as well.  On exam, does have some erythema and bulging of her right tympanic membrane, normal left TM.  No evidence of mastoiditis.  Denies urinary symptoms.  Will plan for p.o. trial, likely discharge with oral antibiotics if patient tolerates this. [NR]  1040 No further vomiting, but did have recurrent nausea when attempting p.o. trial.  Will trial Phenergan  to see if this improves patient's nausea. [NR]  1124 Patient reassessed after Phenergan .  Reports feeling improved.  No further vomiting.  She is comfortable with discharge home.  Will DC with prescription for Zofran , Phenergan .  Strict return precautions provided.  Patient discharged stable condition. [NR]    Clinical Course User Index [NR] Levander Slate, MD     FINAL CLINICAL IMPRESSION(S) / ED DIAGNOSES   Final diagnoses:  Nausea and vomiting, unspecified vomiting type     Rx / DC Orders   ED Discharge Orders          Ordered    ondansetron  (ZOFRAN ) 4 MG tablet  Every 6 hours PRN        02/29/24 1126    promethazine  (PHENERGAN ) 12.5 MG tablet  Every 6 hours PRN         02/29/24 1126             Note:  This document was prepared using Dragon voice recognition software and may include unintentional dictation errors.   Levander Slate, MD 02/29/24 1126

## 2024-02-29 NOTE — Discharge Instructions (Addendum)
 Your testing today was fortunately reassuring. I have sent a prescription for 2 nausea medications to your pharmacy that you can take as needed.  The Phenergan  can make you drowsy, do not drive operate machinery when taking this.  Follow-up with your PCP for further evaluation. Return to the ER for new or worsening symptoms.  Take your antibiotics as directed for your ear infection.

## 2024-02-29 NOTE — ED Notes (Signed)
 See triage note  Presents with some n/v  States this started yesterday  Denies any pain  Just unable to keep food down

## 2024-02-29 NOTE — ED Triage Notes (Signed)
 Patient to ED via POV for vomiting. Ongoing since yesterday. States she is only able to keep down ice.

## 2024-03-01 ENCOUNTER — Encounter: Payer: Self-pay | Admitting: Emergency Medicine

## 2024-03-01 ENCOUNTER — Other Ambulatory Visit: Payer: Self-pay

## 2024-03-01 ENCOUNTER — Inpatient Hospital Stay
Admission: EM | Admit: 2024-03-01 | Discharge: 2024-03-05 | DRG: 392 | Disposition: A | Discharge status: Home / Self Care | Attending: Internal Medicine | Admitting: Internal Medicine

## 2024-03-01 DIAGNOSIS — K2100 Gastro-esophageal reflux disease with esophagitis, without bleeding: Secondary | ICD-10-CM | POA: Diagnosis not present

## 2024-03-01 DIAGNOSIS — Z8249 Family history of ischemic heart disease and other diseases of the circulatory system: Secondary | ICD-10-CM

## 2024-03-01 DIAGNOSIS — H6691 Otitis media, unspecified, right ear: Secondary | ICD-10-CM | POA: Diagnosis present

## 2024-03-01 DIAGNOSIS — I10 Essential (primary) hypertension: Secondary | ICD-10-CM | POA: Diagnosis not present

## 2024-03-01 DIAGNOSIS — Z79899 Other long term (current) drug therapy: Secondary | ICD-10-CM

## 2024-03-01 DIAGNOSIS — E876 Hypokalemia: Secondary | ICD-10-CM | POA: Diagnosis present

## 2024-03-01 DIAGNOSIS — R112 Nausea with vomiting, unspecified: Secondary | ICD-10-CM | POA: Diagnosis not present

## 2024-03-01 DIAGNOSIS — J4520 Mild intermittent asthma, uncomplicated: Secondary | ICD-10-CM | POA: Diagnosis not present

## 2024-03-01 DIAGNOSIS — E66813 Obesity, class 3: Secondary | ICD-10-CM | POA: Diagnosis not present

## 2024-03-01 DIAGNOSIS — Z87891 Personal history of nicotine dependence: Secondary | ICD-10-CM

## 2024-03-01 DIAGNOSIS — J45909 Unspecified asthma, uncomplicated: Secondary | ICD-10-CM | POA: Diagnosis present

## 2024-03-01 DIAGNOSIS — F1290 Cannabis use, unspecified, uncomplicated: Secondary | ICD-10-CM | POA: Diagnosis present

## 2024-03-01 DIAGNOSIS — Z6841 Body Mass Index (BMI) 40.0 and over, adult: Secondary | ICD-10-CM

## 2024-03-01 DIAGNOSIS — K219 Gastro-esophageal reflux disease without esophagitis: Secondary | ICD-10-CM | POA: Diagnosis present

## 2024-03-01 DIAGNOSIS — Z91013 Allergy to seafood: Secondary | ICD-10-CM

## 2024-03-01 DIAGNOSIS — Z833 Family history of diabetes mellitus: Secondary | ICD-10-CM

## 2024-03-01 HISTORY — DX: Essential (primary) hypertension: I10

## 2024-03-01 HISTORY — DX: Gastro-esophageal reflux disease without esophagitis: K21.9

## 2024-03-01 LAB — COMPREHENSIVE METABOLIC PANEL WITH GFR
ALT: 20 U/L (ref 0–44)
AST: 24 U/L (ref 15–41)
Albumin: 3.8 g/dL (ref 3.5–5.0)
Alkaline Phosphatase: 59 U/L (ref 38–126)
Anion gap: 11 (ref 5–15)
BUN: 13 mg/dL (ref 6–20)
CO2: 22 mmol/L (ref 22–32)
Calcium: 9.1 mg/dL (ref 8.9–10.3)
Chloride: 104 mmol/L (ref 98–111)
Creatinine, Ser: 0.8 mg/dL (ref 0.44–1.00)
GFR, Estimated: >60 mL/min (ref >60)
Glucose, Bld: 137 mg/dL — ABNORMAL HIGH (ref 70–99)
Potassium: 3.2 mmol/L — ABNORMAL LOW (ref 3.5–5.1)
Sodium: 137 mmol/L (ref 135–145)
Total Bilirubin: 1.1 mg/dL (ref 0.0–1.2)
Total Protein: 7.7 g/dL (ref 6.5–8.1)

## 2024-03-01 LAB — CBC
HCT: 37.5 % (ref 36.0–46.0)
Hemoglobin: 12.6 g/dL (ref 12.0–15.0)
MCH: 29.9 pg (ref 26.0–34.0)
MCHC: 33.6 g/dL (ref 30.0–36.0)
MCV: 88.9 fL (ref 80.0–100.0)
Platelets: 400 10*3/uL (ref 150–400)
RBC: 4.22 MIL/uL (ref 3.87–5.11)
RDW: 13.2 % (ref 11.5–15.5)
WBC: 16.6 10*3/uL — ABNORMAL HIGH (ref 4.0–10.5)
nRBC: 0 % (ref 0.0–0.2)

## 2024-03-01 LAB — MAGNESIUM: Magnesium: 2.3 mg/dL (ref 1.7–2.4)

## 2024-03-01 LAB — LIPASE, BLOOD: Lipase: 27 U/L (ref 11–51)

## 2024-03-01 LAB — PHOSPHORUS: Phosphorus: 1.5 mg/dL — ABNORMAL LOW (ref 2.5–4.6)

## 2024-03-01 MED ORDER — ONDANSETRON HCL 4 MG/2ML IJ SOLN
4.0000 mg | Freq: Four times a day (QID) | INTRAMUSCULAR | Status: DC | PRN
  Administered 2024-03-02: 4 mg via INTRAVENOUS
  Filled 2024-03-01: qty 2

## 2024-03-01 MED ORDER — DICYCLOMINE HCL 10 MG PO CAPS
10.0000 mg | ORAL_CAPSULE | Freq: Four times a day (QID) | ORAL | Status: DC | PRN
  Administered 2024-03-03: 10 mg via ORAL
  Filled 2024-03-01: qty 1

## 2024-03-01 MED ORDER — ALBUTEROL SULFATE (2.5 MG/3ML) 0.083% IN NEBU: 2.5000 mg | INHALATION_SOLUTION | RESPIRATORY_TRACT | Status: DC | PRN

## 2024-03-01 MED ORDER — DM-GUAIFENESIN ER 30-600 MG PO TB12: 1.0000 | ORAL_TABLET | Freq: Two times a day (BID) | ORAL | Status: DC | PRN

## 2024-03-01 MED ORDER — POTASSIUM CHLORIDE CRYS ER 20 MEQ PO TBCR
40.0000 meq | EXTENDED_RELEASE_TABLET | Freq: Once | ORAL | Status: DC
  Filled 2024-03-01: qty 2

## 2024-03-01 MED ORDER — ONDANSETRON HCL 4 MG/2ML IJ SOLN
4.0000 mg | Freq: Once | INTRAMUSCULAR | Status: AC
  Administered 2024-03-01: 4 mg via INTRAVENOUS
  Filled 2024-03-01: qty 2

## 2024-03-01 MED ORDER — LORAZEPAM 2 MG/ML IJ SOLN
1.0000 mg | Freq: Once | INTRAMUSCULAR | Status: AC
  Administered 2024-03-01: 1 mg via INTRAVENOUS
  Filled 2024-03-01: qty 1

## 2024-03-01 MED ORDER — SODIUM CHLORIDE 0.9 % IV SOLN
12.5000 mg | Freq: Once | INTRAVENOUS | Status: AC
  Administered 2024-03-01: 12.5 mg via INTRAVENOUS
  Filled 2024-03-01: qty 12.5

## 2024-03-01 MED ORDER — ACETAMINOPHEN 325 MG PO TABS
650.0000 mg | ORAL_TABLET | Freq: Four times a day (QID) | ORAL | Status: DC | PRN
  Administered 2024-03-03: 650 mg via ORAL
  Filled 2024-03-01: qty 2

## 2024-03-01 MED ORDER — DROSPIRENONE 4 MG PO TABS: 1.0000 | ORAL_TABLET | Freq: Every day | ORAL | Status: DC

## 2024-03-01 MED ORDER — SODIUM CHLORIDE 0.9 % IV SOLN: 25.0000 mg | Freq: Four times a day (QID) | INTRAVENOUS | Status: DC | PRN

## 2024-03-01 MED ORDER — HYDRALAZINE HCL 20 MG/ML IJ SOLN
5.0000 mg | INTRAMUSCULAR | Status: DC | PRN
  Administered 2024-03-01 – 2024-03-02 (×2): 5 mg via INTRAVENOUS
  Filled 2024-03-01 (×2): qty 1

## 2024-03-01 MED ORDER — SODIUM CHLORIDE 0.9 % IV SOLN: INTRAVENOUS | Status: DC

## 2024-03-01 MED ORDER — FAMOTIDINE IN NACL 20-0.9 MG/50ML-% IV SOLN
20.0000 mg | Freq: Once | INTRAVENOUS | Status: AC
  Administered 2024-03-01: 20 mg via INTRAVENOUS
  Filled 2024-03-01: qty 50

## 2024-03-01 MED ORDER — AMOXICILLIN-POT CLAVULANATE 875-125 MG PO TABS
1.0000 | ORAL_TABLET | Freq: Two times a day (BID) | ORAL | Status: DC
  Administered 2024-03-02 – 2024-03-05 (×6): 1 via ORAL
  Filled 2024-03-01 (×8): qty 1

## 2024-03-01 MED ORDER — SODIUM CHLORIDE 0.9 % IV BOLUS
1000.0000 mL | Freq: Once | INTRAVENOUS | Status: AC
  Administered 2024-03-01: 1000 mL via INTRAVENOUS

## 2024-03-01 MED ORDER — ENOXAPARIN SODIUM 60 MG/0.6ML IJ SOSY
0.5000 mg/kg | PREFILLED_SYRINGE | INTRAMUSCULAR | Status: DC
  Administered 2024-03-01: 52.5 mg via SUBCUTANEOUS
  Filled 2024-03-01: qty 0.6

## 2024-03-01 MED ORDER — PANTOPRAZOLE SODIUM 40 MG IV SOLR
40.0000 mg | Freq: Two times a day (BID) | INTRAVENOUS | Status: DC
  Administered 2024-03-01 – 2024-03-02 (×2): 40 mg via INTRAVENOUS
  Filled 2024-03-01 (×2): qty 10

## 2024-03-01 MED ORDER — POTASSIUM PHOSPHATES 15 MMOLE/5ML IV SOLN
30.0000 mmol | Freq: Once | INTRAVENOUS | Status: AC
  Administered 2024-03-02: 30 mmol via INTRAVENOUS
  Filled 2024-03-01 (×2): qty 10

## 2024-03-01 NOTE — ED Provider Notes (Signed)
 [facility] Provider Note    Event Date/Time   First MD Initiated Contact with Patient 03/01/24 1242     (approximate)   History   Emesis   HPI  Brandi [name] is a 28 y.o. female with a history of asthma who presents with nausea and vomiting for the last 2 days.  She was seen in the ED yesterday for the same and felt better when she left but was unable to fill her prescription and the nausea has returned today.  She last vomited about an hour ago.  She denies any diarrhea.  She has no abdominal pain.  She states that the nausea has not worsened at all since yesterday and is about the same.  I reviewed the past medical records.  The patient was seen in the ED for nausea and vomiting yesterday and had a reassuring lab workup at that time.  Previously her most recent outpatient encounter was with OB/GYN on 12/5 last year for birth control.   Physical Exam   Triage Vital Signs: ED Triage Vitals  Encounter Vitals Group     BP 03/01/24 1236 131/88     Girls Systolic BP Percentile --      Girls Diastolic BP Percentile --      Boys Systolic BP Percentile --      Boys Diastolic BP Percentile --      Pulse Rate 03/01/24 1236 (!) 56     Resp 03/01/24 1236 17     Temp 03/01/24 1236 98.4 F (36.9 C)     Temp Source 03/01/24 1236 Oral     SpO2 03/01/24 1236 98 %     Weight 03/01/24 1235 233 lb 11 oz (106 kg)     Height 03/01/24 1235 5' 3 (1.6 m)     Head Circumference --      Peak Flow --      Pain Score 03/01/24 1233 9     Pain Loc --      Pain Education --      Exclude from Growth Chart --     Most recent vital signs: Vitals:   03/01/24 1236  BP: 131/88  Pulse: (!) 56  Resp: 17  Temp: 98.4 F (36.9 C)  SpO2: 98%     General: Alert, uncomfortable appearing, no distress.  CV:  Good peripheral perfusion.  Resp:  Normal effort.  Abd:  Soft and nontender.  No distention.  Other:  Dry mucous membranes.   ED Results / Procedures / Treatments    Labs (all labs ordered are listed, but only abnormal results are displayed) Labs Reviewed  COMPREHENSIVE METABOLIC PANEL WITH GFR - Abnormal; Notable for the following components:      Result Value   Potassium 3.2 (*)    Glucose, Bld 137 (*)    All other components within normal limits  CBC - Abnormal; Notable for the following components:   WBC 16.6 (*)    All other components within normal limits  LIPASE, BLOOD     EKG    RADIOLOGY    PROCEDURES:  Critical Care performed: No  Procedures   MEDICATIONS ORDERED IN ED: Medications  sodium chloride  0.9 % bolus 1,000 mL (1,000 mLs Intravenous New Bag/Given 03/01/24 1329)  promethazine  (PHENERGAN ) 12.5 mg in sodium chloride  0.9 % 50 mL IVPB (0 mg Intravenous Stopped 03/01/24 1359)     IMPRESSION / MDM / ASSESSMENT AND PLAN / ED COURSE  I reviewed the triage vital signs  and the nursing notes.  28 year old female with PMH as noted above presents with persistent nausea and vomiting over the last couple of days.  She has had similar episodes previously.  She was unable to fill her outpatient prescription.  On exam she is uncomfortable appearing but in no distress.  The abdomen is soft and nontender.  Vital signs are normal.  Differential diagnosis includes, but is not limited to, gastroenteritis, foodborne illness, cyclical vomiting, cannabinoid hyperemesis, gastroparesis, pancreatitis.  We will obtain lab workup, give antiemetics, fluids, and reassess.  Patient's presentation is most consistent with acute complicated illness / injury requiring diagnostic workup.  ----------------------------------------- 3:34 PM on 03/01/2024 -----------------------------------------  Labs are unremarkable.  WBC count has decreased since yesterday.  CMP shows no acute abnormalities.  Lipase is normal.  There is no indication for imaging.    On reassessment the patient reports improved nausea although still feeling somewhat nauseous.  She  will receive the rest of the fluids and we will do a p.o. challenge.  I anticipate she will be able to go home.  I have signed her out to the oncoming ED physician Dr. Dorothyann.   FINAL CLINICAL IMPRESSION(S) / ED DIAGNOSES   Final diagnoses:  Nausea and vomiting, unspecified vomiting type     Rx / DC Orders   ED Discharge Orders     None        Note:  This document was prepared using Dragon voice recognition software and may include unintentional dictation errors.    Jacolyn Pae, MD 03/01/24 1535

## 2024-03-01 NOTE — ED Provider Notes (Signed)
-----------------------------------------   7:22 PM on 03/01/2024 ----------------------------------------- Patient continues to be nauseated, attempted to drink water and then vomited a small amount.  Patient's workup shows mild leukocytosis no other significant findings.  Normal anion gap with just slight hypokalemia at 3.2.  Patient has received Zofran , Phenergan , Ativan  remains nauseated.  This is the patient's second visit in 2 days.  Patient does not believe she can go home and she has been in the emergency department for 7 hours at this time.  Will discuss with hospitalist for admission for intractable nausea vomiting.   Dorothyann Drivers, MD 03/01/24 [phone]

## 2024-03-01 NOTE — H&P (Signed)
 History and Physical    Brandi [name] FMW:[id number] DOB: December 23, 1995 DOA: 03/01/2024  Referring MD/NP/PA:   PCP: Patient, No Pcp Per   Patient coming from:  The patient is coming from home.     Chief Complaint: Intractable nausea, vomiting  HPI: Brandi [name] is a 28 y.o. female with medical history significant of marijuana use, hypertension, asthma, GERD, morbid obesity, who presents with intractable nausea and vomiting.  Patient has intractable nausea and vomiting for more than 3 days.  Patient was seen in the ED on 7/1; gave supportive care and felt better when she left.  She was given prescription for nausea medication including Zofran  and Phenergan .  She was given prescription for Augmentin  for right ear pain due to possible ear infection on 7/1, patient has not started taking this antibiotic yet.  No fever or chills.  No ear drainage.  No hearing loss.  Patient states that  She could not fill her prescription since the pharmacy was closed.  She continues to have severe intractable nausea, vomiting, cannot keep anything down.  She states that she has more than 10 times of nonbilious vomiting.  At the very beginning, she noticed streaks of blood in the vomitus, which has resolved.  There is no blood in today's vomitus.  She does not have abdominal pain.  She had loose stool bowel movement at the very beginning, which has resolved.  Currently no active diarrhea. Patient does not have chest pain, cough and SOB.  No symptoms of UTI.    Data reviewed independently and ED Course: pt was found to have WBC 16.6, GFR> 60, potassium 3.2, hemoglobin stable 12.6 (12.7 on 02/29/2024), negative pregnancy test on 7/1.  Temperature normal, blood pressure 132/82, heart rate 105 --> 61, RR 18, oxygen saturation 99% on room air.  Patient is placed in MedSurg bed for observation.   EKG: I have personally reviewed.  Sinus rhythm, QTc 439, early R wave progression, irregular sinus.   Review of Systems:    General: no fevers, chills, no body weight gain, has poor appetite. HEENT: no blurry vision, hearing changes or sore throat. Has right ear pain Respiratory: no dyspnea, coughing, wheezing CV: no chest pain, no palpitations GI: has nausea, vomiting, no abdominal pain, diarrhea, constipation GU: no dysuria, burning on urination, increased urinary frequency, hematuria  Ext: no leg edema Neuro: no unilateral weakness, numbness, or tingling, no vision change or hearing loss Skin: no rash, no skin tear. MSK: No muscle spasm, no deformity, no limitation of range of movement in spin Heme: No easy bruising.  Travel history: No recent long distant travel.   Allergy:  Allergies  Allergen Reactions   Shellfish Allergy Anaphylaxis    Past Medical History:  Diagnosis Date   Asthma    GERD (gastroesophageal reflux disease)    HTN (hypertension)     Past Surgical History:  Procedure Laterality Date   dental procedure      Social History:  reports that she has quit smoking. Her smoking use included cigarettes. She has never used smokeless tobacco. She reports current alcohol use. She reports current drug use. Drug: Marijuana.  Family History:  Family History  Problem Relation Age of Onset   Hypertension Maternal Grandmother    Diabetes Maternal Grandmother      Prior to Admission medications   Medication Sig Start Date End Date Taking? Authorizing Provider  acetaminophen  (TYLENOL ) 325 MG tablet Take 650 mg by mouth every 6 (six) hours as needed for mild pain.  Patient not taking: Reported on 04/06/2022    [provider]  amLODipine  (NORVASC ) 5 MG tablet Take 1 tablet (5 mg total) by mouth daily. 05/26/23 08/24/23  Suzanne Kirsch, MD  amoxicillin -clavulanate (AUGMENTIN ) 875-125 MG tablet Take 1 tablet by mouth 2 (two) times daily for 7 days. 02/29/24 03/07/24  Levander Slate, MD  dicyclomine  (BENTYL ) 10 MG capsule Take 1 capsule (10 mg total) by mouth every 6 (six) hours as needed for  up to 7 days for spasms (abdominal pain). 12/04/19 12/11/19  Viviann Pastor, MD  doxycycline  (DORYX ) 100 MG EC tablet Take 1 tablet (100 mg total) by mouth 2 (two) times daily. Patient not taking: Reported on 04/06/2022 04/23/21   Malinda, Paul F, MD  Drospirenone  (SLYND ) 4 MG TABS Take 1 tablet (4 mg total) by mouth daily. 08/05/23   Izell Harari, MD  famotidine  (PEPCID ) 20 MG tablet Take 1 tablet (20 mg total) by mouth daily. Patient not taking: Reported on 04/06/2022 04/05/22 04/05/23  Floy Roberts, MD  naproxen  (NAPROSYN ) 500 MG tablet Take 1 tablet (500 mg total) by mouth 2 (two) times daily with a meal. Patient not taking: Reported on 04/06/2022 12/02/20   Claudene Tanda POUR, PA-C  ondansetron  (ZOFRAN ) 4 MG tablet Take 1 tablet (4 mg total) by mouth every 6 (six) hours as needed for up to 7 days for nausea or vomiting. 02/29/24 03/07/24  Levander Slate, MD  ondansetron  (ZOFRAN -ODT) 4 MG disintegrating tablet Take 1 tablet (4 mg total) by mouth every 6 (six) hours as needed for nausea. Patient not taking: Reported on 08/05/2023 04/06/22   Eldonna Suzen Octave, MD  promethazine  (PHENERGAN ) 12.5 MG tablet Take 1 tablet (12.5 mg total) by mouth every 6 (six) hours as needed for up to 5 days for nausea or vomiting. 02/29/24 03/05/24  Levander Slate, MD  sucralfate  (CARAFATE ) 1 g tablet Take 1 tablet (1 g total) by mouth 4 (four) times daily -  with meals and at bedtime for 10 days. 10/23/18 11/02/18  Henderly, Britni A, PA-C  Triprolidine-Pseudoephedrine  (ANTIHISTAMINE PO) Take 3 tablets by mouth daily as needed (allergies). Patient not taking: Reported on 04/06/2022    [provider]    Physical Exam: Vitals:   03/01/24 1236 03/01/24 1854 03/01/24 1925 03/01/24 2045  BP: 131/88 132/82 (!) 186/117 (!) 178/111  Pulse: (!) 56 61 (!) 55 79  Resp: 17 18  18   Temp: 98.4 F (36.9 C) 98.1 F (36.7 C)  98.4 F (36.9 C)  TempSrc: Oral   Oral  SpO2: 98% 100% 100% 100%  Weight:      Height:       General: Not in  acute distress.  Dry mucous membrane HEENT:       Eyes: PERRL, EOMI, no jaundice       ENT: No discharge from the ears and nose, no pharynx injection, no tonsillar enlargement.        Neck: No JVD, no bruit, no mass felt. Heme: No neck lymph node enlargement. Cardiac: S1/S2, RRR, No murmurs, No gallops or rubs. Respiratory: No rales, wheezing, rhonchi or rubs. GI: Soft, nondistended, nontender, no rebound pain, no organomegaly, BS present. GU: No hematuria Ext: No pitting leg edema bilaterally. 1+DP/PT pulse bilaterally. Musculoskeletal: No joint deformities, No joint redness or warmth, no limitation of ROM in spin. Skin: No rashes.  Neuro: Alert, oriented X3, cranial nerves II-XII grossly intact, moves all extremities normally.  Psych: Patient is not psychotic, no suicidal or hemocidal ideation.  Labs on  Admission: I have personally reviewed following labs and imaging studies  CBC: Recent Labs  Lab 02/29/24 0827 03/01/24 1237  WBC 19.1* 16.6*  HGB 12.7 12.6  HCT 38.3 37.5  MCV 89.7 88.9  PLT 430* 400   Basic Metabolic Panel: Recent Labs  Lab 02/29/24 0827 03/01/24 1237  NA 141 137  K 3.3* 3.2*  CL 105 104  CO2 23 22  GLUCOSE 136* 137*  BUN 12 13  CREATININE 0.77 0.80  CALCIUM 9.5 9.1  MG  --  2.3  PHOS  --  1.5*   GFR: Estimated Creatinine Clearance: 122 mL/min (by C-G formula based on SCr of 0.8 mg/dL). Liver Function Tests: Recent Labs  Lab 02/29/24 0827 03/01/24 1237  AST 21 24  ALT 18 20  ALKPHOS 63 59  BILITOT 0.9 1.1  PROT 7.9 7.7  ALBUMIN 3.9 3.8   Recent Labs  Lab 02/29/24 0827 03/01/24 1237  LIPASE 27 27   No results for input(s): AMMONIA in the last 168 hours. Coagulation Profile: No results for input(s): INR, PROTIME in the last 168 hours. Cardiac Enzymes: No results for input(s): CKTOTAL, CKMB, CKMBINDEX, TROPONINI in the last 168 hours. BNP (last 3 results) No results for input(s): PROBNP in the last 8760  hours. HbA1C: No results for input(s): HGBA1C in the last 72 hours. CBG: No results for input(s): GLUCAP in the last 168 hours. Lipid Profile: No results for input(s): CHOL, HDL, LDLCALC, TRIG, CHOLHDL, LDLDIRECT in the last 72 hours. Thyroid Function Tests: No results for input(s): TSH, T4TOTAL, FREET4, T3FREE, THYROIDAB in the last 72 hours. Anemia Panel: No results for input(s): VITAMINB12, FOLATE, FERRITIN, TIBC, IRON, RETICCTPCT in the last 72 hours. Urine analysis:    Component Value Date/Time   COLORURINE YELLOW (A) 12/28/2022 1540   APPEARANCEUR HAZY (A) 12/28/2022 1540   LABSPEC 1.012 12/28/2022 1540   PHURINE 7.0 12/28/2022 1540   GLUCOSEU NEGATIVE 12/28/2022 1540   HGBUR MODERATE (A) 12/28/2022 1540   BILIRUBINUR NEGATIVE 12/28/2022 1540   KETONESUR NEGATIVE 12/28/2022 1540   PROTEINUR NEGATIVE 12/28/2022 1540   UROBILINOGEN 1.0 05/20/2015 1230   NITRITE NEGATIVE 12/28/2022 1540   LEUKOCYTESUR LARGE (A) 12/28/2022 1540   Sepsis Labs: @LABRCNTIP (procalcitonin:4,lacticidven:4) )No results found for this or any previous visit (from the past 240 hours).   Radiological Exams on Admission:   Assessment/Plan Principal Problem:   Intractable nausea and vomiting Active Problems:   Marijuana use   HTN (hypertension)   GERD (gastroesophageal reflux disease)   Asthma   Hypokalemia   Hypophosphatemia   Right middle ear infection   Obesity, Class III, BMI 40-49.9 (morbid obesity)   Assessment and Plan:  Intractable nausea and vomiting: Potential differential diagnosis includes viral gastroenteritis and cannabinoid hyperemesis.  No abdominal pain.  Initially had streaks of blood in the vomitus which has resolved.  Hemoglobin stable, 12.6.  No active diarrhea currently.  -Place in MedSurg bed for observation - As needed Zofran  (received Phenergan  in ED) - As needed Bentyl  - IV fluid: 1 L normal saline, Naima 125 cc/h - IV  Protonix  40 mg twice daily (patient received 1 dose of 20 mg Pepcid  in ED)  Marijuana use: I explained to the patient that using marijuana can cause her symptoms, recommend quit marijuana use. -UDS  HTN (hypertension): Blood pressure 132/82.  Patient is not taking her amlodipine  currently. -IV hydralazine as needed  GERD (gastroesophageal reflux disease) -On Protonix   Asthma: Stable -As needed albuterol and Mucinex  Hypokalemia and hypophosphatemia: Potassium  3.2, phosphorus 1.5.  Magnesium  normal 2.3. - Repleted potassium and phosphorus  Right middle ear infection -Augmentin   Obesity, Class III, BMI 40-49.9 (morbid obesity):  with body weight 106 Kg and BMI 41.4  kg/m2.  - Encourage losing weight - Exercise and healthy diet        DVT ppx:  SQ Lovenox   Code Status: Full code   Family Communication:     not done, no family member is at bed side.      Disposition Plan:  Anticipate discharge back to previous environment  Consults called:  none  Admission status and Level of care: Med-Surg:    for obs     Dispo: The patient is from: Home              Anticipated d/c is to: Home              Anticipated d/c date is: 1 day              Patient currently is not medically stable to d/c.    Severity of Illness:  The appropriate patient status for this patient is OBSERVATION. Observation status is judged to be reasonable and necessary in order to provide the required intensity of service to ensure the patient's safety. The patient's presenting symptoms, physical exam findings, and initial radiographic and laboratory data in the context of their medical condition is felt to place them at decreased risk for further clinical deterioration. Furthermore, it is anticipated that the patient will be medically stable for discharge from the hospital within 2 midnights of admission.        Date of Service 03/01/2024    Caleb Exon [facility]   If 7PM-7AM, please  contact night-coverage [email or web address] 03/01/2024, 9:05 PM

## 2024-03-01 NOTE — ED Triage Notes (Signed)
 Patient to ED via POV for N/V. Seen for same yesterday and sent home with prescription for nausea medicine. States pharmacy is closed and unable to pick up prescription.

## 2024-03-01 NOTE — ED Notes (Signed)
 Pt reporting to ED d/t 3 days of N/V. Pt states that she still feels nauseous and last emesis occurrence was about an hour ago. Pt ABCs intact. RR even and unlabored. Pt in NAD. Bed in lowest locked position. Call bell in reach. Denies needs at this time.   Past Medical History:  Diagnosis Date   Asthma

## 2024-03-01 NOTE — Progress Notes (Signed)
 PHARMACIST - PHYSICIAN COMMUNICATION  CONCERNING:  Enoxaparin  (Lovenox ) for DVT Prophylaxis    RECOMMENDATION: Patient was prescribed enoxaprin 40mg  q24 hours for VTE prophylaxis.   Filed Weights   03/01/24 1235  Weight: 106 kg (233 lb 11 oz)    Body mass index is 41.4 kg/m.  Estimated Creatinine Clearance: 122 mL/min (by C-G formula based on SCr of 0.8 mg/dL).   Based on [facility] policy patient is candidate for enoxaparin  0.5mg /kg TBW SQ every 24 hours based on BMI being >30.  DESCRIPTION: Pharmacy has adjusted enoxaparin  dose per [facility] policy.  Patient is now receiving enoxaparin  52.5 mg every 24 hours    Damien Napoleon, PharmD Clinical Pharmacist  03/01/2024 7:49 PM

## 2024-03-02 DIAGNOSIS — Z6841 Body Mass Index (BMI) 40.0 and over, adult: Secondary | ICD-10-CM | POA: Diagnosis not present

## 2024-03-02 DIAGNOSIS — Z833 Family history of diabetes mellitus: Secondary | ICD-10-CM | POA: Diagnosis not present

## 2024-03-02 DIAGNOSIS — Z8249 Family history of ischemic heart disease and other diseases of the circulatory system: Secondary | ICD-10-CM | POA: Diagnosis not present

## 2024-03-02 DIAGNOSIS — H6691 Otitis media, unspecified, right ear: Secondary | ICD-10-CM | POA: Diagnosis not present

## 2024-03-02 DIAGNOSIS — K219 Gastro-esophageal reflux disease without esophagitis: Secondary | ICD-10-CM | POA: Diagnosis not present

## 2024-03-02 DIAGNOSIS — E876 Hypokalemia: Secondary | ICD-10-CM | POA: Diagnosis not present

## 2024-03-02 DIAGNOSIS — I10 Essential (primary) hypertension: Secondary | ICD-10-CM | POA: Diagnosis not present

## 2024-03-02 DIAGNOSIS — Z79899 Other long term (current) drug therapy: Secondary | ICD-10-CM | POA: Diagnosis not present

## 2024-03-02 DIAGNOSIS — Z87891 Personal history of nicotine dependence: Secondary | ICD-10-CM | POA: Diagnosis not present

## 2024-03-02 DIAGNOSIS — F1290 Cannabis use, unspecified, uncomplicated: Secondary | ICD-10-CM | POA: Diagnosis not present

## 2024-03-02 DIAGNOSIS — Z91013 Allergy to seafood: Secondary | ICD-10-CM | POA: Diagnosis not present

## 2024-03-02 DIAGNOSIS — E66813 Obesity, class 3: Secondary | ICD-10-CM | POA: Diagnosis not present

## 2024-03-02 DIAGNOSIS — J45909 Unspecified asthma, uncomplicated: Secondary | ICD-10-CM | POA: Diagnosis not present

## 2024-03-02 DIAGNOSIS — R112 Nausea with vomiting, unspecified: Secondary | ICD-10-CM | POA: Diagnosis not present

## 2024-03-02 LAB — BASIC METABOLIC PANEL WITH GFR
Anion gap: 13 (ref 5–15)
Anion gap: 12 (ref 5–15)
BUN: 11 mg/dL (ref 6–20)
BUN: 7 mg/dL (ref 6–20)
CO2: 22 mmol/L (ref 22–32)
CO2: 22 mmol/L (ref 22–32)
Calcium: 8.7 mg/dL — ABNORMAL LOW (ref 8.9–10.3)
Calcium: 8.8 mg/dL — ABNORMAL LOW (ref 8.9–10.3)
Chloride: 103 mmol/L (ref 98–111)
Chloride: 104 mmol/L (ref 98–111)
Creatinine, Ser: 0.71 mg/dL (ref 0.44–1.00)
Creatinine, Ser: 0.73 mg/dL (ref 0.44–1.00)
GFR, Estimated: >60 mL/min (ref >60)
GFR, Estimated: >60 mL/min
Glucose, Bld: 103 mg/dL — ABNORMAL HIGH (ref 70–99)
Glucose, Bld: 107 mg/dL — ABNORMAL HIGH (ref 70–99)
Potassium: 2.8 mmol/L — ABNORMAL LOW (ref 3.5–5.1)
Potassium: 3.4 mmol/L — ABNORMAL LOW (ref 3.5–5.1)
Sodium: 138 mmol/L (ref 135–145)
Sodium: 138 mmol/L (ref 135–145)

## 2024-03-02 LAB — CBC
HCT: 34.5 % — ABNORMAL LOW (ref 36.0–46.0)
Hemoglobin: 11.5 g/dL — ABNORMAL LOW (ref 12.0–15.0)
MCH: 29.6 pg (ref 26.0–34.0)
MCHC: 33.3 g/dL (ref 30.0–36.0)
MCV: 88.7 fL (ref 80.0–100.0)
Platelets: 366 10*3/uL (ref 150–400)
RBC: 3.89 MIL/uL (ref 3.87–5.11)
RDW: 13.1 % (ref 11.5–15.5)
WBC: 18.5 10*3/uL — ABNORMAL HIGH (ref 4.0–10.5)
nRBC: 0.1 % (ref 0.0–0.2)

## 2024-03-02 LAB — URINE DRUG SCREEN, QUALITATIVE (ARMC ONLY)
Amphetamines, Ur Screen: NOT DETECTED
Barbiturates, Ur Screen: NOT DETECTED
Benzodiazepine, Ur Scrn: POSITIVE — AB
Cannabinoid 50 Ng, Ur ~~LOC~~: POSITIVE — AB
Cocaine Metabolite,Ur ~~LOC~~: NOT DETECTED
MDMA (Ecstasy)Ur Screen: NOT DETECTED
Methadone Scn, Ur: NOT DETECTED
Opiate, Ur Screen: NOT DETECTED
Phencyclidine (PCP) Ur S: NOT DETECTED
Tricyclic, Ur Screen: NOT DETECTED

## 2024-03-02 LAB — HIV ANTIBODY (ROUTINE TESTING W REFLEX): HIV Screen 4th Generation wRfx: NONREACTIVE

## 2024-03-02 LAB — MAGNESIUM: Magnesium: 2 mg/dL (ref 1.7–2.4)

## 2024-03-02 LAB — PHOSPHORUS: Phosphorus: 3.9 mg/dL (ref 2.5–4.6)

## 2024-03-02 MED ORDER — POTASSIUM PHOSPHATES 15 MMOLE/5ML IV SOLN
30.0000 mmol | Freq: Once | INTRAVENOUS | Status: AC
  Administered 2024-03-02: 30 mmol via INTRAVENOUS
  Filled 2024-03-02: qty 10

## 2024-03-02 MED ORDER — SODIUM CHLORIDE 0.9 % IV SOLN
12.5000 mg | Freq: Four times a day (QID) | INTRAVENOUS | Status: DC | PRN
  Administered 2024-03-02 – 2024-03-04 (×5): 12.5 mg via INTRAVENOUS
  Filled 2024-03-02: qty 0.5
  Filled 2024-03-02 (×5): qty 12.5

## 2024-03-02 MED ORDER — HYDRALAZINE HCL 20 MG/ML IJ SOLN
10.0000 mg | Freq: Once | INTRAMUSCULAR | Status: AC
  Administered 2024-03-02: 10 mg via INTRAVENOUS
  Filled 2024-03-02: qty 1

## 2024-03-02 MED ORDER — PANTOPRAZOLE SODIUM 40 MG IV SOLR
40.0000 mg | INTRAVENOUS | Status: DC
  Administered 2024-03-03 – 2024-03-05 (×3): 40 mg via INTRAVENOUS
  Filled 2024-03-02 (×3): qty 10

## 2024-03-02 MED ORDER — ONDANSETRON HCL 4 MG/2ML IJ SOLN
4.0000 mg | Freq: Four times a day (QID) | INTRAMUSCULAR | Status: DC | PRN
  Administered 2024-03-02 – 2024-03-05 (×5): 4 mg via INTRAVENOUS
  Filled 2024-03-02 (×6): qty 2

## 2024-03-02 MED ORDER — POTASSIUM CHLORIDE IN NACL 40-0.9 MEQ/L-% IV SOLN
INTRAVENOUS | Status: AC
  Filled 2024-03-02 (×4): qty 1000

## 2024-03-02 MED ORDER — HYDRALAZINE HCL 20 MG/ML IJ SOLN
5.0000 mg | Freq: Four times a day (QID) | INTRAMUSCULAR | Status: DC | PRN
  Administered 2024-03-02 – 2024-03-03 (×2): 5 mg via INTRAVENOUS
  Filled 2024-03-02 (×2): qty 1

## 2024-03-02 MED ORDER — METOCLOPRAMIDE HCL 5 MG/ML IJ SOLN
10.0000 mg | Freq: Four times a day (QID) | INTRAMUSCULAR | Status: DC
  Administered 2024-03-02: 10 mg via INTRAVENOUS
  Filled 2024-03-02: qty 2

## 2024-03-02 NOTE — Progress Notes (Signed)
 Progress Note   Patient: Brandi [name] FMW:[id number] DOB: [birth year] DOA: 03/01/2024     0 DOS: the patient was seen and examined on 03/02/2024   Brief hospital course:  Brandi [name] is a 28 y.o. female with medical history significant of marijuana use, hypertension, asthma, GERD, morbid obesity, who presents with intractable nausea and vomiting.   Patient has intractable nausea and vomiting for more than 3 days.  Patient was seen in the ED on 7/1; gave supportive care and felt better when she left.  She was given prescription for nausea medication including Zofran  and Phenergan .  She was given prescription for Augmentin  for right ear pain due to possible ear infection on 7/1, patient has not started taking this antibiotic yet.  No fever or chills.  No ear drainage.  No hearing loss.  Patient states that  She could not fill her prescription since the pharmacy was closed.  She continues to have severe intractable nausea, vomiting, cannot keep anything down.  She states that she has more than 10 times of nonbilious vomiting.  At the very beginning, she noticed streaks of blood in the vomitus, which has resolved.  There is no blood in today's vomitus.  She does not have abdominal pain.  She had loose stool bowel movement at the very beginning, which has resolved.  Currently no active diarrhea. Patient does not have chest pain, cough and SOB.  No symptoms of UTI.     Data reviewed independently and ED Course: pt was found to have WBC 16.6, GFR> 60, potassium 3.2, hemoglobin stable 12.6 (12.7 on 02/29/2024), negative pregnancy test on 7/1.  Temperature normal, blood pressure 132/82, heart rate 105 --> 61, RR 18, oxygen saturation 99% on room air.  Patient is placed in MedSurg bed for observation.     EKG: I have personally reviewed.  Sinus rhythm, QTc 439, early R wave progression, irregular sinus.     Assessment and Plan:  Cannabis hyperemesis syndrome Refractory nausea and vomiting Continue symptom  control with antiemetics, IV PPI, IV fluid hydration Urine drug screen is positive for cannabinoids and benzodiazepine     Severe hypokalemia/hypophosphatemia Secondary to GI losses from nausea and vomiting Supplement potassium and phosphorus Check magnesium  levels     HTN (hypertension):  Continue amlodipine  when patient is able to tolerate oral intake -IV hydralazine as needed    GERD (gastroesophageal reflux disease) -On Protonix     Asthma:  Stable -As needed albuterol and Mucinex      Right middle ear infection Continue Augmentin  when patient is able to tolerate oral intake   Obesity, Class III, BMI 40-49.9 (morbid obesity):  with body weight 106 Kg and BMI 41.4  kg/m2.  - Encourage losing weight - Exercise and healthy diet       Subjective: Continues to complain of nausea and vomiting and unable to keep any oral intake down  Physical Exam: Vitals:   03/02/24 0105 03/02/24 0441 03/02/24 0731 03/02/24 0927  BP: 116/69 (!) 142/93 (!) 169/102 (!) 152/76  Pulse: 63 73 89   Resp: 18 18 19    Temp: 98.7 F (37.1 C) 98.6 F (37 C) 99.3 F (37.4 C)   TempSrc:      SpO2:  99% 100%   Weight:      Height:       General: Not in acute distress.  Dry mucous membranes HEENT:       Eyes: PERRL, EOMI, no jaundice       ENT: No discharge  from the ears and nose, no pharynx injection, no tonsillar enlargement.        Neck: No JVD, no bruit, no mass felt. Heme: No neck lymph node enlargement. Cardiac: S1/S2, RRR, No murmurs, No gallops or rubs. Respiratory: No rales, wheezing, rhonchi or rubs. GI: Soft, nondistended, nontender, no rebound pain, no organomegaly, BS present. GU: No hematuria Ext: No pitting leg edema bilaterally. 1+DP/PT pulse bilaterally. Musculoskeletal: No joint deformities, No joint redness or warmth, no limitation of ROM in spin. Skin: No rashes.  Neuro: Alert, oriented X3, cranial nerves II-XII grossly intact, moves all extremities normally.   Psych: Patient is not psychotic, no suicidal or hemocidal ideation.   Data Reviewed: Potassium 2.8, magnesium  2.0 Labs reviewed  Family Communication: Plan of care was discussed with patient at the bedside.  All questions and concerns have been addressed.  She verbalizes understanding agrees and plan.  Disposition: Status is: Inpatient Remains inpatient appropriate because: Needs potassium supplementation and scheduled antiemetics  Planned Discharge Destination: Home    Time spent: 40 minutes  Author: Aimee Somerset, MD 03/02/2024 11:46 AM  For on call review [email or web address].

## 2024-03-02 NOTE — Plan of Care (Signed)
   Problem: Education: Goal: Knowledge of General Education information will improve Description: Including pain rating scale, medication(s)/side effects and non-pharmacologic comfort measures Outcome: Progressing   Problem: Pain Managment: Goal: General experience of comfort will improve and/or be controlled Outcome: Progressing   Problem: Safety: Goal: Ability to remain free from injury will improve Outcome: Progressing

## 2024-03-02 NOTE — ED Notes (Signed)
Inpatient provider at the bedside

## 2024-03-02 NOTE — ED Notes (Signed)
 Pt disconnected from IV medications, VS taken.

## 2024-03-03 DIAGNOSIS — R112 Nausea with vomiting, unspecified: Secondary | ICD-10-CM | POA: Diagnosis not present

## 2024-03-03 MED ORDER — LABETALOL HCL 5 MG/ML IV SOLN
10.0000 mg | Freq: Once | INTRAVENOUS | Status: AC
  Administered 2024-03-03: 10 mg via INTRAVENOUS
  Filled 2024-03-03: qty 4

## 2024-03-03 MED ORDER — HYDRALAZINE HCL 20 MG/ML IJ SOLN
10.0000 mg | Freq: Four times a day (QID) | INTRAMUSCULAR | Status: DC | PRN
  Administered 2024-03-03 – 2024-03-04 (×2): 10 mg via INTRAVENOUS
  Filled 2024-03-03 (×2): qty 1

## 2024-03-03 NOTE — Plan of Care (Signed)

## 2024-03-03 NOTE — Plan of Care (Signed)

## 2024-03-03 NOTE — Progress Notes (Signed)
 Progress Note   Patient: Brandi [name] FMW:[id number] DOB: 1996/03/22 DOA: 03/01/2024     1 DOS: the patient was seen and examined on 03/03/2024   Brief hospital course:  Brandi [name] is a 28 y.o. female with medical history significant of marijuana use, hypertension, asthma, GERD, morbid obesity, who presents with intractable nausea and vomiting.   Patient has intractable nausea and vomiting for more than 3 days.  Patient was seen in the ED on 7/1; gave supportive care and felt better when she left.  She was given prescription for nausea medication including Zofran  and Phenergan .  She was given prescription for Augmentin  for right ear pain due to possible ear infection on 7/1, patient has not started taking this antibiotic yet.  No fever or chills.  No ear drainage.  No hearing loss.  Patient states that  She could not fill her prescription since the pharmacy was closed.  She continues to have severe intractable nausea, vomiting, cannot keep anything down.  She states that she has more than 10 times of nonbilious vomiting.  At the very beginning, she noticed streaks of blood in the vomitus, which has resolved.  There is no blood in today's vomitus.  She does not have abdominal pain.  She had loose stool bowel movement at the very beginning, which has resolved.  Currently no active diarrhea. Patient does not have chest pain, cough and SOB.  No symptoms of UTI.     Data reviewed independently and ED Course: pt was found to have WBC 16.6, GFR> 60, potassium 3.2, hemoglobin stable 12.6 (12.7 on 02/29/2024), negative pregnancy test on 7/1.  Temperature normal, blood pressure 132/82, heart rate 105 --> 61, RR 18, oxygen saturation 99% on room air.  Patient is placed in MedSurg bed for observation.     EKG: I have personally reviewed.  Sinus rhythm, QTc 439, early R wave progression, irregular sinus.     Assessment and Plan:  Cannabis hyperemesis syndrome Refractory nausea and vomiting.  Still unable to  tolerate any oral intake Continue symptom control with antiemetics, IV PPI, IV fluid hydration Urine drug screen is positive for cannabinoids and benzodiazepine       Severe hypokalemia/hypophosphatemia Secondary to GI losses from nausea and vomiting Supplement potassium and phosphorus Check magnesium  levels       HTN (hypertension):  Continue amlodipine  when patient is able to tolerate oral intake -IV hydralazine  as needed     GERD (gastroesophageal reflux disease) -On Protonix      Asthma:  Stable -As needed albuterol  and Mucinex        Right middle ear infection Continue Augmentin  when patient is able to tolerate oral intake   Obesity, Class III, BMI 40-49.9 (morbid obesity):  with body weight 106 Kg and BMI 41.4  kg/m2.  - Encourage losing weight - Exercise and healthy diet       Subjective: Continues to complain of nausea and vomiting  Physical Exam: Vitals:   03/02/24 2219 03/02/24 2251 03/03/24 0308 03/03/24 0740  BP: (!) 174/97 (!) 165/92 (!) 158/98 (!) 187/131  Pulse: 90 76 68 70  Resp: 20  20 20   Temp: 98.3 F (36.8 C)  98.2 F (36.8 C) 98.3 F (36.8 C)  TempSrc: Oral  Oral Oral  SpO2: 100%  100% 97%  Weight:      Height:       General: Not in acute distress.  Moist mucous membranes HEENT:       Eyes: PERRL, EOMI, no jaundice  ENT: No discharge from the ears and nose, no pharynx injection, no tonsillar enlargement.        Neck: No JVD, no bruit, no mass felt. Heme: No neck lymph node enlargement. Cardiac: S1/S2, RRR, No murmurs, No gallops or rubs. Respiratory: No rales, wheezing, rhonchi or rubs. GI: Soft, nondistended, nontender, no rebound pain, no organomegaly, BS present. GU: No hematuria Ext: No pitting leg edema bilaterally. 1+DP/PT pulse bilaterally. Musculoskeletal: No joint deformities, No joint redness or warmth, no limitation of ROM in spin. Skin: No rashes.  Neuro: Alert, oriented X3, cranial nerves II-XII grossly intact,  moves all extremities normally.  Psych: Patient is not psychotic, no suicidal or hemocidal ideation.     Data Reviewed: Potassium 3.4, calcium 8.8 Labs reviewed  Family Communication: Plan of care discussed with patient in detail.  Patient advised to abstain from further cannabinoid use  Disposition: Status is: Inpatient Remains inpatient appropriate because: Unable to tolerate oral intake  Planned Discharge Destination:Home    Time spent: 45  minutes  Author: Aimee Somerset, MD 03/03/2024 12:02 PM  For on call review [email or web address].

## 2024-03-04 DIAGNOSIS — R112 Nausea with vomiting, unspecified: Secondary | ICD-10-CM | POA: Diagnosis not present

## 2024-03-04 LAB — BASIC METABOLIC PANEL WITH GFR
Anion gap: 11 (ref 5–15)
BUN: 9 mg/dL (ref 6–20)
CO2: 21 mmol/L — ABNORMAL LOW (ref 22–32)
Calcium: 8.9 mg/dL (ref 8.9–10.3)
Chloride: 104 mmol/L (ref 98–111)
Creatinine, Ser: 0.6 mg/dL (ref 0.44–1.00)
GFR, Estimated: >60 mL/min (ref >60)
Glucose, Bld: 90 mg/dL (ref 70–99)
Potassium: 3.3 mmol/L — ABNORMAL LOW (ref 3.5–5.1)
Sodium: 136 mmol/L (ref 135–145)

## 2024-03-04 LAB — MAGNESIUM: Magnesium: 2 mg/dL (ref 1.7–2.4)

## 2024-03-04 LAB — PHOSPHORUS: Phosphorus: 3.8 mg/dL (ref 2.5–4.6)

## 2024-03-04 MED ORDER — POTASSIUM CHLORIDE 2 MEQ/ML IV SOLN: INTRAVENOUS | Status: DC

## 2024-03-04 MED ORDER — AMLODIPINE BESYLATE 5 MG PO TABS
5.0000 mg | ORAL_TABLET | Freq: Every day | ORAL | Status: DC
  Administered 2024-03-04 – 2024-03-05 (×2): 5 mg via ORAL
  Filled 2024-03-04 (×2): qty 1

## 2024-03-04 MED ORDER — KCL-LACTATED RINGERS 20 MEQ/L IV SOLN
INTRAVENOUS | Status: DC
  Filled 2024-03-04: qty 1000

## 2024-03-04 MED ORDER — METOCLOPRAMIDE HCL 5 MG/ML IJ SOLN
10.0000 mg | Freq: Three times a day (TID) | INTRAMUSCULAR | Status: DC
  Administered 2024-03-04 – 2024-03-05 (×3): 10 mg via INTRAVENOUS
  Filled 2024-03-04 (×3): qty 2

## 2024-03-04 MED ORDER — POTASSIUM CHLORIDE 2 MEQ/ML IV SOLN
INTRAVENOUS | Status: AC
  Filled 2024-03-04 (×3): qty 1000

## 2024-03-04 NOTE — Plan of Care (Signed)
  Problem: Education: Goal: Knowledge of General Education information will improve Description: Including pain rating scale, medication(s)/side effects and non-pharmacologic comfort measures 03/04/2024 0556 by Janit Orvin HERO, RN Outcome: Progressing 03/04/2024 0556 by Janit Orvin HERO, RN Outcome: Progressing   Problem: Health Behavior/Discharge Planning: Goal: Ability to manage health-related needs will improve Outcome: Progressing   Problem: Clinical Measurements: Goal: Ability to maintain clinical measurements within normal limits will improve Outcome: Progressing Goal: Will remain free from infection Outcome: Progressing Goal: Diagnostic test results will improve Outcome: Progressing Goal: Respiratory complications will improve Outcome: Progressing Goal: Cardiovascular complication will be avoided Outcome: Progressing   Problem: Activity: Goal: Risk for activity intolerance will decrease Outcome: Progressing   Problem: Nutrition: Goal: Adequate nutrition will be maintained Outcome: Progressing   Problem: Coping: Goal: Level of anxiety will decrease Outcome: Progressing   Problem: Elimination: Goal: Will not experience complications related to bowel motility Outcome: Progressing Goal: Will not experience complications related to urinary retention Outcome: Progressing   Problem: Pain Managment: Goal: General experience of comfort will improve and/or be controlled Outcome: Progressing   Problem: Safety: Goal: Ability to remain free from injury will improve Outcome: Progressing   Problem: Skin Integrity: Goal: Risk for impaired skin integrity will decrease Outcome: Progressing

## 2024-03-04 NOTE — Progress Notes (Signed)
 Progress Note   Patient: Brandi [name] FMW:[id number] DOB: 06-24-1996 DOA: 03/01/2024     2 DOS: the patient was seen and examined on 03/04/2024   Brief hospital course:  Brandi [name] is a 28 y.o. female with medical history significant of marijuana use, hypertension, asthma, GERD, morbid obesity, who presents with intractable nausea and vomiting.   Patient has intractable nausea and vomiting for more than 3 days.  Patient was seen in the ED on 7/1; gave supportive care and felt better when she left.  She was given prescription for nausea medication including Zofran  and Phenergan .  She was given prescription for Augmentin  for right ear pain due to possible ear infection on 7/1, patient has not started taking this antibiotic yet.  No fever or chills.  No ear drainage.  No hearing loss.  Patient states that  She could not fill her prescription since the pharmacy was closed.  She continues to have severe intractable nausea, vomiting, cannot keep anything down.  She states that she has more than 10 times of nonbilious vomiting.  At the very beginning, she noticed streaks of blood in the vomitus, which has resolved.  There is no blood in today's vomitus.  She does not have abdominal pain.  She had loose stool bowel movement at the very beginning, which has resolved.  Currently no active diarrhea. Patient does not have chest pain, cough and SOB.  No symptoms of UTI.     Data reviewed independently and ED Course: pt was found to have WBC 16.6, GFR> 60, potassium 3.2, hemoglobin stable 12.6 (12.7 on 02/29/2024), negative pregnancy test on 7/1.  Temperature normal, blood pressure 132/82, heart rate 105 --> 61, RR 18, oxygen saturation 99% on room air.  Patient is placed in MedSurg bed for observation.     EKG: I have personally reviewed.  Sinus rhythm, QTc 439, early R wave progression, irregular sinus.   Assessment and Plan:  Cannabis hyperemesis syndrome Refractory nausea and vomiting.  Still unable to  tolerate any oral intake Has been on IV promethazine  without any improvement Will switch to scheduled Reglan  Continue symptom control with antiemetics, IV PPI, IV fluid hydration Urine drug screen is positive for cannabinoids and benzodiazepine       Severe hypokalemia/hypophosphatemia Secondary to GI losses from nausea and vomiting Supplement potassium and phosphorus Check magnesium  levels       HTN (hypertension):  Continue amlodipine   -IV hydralazine  as needed for systolic blood pressure greater than 160     GERD (gastroesophageal reflux disease) -On Protonix      Asthma:  Stable -As needed albuterol  and Mucinex        Right middle ear infection Continue Augmentin  when patient is able to tolerate oral intake    Obesity, Class III, BMI 40-49.9 (morbid obesity):  with body weight 106 Kg and BMI 41.4  kg/m2.  - Encourage losing weight - Exercise and healthy diet       Subjective: She remains nauseous and continues to have episodes of vomiting  Physical Exam: Vitals:   03/04/24 0348 03/04/24 0739 03/04/24 0929 03/04/24 1145  BP: (!) 143/82 (!) 165/95 (!) 163/104 (!) 172/97  Pulse: 81 84    Resp: 16 16    Temp: 98.7 F (37.1 C) 98.9 F (37.2 C)    TempSrc:  Oral    SpO2: 100% 100%    Weight:      Height:       General: Not in acute distress.  Moist mucous membranes, obese  HEENT:       Eyes: PERRL, EOMI, no jaundice       ENT: No discharge from the ears and nose, no pharynx injection, no tonsillar enlargement.        Neck: No JVD, no bruit, no mass felt. Heme: No neck lymph node enlargement. Cardiac: S1/S2, RRR, No murmurs, No gallops or rubs. Respiratory: No rales, wheezing, rhonchi or rubs. GI: Soft, nondistended, nontender, no rebound pain, no organomegaly, BS present. GU: No hematuria Ext: No pitting leg edema bilaterally. 1+DP/PT pulse bilaterally. Musculoskeletal: No joint deformities, No joint redness or warmth, no limitation of ROM in  spin. Skin: No rashes.  Neuro: Alert, oriented X3, cranial nerves II-XII grossly intact, moves all extremities normally.  Psych: Patient is not psychotic, no suicidal or hemocidal ideation.     Data Reviewed: Potassium 3.3 Labs reviewed  Family Communication: Plan of care discussed with patient in detail.  She verbalizes understanding and agrees with the plan  Disposition: Status is: Inpatient Remains inpatient appropriate because: Still unable to tolerate oral intake  Planned Discharge Destination: Home    Time spent: 40 minutes  Author: Aimee Somerset, MD 03/04/2024 12:04 PM  For on call review [email or web address].

## 2024-03-04 NOTE — Progress Notes (Signed)
 BP continues to be elevated after the patient received norvasc  and hydralazine . MD notified. No new orders at this time

## 2024-03-04 NOTE — Progress Notes (Signed)
 Patient is asking for something to eat. She said she is not nauseated at the moment and that not eating can make her feel nauseated. MD informed and is starting the patient on clear liquids

## 2024-03-04 NOTE — Plan of Care (Signed)
  Problem: Education: Goal: Knowledge of General Education information will improve Description: Including pain rating scale, medication(s)/side effects and non-pharmacologic comfort measures Outcome: Progressing   Problem: Health Behavior/Discharge Planning: Goal: Ability to manage health-related needs will improve Outcome: Progressing   Problem: Clinical Measurements: Goal: Ability to maintain clinical measurements within normal limits will improve Outcome: Progressing Goal: Diagnostic test results will improve Outcome: Progressing   Problem: Activity: Goal: Risk for activity intolerance will decrease Outcome: Progressing   

## 2024-03-04 NOTE — Significant Event (Signed)
       CROSS COVER NOTE  NAME: Brandi [name] MRN: [id number] DOB : 1995/10/28 ATTENDING PHYSICIAN: Lanetta Lingo, MD    Date of Service   03/04/2024   HPI/Events of Note   Message received from RN IV fluids requested as patient is NPO  HPI 28 year old admitted with cannabis hyperemesis syndrome with subsequent low electrolytes as a result.  Also treated with labetalol  overnight for resistant hypertension not responsive to previously ordered hydralazine  AM chem oanel  Latest Reference Range & Units 03/04/24 03:15  Sodium 135 - 145 mmol/L 136  Potassium 3.5 - 5.1 mmol/L 3.3 (L)  Chloride 98 - 111 mmol/L 104  CO2 22 - 32 mmol/L 21 (L)  Glucose 70 - 99 mg/dL 90  BUN 6 - 20 mg/dL 9  Creatinine 9.55 - 8.99 mg/dL 9.39  Calcium 8.9 - 89.6 mg/dL 8.9  Anion gap 5 - 15  11  GFR, Estimated >60 mL/min >60  (L): Data is abnormally low   Interventions   Assessment/Plan:    03/04/2024    3:48 AM 03/03/2024   11:36 PM 03/03/2024   11:35 PM  Vitals with BMI  Systolic 143 152 846  Diastolic 82 90 86  Pulse 81 73      Initiate IV fluids LR with 20 KCL at 100 ml/h continuous magnesium  and phosphorous levels requested from blood in lab        Erminio LITTIE Cone NP [facility] Cross Cover 7pm-7am - check amion for availability Pager [phone]

## 2024-03-05 DIAGNOSIS — R112 Nausea with vomiting, unspecified: Secondary | ICD-10-CM | POA: Diagnosis not present

## 2024-03-05 LAB — BASIC METABOLIC PANEL WITH GFR
Anion gap: 11 (ref 5–15)
BUN: 6 mg/dL (ref 6–20)
CO2: 21 mmol/L — ABNORMAL LOW (ref 22–32)
Calcium: 8.7 mg/dL — ABNORMAL LOW (ref 8.9–10.3)
Chloride: 101 mmol/L (ref 98–111)
Creatinine, Ser: 0.7 mg/dL (ref 0.44–1.00)
GFR, Estimated: >60 mL/min (ref >60)
Glucose, Bld: 126 mg/dL — ABNORMAL HIGH (ref 70–99)
Potassium: 3.5 mmol/L (ref 3.5–5.1)
Sodium: 133 mmol/L — ABNORMAL LOW (ref 135–145)

## 2024-03-05 MED ORDER — PANTOPRAZOLE SODIUM 40 MG PO TBEC: 40.0000 mg | DELAYED_RELEASE_TABLET | Freq: Every day | ORAL | 11 refills | Status: DC

## 2024-03-05 NOTE — Progress Notes (Signed)
Discharge instructions reviewed with the patient.  Patient sent out via wheelchair with belongings 

## 2024-03-05 NOTE — Plan of Care (Signed)

## 2024-03-05 NOTE — Discharge Summary (Signed)
 Physician Discharge Summary   Patient: Brandi [name] MRN: [id number] DOB: 1996/04/01  Admit date:     03/01/2024  Discharge date: 03/05/24  Discharge Physician: Tylyn Stankovich   PCP: Patient, No Pcp Per   Recommendations at discharge:   Avoid further cannabis use Take medications as recommended  Discharge Diagnoses: Principal Problem:   Intractable nausea and vomiting Active Problems:   Marijuana use   HTN (hypertension)   GERD (gastroesophageal reflux disease)   Asthma   Hypokalemia   Hypophosphatemia   Right middle ear infection   Obesity, Class III, BMI 40-49.9 (morbid obesity)  Resolved Problems:   * No resolved hospital problems. *  Hospital Course: Brandi [name] is a 28 y.o. female with medical history significant of marijuana use, hypertension, asthma, GERD, morbid obesity, who presents with intractable nausea and vomiting.   Patient has intractable nausea and vomiting for more than 3 days.  Patient was seen in the ED on 7/1; received supportive care and felt better when she left.  She was given prescription for nausea including Zofran  and Phenergan .  She was given prescription for Augmentin  for right ear pain due to possible ear infection on 7/1, patient has not started taking this antibiotic yet.  No fever or chills.  No ear drainage.  No hearing loss.  Patient states that  She could not fill her prescription since the pharmacy was closed.  She continues to have severe intractable nausea, vomiting, cannot keep anything down.  She states that she has more than 10 times of nonbilious vomiting.  At the very beginning, she noticed streaks of blood in the vomitus, which has resolved.  There is no blood in today's vomitus.  She does not have abdominal pain.  She had loose stool bowel movement at the very beginning, which has resolved.  Currently no active diarrhea. Patient does not have chest pain, cough and SOB.  No symptoms of UTI.     Data reviewed independently and ED Course:  pt was found to have WBC 16.6, GFR> 60, potassium 3.2, hemoglobin stable 12.6 (12.7 on 02/29/2024), negative pregnancy test on 7/1.  Temperature normal, blood pressure 132/82, heart rate 105 --> 61, RR 18, oxygen saturation 99% on room air.  Patient is placed in MedSurg bed for observation.     EKG: I have personally reviewed.  Sinus rhythm, QTc 439, early R wave progression, irregular sinus.      Assessment and Plan:  Cannabis hyperemesis syndrome Refractory nausea and vomiting.  Improved.  Able to tolerate oral intake Symptoms improved markedly with IV Reglan  Urine drug screen is positive for cannabinoids and benzodiazepine Patient advised to abstain from further marijuana use     Severe hypokalemia/hypophosphatemia Secondary to GI losses from nausea and vomiting Potassium levels have been supplemented       HTN (hypertension):  Continue amlodipine   Blood pressure stable     GERD (gastroesophageal reflux disease) Continue Protonix      Asthma:  Stable -As needed albuterol  and Mucinex     Right middle ear infection Continue Augmentin       Obesity, Class III, BMI 40-49.9 (morbid obesity):  with body weight 106 Kg and BMI 41.4  kg/m2.  - Encourage losing weight - Exercise and healthy diet      Patient was seen and examined at the bedside and is in stable condition for discharge       Consultants: None Procedures performed: None  Disposition: Home Diet recommendation:  Discharge Diet Orders (From admission, onward)  Start     Ordered   03/05/24 0000  Diet - low sodium heart healthy        03/05/24 1118           Cardiac diet DISCHARGE MEDICATION: Allergies as of 03/05/2024       Reactions   Shellfish Allergy Anaphylaxis        Medication List     TAKE these medications    amLODipine  5 MG tablet Commonly known as: NORVASC  Take 1 tablet (5 mg total) by mouth daily.   amoxicillin -clavulanate 875-125 MG tablet Commonly known as:  AUGMENTIN  Take 1 tablet by mouth 2 (two) times daily for 7 days.   dicyclomine  10 MG capsule Commonly known as: Bentyl  Take 1 capsule (10 mg total) by mouth every 6 (six) hours as needed for up to 7 days for spasms (abdominal pain).   ondansetron  4 MG tablet Commonly known as: Zofran  Take 1 tablet (4 mg total) by mouth every 6 (six) hours as needed for up to 7 days for nausea or vomiting.   pantoprazole  40 MG tablet Commonly known as: Protonix  Take 1 tablet (40 mg total) by mouth daily.   promethazine  12.5 MG tablet Commonly known as: PHENERGAN  Take 1 tablet (12.5 mg total) by mouth every 6 (six) hours as needed for up to 5 days for nausea or vomiting.   Slynd  4 MG Tabs Generic drug: Drospirenone  Take 1 tablet (4 mg total) by mouth daily.        Discharge Exam: Filed Weights   03/01/24 1235  Weight: 106 kg   General: Not in acute distress.  Moist mucous membranes, obese HEENT:       Eyes: PERRL, EOMI, no jaundice       ENT: No discharge from the ears and nose, no pharynx injection, no tonsillar enlargement.        Neck: No JVD, no bruit, no mass felt. Heme: No neck lymph node enlargement. Cardiac: S1/S2, RRR, No murmurs, No gallops or rubs. Respiratory: No rales, wheezing, rhonchi or rubs. GI: Soft, nondistended, nontender, no rebound pain, no organomegaly, BS present. GU: No hematuria Ext: No pitting leg edema bilaterally. 1+DP/PT pulse bilaterally. Musculoskeletal: No joint deformities, No joint redness or warmth, no limitation of ROM in spin. Skin: No rashes.  Neuro: Alert, oriented X3, cranial nerves II-XII grossly intact, moves all extremities normally.  Psych: Patient is not psychotic, no suicidal or hemocidal ideation.      Condition at discharge: stable  The results of significant diagnostics from this hospitalization (including imaging, microbiology, ancillary and laboratory) are listed below for reference.   Imaging Studies: No results  found.  Microbiology: Results for orders placed or performed during the hospital encounter of 12/28/22  Urine Culture     Status: Abnormal   Collection Time: 12/28/22  3:40 PM   Specimen: Urine, Random  Result Value Ref Range Status   Specimen Description   Final    URINE, RANDOM Performed at [facility], [address] [place], [place] [address]    Special Requests   Final    NONE Performed at [facility], [address] [place], [place] [address]    Culture MULTIPLE SPECIES PRESENT, SUGGEST RECOLLECTION (A)  Final   Report Status 12/30/2022 FINAL  Final    Labs: CBC: Recent Labs  Lab 02/29/24 0827 03/01/24 1237 03/02/24 0331  WBC 19.1* 16.6* 18.5*  HGB 12.7 12.6 11.5*  HCT 38.3 37.5 34.5*  MCV 89.7 88.9 88.7  PLT 430* 400 366   Basic Metabolic Panel: Recent Labs  Lab 03/01/24 1237 03/02/24 0331 03/02/24 1756 03/04/24 0315 03/05/24 0500  NA 137 138 138 136 133*  K 3.2* 2.8* 3.4* 3.3* 3.5  CL 104 103 104 104 101  CO2 22 22 22  21* 21*  GLUCOSE 137* 103* 107* 90 126*  BUN 13 11 7 9 6   CREATININE 0.80 0.71 0.73 0.60 0.70  CALCIUM 9.1 8.7* 8.8* 8.9 8.7*  MG 2.3 2.0  --  2.0  --   PHOS 1.5* 3.9  --  3.8  --    Liver Function Tests: Recent Labs  Lab 02/29/24 0827 03/01/24 1237  AST 21 24  ALT 18 20  ALKPHOS 63 59  BILITOT 0.9 1.1  PROT 7.9 7.7  ALBUMIN 3.9 3.8   CBG: No results for input(s): GLUCAP in the last 168 hours.  Discharge time spent: greater than 30 minutes.  Signed: Aimee Somerset, MD [facility] 03/05/2024

## 2024-03-05 NOTE — Progress Notes (Signed)
 IV obtained by staff RN.

## 2024-03-30 ENCOUNTER — Ambulatory Visit
Admission: EM | Admit: 2024-03-30 | Discharge: 2024-03-30 | Disposition: A | Discharge status: Home / Self Care | Attending: Nurse Practitioner | Admitting: Nurse Practitioner

## 2024-03-30 DIAGNOSIS — J029 Acute pharyngitis, unspecified: Secondary | ICD-10-CM | POA: Insufficient documentation

## 2024-03-30 LAB — POCT RAPID STREP A (OFFICE): Rapid Strep A Screen: NEGATIVE

## 2024-03-30 MED ORDER — PREDNISONE 20 MG PO TABS: 40.0000 mg | ORAL_TABLET | Freq: Every day | ORAL | 0 refills | Status: AC

## 2024-03-30 NOTE — ED Triage Notes (Signed)
 Patient to Urgent Care with complaints of bilateral ear pain (feels like the right side is worse). Symptoms for approx one month.   Treated in the hospital with augmentin  7/6. Doesn't feel like this fully cleared the infection.

## 2024-03-30 NOTE — Discharge Instructions (Addendum)
 You were evaluated today for persistent right ear pain and sore throat. Your ear was previously diagnosed as an ear infection and treated with antibiotics, but the pain has continued. On today's exam, a piece of wax was removed from your ear, and the eardrum appeared normal with no signs of active infection. Given the normal ear exam and your current throat symptoms, your ear pain is likely referred from throat inflammation. A rapid strep test was negative, and a throat culture was sent to check for bacterial infection.  Your symptoms are most consistent with a viral upper respiratory infection. Supportive care is recommended. You may take Tylenol  or ibuprofen  for pain or fever. Drink plenty of fluids, rest as much as possible, and use warm saltwater gargles or throat lozenges for throat discomfort. A humidifier at night may also help relieve congestion and dryness.  Check your patient portal for throat culture results. Follow up with your primary care provider if symptoms worsen, persist beyond a few days, or if new symptoms develop. Go to the emergency department if you experience high fever, difficulty swallowing, severe ear pain, swelling of the neck or face, or any signs of dehydration such as dry mouth, dizziness, or reduced urination.

## 2024-03-30 NOTE — ED Provider Notes (Signed)
 Brandi [name]    CSN: [id number] Arrival date & time: 03/30/24  1521      History   Chief Complaint Chief Complaint  Patient presents with   Otalgia    HPI Brandi [name] is a 28 y.o. female.   Discussed the use of AI scribe software for clinical note transcription with the patient, who gave verbal consent to proceed.   Patient presents with right ear pain and sore throat. She reports a complex medical history over the past month, including multiple ER visits for intractable nausea and vomiting, which led to a brief hospital admission. The patient was diagnosed with hyperemesis due to cannabinoids, though she believes it was due to excessive alcohol consumption with a hangover.  The patient was seen in the ER on July 1st for nausea, vomiting, and ear pain. She was prescribed Augmentin  for a right ear infection, noting redness and bulging of the right tympanic membrane at that time. She reports receiving only a 5-day supply instead of the prescribed 7-day course. The patient states her ear pain has persisted and worsened since the ER visit, lasting for about a month. She denies any fluid discharge from the ear, dizziness, or tinnitus.   In addition to the ear pain, the patient reports throat pain and sore tonsils. She mentions right sided neck pain but denies fever.   The following portions of the patient's history were reviewed and updated as appropriate: allergies, current medications, past family history, past medical history, past social history, past surgical history, and problem list.    Past Medical History:  Diagnosis Date   Asthma    GERD (gastroesophageal reflux disease)    HTN (hypertension)     Patient Active Problem List   Diagnosis Date Noted   Intractable nausea and vomiting 03/01/2024   Obesity, Class III, BMI 40-49.9 (morbid obesity) 03/01/2024   Hypokalemia 03/01/2024   Marijuana use 03/01/2024   Right middle ear infection 03/01/2024    Hypophosphatemia 03/01/2024   HTN (hypertension)    Asthma    GERD (gastroesophageal reflux disease)    Elevated BP without diagnosis of hypertension 01/24/2020   Syncope 03/02/2019    Past Surgical History:  Procedure Laterality Date   dental procedure      OB History     Gravida  0   Para  0   Term  0   Preterm  0   AB  0   Living  0      SAB  0   IAB  0   Ectopic  0   Multiple  0   Live Births               Home Medications    Prior to Admission medications   Medication Sig Start Date End Date Taking? Authorizing Provider  predniSONE  (DELTASONE ) 20 MG tablet Take 2 tablets (40 mg total) by mouth daily for 5 days. 03/30/24 04/04/24 Yes Iola Lukes, FNP  amLODipine  (NORVASC ) 5 MG tablet Take 1 tablet (5 mg total) by mouth daily. 05/26/23 08/24/23  Suzanne Kirsch, MD  dicyclomine  (BENTYL ) 10 MG capsule Take 1 capsule (10 mg total) by mouth every 6 (six) hours as needed for up to 7 days for spasms (abdominal pain). 12/04/19 12/11/19  Viviann Pastor, MD  Drospirenone  (SLYND ) 4 MG TABS Take 1 tablet (4 mg total) by mouth daily. 08/05/23   Izell Harari, MD  pantoprazole  (PROTONIX ) 40 MG tablet Take 1 tablet (40 mg total) by mouth daily. 03/05/24  03/05/25  Lanetta Lingo, MD  promethazine  (PHENERGAN ) 12.5 MG tablet Take 1 tablet (12.5 mg total) by mouth every 6 (six) hours as needed for up to 5 days for nausea or vomiting. 02/29/24 03/05/24  Levander Slate, MD    Family History Family History  Problem Relation Age of Onset   Hypertension Maternal Grandmother    Diabetes Maternal Grandmother     Social History Social History   Tobacco Use   Smoking status: Former    Types: Cigarettes   Smokeless tobacco: Never  Vaping Use   Vaping status: Never Used  Substance Use Topics   Alcohol use: Yes    Comment: social   Drug use: Yes    Types: Marijuana     Allergies   Shellfish allergy   Review of Systems Review of Systems  Constitutional:  Negative  for fever.  HENT:  Positive for ear pain and sore throat. Negative for hearing loss and tinnitus.   Respiratory:  Negative for cough.   Gastrointestinal:  Negative for nausea and vomiting.  Neurological:  Negative for dizziness and headaches.  All other systems reviewed and are negative.    Physical Exam Triage Vital Signs ED Triage Vitals [03/30/24 1532]  Encounter Vitals Group     BP 115/76     Girls Systolic BP Percentile      Girls Diastolic BP Percentile      Boys Systolic BP Percentile      Boys Diastolic BP Percentile      Pulse Rate (!) 101     Resp 18     Temp 98.2 F (36.8 C)     Temp src      SpO2 98 %     Weight      Height      Head Circumference      Peak Flow      Pain Score 10     Pain Loc      Pain Education      Exclude from Growth Chart    No data found.  Updated Vital Signs BP 115/76   Pulse (!) 101   Temp 98.2 F (36.8 C)   Resp 18   SpO2 98%   Visual Acuity Right Eye Distance:   Left Eye Distance:   Bilateral Distance:    Right Eye Near:   Left Eye Near:    Bilateral Near:     Physical Exam Vitals reviewed.  Constitutional:      General: She is awake. She is not in acute distress.    Appearance: Normal appearance. She is well-developed. She is not ill-appearing, toxic-appearing or diaphoretic.  HENT:     Head: Normocephalic.     Right Ear: Hearing, tympanic membrane, ear canal and external ear normal. Tenderness present. No drainage or swelling. No middle ear effusion. There is no impacted cerumen. No mastoid tenderness. Tympanic membrane is not perforated, erythematous or bulging.     Left Ear: Hearing, tympanic membrane, ear canal and external ear normal.     Ears:     Comments: Right ear canal contained a piece of cerumen obstructing full view of the tympanic membrane. After removal of the cerumen with scoop, the tympanic membrane appeared normal with no redness, swelling, or fluid noted.    Nose: Nose normal.     Mouth/Throat:      Mouth: Mucous membranes are moist.     Pharynx: Uvula midline. Pharyngeal swelling present. No oropharyngeal exudate or posterior oropharyngeal erythema.  Eyes:  General: Vision grossly intact.     Conjunctiva/sclera: Conjunctivae normal.  Cardiovascular:     Rate and Rhythm: Normal rate and regular rhythm.     Heart sounds: Normal heart sounds.  Pulmonary:     Effort: Pulmonary effort is normal.     Breath sounds: Normal breath sounds and air entry.  Musculoskeletal:        General: Normal range of motion.     Cervical back: Full passive range of motion without pain, normal range of motion and neck supple.  Lymphadenopathy:     Cervical: Cervical adenopathy present.     Right cervical: Superficial cervical adenopathy present.     Left cervical: Superficial cervical adenopathy present.  Skin:    General: Skin is warm and dry.  Neurological:     General: No focal deficit present.     Mental Status: She is alert and oriented to person, place, and time.  Psychiatric:        Speech: Speech normal.        Behavior: Behavior is cooperative.      UC Treatments / Results  Labs (all labs ordered are listed, but only abnormal results are displayed) Labs Reviewed  CULTURE, GROUP A STREP [facility])  POCT RAPID STREP A (OFFICE)    EKG   Radiology No results found.  Procedures Procedures (including critical care time)  Medications Ordered in UC Medications - No data to display  Initial Impression / Assessment and Plan / UC Course  I have reviewed the triage vital signs and the nursing notes.  Pertinent labs & imaging results that were available during my care of the patient were reviewed by me and considered in my medical decision making (see chart for details).     Patient presents with persistent right ear pain since early July, initially diagnosed in the ER as otitis media with noted erythema and bulging of the tympanic membrane. She was prescribed Augmentin  but only  completed a 5-day course due to limited supply. Despite treatment, her ear pain has continued and may have worsened. On today's exam, cerumen was noted obstructing the view of the tympanic membrane. After removal, the tympanic membrane appeared normal with no signs of acute infection. Given the normal ear exam and concurrent complaints of sore throat, her ear pain is likely referred from pharyngeal inflammation. A rapid strep test was negative; throat culture is pending. Symptoms are consistent with a viral upper respiratory infection. Supportive care recommended. Patient advised to follow up with PCP as needed or return to the ED for worsening symptoms, high fever, or difficulty swallowing.   Final Clinical Impressions(s) / UC Diagnoses   Final diagnoses:  Acute sore throat  Viral pharyngitis     Discharge Instructions      You were evaluated today for persistent right ear pain and sore throat. Your ear was previously diagnosed as an ear infection and treated with antibiotics, but the pain has continued. On today's exam, a piece of wax was removed from your ear, and the eardrum appeared normal with no signs of active infection. Given the normal ear exam and your current throat symptoms, your ear pain is likely referred from throat inflammation. A rapid strep test was negative, and a throat culture was sent to check for bacterial infection.  Your symptoms are most consistent with a viral upper respiratory infection. Supportive care is recommended. You may take Tylenol  or ibuprofen  for pain or fever. Drink plenty of fluids, rest as much as possible, and  use warm saltwater gargles or throat lozenges for throat discomfort. A humidifier at night may also help relieve congestion and dryness.  Check your patient portal for throat culture results. Follow up with your primary care provider if symptoms worsen, persist beyond a few days, or if new symptoms develop. Go to the emergency department if you  experience high fever, difficulty swallowing, severe ear pain, swelling of the neck or face, or any signs of dehydration such as dry mouth, dizziness, or reduced urination.      ED Prescriptions     Medication Sig Dispense Auth. Provider   predniSONE  (DELTASONE ) 20 MG tablet Take 2 tablets (40 mg total) by mouth daily for 5 days. 10 tablet Iola Lukes, FNP      PDMP not reviewed this encounter.   Iola Lukes, [place] 03/30/24 [phone]

## 2024-04-02 LAB — CULTURE, GROUP A STREP (THRC)

## 2024-04-03 ENCOUNTER — Ambulatory Visit (HOSPITAL_COMMUNITY): Payer: Self-pay

## 2024-04-05 DIAGNOSIS — Z1329 Encounter for screening for other suspected endocrine disorder: Secondary | ICD-10-CM | POA: Diagnosis not present

## 2024-04-05 DIAGNOSIS — Z0001 Encounter for general adult medical examination with abnormal findings: Secondary | ICD-10-CM | POA: Diagnosis not present

## 2024-04-05 DIAGNOSIS — Z9189 Other specified personal risk factors, not elsewhere classified: Secondary | ICD-10-CM | POA: Diagnosis not present

## 2024-04-05 DIAGNOSIS — R635 Abnormal weight gain: Secondary | ICD-10-CM | POA: Diagnosis not present

## 2024-04-05 DIAGNOSIS — I889 Nonspecific lymphadenitis, unspecified: Secondary | ICD-10-CM | POA: Diagnosis not present

## 2024-04-05 DIAGNOSIS — R1013 Epigastric pain: Secondary | ICD-10-CM | POA: Diagnosis not present

## 2024-04-05 DIAGNOSIS — Z136 Encounter for screening for cardiovascular disorders: Secondary | ICD-10-CM | POA: Diagnosis not present

## 2024-04-05 DIAGNOSIS — Z131 Encounter for screening for diabetes mellitus: Secondary | ICD-10-CM | POA: Diagnosis not present

## 2024-04-11 ENCOUNTER — Ambulatory Visit: Admitting: Obstetrics and Gynecology

## 2024-04-11 ENCOUNTER — Other Ambulatory Visit (HOSPITAL_COMMUNITY)
Admission: RE | Admit: 2024-04-11 | Discharge: 2024-04-11 | Disposition: A | Discharge status: Home / Self Care | Source: Ambulatory Visit | Attending: Obstetrics and Gynecology | Admitting: Obstetrics and Gynecology

## 2024-04-11 VITALS — BP 132/82 | HR 80 | Wt 226.0 lb

## 2024-04-11 DIAGNOSIS — B3731 Acute candidiasis of vulva and vagina: Secondary | ICD-10-CM | POA: Diagnosis present

## 2024-04-11 DIAGNOSIS — N760 Acute vaginitis: Secondary | ICD-10-CM | POA: Diagnosis not present

## 2024-04-11 DIAGNOSIS — Z3041 Encounter for surveillance of contraceptive pills: Secondary | ICD-10-CM | POA: Diagnosis not present

## 2024-04-11 MED ORDER — FLUCONAZOLE 150 MG PO TABS: 150.0000 mg | ORAL_TABLET | Freq: Once | ORAL | 0 refills | Status: AC

## 2024-04-11 NOTE — Progress Notes (Signed)
 CC: has been on antibiotics and Is pretty sure she has yeast infection

## 2024-04-11 NOTE — Progress Notes (Signed)
 Obstetrics and Gynecology Visit Return Patient Evaluation  Appointment Date: 04/11/2024   OBGYN Clinic: Center for [facility]  Chief Complaint: vaginitis s/p PO abx for her mouth  History of Present Illness:  Brandi [name] is a 28 y.o. with above CC. +white d/c and itching. No abdominal pain or lower urinary tract s/s. Hasn't tried anything OTC  No issues with slynd  and usually with amenorrhea. Pt states she changed jobs and BP down and she took herself off her norvasc  and no issues.   Review of Systems: as noted in the History of Present Illness.  Patient Active Problem List   Diagnosis Date Noted   Intractable nausea and vomiting 03/01/2024   Obesity, Class III, BMI 40-49.9 (morbid obesity) 03/01/2024   Hypokalemia 03/01/2024   Marijuana use 03/01/2024   Right middle ear infection 03/01/2024   Hypophosphatemia 03/01/2024   HTN (hypertension)    Asthma    GERD (gastroesophageal reflux disease)    Elevated BP without diagnosis of hypertension 01/24/2020   Syncope 03/02/2019   Medications:  Brandi [name] had no medications administered during this visit. Current Outpatient Medications  Medication Sig Dispense Refill   fluconazole  (DIFLUCAN ) 150 MG tablet Take 1 tablet (150 mg total) by mouth once for 1 dose. Can take additional dose three days later if symptoms persist 2 tablet 0   dicyclomine  (BENTYL ) 10 MG capsule Take 1 capsule (10 mg total) by mouth every 6 (six) hours as needed for up to 7 days for spasms (abdominal pain). 20 capsule 0   Drospirenone  (SLYND ) 4 MG TABS Take 1 tablet (4 mg total) by mouth daily. 180 tablet 1   pantoprazole  (PROTONIX ) 40 MG tablet Take 1 tablet (40 mg total) by mouth daily. 30 tablet 11   promethazine  (PHENERGAN ) 12.5 MG tablet Take 1 tablet (12.5 mg total) by mouth every 6 (six) hours as needed for up to 5 days for nausea or vomiting. 20 tablet 0   No current facility-administered medications for this visit.     Allergies: is allergic to shellfish allergy.  Physical Exam:  BP 132/82   Pulse 80   Wt 226 lb (102.5 kg)   BMI 40.03 kg/m  Body mass index is 40.03 kg/m. General appearance: Well nourished, well developed female in no acute distress.  Neuro/Psych:  Normal mood and affect.    Assessment: patient stable  Plan:  1. Vaginal yeast infection (Primary) Self swab done today. Declines STD testing. - Cervicovaginal ancillary only( [place])  2. Contraception management  Return if symptoms worsen or fail to improve.  Future Appointments  Date Time Provider Department Center  04/18/2024  2:30 PM Reddy, Pallavi D, MD [id number] None    Bebe Izell Raddle MD Attending Center for [facility])

## 2024-04-12 LAB — CERVICOVAGINAL ANCILLARY ONLY
Bacterial Vaginitis (gardnerella): NEGATIVE
Candida Glabrata: NEGATIVE
Candida Vaginitis: POSITIVE — AB
Comment: NEGATIVE
Comment: NEGATIVE
Comment: NEGATIVE

## 2024-04-18 ENCOUNTER — Encounter: Payer: Self-pay | Admitting: Intensive Care

## 2024-04-18 ENCOUNTER — Emergency Department
Admission: EM | Admit: 2024-04-18 | Discharge: 2024-04-18 | Disposition: A | Discharge status: Home / Self Care | Attending: Emergency Medicine | Admitting: Emergency Medicine

## 2024-04-18 ENCOUNTER — Other Ambulatory Visit: Payer: Self-pay

## 2024-04-18 ENCOUNTER — Ambulatory Visit: Admitting: Sleep Medicine

## 2024-04-18 DIAGNOSIS — J029 Acute pharyngitis, unspecified: Secondary | ICD-10-CM | POA: Insufficient documentation

## 2024-04-18 DIAGNOSIS — M542 Cervicalgia: Secondary | ICD-10-CM | POA: Diagnosis present

## 2024-04-18 DIAGNOSIS — J392 Other diseases of pharynx: Secondary | ICD-10-CM

## 2024-04-18 DIAGNOSIS — I10 Essential (primary) hypertension: Secondary | ICD-10-CM | POA: Insufficient documentation

## 2024-04-18 MED ORDER — IBUPROFEN 600 MG PO TABS
600.0000 mg | ORAL_TABLET | Freq: Once | ORAL | Status: AC
  Administered 2024-04-18: 600 mg via ORAL
  Filled 2024-04-18: qty 1

## 2024-04-18 MED ORDER — PANTOPRAZOLE SODIUM 40 MG PO TBEC: 40.0000 mg | DELAYED_RELEASE_TABLET | Freq: Every day | ORAL | 11 refills | Status: DC

## 2024-04-18 MED ORDER — MENTHOL 3 MG MT LOZG
1.0000 | LOZENGE | OROMUCOSAL | Status: DC | PRN
  Administered 2024-04-18: 3 mg via ORAL
  Filled 2024-04-18: qty 9

## 2024-04-18 NOTE — Discharge Instructions (Addendum)
 Chloraseptic spray OTC Please use ibuprofen  (Motrin ) up to 800 mg every 8 hours and/or naproxen  (Naprosyn ) up to 500 mg every 12 hours for any continued pain.  Please do not use this medication regimen for longer than 7 days

## 2024-04-18 NOTE — ED Triage Notes (Signed)
 Patient c/o sore throat X1 month. Reports pain has gotten worse. Reports bilateral neck discomfort and reports feels swollen. Reports some ear pain and seen for same at [facility] 03/30/24  Reports some N/V that started yesterday  Prescribed antibiotics 1 month ago and last week by PCP for same symptoms per patient  Does not see dentist regularly

## 2024-04-18 NOTE — ED Provider Notes (Signed)
 [facility] Provider Note   Event Date/Time   First MD Initiated Contact with Patient 04/18/24 1215     (approximate) History  Sore Throat  HPI Brandi [name] is a 28 y.o. female with a past medical history of hypertension, obesity, marijuana use, and chronic pharyngitis who presents for sore throat over the last month.  Patient reports that this pain is now causing bilateral neck discomfort and feels that she cannot turn her head without pain.  Patient states that she was diagnosed with an ear infection on 03/30/2024 and completed a course of amoxicillin  at that time.  Patient was also seen recently and prescribed another course of antibiotics for presumed upper respiratory infection that did not improve her symptoms either.  Patient endorses worsening pain in the morning that somewhat improves throughout the day.  Patient does not take anything daily for her reflux.  Patient denies any fevers, recent travel, or sick contacts.  Patient does endorse mild clear rhinorrhea that improves throughout the day as well.  Patient continues to endorse mild left ear pain occasionally and especially worse in the morning that improves throughout the day. ROS: Patient currently denies any vision changes, tinnitus, difficulty speaking, facial droop, chest pain, shortness of breath, abdominal pain, nausea/vomiting/diarrhea, dysuria, or weakness/numbness/paresthesias in any extremity   Physical Exam  Triage Vital Signs: ED Triage Vitals  Encounter Vitals Group     BP 04/18/24 1212 (!) 159/113     Girls Systolic BP Percentile --      Girls Diastolic BP Percentile --      Boys Systolic BP Percentile --      Boys Diastolic BP Percentile --      Pulse Rate 04/18/24 1212 81     Resp 04/18/24 1212 18     Temp 04/18/24 1209 98.7 F (37.1 C)     Temp Source 04/18/24 1209 Oral     SpO2 04/18/24 1212 100 %     Weight 04/18/24 1206 217 lb 9.6 oz (98.7 kg)     Height 04/18/24 1211 5' 2.5  (1.588 m)     Head Circumference --      Peak Flow --      Pain Score 04/18/24 1210 10     Pain Loc --      Pain Education --      Exclude from Growth Chart --    Most recent vital signs: Vitals:   04/18/24 1209 04/18/24 1212  BP:  (!) 159/113  Pulse:  81  Resp:  18  Temp: 98.7 F (37.1 C)   SpO2:  100%   General: Awake, oriented x4. CV:  Good peripheral perfusion. Resp:  Normal effort. Abd:  No distention. Other:  Middle-aged obese African-American female resting comfortably in no acute distress.  Significant posterior oropharyngeal erythema with swollen tonsillar masses.  No tonsillar exudate appreciated.  TMs normal bilaterally.  Bilateral anterior cervical lymphadenopathy ED Results / Procedures / Treatments  Labs (all labs ordered are listed, but only abnormal results are displayed) Labs Reviewed - No data to display PROCEDURES: Critical Care performed: No Procedures MEDICATIONS ORDERED IN ED: Medications - No data to display IMPRESSION / MDM / ASSESSMENT AND PLAN / ED COURSE  I reviewed the triage vital signs and the nursing notes.                             The patient is on the cardiac monitor to  evaluate for evidence of arrhythmia and/or significant heart rate changes. Patient's presentation is most consistent with acute presentation with potential threat to life or bodily function. Patient is a 28 year old female who presents complaining of sore throat.  Patient states that this has been present over the last month without any red flag symptomatology.  Patient denies any fevers, productive cough, recent travel, or sick contacts.  Differential diagnosis includes but is not limited to chronic pharyngitis, GERD, head/neck cancer, chemical irritation of the oropharynx, thyroiditis Given symptoms of morning worsening of this pain as well as associated anterior cervical lymphadenopathy and recent ear infection, I have concerns that the symptoms may be related to her reflux  and will prescribe pantoprazole  for nightly use as well as instructions to follow-up with her primary care physician for resolution of the symptoms.  Dispo: Discharge home with PCP follow-up   FINAL CLINICAL IMPRESSION(S) / ED DIAGNOSES   Final diagnoses:  Sore throat  Swelling of throat   Rx / DC Orders   ED Discharge Orders          Ordered    pantoprazole  (PROTONIX ) 40 MG tablet  Daily        04/18/24 1308           Note:  This document was prepared using Dragon voice recognition software and may include unintentional dictation errors.   Merit Maybee K, MD 04/18/24 1318

## 2024-04-19 ENCOUNTER — Encounter: Payer: Self-pay | Admitting: Emergency Medicine

## 2024-04-19 ENCOUNTER — Inpatient Hospital Stay
Admission: EM | Admit: 2024-04-19 | Discharge: 2024-04-22 | DRG: 804 | Disposition: A | Discharge status: Home / Self Care | Attending: Internal Medicine | Admitting: Internal Medicine

## 2024-04-19 ENCOUNTER — Emergency Department

## 2024-04-19 DIAGNOSIS — R1310 Dysphagia, unspecified: Secondary | ICD-10-CM | POA: Diagnosis present

## 2024-04-19 DIAGNOSIS — J4520 Mild intermittent asthma, uncomplicated: Secondary | ICD-10-CM | POA: Diagnosis present

## 2024-04-19 DIAGNOSIS — Z6839 Body mass index (BMI) 39.0-39.9, adult: Secondary | ICD-10-CM

## 2024-04-19 DIAGNOSIS — B2700 Gammaherpesviral mononucleosis without complication: Secondary | ICD-10-CM | POA: Diagnosis present

## 2024-04-19 DIAGNOSIS — F17290 Nicotine dependence, other tobacco product, uncomplicated: Secondary | ICD-10-CM | POA: Diagnosis present

## 2024-04-19 DIAGNOSIS — Z79899 Other long term (current) drug therapy: Secondary | ICD-10-CM

## 2024-04-19 DIAGNOSIS — K219 Gastro-esophageal reflux disease without esophagitis: Secondary | ICD-10-CM | POA: Diagnosis present

## 2024-04-19 DIAGNOSIS — Z91013 Allergy to seafood: Secondary | ICD-10-CM

## 2024-04-19 DIAGNOSIS — L040 Acute lymphadenitis of face, head and neck: Principal | ICD-10-CM | POA: Diagnosis present

## 2024-04-19 DIAGNOSIS — J040 Acute laryngitis: Secondary | ICD-10-CM | POA: Diagnosis present

## 2024-04-19 DIAGNOSIS — I889 Nonspecific lymphadenitis, unspecified: Secondary | ICD-10-CM | POA: Diagnosis present

## 2024-04-19 DIAGNOSIS — Z9889 Other specified postprocedural states: Secondary | ICD-10-CM

## 2024-04-19 DIAGNOSIS — Z8249 Family history of ischemic heart disease and other diseases of the circulatory system: Secondary | ICD-10-CM

## 2024-04-19 DIAGNOSIS — Z87892 Personal history of anaphylaxis: Secondary | ICD-10-CM

## 2024-04-19 DIAGNOSIS — Z91018 Allergy to other foods: Secondary | ICD-10-CM

## 2024-04-19 DIAGNOSIS — Z833 Family history of diabetes mellitus: Secondary | ICD-10-CM

## 2024-04-19 DIAGNOSIS — I10 Essential (primary) hypertension: Secondary | ICD-10-CM | POA: Diagnosis present

## 2024-04-19 LAB — CBC WITH DIFFERENTIAL/PLATELET
Abs Immature Granulocytes: 0.09 K/uL — ABNORMAL HIGH (ref 0.00–0.07)
Basophils Absolute: 0 K/uL (ref 0.0–0.1)
Basophils Relative: 0 %
Eosinophils Absolute: 0.2 K/uL (ref 0.0–0.5)
Eosinophils Relative: 1 %
HCT: 36.7 % (ref 36.0–46.0)
Hemoglobin: 11.6 g/dL — ABNORMAL LOW (ref 12.0–15.0)
Immature Granulocytes: 1 %
Lymphocytes Relative: 15 %
Lymphs Abs: 2.5 K/uL (ref 0.7–4.0)
MCH: 29.3 pg (ref 26.0–34.0)
MCHC: 31.6 g/dL (ref 30.0–36.0)
MCV: 92.7 fL (ref 80.0–100.0)
Monocytes Absolute: 1.2 K/uL — ABNORMAL HIGH (ref 0.1–1.0)
Monocytes Relative: 7 %
Neutro Abs: 12.9 K/uL — ABNORMAL HIGH (ref 1.7–7.7)
Neutrophils Relative %: 76 %
Platelets: 433 K/uL — ABNORMAL HIGH (ref 150–400)
RBC: 3.96 MIL/uL (ref 3.87–5.11)
RDW: 13.3 % (ref 11.5–15.5)
WBC: 16.8 K/uL — ABNORMAL HIGH (ref 4.0–10.5)
nRBC: 0 % (ref 0.0–0.2)

## 2024-04-19 LAB — BASIC METABOLIC PANEL WITH GFR
Anion gap: 10 (ref 5–15)
BUN: 6 mg/dL (ref 6–20)
CO2: 23 mmol/L (ref 22–32)
Calcium: 9.3 mg/dL (ref 8.9–10.3)
Chloride: 106 mmol/L (ref 98–111)
Creatinine, Ser: 0.71 mg/dL (ref 0.44–1.00)
GFR, Estimated: >60 mL/min (ref >60)
Glucose, Bld: 117 mg/dL — ABNORMAL HIGH (ref 70–99)
Potassium: 3.5 mmol/L (ref 3.5–5.1)
Sodium: 139 mmol/L (ref 135–145)

## 2024-04-19 LAB — HIV ANTIBODY (ROUTINE TESTING W REFLEX): HIV Screen 4th Generation wRfx: NONREACTIVE

## 2024-04-19 LAB — GROUP A STREP BY PCR: Group A Strep by PCR: NOT DETECTED

## 2024-04-19 LAB — MONONUCLEOSIS SCREEN: Mono Screen: NEGATIVE

## 2024-04-19 MED ORDER — PHENOL 1.4 % MT LIQD: 1.0000 | OROMUCOSAL | Status: DC | PRN

## 2024-04-19 MED ORDER — MORPHINE SULFATE (PF) 4 MG/ML IV SOLN
4.0000 mg | Freq: Once | INTRAVENOUS | Status: AC
  Administered 2024-04-19: 4 mg via INTRAVENOUS
  Filled 2024-04-19: qty 1

## 2024-04-19 MED ORDER — VANCOMYCIN HCL 10 G IV SOLR: 1500.0000 mg | Freq: Once | INTRAVENOUS | Status: DC

## 2024-04-19 MED ORDER — ACETAMINOPHEN 325 MG PO TABS
650.0000 mg | ORAL_TABLET | Freq: Four times a day (QID) | ORAL | Status: DC | PRN
Start: 2024-04-19 — End: 2024-04-22

## 2024-04-19 MED ORDER — PANTOPRAZOLE SODIUM 40 MG PO TBEC
40.0000 mg | DELAYED_RELEASE_TABLET | Freq: Every day | ORAL | Status: DC
  Administered 2024-04-19 – 2024-04-21 (×2): 40 mg via ORAL
  Filled 2024-04-19 (×2): qty 1

## 2024-04-19 MED ORDER — ONDANSETRON HCL 4 MG/2ML IJ SOLN: 4.0000 mg | Freq: Once | INTRAMUSCULAR | Status: DC

## 2024-04-19 MED ORDER — PIPERACILLIN-TAZOBACTAM 3.375 G IVPB: 3.3750 g | Freq: Four times a day (QID) | INTRAVENOUS | Status: DC

## 2024-04-19 MED ORDER — VANCOMYCIN HCL 2000 MG/400ML IV SOLN
2000.0000 mg | Freq: Once | INTRAVENOUS | Status: AC
  Administered 2024-04-19: 2000 mg via INTRAVENOUS
  Filled 2024-04-19 (×3): qty 400

## 2024-04-19 MED ORDER — HYDROCODONE-ACETAMINOPHEN 5-325 MG PO TABS: 1.0000 | ORAL_TABLET | ORAL | Status: DC | PRN

## 2024-04-19 MED ORDER — ONDANSETRON HCL 4 MG PO TABS: 4.0000 mg | ORAL_TABLET | Freq: Four times a day (QID) | ORAL | Status: DC | PRN

## 2024-04-19 MED ORDER — ONDANSETRON HCL 4 MG/2ML IJ SOLN: 4.0000 mg | Freq: Four times a day (QID) | INTRAMUSCULAR | Status: DC | PRN

## 2024-04-19 MED ORDER — IOHEXOL 300 MG/ML  SOLN
75.0000 mL | Freq: Once | INTRAMUSCULAR | Status: AC | PRN
  Administered 2024-04-19: 75 mL via INTRAVENOUS

## 2024-04-19 MED ORDER — DEXAMETHASONE SODIUM PHOSPHATE 4 MG/ML IJ SOLN
4.0000 mg | Freq: Four times a day (QID) | INTRAMUSCULAR | Status: DC
  Administered 2024-04-19 – 2024-04-20 (×5): 4 mg via INTRAVENOUS
  Filled 2024-04-19 (×7): qty 1

## 2024-04-19 MED ORDER — PIPERACILLIN-TAZOBACTAM 3.375 G IVPB
3.3750 g | Freq: Three times a day (TID) | INTRAVENOUS | Status: DC
  Administered 2024-04-19 – 2024-04-22 (×9): 3.375 g via INTRAVENOUS
  Filled 2024-04-19 (×9): qty 50

## 2024-04-19 MED ORDER — MENTHOL 3 MG MT LOZG
1.0000 | LOZENGE | OROMUCOSAL | Status: DC | PRN
  Filled 2024-04-19: qty 9

## 2024-04-19 MED ORDER — DEXAMETHASONE SODIUM PHOSPHATE 10 MG/ML IJ SOLN
10.0000 mg | Freq: Once | INTRAMUSCULAR | Status: AC
  Administered 2024-04-19: 10 mg via INTRAVENOUS
  Filled 2024-04-19: qty 1

## 2024-04-19 MED ORDER — KETOROLAC TROMETHAMINE 15 MG/ML IJ SOLN
15.0000 mg | Freq: Once | INTRAMUSCULAR | Status: AC
  Administered 2024-04-19: 15 mg via INTRAVENOUS
  Filled 2024-04-19: qty 1

## 2024-04-19 MED ORDER — SENNOSIDES-DOCUSATE SODIUM 8.6-50 MG PO TABS: 1.0000 | ORAL_TABLET | Freq: Every evening | ORAL | Status: DC | PRN

## 2024-04-19 MED ORDER — DICYCLOMINE HCL 10 MG PO CAPS: 10.0000 mg | ORAL_CAPSULE | Freq: Four times a day (QID) | ORAL | Status: DC | PRN

## 2024-04-19 MED ORDER — MORPHINE SULFATE (PF) 4 MG/ML IV SOLN
4.0000 mg | INTRAVENOUS | Status: DC | PRN
  Administered 2024-04-19: 4 mg via INTRAVENOUS
  Filled 2024-04-19 (×2): qty 1

## 2024-04-19 MED ORDER — SODIUM CHLORIDE 0.9 % IV SOLN
2.0000 g | Freq: Once | INTRAVENOUS | Status: AC
  Administered 2024-04-19: 2 g via INTRAVENOUS
  Filled 2024-04-19: qty 20

## 2024-04-19 MED ORDER — ACETAMINOPHEN 650 MG RE SUPP
650.0000 mg | Freq: Four times a day (QID) | RECTAL | Status: DC | PRN
Start: 2024-04-19 — End: 2024-04-22

## 2024-04-19 NOTE — ED Triage Notes (Addendum)
 Patient ambulatory to triage with steady gait, without difficulty; pt reports being seen earlier for c/o sore throat/neck pain x mo; ibuprofen  400mg  taken PTA without relief; pt presents with large amount swelling to left side of neck which she st has worsened since being here earlier; st she did not undergo any testing or receive meds

## 2024-04-19 NOTE — ED Notes (Signed)
 MD made aware pt requesting numbing cough drops be ordered.

## 2024-04-19 NOTE — ED Provider Notes (Signed)
 [facility] Provider Note    Event Date/Time   First MD Initiated Contact with Patient 04/19/24 [phone]     (approximate)   History   Chief Complaint: neck pain  HPI  Brandi [name] is a 28 y.o. female with a past history of asthma GERD hypertension who comes ED complaining of severe pain in the neck, left greater than right.  Reports she has had sore throat for the past couple of months, been taking ibuprofen , amoxicillin , clarithromycin in conjunction with primary care follow-up.  Over the last few days has developed swelling in the neck.  Was seen yesterday, noted to have cervical lymphadenopathy.  Symptoms were suspected to be related to GERD and patient was discharged home, however she reports that symptoms are much worse today        Past Medical History:  Diagnosis Date   Asthma    GERD (gastroesophageal reflux disease)    HTN (hypertension)     Current Outpatient Rx   Order #: [id number] Class: Print   Order #: [id number] Class: Normal   Order #: [id number] Class: Normal   Order #: [id number] Class: Normal    Past Surgical History:  Procedure Laterality Date   dental procedure      Physical Exam   Triage Vital Signs: ED Triage Vitals  Encounter Vitals Group     BP 04/19/24 0412 (!) 161/96     Girls Systolic BP Percentile --      Girls Diastolic BP Percentile --      Boys Systolic BP Percentile --      Boys Diastolic BP Percentile --      Pulse Rate 04/19/24 0412 96     Resp 04/19/24 0412 18     Temp 04/19/24 0412 98.4 F (36.9 C)     Temp Source 04/19/24 0412 Oral     SpO2 04/19/24 0412 100 %     Weight 04/19/24 0406 217 lb (98.4 kg)     Height 04/19/24 0406 5' 2 (1.575 m)     Head Circumference --      Peak Flow --      Pain Score 04/19/24 0410 10     Pain Loc --      Pain Education --      Exclude from Growth Chart --     Most recent vital signs: Vitals:   04/19/24 0500 04/19/24 0625  BP: 134/81 (!) 136/93  Pulse: 79 92   Resp: 15 16  Temp:    SpO2: 100% 100%    General: Awake, no distress.  CV:  Good peripheral perfusion.  Regular rate rhythm Resp:  Normal effort.  Clear lungs, no stridor Abd:  No distention.  Other:  Pronounced bilateral submandibular lymphadenopathy, left greater than right with tenderness and firmness.  No significant trismus.  Airway is intact, no drooling.  No pharyngeal erythema or tonsillar asymmetry.   ED Results / Procedures / Treatments   Labs (all labs ordered are listed, but only abnormal results are displayed) Labs Reviewed  BASIC METABOLIC PANEL WITH GFR - Abnormal; Notable for the following components:      Result Value   Glucose, Bld 117 (*)    All other components within normal limits  CBC WITH DIFFERENTIAL/PLATELET - Abnormal; Notable for the following components:   WBC 16.8 (*)    Hemoglobin 11.6 (*)    Platelets 433 (*)    Neutro Abs 12.9 (*)    Monocytes Absolute 1.2 (*)    Abs Immature  Granulocytes 0.09 (*)    All other components within normal limits  GROUP A STREP BY PCR     EKG    RADIOLOGY CT neck pending   PROCEDURES:  Procedures   MEDICATIONS ORDERED IN ED: Medications  vancomycin  (VANCOREADY) IVPB 2000 mg/400 mL (has no administration in time range)  menthol -cetylpyridinium (CEPACOL) lozenge 3 mg (has no administration in time range)  ketorolac  (TORADOL ) 15 MG/ML injection 15 mg (15 mg Intravenous Given 04/19/24 0431)  dexamethasone  (DECADRON ) injection 10 mg (10 mg Intravenous Given 04/19/24 0431)  iohexol  (OMNIPAQUE ) 300 MG/ML solution 75 mL (75 mLs Intravenous Contrast Given 04/19/24 0519)  morphine  (PF) 4 MG/ML injection 4 mg (4 mg Intravenous Given 04/19/24 0554)  cefTRIAXone  (ROCEPHIN ) 2 g in sodium chloride  0.9 % 100 mL IVPB (0 g Intravenous Stopped 04/19/24 0654)     IMPRESSION / MDM / ASSESSMENT AND PLAN / ED COURSE  I reviewed the triage vital signs and the nursing notes.  DDx: Cervical lymphadenitis, PTA, sialoadenitis,  soft tissue neck abscess  Patient's presentation is most consistent with acute presentation with potential threat to life or bodily function.  Patient presents with bilateral neck pain and swelling.  Will obtain labs and CT neck.  Will give Toradol  and Decadron  for pain and inflammation relief.   Clinical Course as of 04/19/24 0657  Wed Apr 19, 2024  9368 CT shows extensive cervical lymphadenitis. No drainable abscess. Leukocytosis of 17k. Not septic. Airway intact. Will start iv abx and plan to admit [PS]    Clinical Course User Index [PS] Viviann Pastor, MD     FINAL CLINICAL IMPRESSION(S) / ED DIAGNOSES   Final diagnoses:  Acute cervical lymphadenitis     Rx / DC Orders   ED Discharge Orders     None        Note:  This document was prepared using Dragon voice recognition software and may include unintentional dictation errors.   Viviann Pastor, MD 04/19/24 [phone]

## 2024-04-19 NOTE — Consult Note (Signed)
 Chief Complaint: Acute cervical lymphadenitis  Referring Provider(s): Dr. Laurita  Supervising Physician: Karalee Beat  Patient Status: [facility] - In-pt  History of Present Illness: Brandi [name] is a 28 y.o. female with PMH significant for asthma and HTN who presented to the ED on 8/20 for increased L neck swelling/tenderness increased significantly from ED visit 8/19 when she presented for sore throat and neck soreness x1-2 months. Has been prescribed abx by PCP and EDP previously. Started as R ear infection.   IP MD consulted Dr. Blair with ENT who advises: The left nodes are more suppurative with 2 nodes demonstrating soft areas in the center. This is likely phlegmonous change which will respond to antibiotics but would recommend interventional radiology performing ultrasound-guided needle aspiration of the nodes to see if they get anything for culture.   There is available CT imaging: suppurative nodal mass present in the left level 2 nodal chain measuring 3.4 x 3.3 x 4.0 cm. There is also a left level 3 supportive node measuring 3.5 x 2.9 x 4.0 cm.   Does not wear CPAP or use supplemental home O2.  Denies fever, chills, SOB, CP, N/V, abd pain, blood in stool or urine, abnormal bruising, leg swelling. Endorses trouble swallowing, neck tenderness and swelling.   Allergies Reviewed:  Shellfish allergy   Patient is Full Code  Past Medical History:  Diagnosis Date   Asthma    GERD (gastroesophageal reflux disease)    HTN (hypertension)     Past Surgical History:  Procedure Laterality Date   dental procedure        Medications: Prior to Admission medications   Medication Sig Start Date End Date Taking? Authorizing Provider  pantoprazole  (PROTONIX ) 40 MG tablet Take 1 tablet (40 mg total) by mouth daily. 04/18/24 04/18/25 Yes Bradler, Evan K, MD  clindamycin (CLEOCIN) 300 MG capsule Take 300 mg by mouth every 6 (six) hours. Patient not taking: Reported on  04/19/2024 04/05/24   [provider]  dicyclomine  (BENTYL ) 10 MG capsule Take 1 capsule (10 mg total) by mouth every 6 (six) hours as needed for up to 7 days for spasms (abdominal pain). 12/04/19 12/11/19  Viviann Pastor, MD  Drospirenone  (SLYND ) 4 MG TABS Take 1 tablet (4 mg total) by mouth daily. Patient not taking: Reported on 04/19/2024 08/05/23   Izell Harari, MD  fluconazole  (DIFLUCAN ) 150 MG tablet Take 150 mg by mouth every 3 (three) days. Patient not taking: Reported on 04/19/2024 04/11/24   [provider]  promethazine  (PHENERGAN ) 12.5 MG tablet Take 1 tablet (12.5 mg total) by mouth every 6 (six) hours as needed for up to 5 days for nausea or vomiting. 02/29/24 03/05/24  Levander Slate, MD     Family History  Problem Relation Age of Onset   Hypertension Maternal Grandmother    Diabetes Maternal Grandmother     Social History   Socioeconomic History   Marital status: Significant Other    Spouse name: Not on file   Number of children: Not on file   Years of education: Not on file   Highest education level: Not on file  Occupational History   Not on file  Tobacco Use   Smoking status: Former    Types: E-cigarettes   Smokeless tobacco: Never  Vaping Use   Vaping status: Former  Substance and Sexual Activity   Alcohol use: Not Currently    Comment: social   Drug use: Yes    Types: Marijuana   Sexual activity:  Yes    Partners: Female    Birth control/protection: None  Other Topics Concern   Not on file  Social History Narrative   Not on file   Social Drivers of Health   Financial Resource Strain: Not on file  Food Insecurity: No Food Insecurity (03/01/2024)   Hunger Vital Sign    Worried About Running Out of Food in the Last Year: Never true    Ran Out of Food in the Last Year: Never true  Transportation Needs: No Transportation Needs (03/01/2024)   PRAPARE - Administrator, Civil Service (Medical): No    Lack of Transportation (Non-Medical):  No  Physical Activity: Not on file  Stress: Not on file  Social Connections: Not on file     Review of Systems: A 12 point ROS discussed and pertinent positives are indicated in the HPI above.  All other systems are negative.   Vital Signs: BP (!) 155/99   Pulse 85   Temp 98.2 F (36.8 C)   Resp 12   Ht 5' 2 (1.575 m)   Wt 217 lb (98.4 kg)   SpO2 100%   BMI 39.69 kg/m     Physical Exam HENT:     Mouth/Throat:     Mouth: Mucous membranes are moist.     Pharynx: Oropharynx is clear.  Neck:     Comments: Obvious L neck swelling Cardiovascular:     Rate and Rhythm: Normal rate and regular rhythm.     Pulses: Normal pulses.     Heart sounds: Normal heart sounds.  Pulmonary:     Effort: Pulmonary effort is normal.     Breath sounds: Normal breath sounds.  Musculoskeletal:     Cervical back: Tenderness present.  Lymphadenopathy:     Cervical: Cervical adenopathy present.  Skin:    General: Skin is warm and dry.  Neurological:     Mental Status: She is alert and oriented to person, place, and time.  Psychiatric:        Mood and Affect: Mood normal.        Behavior: Behavior normal.        Thought Content: Thought content normal.        Judgment: Judgment normal.     Imaging: CT Soft Tissue Neck W Contrast Result Date: 04/19/2024 EXAM: CT NECK WITH CONTRAST 04/19/2024 05:28:47 AM TECHNIQUE: CT of the neck was performed with the administration of intravenous contrast. Multiplanar reformatted images are provided for review. Automated exposure control, iterative reconstruction, and/or weight based adjustment of the mA/kV was utilized to reduce the radiation dose to as low as reasonably achievable. COMPARISON: None available. CLINICAL HISTORY: Soft tissue infection suspected, neck, xray done. Pt c/o sore throat/neck pain x mo; ibuprofen  400mg  taken PTA without relief; pt presents with large amount swelling to left side of neck which she st has worsened since being here  earlier. FINDINGS: AERODIGESTIVE TRACT: There is extensive nasopharyngeal and oropharyngeal soft tissue swelling and enhancement, which is more pronounced on the right. There is an ovoid area of nodular thickening and enhancement within the right nasopharynx on image 21 of series 2 measuring approximately 2.4 x 1.9 cm. There is a cyst on the left on the same image measuring approximately 11 x 7 mm. There is nodular enhancement along the right palatine tonsils on image 29, measuring approximately 2.4 x 2.1 cm. SALIVARY GLANDS: There are few submental and submandibular nodes. THYROID: Unremarkable. LYMPH NODES: There is a conglomerate nodal mass  present in the right level 2 and 3 nodal chains, measuring approximately 3.6 x 3.3 x 5.8 cm. There is also a suppurative nodal mass present in the left level 2 nodal chain measuring 3.4 x 3.3 x 4.0 cm. There is also a left level 3 supportive node measuring 3.5 x 2.9 x 4.0 cm. There are additional posterior cervical lymph nodes present bilaterally. SOFT TISSUES: There is adjacent soft tissue stranding. There are inflammatory changes along the left sternocleidomastoid muscle. BRAIN, ORBITS, SINUSES AND MASTOIDS: There is mild polypoid mucosal disease within the left maxillary sinus. LUNGS AND MEDIASTINUM: No acute abnormality. BONES: No focal bone abnormality. IMPRESSION: 1. Extensive nasopharyngeal and oropharyngeal soft tissue swelling and enhancement, more pronounced on the right, with nodular thickening and enhancement within the right nasopharynx and nodular enhancement along the right palatine tonsils. There is no drainable peritonsillar abscess present. 2. Conglomerate nodal mass in the right level 2 and 3 nodal chains and suppurative nodal mass in the left level 2 nodal chain, with additional supperative node in the left level 3 nodal chain. 3. Inflammatory changes along the left sternocleidomastoid muscle. 4. Retropharyngeal edema. 5. Mild polypoid mucosal disease  within the left maxillary sinus. Electronically signed by: Evalene Coho MD 04/19/2024 05:53 AM EDT RP Workstation: [id number]    Labs:  CBC: Recent Labs    02/29/24 0827 03/01/24 1237 03/02/24 0331 04/19/24 0428  WBC 19.1* 16.6* 18.5* 16.8*  HGB 12.7 12.6 11.5* 11.6*  HCT 38.3 37.5 34.5* 36.7  PLT 430* 400 366 433*    COAGS: No results for input(s): INR, APTT in the last 8760 hours.  BMP: Recent Labs    03/02/24 1756 03/04/24 0315 03/05/24 0500 04/19/24 0428  NA 138 136 133* 139  K 3.4* 3.3* 3.5 3.5  CL 104 104 101 106  CO2 22 21* 21* 23  GLUCOSE 107* 90 126* 117*  BUN 7 9 6 6   CALCIUM 8.8* 8.9 8.7* 9.3  CREATININE 0.73 0.60 0.70 0.71  GFRNONAA >60 >60 >60 >60    LIVER FUNCTION TESTS: Recent Labs    02/29/24 0827 03/01/24 1237  BILITOT 0.9 1.1  AST 21 24  ALT 18 20  ALKPHOS 63 59  PROT 7.9 7.7  ALBUMIN 3.9 3.8    TUMOR MARKERS: No results for input(s): AFPTM, CEA, CA199, CHROMGRNA in the last 8760 hours.  Assessment and Plan:  Request for  image guided L cervical LN aspiration reviewed and approved by Dr. Karalee for 8/21. Recommend holding the procedure for tomorrow so patient may receive moderate sedation given the exceptional tenderness associated with area.  No contraindications for procedure identified in ROS, physical exam, or review of pre-sedation considerations. She will be NPO at [facility].  WBC 16.8 8/20 CT soft tissue neck imaging available and reviewed VSS, afebrile Patient not asked to hold any AC/AP for this low bleeding risk procedure Abx to be managed by IP team    Risks and benefits of cervical LN aspiration for culture was discussed with the patient and/or patient's family including, but not limited to bleeding, infection, damage to adjacent structures or low yield requiring additional tests.  All of the questions were answered and there is agreement to proceed.  Consent signed and in chart.   Thank you for  allowing our service to participate in Brandi [name] 's care.    Electronically Signed: Laymon Coast, NP   04/19/2024, 12:32 PM     I spent a total of 20 Minutes  in face to face  in clinical consultation, greater than 50% of which was counseling/coordinating care for cervical LN aspiration.    (A copy of this note was sent to the referring provider and the time of visit.)

## 2024-04-19 NOTE — ED Notes (Signed)
 MD aware pt c/o nausea

## 2024-04-19 NOTE — H&P (Signed)
 History and Physical    Brandi [name] FMW:[id number] DOB: 08-01-1996 DOA: 04/19/2024  PCP: [name], MD (Confirm with patient/family/NH records and if not entered, this has to be entered at [facility] point of entry) Patient coming from: Home  I have personally briefly reviewed patient's old medical records in [facility]  Chief Complaint: Neck and throat hurts  HPI: Brandi [name] is a 28 y.o. female with medical history significant of mild intermittent asthma, HTN, morbid obesity, presented with worsening of neck and throat pain.  Symptoms started 4-5 weeks ago, patient started to have a pain in the right ear along with right-sided neck pain, she was diagnosed with right ear infection and was treated with first round of 7 days course of p.o. antibiotics.  The right ear pain subsided after the treatment however she continued to experience swelling of right-sided neck, gradually extended to left side of the neck associated with throat pain but there was no trouble swallowing or breathing and no stridors.  She denies any fever or chills.  She came back to ED 2 weeks ago, when she was given another 1 week antibiotics of clindamycin.  After taking 7 days course of clindamycin she continued to experience worsening of swelling and pain of both sides of the neck, right more than left side.  She started to cough up some clear phlegm but overall denied any trouble breathing or chest pains.  She was referred to see pulmonology by PCP for  possible lung infections.  She denied any chest pain abdominal pain dysuria or diarrhea.  No weight loss.  No sick contact.  ED Course: Afebrile, not tachycardia nonhypotensive not hypoxic.  CT soft tissue of neck showed extensive nasopharyngeal and oral pharyngeal soft tissue swelling and enhancement more pronounced in right side with nodular thickening and enhancement with right nasopharynx and nodular enhancement along the right palate teen tonsils.  Conglomerate  nodal mass in the right level 2 and 3 nodal chains and superactive nodal mass.  No drainable abscess presented.  Blood work showed WBC 16.8 strep PCR negative.  Review of Systems: As per HPI otherwise 14 point review of systems negative.    Past Medical History:  Diagnosis Date   Asthma    GERD (gastroesophageal reflux disease)    HTN (hypertension)     Past Surgical History:  Procedure Laterality Date   dental procedure       reports that she has quit smoking. Her smoking use included e-cigarettes. She has never used smokeless tobacco. She reports that she does not currently use alcohol. She reports current drug use. Drug: Marijuana.  Allergies  Allergen Reactions   Shellfish Allergy Anaphylaxis    Family History  Problem Relation Age of Onset   Hypertension Maternal Grandmother    Diabetes Maternal Grandmother      Prior to Admission medications   Medication Sig Start Date End Date Taking? Authorizing Provider  pantoprazole  (PROTONIX ) 40 MG tablet Take 1 tablet (40 mg total) by mouth daily. 04/18/24 04/18/25 Yes Bradler, Evan K, MD  clindamycin (CLEOCIN) 300 MG capsule Take 300 mg by mouth every 6 (six) hours. Patient not taking: Reported on 04/19/2024 04/05/24   [provider]  dicyclomine  (BENTYL ) 10 MG capsule Take 1 capsule (10 mg total) by mouth every 6 (six) hours as needed for up to 7 days for spasms (abdominal pain). 12/04/19 12/11/19  Viviann Pastor, MD  Drospirenone  (SLYND ) 4 MG TABS Take 1 tablet (4 mg total) by mouth daily. Patient  not taking: Reported on 04/19/2024 08/05/23   Izell Harari, MD  fluconazole  (DIFLUCAN ) 150 MG tablet Take 150 mg by mouth every 3 (three) days. Patient not taking: Reported on 04/19/2024 04/11/24   [provider]  promethazine  (PHENERGAN ) 12.5 MG tablet Take 1 tablet (12.5 mg total) by mouth every 6 (six) hours as needed for up to 5 days for nausea or vomiting. 02/29/24 03/05/24  Levander Slate, MD    Physical Exam: Vitals:    04/19/24 0500 04/19/24 0625 04/19/24 0700 04/19/24 0753  BP: 134/81 (!) 136/93 (!) 155/99   Pulse: 79 92 85   Resp: 15 16 12    Temp:    98.2 F (36.8 C)  TempSrc:      SpO2: 100% 100% 100%   Weight:      Height:        Constitutional: NAD, calm, comfortable Vitals:   04/19/24 0500 04/19/24 0625 04/19/24 0700 04/19/24 0753  BP: 134/81 (!) 136/93 (!) 155/99   Pulse: 79 92 85   Resp: 15 16 12    Temp:    98.2 F (36.8 C)  TempSrc:      SpO2: 100% 100% 100%   Weight:      Height:       Eyes: PERRL, lids and conjunctivae normal ENMT: Mucous membranes are moist. Posterior pharynx clear of any exudate or lesions.Normal dentition.  Neck: normal, supple, no masses, no thyromegaly.  Extensive lymphadenopathy with severe tenderness but not warm to touch compared to tissue surrounding Respiratory: clear to auscultation bilaterally, no wheezing, no crackles. Normal respiratory effort. No accessory muscle use.  Cardiovascular: Regular rate and rhythm, no murmurs / rubs / gallops. No extremity edema. 2+ pedal pulses. No carotid bruits.  Abdomen: no tenderness, no masses palpated. No hepatosplenomegaly. Bowel sounds positive.  Musculoskeletal: no clubbing / cyanosis. No joint deformity upper and lower extremities. Good ROM, no contractures. Normal muscle tone.  Skin: no rashes, lesions, ulcers. No induration Neurologic: CN 2-12 grossly intact. Sensation intact, DTR normal. Strength 5/5 in all 4.  Psychiatric: Normal judgment and insight. Alert and oriented x 3. Normal mood.     Labs on Admission: I have personally reviewed following labs and imaging studies  CBC: Recent Labs  Lab 04/19/24 0428  WBC 16.8*  NEUTROABS 12.9*  HGB 11.6*  HCT 36.7  MCV 92.7  PLT 433*   Basic Metabolic Panel: Recent Labs  Lab 04/19/24 0428  NA 139  K 3.5  CL 106  CO2 23  GLUCOSE 117*  BUN 6  CREATININE 0.71  CALCIUM 9.3   GFR: Estimated Creatinine Clearance: 114.7 mL/min (by C-G formula  based on SCr of 0.71 mg/dL). Liver Function Tests: No results for input(s): AST, ALT, ALKPHOS, BILITOT, PROT, ALBUMIN in the last 168 hours. No results for input(s): LIPASE, AMYLASE in the last 168 hours. No results for input(s): AMMONIA in the last 168 hours. Coagulation Profile: No results for input(s): INR, PROTIME in the last 168 hours. Cardiac Enzymes: No results for input(s): CKTOTAL, CKMB, CKMBINDEX, TROPONINI in the last 168 hours. BNP (last 3 results) No results for input(s): PROBNP in the last 8760 hours. HbA1C: No results for input(s): HGBA1C in the last 72 hours. CBG: No results for input(s): GLUCAP in the last 168 hours. Lipid Profile: No results for input(s): CHOL, HDL, LDLCALC, TRIG, CHOLHDL, LDLDIRECT in the last 72 hours. Thyroid Function Tests: No results for input(s): TSH, T4TOTAL, FREET4, T3FREE, THYROIDAB in the last 72 hours. Anemia Panel: No results for  input(s): VITAMINB12, FOLATE, FERRITIN, TIBC, IRON, RETICCTPCT in the last 72 hours. Urine analysis:    Component Value Date/Time   COLORURINE YELLOW (A) 12/28/2022 1540   APPEARANCEUR HAZY (A) 12/28/2022 1540   LABSPEC 1.012 12/28/2022 1540   PHURINE 7.0 12/28/2022 1540   GLUCOSEU NEGATIVE 12/28/2022 1540   HGBUR MODERATE (A) 12/28/2022 1540   BILIRUBINUR NEGATIVE 12/28/2022 1540   KETONESUR NEGATIVE 12/28/2022 1540   PROTEINUR NEGATIVE 12/28/2022 1540   UROBILINOGEN 1.0 05/20/2015 1230   NITRITE NEGATIVE 12/28/2022 1540   LEUKOCYTESUR LARGE (A) 12/28/2022 1540    Radiological Exams on Admission: CT Soft Tissue Neck W Contrast Result Date: 04/19/2024 EXAM: CT NECK WITH CONTRAST 04/19/2024 05:28:47 AM TECHNIQUE: CT of the neck was performed with the administration of intravenous contrast. Multiplanar reformatted images are provided for review. Automated exposure control, iterative reconstruction, and/or weight based adjustment of the  mA/kV was utilized to reduce the radiation dose to as low as reasonably achievable. COMPARISON: None available. CLINICAL HISTORY: Soft tissue infection suspected, neck, xray done. Pt c/o sore throat/neck pain x mo; ibuprofen  400mg  taken PTA without relief; pt presents with large amount swelling to left side of neck which she st has worsened since being here earlier. FINDINGS: AERODIGESTIVE TRACT: There is extensive nasopharyngeal and oropharyngeal soft tissue swelling and enhancement, which is more pronounced on the right. There is an ovoid area of nodular thickening and enhancement within the right nasopharynx on image 21 of series 2 measuring approximately 2.4 x 1.9 cm. There is a cyst on the left on the same image measuring approximately 11 x 7 mm. There is nodular enhancement along the right palatine tonsils on image 29, measuring approximately 2.4 x 2.1 cm. SALIVARY GLANDS: There are few submental and submandibular nodes. THYROID: Unremarkable. LYMPH NODES: There is a conglomerate nodal mass present in the right level 2 and 3 nodal chains, measuring approximately 3.6 x 3.3 x 5.8 cm. There is also a suppurative nodal mass present in the left level 2 nodal chain measuring 3.4 x 3.3 x 4.0 cm. There is also a left level 3 supportive node measuring 3.5 x 2.9 x 4.0 cm. There are additional posterior cervical lymph nodes present bilaterally. SOFT TISSUES: There is adjacent soft tissue stranding. There are inflammatory changes along the left sternocleidomastoid muscle. BRAIN, ORBITS, SINUSES AND MASTOIDS: There is mild polypoid mucosal disease within the left maxillary sinus. LUNGS AND MEDIASTINUM: No acute abnormality. BONES: No focal bone abnormality. IMPRESSION: 1. Extensive nasopharyngeal and oropharyngeal soft tissue swelling and enhancement, more pronounced on the right, with nodular thickening and enhancement within the right nasopharynx and nodular enhancement along the right palatine tonsils. There is no  drainable peritonsillar abscess present. 2. Conglomerate nodal mass in the right level 2 and 3 nodal chains and suppurative nodal mass in the left level 2 nodal chain, with additional supperative node in the left level 3 nodal chain. 3. Inflammatory changes along the left sternocleidomastoid muscle. 4. Retropharyngeal edema. 5. Mild polypoid mucosal disease within the left maxillary sinus. Electronically signed by: Evalene Coho MD 04/19/2024 05:53 AM EDT RP Workstation: [id number]    EKG: None  Assessment/Plan Principal Problem:   Acute cervical lymphadenitis  (please populate well all problems here in Problem List. (For example, if patient is on BP meds at home and you resume or decide to hold them, it is a problem that needs to be her. Same for CAD, COPD, HLD and so on)   Acute cervical lymphadenitis Acute laryngitis - Failed outpatient  antibiotic treatment x 2 -Clinically, there is no compromise of breathing or swallowing. - Case was discussed with on-call ENT Dr.,Bennett, who reviewed the CT neck soft tissue and recommended [place] and IV antibiotics, Decadron  and IR guided drainage of left suppurative cervical nodes.  And strep A culture. - Continue Zosyn , start Decadron  - Supportive care  Morbid obesity -BMI= 39 - Outpatient follow-up with PCP for sleep study  DVT prophylaxis: SCD Code Status: Full code Family Communication: None at bedside Disposition Plan: Expect less than 2 midnight hospital stay Consults called: ENT and IR Admission status: MedSurg observation   Cort ONEIDA Mana MD [facility] Pager [phone]  04/19/2024, 9:03 AM

## 2024-04-19 NOTE — ED Notes (Signed)
 Pt presented to ED with c/o bilateral neck swelling x 1 month. States worsening overnight, woke up at 3am and left side was significantly more swollen and painful. Pt states difficult to swallow, difficulty breathing, unable to move neck due to swelling.

## 2024-04-19 NOTE — Consult Note (Signed)
 Brandi [name], Brandi [name] [id number] 1996/01/05 Deward GORMAN Dolly, MD  Reason for Consult: Cervical lymphadenopathy Requesting Physician: Laurita Cort DASEN, MD Consulting Physician: Deward GORMAN Dolly, MD  HPI: This 28 y.o. year old female was admitted on 04/19/2024 for Acute cervical lymphadenitis [L04.0]. Symptoms started 4-5 weeks ago, patient started to have a pain in the right ear along with right-sided neck pain, she was diagnosed with right ear infection and was treated with first round of 7 days course of p.o. antibiotics.  The right ear pain subsided after the treatment however she continued to experience swelling of right-sided neck, gradually extended to left side of the neck associated with throat pain but there was no trouble swallowing or breathing and no stridor.  She denies any fever or chills.  She came back to ED 2 weeks ago, when she was given another 1 week antibiotics of clindamycin, and apparently was also given some prednisone  at 1 point.  After taking 7 days course of clindamycin she said the swelling improved, but then worsened after completing the antibiotic with swelling and pain of both sides of the neck, right more than left side.  She started to cough up some clear phlegm but overall denied any trouble breathing or chest pains.  She was referred to see pulmonology by PCP for  possible lung infections.  She denied any chest pain abdominal pain dysuria or diarrhea.  No weight loss.  No sick contact.   Medications:  Current Facility-Administered Medications  Medication Dose Route Frequency Provider Last Rate Last Admin   acetaminophen  (TYLENOL ) tablet 650 mg  650 mg Oral Q6H PRN Laurita Cort DASEN, MD       Or   acetaminophen  (TYLENOL ) suppository 650 mg  650 mg Rectal Q6H PRN Laurita Cort DASEN, MD       dexamethasone  (DECADRON ) injection 4 mg  4 mg Intravenous Q6H Laurita Cort DASEN, MD       dicyclomine  (BENTYL ) capsule 10 mg  10 mg Oral Q6H PRN Laurita Cort DASEN, MD       HYDROcodone -acetaminophen   (NORCO/VICODIN) 5-325 MG per tablet 1-2 tablet  1-2 tablet Oral Q4H PRN Laurita Cort DASEN, MD       menthol -cetylpyridinium (CEPACOL) lozenge 3 mg  1 lozenge Oral PRN Viviann Pastor, MD       ondansetron  (ZOFRAN ) injection 4 mg  4 mg Intravenous Once Viviann Pastor, MD       ondansetron  (ZOFRAN ) tablet 4 mg  4 mg Oral Q6H PRN Laurita Cort DASEN, MD       Or   ondansetron  (ZOFRAN ) injection 4 mg  4 mg Intravenous Q6H PRN Laurita Cort DASEN, MD       pantoprazole  (PROTONIX ) EC tablet 40 mg  40 mg Oral Daily Laurita Cort T, MD   40 mg at 04/19/24 1006   phenol (CHLORASEPTIC) mouth spray 1 spray  1 spray Mouth/Throat PRN Laurita Cort DASEN, MD       piperacillin -tazobactam (ZOSYN ) IVPB 3.375 g  3.375 g Intravenous Q8H Hunt, Madison H, RPH 12.5 mL/hr at 04/19/24 0934 3.375 g at 04/19/24 0934   senna-docusate (Senokot-S) tablet 1 tablet  1 tablet Oral QHS PRN Laurita Cort DASEN, MD       Current Outpatient Medications  Medication Sig Dispense Refill   pantoprazole  (PROTONIX ) 40 MG tablet Take 1 tablet (40 mg total) by mouth daily. 30 tablet 11   clindamycin (CLEOCIN) 300 MG capsule Take 300 mg by mouth every 6 (six) hours. (Patient not taking: Reported on 04/19/2024)  dicyclomine  (BENTYL ) 10 MG capsule Take 1 capsule (10 mg total) by mouth every 6 (six) hours as needed for up to 7 days for spasms (abdominal pain). 20 capsule 0   Drospirenone  (SLYND ) 4 MG TABS Take 1 tablet (4 mg total) by mouth daily. (Patient not taking: Reported on 04/19/2024) 180 tablet 1   fluconazole  (DIFLUCAN ) 150 MG tablet Take 150 mg by mouth every 3 (three) days. (Patient not taking: Reported on 04/19/2024)     promethazine  (PHENERGAN ) 12.5 MG tablet Take 1 tablet (12.5 mg total) by mouth every 6 (six) hours as needed for up to 5 days for nausea or vomiting. 20 tablet 0  . (Not in a hospital admission)   Allergies:  Allergies  Allergen Reactions   Shellfish Allergy Anaphylaxis    PMH:  Past Medical History:  Diagnosis Date   Asthma     GERD (gastroesophageal reflux disease)    HTN (hypertension)     Fam Hx:  Family History  Problem Relation Age of Onset   Hypertension Maternal Grandmother    Diabetes Maternal Grandmother     Soc Hx:  Social History   Socioeconomic History   Marital status: Significant Other    Spouse name: Not on file   Number of children: Not on file   Years of education: Not on file   Highest education level: Not on file  Occupational History   Not on file  Tobacco Use   Smoking status: Former    Types: E-cigarettes   Smokeless tobacco: Never  Vaping Use   Vaping status: Former  Substance and Sexual Activity   Alcohol use: Not Currently    Comment: social   Drug use: Yes    Types: Marijuana   Sexual activity: Yes    Partners: Female    Birth control/protection: None  Other Topics Concern   Not on file  Social History Narrative   Not on file   Social Drivers of Health   Financial Resource Strain: Not on file  Food Insecurity: No Food Insecurity (03/01/2024)   Hunger Vital Sign    Worried About Running Out of Food in the Last Year: Never true    Ran Out of Food in the Last Year: Never true  Transportation Needs: No Transportation Needs (03/01/2024)   PRAPARE - Administrator, Civil Service (Medical): No    Lack of Transportation (Non-Medical): No  Physical Activity: Not on file  Stress: Not on file  Social Connections: Not on file  Intimate Partner Violence: Not At Risk (03/01/2024)   Humiliation, Afraid, Rape, and Kick questionnaire    Fear of Current or Ex-Partner: No    Emotionally Abused: No    Physically Abused: No    Sexually Abused: No    PSH:  Past Surgical History:  Procedure Laterality Date   dental procedure    . Procedures since admission: No admission procedures for hospital encounter.  ROS: Review of systems normal other than 12 systems except per HPI.  PHYSICAL EXAM  Vitals: Blood pressure (!) 155/99, pulse 85, temperature 98.2 F (36.8  C), resp. rate 12, height 5' 2 (1.575 m), weight 98.4 kg, SpO2 100%.. General: Well-developed, Well-nourished in no acute distress Mood: Mood and affect well adjusted, pleasant and cooperative. Orientation: Grossly alert and oriented. Vocal Quality: No hoarseness. Communicates verbally. head and Face: NCAT. No facial asymmetry. No visible skin lesions. No significant facial scars. No tenderness with sinus percussion. Facial strength normal and symmetric. Ears: External ears  with normal landmarks, no lesions. External auditory canals free of infection, cerumen impaction or lesions. Tympanic membranes intact with good landmarks and normal mobility on pneumatic otoscopy. No middle ear effusion. Hearing: Speech reception grossly normal. Nose: External nose normal with midline dorsum and no lesions or deformity. Nasal Cavity reveals essentially midline septum with normal inferior turbinates. No significant mucosal congestion or erythema. Nasal secretions are minimal and clear. No polyps seen on anterior rhinoscopy. Oral Cavity/ Oropharynx: Lips are normal with no lesions. Teeth no frank dental caries. Gingiva healthy with no lesions or gingivitis. Oropharynx including tongue, buccal mucosa, floor of mouth, hard and soft palate, uvula and posterior pharynx free of exudates, erythema or lesions with normal symmetry and hydration.  Tonsils are 2+ and cryptic with no evidence of peritonsillar abscess.  No exudates. Indirect Laryngoscopy/Nasopharyngoscopy: Visualization of the larynx, hypopharynx and nasopharynx is not possible in this setting with routine examination. Neck: Supple and symmetric with no palpable masses, tenderness or crepitance. The trachea is midline. Thyroid gland is soft, nontender and symmetric with no masses or enlargement. Parotid and submandibular glands are soft, nontender and symmetric, without masses. Lymphatic: Cervical lymph nodes enlarged bilaterally, much more prominent on the left  and tender to touch.  No crepitance.  No erythema of the overlying skin. Respiratory: Normal respiratory effort without labored breathing. Cardiovascular: Carotid pulse shows regular rate and rhythm Neurologic: Cranial Nerves II through XII are grossly intact. Eyes: Gaze and Ocular Motility are grossly normal. PERRLA. No visible nystagmus.  MEDICAL DECISION MAKING: Data Review:  Results for orders placed or performed during the hospital encounter of 04/19/24 (from the past 48 hours)  Basic metabolic panel     Status: Abnormal   Collection Time: 04/19/24  4:28 AM  Result Value Ref Range   Sodium 139 135 - 145 mmol/L   Potassium 3.5 3.5 - 5.1 mmol/L   Chloride 106 98 - 111 mmol/L   CO2 23 22 - 32 mmol/L   Glucose, Bld 117 (H) 70 - 99 mg/dL    Comment: Glucose reference range applies only to samples taken after fasting for at least 8 hours.   BUN 6 6 - 20 mg/dL   Creatinine, Ser 9.28 0.44 - 1.00 mg/dL   Calcium 9.3 8.9 - 89.6 mg/dL   GFR, Estimated >39 >39 mL/min    Comment: (NOTE) Calculated using the CKD-EPI Creatinine Equation (2021)    Anion gap 10 5 - 15    Comment: Performed at [facility], [address] [place], [place] [address]  CBC with Differential     Status: Abnormal   Collection Time: 04/19/24  4:28 AM  Result Value Ref Range   WBC 16.8 (H) 4.0 - 10.5 K/uL   RBC 3.96 3.87 - 5.11 MIL/uL   Hemoglobin 11.6 (L) 12.0 - 15.0 g/dL   HCT 63.2 63.9 - 53.9 %   MCV 92.7 80.0 - 100.0 fL   MCH 29.3 26.0 - 34.0 pg   MCHC 31.6 30.0 - 36.0 g/dL   RDW 86.6 88.4 - 84.4 %   Platelets 433 (H) 150 - 400 K/uL   nRBC 0.0 0.0 - 0.2 %   Neutrophils Relative % 76 %   Neutro Abs 12.9 (H) 1.7 - 7.7 K/uL   Lymphocytes Relative 15 %   Lymphs Abs 2.5 0.7 - 4.0 K/uL   Monocytes Relative 7 %   Monocytes Absolute 1.2 (H) 0.1 - 1.0 K/uL   Eosinophils Relative 1 %   Eosinophils Absolute 0.2  0.0 - 0.5 K/uL   Basophils Relative 0 %   Basophils Absolute 0.0 0.0 - 0.1 K/uL    Immature Granulocytes 1 %   Abs Immature Granulocytes 0.09 (H) 0.00 - 0.07 K/uL    Comment: Performed at [facility], [address] [place], [place] [address]  Group A Strep by PCR [place] Only)     Status: None   Collection Time: 04/19/24  4:30 AM   Specimen: Throat; Sterile Swab  Result Value Ref Range   Group A Strep by PCR NOT DETECTED NOT DETECTED    Comment: Performed at [facility], [address], [place], [place] [address]  Mononucleosis screen     Status: None   Collection Time: 04/19/24  9:20 AM  Result Value Ref Range   Mono Screen NEGATIVE NEGATIVE    Comment: Performed at [facility], [address] [place], [place] [address]  . CT Soft Tissue Neck W Contrast Result Date: 04/19/2024 EXAM: CT NECK WITH CONTRAST 04/19/2024 05:28:47 AM TECHNIQUE: CT of the neck was performed with the administration of intravenous contrast. Multiplanar reformatted images are provided for review. Automated exposure control, iterative reconstruction, and/or weight based adjustment of the mA/kV was utilized to reduce the radiation dose to as low as reasonably achievable. COMPARISON: None available. CLINICAL HISTORY: Soft tissue infection suspected, neck, xray done. Pt c/o sore throat/neck pain x mo; ibuprofen  400mg  taken PTA without relief; pt presents with large amount swelling to left side of neck which she st has worsened since being here earlier. FINDINGS: AERODIGESTIVE TRACT: There is extensive nasopharyngeal and oropharyngeal soft tissue swelling and enhancement, which is more pronounced on the right. There is an ovoid area of nodular thickening and enhancement within the right nasopharynx on image 21 of series 2 measuring approximately 2.4 x 1.9 cm. There is a cyst on the left on the same image measuring approximately 11 x 7 mm. There is nodular enhancement along the right palatine tonsils on image 29, measuring approximately 2.4 x 2.1 cm. SALIVARY GLANDS: There are few  submental and submandibular nodes. THYROID: Unremarkable. LYMPH NODES: There is a conglomerate nodal mass present in the right level 2 and 3 nodal chains, measuring approximately 3.6 x 3.3 x 5.8 cm. There is also a suppurative nodal mass present in the left level 2 nodal chain measuring 3.4 x 3.3 x 4.0 cm. There is also a left level 3 supportive node measuring 3.5 x 2.9 x 4.0 cm. There are additional posterior cervical lymph nodes present bilaterally. SOFT TISSUES: There is adjacent soft tissue stranding. There are inflammatory changes along the left sternocleidomastoid muscle. BRAIN, ORBITS, SINUSES AND MASTOIDS: There is mild polypoid mucosal disease within the left maxillary sinus. LUNGS AND MEDIASTINUM: No acute abnormality. BONES: No focal bone abnormality. IMPRESSION: 1. Extensive nasopharyngeal and oropharyngeal soft tissue swelling and enhancement, more pronounced on the right, with nodular thickening and enhancement within the right nasopharynx and nodular enhancement along the right palatine tonsils. There is no drainable peritonsillar abscess present. 2. Conglomerate nodal mass in the right level 2 and 3 nodal chains and suppurative nodal mass in the left level 2 nodal chain, with additional supperative node in the left level 3 nodal chain. 3. Inflammatory changes along the left sternocleidomastoid muscle. 4. Retropharyngeal edema. 5. Mild polypoid mucosal disease within the left maxillary sinus. Electronically signed by: Evalene Coho MD 04/19/2024 05:53 AM EDT RP Workstation: [id number]  .   ASSESSMENT: Bilateral cervical lymphadenitis.  It sounds like there was  some suspicion of tonsillitis during the course of her management originally, not just an ear infection.  Ears are completely clear on CT imaging.  The left nodes are more suppurative with 2 nodes demonstrating soft areas in the center.  This is likely phlegmonous change which will respond to antibiotics but would recommend interventional  radiology performing ultrasound-guided needle aspiration of the nodes to see if they get anything for culture.  Strep testing negative.  When I discussed with the hospitalist this morning I did recommend getting a formal throat culture.  At this point she is on antibiotics so findings might be limited.  I also recommended mono testing.  Monospot screen was negative but I still have a strong suspicion for mono given the bulky lymphadenopathy, and this late in the course a false negative monoscreen is more likely.  EBV titers with IgM and IgG levels would be more accurate but will take days to get back as it is a send out lab. PLAN: Recommend continuing the Zosyn  and Decadron  to reduce swelling.  She has already noted some improvement in swelling.  Hopefully interventional radiology can try aspirating the nodes and seeing if any culture can be obtained as well as draining any bit of fluid within them.  As long as she shows continued improvement I see no need for surgical incision and drainage.   Deward GORMAN Dolly, MD 04/19/2024 11:49 AM

## 2024-04-19 NOTE — ED Notes (Signed)
Stafford MD at bedside 

## 2024-04-20 ENCOUNTER — Observation Stay

## 2024-04-20 DIAGNOSIS — Z91018 Allergy to other foods: Secondary | ICD-10-CM | POA: Diagnosis not present

## 2024-04-20 DIAGNOSIS — Z6839 Body mass index (BMI) 39.0-39.9, adult: Secondary | ICD-10-CM | POA: Diagnosis not present

## 2024-04-20 DIAGNOSIS — B2700 Gammaherpesviral mononucleosis without complication: Secondary | ICD-10-CM | POA: Diagnosis present

## 2024-04-20 DIAGNOSIS — J4520 Mild intermittent asthma, uncomplicated: Secondary | ICD-10-CM | POA: Diagnosis present

## 2024-04-20 DIAGNOSIS — R1310 Dysphagia, unspecified: Secondary | ICD-10-CM | POA: Diagnosis present

## 2024-04-20 DIAGNOSIS — Z9889 Other specified postprocedural states: Secondary | ICD-10-CM | POA: Diagnosis not present

## 2024-04-20 DIAGNOSIS — L040 Acute lymphadenitis of face, head and neck: Secondary | ICD-10-CM | POA: Diagnosis present

## 2024-04-20 DIAGNOSIS — F17290 Nicotine dependence, other tobacco product, uncomplicated: Secondary | ICD-10-CM | POA: Diagnosis present

## 2024-04-20 DIAGNOSIS — I889 Nonspecific lymphadenitis, unspecified: Secondary | ICD-10-CM | POA: Diagnosis present

## 2024-04-20 DIAGNOSIS — Z833 Family history of diabetes mellitus: Secondary | ICD-10-CM | POA: Diagnosis not present

## 2024-04-20 DIAGNOSIS — K219 Gastro-esophageal reflux disease without esophagitis: Secondary | ICD-10-CM | POA: Diagnosis present

## 2024-04-20 DIAGNOSIS — Z8249 Family history of ischemic heart disease and other diseases of the circulatory system: Secondary | ICD-10-CM | POA: Diagnosis not present

## 2024-04-20 DIAGNOSIS — Z79899 Other long term (current) drug therapy: Secondary | ICD-10-CM | POA: Diagnosis not present

## 2024-04-20 DIAGNOSIS — Z91013 Allergy to seafood: Secondary | ICD-10-CM | POA: Diagnosis not present

## 2024-04-20 DIAGNOSIS — J040 Acute laryngitis: Secondary | ICD-10-CM | POA: Diagnosis present

## 2024-04-20 DIAGNOSIS — Z87892 Personal history of anaphylaxis: Secondary | ICD-10-CM | POA: Diagnosis not present

## 2024-04-20 DIAGNOSIS — I10 Essential (primary) hypertension: Secondary | ICD-10-CM | POA: Diagnosis present

## 2024-04-20 LAB — CBC WITH DIFFERENTIAL/PLATELET
Abs Immature Granulocytes: 0.14 K/uL — ABNORMAL HIGH (ref 0.00–0.07)
Basophils Absolute: 0 K/uL (ref 0.0–0.1)
Basophils Relative: 0 %
Eosinophils Absolute: 0.1 K/uL (ref 0.0–0.5)
Eosinophils Relative: 0 %
HCT: 35.9 % — ABNORMAL LOW (ref 36.0–46.0)
Hemoglobin: 11.4 g/dL — ABNORMAL LOW (ref 12.0–15.0)
Immature Granulocytes: 1 %
Lymphocytes Relative: 5 %
Lymphs Abs: 1.1 K/uL (ref 0.7–4.0)
MCH: 29 pg (ref 26.0–34.0)
MCHC: 31.8 g/dL (ref 30.0–36.0)
MCV: 91.3 fL (ref 80.0–100.0)
Monocytes Absolute: 0.4 K/uL (ref 0.1–1.0)
Monocytes Relative: 2 %
Neutro Abs: 19.3 K/uL — ABNORMAL HIGH (ref 1.7–7.7)
Neutrophils Relative %: 92 %
Platelets: 438 K/uL — ABNORMAL HIGH (ref 150–400)
RBC: 3.93 MIL/uL (ref 3.87–5.11)
RDW: 13.7 % (ref 11.5–15.5)
WBC: 21.1 K/uL — ABNORMAL HIGH (ref 4.0–10.5)
nRBC: 0 % (ref 0.0–0.2)

## 2024-04-20 LAB — EBV AB TO VIRAL CAPSID AG PNL, IGG+IGM
EBV VCA IgG: >600 U/mL — ABNORMAL HIGH (ref 0.0–17.9)
EBV VCA IgM: <36 U/mL (ref 0.0–35.9)

## 2024-04-20 LAB — BASIC METABOLIC PANEL WITH GFR
Anion gap: 11 (ref 5–15)
BUN: 7 mg/dL (ref 6–20)
CO2: 22 mmol/L (ref 22–32)
Calcium: 9.3 mg/dL (ref 8.9–10.3)
Chloride: 105 mmol/L (ref 98–111)
Creatinine, Ser: 0.61 mg/dL (ref 0.44–1.00)
GFR, Estimated: >60 mL/min (ref >60)
Glucose, Bld: 131 mg/dL — ABNORMAL HIGH (ref 70–99)
Potassium: 3.6 mmol/L (ref 3.5–5.1)
Sodium: 138 mmol/L (ref 135–145)

## 2024-04-20 LAB — PROTIME-INR
INR: 1.1 (ref 0.8–1.2)
Prothrombin Time: 15.2 s (ref 11.4–15.2)

## 2024-04-20 MED ORDER — FENTANYL CITRATE (PF) 100 MCG/2ML IJ SOLN
INTRAMUSCULAR | Status: AC
Start: 2024-04-20 — End: 2024-04-20
  Filled 2024-04-20: qty 2

## 2024-04-20 MED ORDER — FENTANYL CITRATE (PF) 100 MCG/2ML IJ SOLN
INTRAMUSCULAR | Status: AC | PRN
  Administered 2024-04-20 (×2): 50 ug via INTRAVENOUS

## 2024-04-20 MED ORDER — MIDAZOLAM HCL 2 MG/2ML IJ SOLN
INTRAMUSCULAR | Status: AC | PRN
  Administered 2024-04-20 (×2): 1 mg via INTRAVENOUS

## 2024-04-20 MED ORDER — DEXAMETHASONE SODIUM PHOSPHATE 4 MG/ML IJ SOLN
4.0000 mg | Freq: Three times a day (TID) | INTRAMUSCULAR | Status: DC
  Administered 2024-04-21 – 2024-04-22 (×4): 4 mg via INTRAVENOUS
  Filled 2024-04-20 (×5): qty 1

## 2024-04-20 MED ORDER — LIDOCAINE HCL (PF) 1 % IJ SOLN
7.0000 mL | Freq: Once | INTRAMUSCULAR | Status: AC
  Administered 2024-04-20: 7 mL via INTRADERMAL
  Filled 2024-04-20: qty 8

## 2024-04-20 MED ORDER — ALPRAZOLAM 0.25 MG PO TABS
0.2500 mg | ORAL_TABLET | Freq: Two times a day (BID) | ORAL | Status: DC | PRN
  Administered 2024-04-20: 0.25 mg via ORAL
  Filled 2024-04-20 (×2): qty 1

## 2024-04-20 MED ORDER — MIDAZOLAM HCL 2 MG/2ML IJ SOLN
INTRAMUSCULAR | Status: AC
  Filled 2024-04-20: qty 4

## 2024-04-20 NOTE — Progress Notes (Signed)
 Patient clinically stable post US  cervical left LN aspiration per Dr Luverne, tolerated well. Vitals stable pre and post procedure. Denies complaints post procedure. Received Versed  2 mg along with Fentanyl  100 mcg IV for procedure. Report given to Donny Cedar RN post procedure/specials/9

## 2024-04-20 NOTE — Progress Notes (Signed)
 PROGRESS NOTE    Brandi [name]  FMW:[id number] DOB: 05-25-1996 DOA: 04/19/2024 PCP: [name], MD    Brief Narrative:   28 y.o. female with medical history significant of mild intermittent asthma, HTN, morbid obesity, presented with worsening of neck and throat pain.   Symptoms started 4-5 weeks ago, patient started to have a pain in the right ear along with right-sided neck pain, she was diagnosed with right ear infection and was treated with first round of 7 days course of p.o. antibiotics.  The right ear pain subsided after the treatment however she continued to experience swelling of right-sided neck, gradually extended to left side of the neck associated with throat pain but there was no trouble swallowing or breathing and no stridors.  She denies any fever or chills.  She came back to ED 2 weeks ago, when she was given another 1 week antibiotics of clindamycin.  After taking 7 days course of clindamycin she continued to experience worsening of swelling and pain of both sides of the neck, right more than left side.  She started to cough up some clear phlegm but overall denied any trouble breathing or chest pains.  She was referred to see pulmonology by PCP for  possible lung infections.  She denied any chest pain abdominal pain dysuria or diarrhea.  No weight loss.  No sick contact.  Assessment & Plan:   Principal Problem:   Acute cervical lymphadenitis   Acute cervical lymphadenitis Acute laryngitis - Failed outpatient antibiotic treatment x 2 -Clinically, there is no compromise of breathing or swallowing. - Case was discussed with on-call ENT Dr.,Bennett, who reviewed the CT neck soft tissue and recommended Decadron  and IV antibiotics Plan: IR consulted for lymph node aspiration today Continue Zosyn  Continue Decadron  Follow EBV titers IgM and IgG.  This may take several days to come back   Morbid obesity BMI 39.  Complicates overall care and prognosis     DVT  prophylaxis: SCD Code Status: Full Family Communication: None Disposition Plan: Status is: Observation The patient will require care spanning > 2 midnights and should be moved to inpatient because: Acute cervical lymphadenitis and laryngitis.  Failed outpatient antibiotic therapy.  Currently on IV antibiotics and intravenous steroid.  Anticipate discharge in 2 days.   Level of care: Med-Surg  Consultants:  ENT IR  Procedures:  Lymph node aspiration  Antimicrobials: Zosyn    Subjective: Seen and examined.  Resting in bed.  No pain complaints.  Appears anxious.  Objective: Vitals:   04/19/24 1934 04/20/24 0544 04/20/24 0803 04/20/24 0803  BP: (!) 139/90 134/87 136/82 136/82  Pulse: 93 76 99 94  Resp: 18 17 16 16   Temp: 98.3 F (36.8 C) 98.1 F (36.7 C) 98.8 F (37.1 C) 98.8 F (37.1 C)  TempSrc:      SpO2: 100% 100% 100% 100%  Weight:      Height:        Intake/Output Summary (Last 24 hours) at 04/20/2024 1057 Last data filed at 04/19/2024 1302 Gross per 24 hour  Intake 42.02 ml  Output --  Net 42.02 ml   Filed Weights   04/19/24 0406  Weight: 98.4 kg    Examination:  General exam: NAD Respiratory system: Clear to auscultation. Respiratory effort normal. Cardiovascular system: Swan S2, RRR, no murmurs, no pedal edema Neck: Swelling and tenderness to palpation Gastrointestinal system: Obese, soft, NT/ND, normal bowel sounds Central nervous system: Alert and oriented. No focal neurological deficits. Extremities: Symmetric 5 x 5 power.  Skin: No rashes, lesions or ulcers Psychiatry: Judgement and insight appear normal. Mood & affect appropriate.     Data Reviewed: I have personally reviewed following labs and imaging studies  CBC: Recent Labs  Lab 04/19/24 0428 04/20/24 0914  WBC 16.8* 21.1*  NEUTROABS 12.9* 19.3*  HGB 11.6* 11.4*  HCT 36.7 35.9*  MCV 92.7 91.3  PLT 433* 438*   Basic Metabolic Panel: Recent Labs  Lab 04/19/24 0428  04/20/24 0914  NA 139 138  K 3.5 3.6  CL 106 105  CO2 23 22  GLUCOSE 117* 131*  BUN 6 7  CREATININE 0.71 0.61  CALCIUM 9.3 9.3   GFR: Estimated Creatinine Clearance: 114.7 mL/min (by C-G formula based on SCr of 0.61 mg/dL). Liver Function Tests: No results for input(s): AST, ALT, ALKPHOS, BILITOT, PROT, ALBUMIN in the last 168 hours. No results for input(s): LIPASE, AMYLASE in the last 168 hours. No results for input(s): AMMONIA in the last 168 hours. Coagulation Profile: Recent Labs  Lab 04/20/24 0914  INR 1.1   Cardiac Enzymes: No results for input(s): CKTOTAL, CKMB, CKMBINDEX, TROPONINI in the last 168 hours. BNP (last 3 results) No results for input(s): PROBNP in the last 8760 hours. HbA1C: No results for input(s): HGBA1C in the last 72 hours. CBG: No results for input(s): GLUCAP in the last 168 hours. Lipid Profile: No results for input(s): CHOL, HDL, LDLCALC, TRIG, CHOLHDL, LDLDIRECT in the last 72 hours. Thyroid Function Tests: No results for input(s): TSH, T4TOTAL, FREET4, T3FREE, THYROIDAB in the last 72 hours. Anemia Panel: No results for input(s): VITAMINB12, FOLATE, FERRITIN, TIBC, IRON, RETICCTPCT in the last 72 hours. Sepsis Labs: No results for input(s): PROCALCITON, LATICACIDVEN in the last 168 hours.  Recent Results (from the past 240 hours)  Group A Strep by PCR ([facility] Only)     Status: None   Collection Time: 04/19/24  4:30 AM   Specimen: Throat; Sterile Swab  Result Value Ref Range Status   Group A Strep by PCR NOT DETECTED NOT DETECTED Final    Comment: Performed at [facility], [address], [place], [place] [address]         Radiology Studies: CT Soft Tissue Neck W Contrast Result Date: 04/19/2024 EXAM: CT NECK WITH CONTRAST 04/19/2024 05:28:47 AM TECHNIQUE: CT of the neck was performed with the administration of intravenous contrast. Multiplanar reformatted  images are provided for review. Automated exposure control, iterative reconstruction, and/or weight based adjustment of the mA/kV was utilized to reduce the radiation dose to as low as reasonably achievable. COMPARISON: None available. CLINICAL HISTORY: Soft tissue infection suspected, neck, xray done. Pt c/o sore throat/neck pain x mo; ibuprofen  400mg  taken PTA without relief; pt presents with large amount swelling to left side of neck which she st has worsened since being here earlier. FINDINGS: AERODIGESTIVE TRACT: There is extensive nasopharyngeal and oropharyngeal soft tissue swelling and enhancement, which is more pronounced on the right. There is an ovoid area of nodular thickening and enhancement within the right nasopharynx on image 21 of series 2 measuring approximately 2.4 x 1.9 cm. There is a cyst on the left on the same image measuring approximately 11 x 7 mm. There is nodular enhancement along the right palatine tonsils on image 29, measuring approximately 2.4 x 2.1 cm. SALIVARY GLANDS: There are few submental and submandibular nodes. THYROID: Unremarkable. LYMPH NODES: There is a conglomerate nodal mass present in the right level 2 and 3 nodal chains, measuring approximately 3.6 x 3.3 x  5.8 cm. There is also a suppurative nodal mass present in the left level 2 nodal chain measuring 3.4 x 3.3 x 4.0 cm. There is also a left level 3 supportive node measuring 3.5 x 2.9 x 4.0 cm. There are additional posterior cervical lymph nodes present bilaterally. SOFT TISSUES: There is adjacent soft tissue stranding. There are inflammatory changes along the left sternocleidomastoid muscle. BRAIN, ORBITS, SINUSES AND MASTOIDS: There is mild polypoid mucosal disease within the left maxillary sinus. LUNGS AND MEDIASTINUM: No acute abnormality. BONES: No focal bone abnormality. IMPRESSION: 1. Extensive nasopharyngeal and oropharyngeal soft tissue swelling and enhancement, more pronounced on the right, with nodular  thickening and enhancement within the right nasopharynx and nodular enhancement along the right palatine tonsils. There is no drainable peritonsillar abscess present. 2. Conglomerate nodal mass in the right level 2 and 3 nodal chains and suppurative nodal mass in the left level 2 nodal chain, with additional supperative node in the left level 3 nodal chain. 3. Inflammatory changes along the left sternocleidomastoid muscle. 4. Retropharyngeal edema. 5. Mild polypoid mucosal disease within the left maxillary sinus. Electronically signed by: Evalene Coho MD 04/19/2024 05:53 AM EDT RP Workstation: [id number]        Scheduled Meds:  dexamethasone  (DECADRON ) injection  4 mg Intravenous Q6H   ondansetron  (ZOFRAN ) IV  4 mg Intravenous Once   pantoprazole   40 mg Oral Daily   Continuous Infusions:  piperacillin -tazobactam (ZOSYN )  IV 3.375 g (04/20/24 0956)     LOS: 0 days       Calvin KATHEE Robson, MD [facility]   If 7PM-7AM, please contact night-coverage  04/20/2024, 10:57 AM

## 2024-04-20 NOTE — Progress Notes (Signed)
 Patient ID: Brandi [name], female   DOB: 1996/05/26, 28 y.o.   MRN: [id number] Brandi, [name] [id number] Aug 16, 1996 Deward GORMAN Dolly, MD   SUBJECTIVE: This 28 y.o. year old female was admitted for worsening cervical lymphadenitis.  Symptoms have continued to improve on IV antibiotics and Decadron .  Mono titers were sent as EBV infection would be a strong possibility even with a negative Monospot in this patient.  Swelling has improved and the nodes are less tender.  She underwent ultrasound-guided needle aspiration today and no purulence was obtained from the separative node, just some blood although that material was sent for cultures.  Medications:  Current Facility-Administered Medications  Medication Dose Route Frequency Provider Last Rate Last Admin   acetaminophen  (TYLENOL ) tablet 650 mg  650 mg Oral Q6H PRN Laurita Cort DASEN, MD       Or   acetaminophen  (TYLENOL ) suppository 650 mg  650 mg Rectal Q6H PRN Laurita Cort DASEN, MD       ALPRAZolam  (XANAX ) tablet 0.25 mg  0.25 mg Oral BID PRN Jhonny Sahara B, MD   0.25 mg at 04/20/24 1015   dexamethasone  (DECADRON ) injection 4 mg  4 mg Intravenous Q6H Laurita Cort T, MD   4 mg at 04/20/24 9382   dicyclomine  (BENTYL ) capsule 10 mg  10 mg Oral Q6H PRN Laurita Cort DASEN, MD       fentaNYL  (SUBLIMAZE ) 100 MCG/2ML injection            HYDROcodone -acetaminophen  (NORCO/VICODIN) 5-325 MG per tablet 1-2 tablet  1-2 tablet Oral Q4H PRN Laurita Cort DASEN, MD       menthol -cetylpyridinium (CEPACOL) lozenge 3 mg  1 lozenge Oral PRN Viviann Pastor, MD       midazolam  (VERSED ) 2 MG/2ML injection            morphine  (PF) 4 MG/ML injection 4 mg  4 mg Intravenous Q4H PRN Laurita Cort T, MD   4 mg at 04/19/24 1505   ondansetron  (ZOFRAN ) injection 4 mg  4 mg Intravenous Once Viviann Pastor, MD       ondansetron  (ZOFRAN ) tablet 4 mg  4 mg Oral Q6H PRN Laurita Cort DASEN, MD       Or   ondansetron  (ZOFRAN ) injection 4 mg  4 mg Intravenous Q6H PRN Laurita Cort T, MD        pantoprazole  (PROTONIX ) EC tablet 40 mg  40 mg Oral Daily Laurita Cort T, MD   40 mg at 04/19/24 1006   phenol (CHLORASEPTIC) mouth spray 1 spray  1 spray Mouth/Throat PRN Laurita Cort DASEN, MD       piperacillin -tazobactam (ZOSYN ) IVPB 3.375 g  3.375 g Intravenous Q8H Hunt, Madison H, RPH 12.5 mL/hr at 04/20/24 0956 3.375 g at 04/20/24 0956   senna-docusate (Senokot-S) tablet 1 tablet  1 tablet Oral QHS PRN Laurita Cort DASEN, MD      .  Medications Prior to Admission  Medication Sig Dispense Refill   pantoprazole  (PROTONIX ) 40 MG tablet Take 1 tablet (40 mg total) by mouth daily. 30 tablet 11   clindamycin (CLEOCIN) 300 MG capsule Take 300 mg by mouth every 6 (six) hours. (Patient not taking: Reported on 04/19/2024)     dicyclomine  (BENTYL ) 10 MG capsule Take 1 capsule (10 mg total) by mouth every 6 (six) hours as needed for up to 7 days for spasms (abdominal pain). 20 capsule 0   Drospirenone  (SLYND ) 4 MG TABS Take 1 tablet (4 mg total) by mouth daily. (Patient not taking:  Reported on 04/19/2024) 180 tablet 1   fluconazole  (DIFLUCAN ) 150 MG tablet Take 150 mg by mouth every 3 (three) days. (Patient not taking: Reported on 04/19/2024)     promethazine  (PHENERGAN ) 12.5 MG tablet Take 1 tablet (12.5 mg total) by mouth every 6 (six) hours as needed for up to 5 days for nausea or vomiting. 20 tablet 0    OBJECTIVE:  PHYSICAL EXAM  Vitals: Blood pressure (!) 133/91, pulse 95, temperature 98.2 F (36.8 C), resp. rate 16, height 5' 2 (1.575 m), weight 98.4 kg, SpO2 100%.. General: Well-developed, Well-nourished in no acute distress Mood: Mood and affect well adjusted, pleasant and cooperative. Orientation: Grossly alert and oriented. Vocal Quality: No hoarseness. Communicates verbally. head and Face: NCAT. No facial asymmetry. No visible skin lesions. No significant facial scars. No tenderness with sinus percussion. Facial strength normal and symmetric. Neck: Supple and symmetric with prominent cervical  lymph nodes bilaterally though they have decreased in firmness and tenderness compared to yesterday.  No crepitance.  No redness of the overlying skin.   Respiratory: Normal respiratory effort without labored breathing.  MEDICAL DECISION MAKING: Data Review:  Results for orders placed or performed during the hospital encounter of 04/19/24 (from the past 48 hours)  Basic metabolic panel     Status: Abnormal   Collection Time: 04/19/24  4:28 AM  Result Value Ref Range   Sodium 139 135 - 145 mmol/L   Potassium 3.5 3.5 - 5.1 mmol/L   Chloride 106 98 - 111 mmol/L   CO2 23 22 - 32 mmol/L   Glucose, Bld 117 (H) 70 - 99 mg/dL    Comment: Glucose reference range applies only to samples taken after fasting for at least 8 hours.   BUN 6 6 - 20 mg/dL   Creatinine, Ser 9.28 0.44 - 1.00 mg/dL   Calcium 9.3 8.9 - 89.6 mg/dL   GFR, Estimated >39 >39 mL/min    Comment: (NOTE) Calculated using the CKD-EPI Creatinine Equation (2021)    Anion gap 10 5 - 15    Comment: Performed at [facility], [address] [place], [place] [address]  CBC with Differential     Status: Abnormal   Collection Time: 04/19/24  4:28 AM  Result Value Ref Range   WBC 16.8 (H) 4.0 - 10.5 K/uL   RBC 3.96 3.87 - 5.11 MIL/uL   Hemoglobin 11.6 (L) 12.0 - 15.0 g/dL   HCT 63.2 63.9 - 53.9 %   MCV 92.7 80.0 - 100.0 fL   MCH 29.3 26.0 - 34.0 pg   MCHC 31.6 30.0 - 36.0 g/dL   RDW 86.6 88.4 - 84.4 %   Platelets 433 (H) 150 - 400 K/uL   nRBC 0.0 0.0 - 0.2 %   Neutrophils Relative % 76 %   Neutro Abs 12.9 (H) 1.7 - 7.7 K/uL   Lymphocytes Relative 15 %   Lymphs Abs 2.5 0.7 - 4.0 K/uL   Monocytes Relative 7 %   Monocytes Absolute 1.2 (H) 0.1 - 1.0 K/uL   Eosinophils Relative 1 %   Eosinophils Absolute 0.2 0.0 - 0.5 K/uL   Basophils Relative 0 %   Basophils Absolute 0.0 0.0 - 0.1 K/uL   Immature Granulocytes 1 %   Abs Immature Granulocytes 0.09 (H) 0.00 - 0.07 K/uL    Comment: Performed at [facility],  [address] [place], [place] [address]  Group A Strep by PCR [facility] Only)     Status: None  Collection Time: 04/19/24  4:30 AM   Specimen: Throat; Sterile Swab  Result Value Ref Range   Group A Strep by PCR NOT DETECTED NOT DETECTED    Comment: Performed at [facility], [address] [place], [place] [address]  Mononucleosis screen     Status: None   Collection Time: 04/19/24  9:20 AM  Result Value Ref Range   Mono Screen NEGATIVE NEGATIVE    Comment: Performed at [facility], [address] [place], [place] [address]  HIV Antibody (routine testing w rflx)     Status: None   Collection Time: 04/19/24  1:40 PM  Result Value Ref Range   HIV Screen 4th Generation wRfx Non Reactive Non Reactive    Comment: Performed at [facility], [address] [place], [place] [address]  CBC with Differential/Platelet     Status: Abnormal   Collection Time: 04/20/24  9:14 AM  Result Value Ref Range   WBC 21.1 (H) 4.0 - 10.5 K/uL   RBC 3.93 3.87 - 5.11 MIL/uL   Hemoglobin 11.4 (L) 12.0 - 15.0 g/dL   HCT 64.0 (L) 63.9 - 53.9 %   MCV 91.3 80.0 - 100.0 fL   MCH 29.0 26.0 - 34.0 pg   MCHC 31.8 30.0 - 36.0 g/dL   RDW 86.2 88.4 - 84.4 %   Platelets 438 (H) 150 - 400 K/uL   nRBC 0.0 0.0 - 0.2 %   Neutrophils Relative % 92 %   Neutro Abs 19.3 (H) 1.7 - 7.7 K/uL   Lymphocytes Relative 5 %   Lymphs Abs 1.1 0.7 - 4.0 K/uL   Monocytes Relative 2 %   Monocytes Absolute 0.4 0.1 - 1.0 K/uL   Eosinophils Relative 0 %   Eosinophils Absolute 0.1 0.0 - 0.5 K/uL   Basophils Relative 0 %   Basophils Absolute 0.0 0.0 - 0.1 K/uL   Immature Granulocytes 1 %   Abs Immature Granulocytes 0.14 (H) 0.00 - 0.07 K/uL    Comment: Performed at [facility], [address] [place], [place] [address]  Basic metabolic panel     Status: Abnormal   Collection Time: 04/20/24  9:14 AM  Result Value Ref Range   Sodium 138 135 - 145 mmol/L   Potassium 3.6 3.5 - 5.1 mmol/L    Chloride 105 98 - 111 mmol/L   CO2 22 22 - 32 mmol/L   Glucose, Bld 131 (H) 70 - 99 mg/dL    Comment: Glucose reference range applies only to samples taken after fasting for at least 8 hours.   BUN 7 6 - 20 mg/dL   Creatinine, Ser 9.38 0.44 - 1.00 mg/dL   Calcium 9.3 8.9 - 89.6 mg/dL   GFR, Estimated >39 >39 mL/min    Comment: (NOTE) Calculated using the CKD-EPI Creatinine Equation (2021)    Anion gap 11 5 - 15    Comment: Performed at [facility], [address] [place], [place] [address]  Protime-INR     Status: None   Collection Time: 04/20/24  9:14 AM  Result Value Ref Range   Prothrombin Time 15.2 11.4 - 15.2 seconds   INR 1.1 0.8 - 1.2    Comment: (NOTE) INR goal varies based on device and disease states. Performed at [facility], [address] [place], [place] [address]   . CT Soft Tissue Neck W Contrast Result Date: 04/19/2024 EXAM: CT NECK WITH CONTRAST 04/19/2024 05:28:47 AM TECHNIQUE: CT of the  neck was performed with the administration of intravenous contrast. Multiplanar reformatted images are provided for review. Automated exposure control, iterative reconstruction, and/or weight based adjustment of the mA/kV was utilized to reduce the radiation dose to as low as reasonably achievable. COMPARISON: None available. CLINICAL HISTORY: Soft tissue infection suspected, neck, xray done. Pt c/o sore throat/neck pain x mo; ibuprofen  400mg  taken PTA without relief; pt presents with large amount swelling to left side of neck which she st has worsened since being here earlier. FINDINGS: AERODIGESTIVE TRACT: There is extensive nasopharyngeal and oropharyngeal soft tissue swelling and enhancement, which is more pronounced on the right. There is an ovoid area of nodular thickening and enhancement within the right nasopharynx on image 21 of series 2 measuring approximately 2.4 x 1.9 cm. There is a cyst on the left on the same image measuring approximately 11 x 7 mm.  There is nodular enhancement along the right palatine tonsils on image 29, measuring approximately 2.4 x 2.1 cm. SALIVARY GLANDS: There are few submental and submandibular nodes. THYROID: Unremarkable. LYMPH NODES: There is a conglomerate nodal mass present in the right level 2 and 3 nodal chains, measuring approximately 3.6 x 3.3 x 5.8 cm. There is also a suppurative nodal mass present in the left level 2 nodal chain measuring 3.4 x 3.3 x 4.0 cm. There is also a left level 3 supportive node measuring 3.5 x 2.9 x 4.0 cm. There are additional posterior cervical lymph nodes present bilaterally. SOFT TISSUES: There is adjacent soft tissue stranding. There are inflammatory changes along the left sternocleidomastoid muscle. BRAIN, ORBITS, SINUSES AND MASTOIDS: There is mild polypoid mucosal disease within the left maxillary sinus. LUNGS AND MEDIASTINUM: No acute abnormality. BONES: No focal bone abnormality. IMPRESSION: 1. Extensive nasopharyngeal and oropharyngeal soft tissue swelling and enhancement, more pronounced on the right, with nodular thickening and enhancement within the right nasopharynx and nodular enhancement along the right palatine tonsils. There is no drainable peritonsillar abscess present. 2. Conglomerate nodal mass in the right level 2 and 3 nodal chains and suppurative nodal mass in the left level 2 nodal chain, with additional supperative node in the left level 3 nodal chain. 3. Inflammatory changes along the left sternocleidomastoid muscle. 4. Retropharyngeal edema. 5. Mild polypoid mucosal disease within the left maxillary sinus. Electronically signed by: Evalene Coho MD 04/19/2024 05:53 AM EDT RP Workstation: [id number]  .   ASSESSMENT: Progressive improvement in cervical lymphadenitis.  No evidence of abscessed node as there was no purulence on attempted needle aspiration.  These are clearly just phlegmonous nodes which should respond to continued medical treatment.  EBV titers are  pending. PLAN: Continue IV antibiotics.  Steroids can be tapered.  CBC will be difficult to follow as the steroids will cause artificial white count elevation due to demargination but she is clearly improving from the standpoint of swelling and pain.  I will sign off since there does not appear to be any need for surgical drainage.  I am happy to see her in follow-up after she is discharged home but I agree that keeping her in the hospital for 2-3 more days is quite reasonable given the severity of the infection.   Deward GORMAN Dolly, MD 04/20/2024 5:11 PM

## 2024-04-20 NOTE — Plan of Care (Signed)

## 2024-04-20 NOTE — Procedures (Signed)
 Interventional Radiology Procedure Note  Procedure: US  Guided aspiration of left cervical lymph node  Complications: None  Estimated Blood Loss: < 10 mL  Findings: US  guided aspiration of 4 cm left cervical lymph node with 18 G spinal needle. Node not liquefied and able to draw out about 1 mL of bloody and turbid fluid for both cytology and culture studies. Micro sample combined with some sterile saline.  Marcey DASEN. Luverne, M.D Pager:  [phone]

## 2024-04-21 DIAGNOSIS — L040 Acute lymphadenitis of face, head and neck: Secondary | ICD-10-CM | POA: Diagnosis not present

## 2024-04-21 MED ORDER — ENOXAPARIN SODIUM 60 MG/0.6ML IJ SOSY
0.5000 mg/kg | PREFILLED_SYRINGE | INTRAMUSCULAR | Status: DC
  Administered 2024-04-21: 50 mg via SUBCUTANEOUS
  Filled 2024-04-21: qty 0.6

## 2024-04-21 NOTE — Progress Notes (Signed)
 PROGRESS NOTE    Brandi [name]  FMW:[id number] DOB: 1996-06-13 DOA: 04/19/2024 PCP: [name], MD    Brief Narrative:   28 y.o. female with medical history significant of mild intermittent asthma, HTN, morbid obesity, presented with worsening of neck and throat pain.   Symptoms started 4-5 weeks ago, patient started to have a pain in the right ear along with right-sided neck pain, she was diagnosed with right ear infection and was treated with first round of 7 days course of p.o. antibiotics.  The right ear pain subsided after the treatment however she continued to experience swelling of right-sided neck, gradually extended to left side of the neck associated with throat pain but there was no trouble swallowing or breathing and no stridors.  She denies any fever or chills.  She came back to ED 2 weeks ago, when she was given another 1 week antibiotics of clindamycin.  After taking 7 days course of clindamycin she continued to experience worsening of swelling and pain of both sides of the neck, right more than left side.  She started to cough up some clear phlegm but overall denied any trouble breathing or chest pains.  She was referred to see pulmonology by PCP for  possible lung infections.  She denied any chest pain abdominal pain dysuria or diarrhea.  No weight loss.  No sick contact.  Assessment & Plan:   Principal Problem:   Acute cervical lymphadenitis Active Problems:   Cervical lymphadenitis   Acute cervical lymphadenitis Acute laryngitis - Failed outpatient antibiotic treatment x 2 -Clinically, there is no compromise of breathing or swallowing. - Case was discussed with on-call ENT Dr.,Bennett, who reviewed the CT neck soft tissue and recommended Decadron  and IV antibiotics - S/p IR guided aspiration Plan: Patient is clinically improving.  Will continue Zosyn  and Decadron  for 1 additional day.  After spate discharge 8/23 on oral steroids and antibiotics.  Will have  patient see ENT in clinic.  EBV titers positive for IgG, negative for IgM.  Indicates past infection.  Remainder of infectious workup overall unrevealing   Morbid obesity BMI 39.  Complicates overall care and prognosis.     DVT prophylaxis: SQ Lovenox  Code Status: Full Family Communication: None Disposition Plan: Status is: Inpatient Remains inpatient appropriate because: Cervical lymphadenitis on IV antibiotics and IV steroids.  Anticipate discharge 8/23.     Level of care: Med-Surg  Consultants:  ENT IR  Procedures:  Lymph node aspiration  Antimicrobials: Zosyn    Subjective: And examined.  Sitting up in bed.  No distress.  In good spirits this morning.  Objective: Vitals:   04/20/24 1605 04/20/24 2104 04/21/24 0458 04/21/24 0757  BP: (!) 133/91 132/84 138/71 (!) 140/90  Pulse: 95 79 74 61  Resp: 16 16 16 16   Temp: 98.2 F (36.8 C) 98.2 F (36.8 C) 97.9 F (36.6 C) 98.7 F (37.1 C)  TempSrc:    Oral  SpO2: 100% 100% 100% 100%  Weight:      Height:       No intake or output data in the 24 hours ending 04/21/24 1121  Filed Weights   04/19/24 0406 04/20/24 1111  Weight: 98.4 kg 98.4 kg    Examination:  General exam: No acute distress Respiratory system: Clear to auscultation. Respiratory effort normal. Cardiovascular system: S1 S2, RRR, no murmurs, no pedal edema Neck: Swelling and tenderness to palpation, improving from prior Gastrointestinal system: Obese, soft, NT/ND, normal bowel sounds Central nervous system: Alert and oriented. No  focal neurological deficits. Extremities: Symmetric 5 x 5 power. Skin: No rashes, lesions or ulcers Psychiatry: Judgement and insight appear normal. Mood & affect appropriate.     Data Reviewed: I have personally reviewed following labs and imaging studies  CBC: Recent Labs  Lab 04/19/24 0428 04/20/24 0914  WBC 16.8* 21.1*  NEUTROABS 12.9* 19.3*  HGB 11.6* 11.4*  HCT 36.7 35.9*  MCV 92.7 91.3  PLT 433*  438*   Basic Metabolic Panel: Recent Labs  Lab 04/19/24 0428 04/20/24 0914  NA 139 138  K 3.5 3.6  CL 106 105  CO2 23 22  GLUCOSE 117* 131*  BUN 6 7  CREATININE 0.71 0.61  CALCIUM 9.3 9.3   GFR: Estimated Creatinine Clearance: 114.7 mL/min (by C-G formula based on SCr of 0.61 mg/dL). Liver Function Tests: No results for input(s): AST, ALT, ALKPHOS, BILITOT, PROT, ALBUMIN in the last 168 hours. No results for input(s): LIPASE, AMYLASE in the last 168 hours. No results for input(s): AMMONIA in the last 168 hours. Coagulation Profile: Recent Labs  Lab 04/20/24 0914  INR 1.1   Cardiac Enzymes: No results for input(s): CKTOTAL, CKMB, CKMBINDEX, TROPONINI in the last 168 hours. BNP (last 3 results) No results for input(s): PROBNP in the last 8760 hours. HbA1C: No results for input(s): HGBA1C in the last 72 hours. CBG: No results for input(s): GLUCAP in the last 168 hours. Lipid Profile: No results for input(s): CHOL, HDL, LDLCALC, TRIG, CHOLHDL, LDLDIRECT in the last 72 hours. Thyroid Function Tests: No results for input(s): TSH, T4TOTAL, FREET4, T3FREE, THYROIDAB in the last 72 hours. Anemia Panel: No results for input(s): VITAMINB12, FOLATE, FERRITIN, TIBC, IRON, RETICCTPCT in the last 72 hours. Sepsis Labs: No results for input(s): PROCALCITON, LATICACIDVEN in the last 168 hours.  Recent Results (from the past 240 hours)  Group A Strep by PCR ([facility] Only)     Status: None   Collection Time: 04/19/24  4:30 AM   Specimen: Throat; Sterile Swab  Result Value Ref Range Status   Group A Strep by PCR NOT DETECTED NOT DETECTED Final    Comment: Performed at [facility], [address] [place], [place] [address]  Aerobic/Anaerobic Culture w Gram Stain (surgical/deep wound)     Status: None (Preliminary result)   Collection Time: 04/20/24 12:01 PM   Specimen: Fine Needle Aspirate  Result Value  Ref Range Status   Specimen Description   Final    NEEDLE ASPIRATE left cervical lym Performed at [facility], [address] [place], [place] [address]    Special Requests   Final    CYTO FNA Performed at [facility], [address] [place], [place] [address]    Gram Stain   Final    NO WBC SEEN NO ORGANISMS SEEN Performed at [facility], [address], [place], [place] [address]    Culture PENDING  Incomplete   Report Status PENDING  Incomplete         Radiology Studies: No results found.       Scheduled Meds:  dexamethasone  (DECADRON ) injection  4 mg Intravenous Q8H   enoxaparin  (LOVENOX ) injection  0.5 mg/kg Subcutaneous Q24H   ondansetron  (ZOFRAN ) IV  4 mg Intravenous Once   pantoprazole   40 mg Oral Daily   Continuous Infusions:  piperacillin -tazobactam (ZOSYN )  IV 3.375 g (04/21/24 0858)     LOS: 1 day       Calvin KATHEE Robson, MD [facility]   If 7PM-7AM,  please contact night-coverage  04/21/2024, 11:21 AM

## 2024-04-21 NOTE — Plan of Care (Signed)
   Problem: Coping: Goal: Level of anxiety will decrease Outcome: Progressing   Problem: Pain Managment: Goal: General experience of comfort will improve and/or be controlled Outcome: Progressing   Problem: Safety: Goal: Ability to remain free from injury will improve Outcome: Progressing

## 2024-04-22 DIAGNOSIS — L040 Acute lymphadenitis of face, head and neck: Secondary | ICD-10-CM | POA: Diagnosis not present

## 2024-04-22 MED ORDER — AMOXICILLIN-POT CLAVULANATE 875-125 MG PO TABS: 1.0000 | ORAL_TABLET | Freq: Two times a day (BID) | ORAL | 0 refills | Status: AC

## 2024-04-22 MED ORDER — DEXAMETHASONE 2 MG PO TABS: ORAL_TABLET | ORAL | 0 refills | Status: AC

## 2024-04-22 NOTE — Discharge Summary (Signed)
 Physician Discharge Summary  Brandi [name] FMW:[id number] DOB: 07-23-1996 DOA: 04/19/2024  PCP: [name], MD  Admit date: 04/19/2024 Discharge date: 04/22/2024  Admitted From: Home Disposition:  Home  Recommendations for Outpatient Follow-up:  Follow up with PCP in 1-2 weeks Follow-up with ENT 1 to 2 weeks  Home Health: No Equipment/Devices: None  Discharge Condition: Stable CODE STATUS: Full Diet recommendation: Regular  Brief/Interim Summary:   28 y.o. female with medical history significant of mild intermittent asthma, HTN, morbid obesity, presented with worsening of neck and throat pain.   Symptoms started 4-5 weeks ago, patient started to have a pain in the right ear along with right-sided neck pain, she was diagnosed with right ear infection and was treated with first round of 7 days course of p.o. antibiotics.  The right ear pain subsided after the treatment however she continued to experience swelling of right-sided neck, gradually extended to left side of the neck associated with throat pain but there was no trouble swallowing or breathing and no stridors.  She denies any fever or chills.  She came back to ED 2 weeks ago, when she was given another 1 week antibiotics of clindamycin.  After taking 7 days course of clindamycin she continued to experience worsening of swelling and pain of both sides of the neck, right more than left side.  She started to cough up some clear phlegm but overall denied any trouble breathing or chest pains.  She was referred to see pulmonology by PCP for  possible lung infections.  She denied any chest pain abdominal pain dysuria or diarrhea.  No weight loss.  No sick contact.    Discharge Diagnoses:  Principal Problem:   Acute cervical lymphadenitis Active Problems:   Cervical lymphadenitis  Acute cervical lymphadenitis Acute laryngitis - Failed outpatient antibiotic treatment x 2 -Clinically, there is no compromise of breathing or  swallowing. - Case was discussed with on-call ENT Dr.,Bennett, who reviewed the CT neck soft tissue and recommended Decadron  and IV antibiotics - IR consulted.  Lymph node was aspirated.  No growth at time of discharge -EBV titers with positive IgG, negative IgM.  Indicates past infection. Plan: Discharge home.  De-escalate to Augmentin .  Additional 7 days prescribed for total 10-day antibiotic course.  Decadron  taper prescribed on DC.  Follow-up outpatient PCP and ENT. IR consulted for lymph node aspiration today Continue Zosyn  Continue Decadron  Follow EBV titers IgM and IgG.  This may take several days to come back   Morbid obesity BMI 39.  Complicates overall care and prognosis   Discharge Instructions  Discharge Instructions     Ambulatory referral to ENT   Complete by: As directed    Hospital followup.  Cervical lymphadenitis   Diet - low sodium heart healthy   Complete by: As directed    Increase activity slowly   Complete by: As directed    No wound care   Complete by: As directed       Allergies as of 04/22/2024       Reactions   Shellfish Allergy Anaphylaxis   Tomato Other (See Comments)   Flares up exema        Medication List     STOP taking these medications    clindamycin 300 MG capsule Commonly known as: CLEOCIN   fluconazole  150 MG tablet Commonly known as: DIFLUCAN    Slynd  4 MG Tabs Generic drug: Drospirenone        TAKE these medications    amoxicillin -clavulanate 875-125 MG tablet  Commonly known as: AUGMENTIN  Take 1 tablet by mouth 2 (two) times daily for 7 days.   dexamethasone  2 MG tablet Commonly known as: DECADRON  Take 2 tablets (4 mg total) by mouth 2 (two) times daily for 3 days, THEN 1 tablet (2 mg total) 2 (two) times daily for 3 days, THEN 1 tablet (2 mg total) daily for 3 days. Start taking on: April 22, 2024   dicyclomine  10 MG capsule Commonly known as: Bentyl  Take 1 capsule (10 mg total) by mouth every 6 (six) hours  as needed for up to 7 days for spasms (abdominal pain).   pantoprazole  40 MG tablet Commonly known as: Protonix  Take 1 tablet (40 mg total) by mouth daily.   promethazine  12.5 MG tablet Commonly known as: PHENERGAN  Take 1 tablet (12.5 mg total) by mouth every 6 (six) hours as needed for up to 5 days for nausea or vomiting.        Follow-up Information     Osei-Bonsu, Zachary, MD. Schedule an appointment as soon as possible for a visit in 1 week(s).   Specialty: Internal Medicine Contact information: [address] [place] [address] [phone]         Blair Mt, MD Follow up.   Specialty: Otolaryngology Why: A hospital followup referral has been added for you.  Please contact Dr. Edythe office ASAP to schedule followup appointment Contact information: [address] [place] [address] [phone]                Allergies  Allergen Reactions   Shellfish Allergy Anaphylaxis   Tomato Other (See Comments)    Flares up exema    Consultations: ENT   Procedures/Studies: US  FINE NEEDLE ASP 1ST LESION Result Date: 04/20/2024 INDICATION: Acute cervical lymphadenitis. EXAM: ULTRASOUND GUIDED NEEDLE ASPIRATE BIOPSY OF LEFT CERVICAL LYMPH NODE MEDICATIONS: None. ANESTHESIA/SEDATION: Moderate (conscious) sedation was employed during this procedure. A total of Versed  2.0 mg and Fentanyl  100 mcg was administered intravenously. Moderate Sedation Time: While minutes. The patient's level of consciousness and vital signs were monitored continuously by radiology nursing throughout the procedure under my direct supervision. PROCEDURE: The procedure, risks, benefits, and alternatives were explained to the patient. Questions regarding the procedure were encouraged and answered. The patient understands and consents to the procedure. A time out was performed prior to initiating the procedure. The left neck was prepped with chlorhexidine in a sterile  fashion, and a sterile drape was applied covering the operative field. A sterile gown and sterile gloves were used for the procedure. Local anesthesia was provided with 1% Lidocaine . After localizing an enlarged left cervical lymph node, an 18 gauge spinal needle was advanced into the lymph node and aspirate samples obtained. Two separate aspirates were obtained for both cytology and culture studies. The culture sample was combined with approximately 2 mL of sterile saline. Additional ultrasound was performed. COMPLICATIONS: None immediate. FINDINGS: The largest cervical lymph node in the neck is on the left measuring approximately 4.0 x 3.1 x 3.3 cm. Aspiration did not yield free return fluid but with back and forth motion of an 18 gauge needle internally, there was ability to withdrawal approximately 1 mL bloody and turbid fluid for both cytology and culture studies. IMPRESSION: Ultrasound-guided needle aspiration of enlarged left cervical lymph node measuring up to 4 cm in greatest diameter. Needle aspiration yielded bloody and turbid fluid yielding 1 mL for both cytology and culture studies. The culture sample was combined with additional 2 mL  of sterile saline. Electronically Signed   By: Marcey Moan M.D.   On: 04/20/2024 13:24   CT Soft Tissue Neck W Contrast Result Date: 04/19/2024 EXAM: CT NECK WITH CONTRAST 04/19/2024 05:28:47 AM TECHNIQUE: CT of the neck was performed with the administration of intravenous contrast. Multiplanar reformatted images are provided for review. Automated exposure control, iterative reconstruction, and/or weight based adjustment of the mA/kV was utilized to reduce the radiation dose to as low as reasonably achievable. COMPARISON: None available. CLINICAL HISTORY: Soft tissue infection suspected, neck, xray done. Pt c/o sore throat/neck pain x mo; ibuprofen  400mg  taken PTA without relief; pt presents with large amount swelling to left side of neck which she st has worsened  since being here earlier. FINDINGS: AERODIGESTIVE TRACT: There is extensive nasopharyngeal and oropharyngeal soft tissue swelling and enhancement, which is more pronounced on the right. There is an ovoid area of nodular thickening and enhancement within the right nasopharynx on image 21 of series 2 measuring approximately 2.4 x 1.9 cm. There is a cyst on the left on the same image measuring approximately 11 x 7 mm. There is nodular enhancement along the right palatine tonsils on image 29, measuring approximately 2.4 x 2.1 cm. SALIVARY GLANDS: There are few submental and submandibular nodes. THYROID: Unremarkable. LYMPH NODES: There is a conglomerate nodal mass present in the right level 2 and 3 nodal chains, measuring approximately 3.6 x 3.3 x 5.8 cm. There is also a suppurative nodal mass present in the left level 2 nodal chain measuring 3.4 x 3.3 x 4.0 cm. There is also a left level 3 supportive node measuring 3.5 x 2.9 x 4.0 cm. There are additional posterior cervical lymph nodes present bilaterally. SOFT TISSUES: There is adjacent soft tissue stranding. There are inflammatory changes along the left sternocleidomastoid muscle. BRAIN, ORBITS, SINUSES AND MASTOIDS: There is mild polypoid mucosal disease within the left maxillary sinus. LUNGS AND MEDIASTINUM: No acute abnormality. BONES: No focal bone abnormality. IMPRESSION: 1. Extensive nasopharyngeal and oropharyngeal soft tissue swelling and enhancement, more pronounced on the right, with nodular thickening and enhancement within the right nasopharynx and nodular enhancement along the right palatine tonsils. There is no drainable peritonsillar abscess present. 2. Conglomerate nodal mass in the right level 2 and 3 nodal chains and suppurative nodal mass in the left level 2 nodal chain, with additional supperative node in the left level 3 nodal chain. 3. Inflammatory changes along the left sternocleidomastoid muscle. 4. Retropharyngeal edema. 5. Mild polypoid  mucosal disease within the left maxillary sinus. Electronically signed by: Evalene Coho MD 04/19/2024 05:53 AM EDT RP Workstation: [id number]      Subjective: Seen and examined on the day of discharge.  Stable no distress.  Appropriate discharge home.  Discharge Exam: Vitals:   04/22/24 0402 04/22/24 0723  BP: 136/85 123/76  Pulse: (!) 54 (!) 59  Resp: 16 17  Temp: 98.4 F (36.9 C) 98.3 F (36.8 C)  SpO2: 98% 100%   Vitals:   04/21/24 1708 04/21/24 2007 04/22/24 0402 04/22/24 0723  BP: (!) 146/97 (!) 142/86 136/85 123/76  Pulse: 67 (!) 55 (!) 54 (!) 59  Resp: 17 16 16 17   Temp: 98.5 F (36.9 C) 98.7 F (37.1 C) 98.4 F (36.9 C) 98.3 F (36.8 C)  TempSrc:  Oral Oral Oral  SpO2: 100% 100% 98% 100%  Weight:      Height:        General: Pt is alert, awake, not in acute distress Cardiovascular: RRR, S1/S2 +,  no rubs, no gallops Respiratory: CTA bilaterally, no wheezing, no rhonchi Abdominal: Soft, NT, ND, bowel sounds + Extremities: no edema, no cyanosis    The results of significant diagnostics from this hospitalization (including imaging, microbiology, ancillary and laboratory) are listed below for reference.     Microbiology: Recent Results (from the past 240 hours)  Group A Strep by PCR ([facility] Only)     Status: None   Collection Time: 04/19/24  4:30 AM   Specimen: Throat; Sterile Swab  Result Value Ref Range Status   Group A Strep by PCR NOT DETECTED NOT DETECTED Final    Comment: Performed at [facility], [address] [place], [place] [address]  Aerobic/Anaerobic Culture w Gram Stain (surgical/deep wound)     Status: None (Preliminary result)   Collection Time: 04/20/24 12:01 PM   Specimen: Fine Needle Aspirate  Result Value Ref Range Status   Specimen Description   Final    NEEDLE ASPIRATE left cervical lym Performed at [facility], [address] [place], [place] [address]    Special Requests   Final    CYTO FNA Performed  at [facility], [address] [place], [place] [address]    Gram Stain NO WBC SEEN NO ORGANISMS SEEN   Final   Culture   Final    NO GROWTH 2 DAYS Performed at [facility], [address], [place], [place] [address]    Report Status PENDING  Incomplete     Labs: BNP (last 3 results) No results for input(s): BNP in the last 8760 hours. Basic Metabolic Panel: Recent Labs  Lab 04/19/24 0428 04/20/24 0914  NA 139 138  K 3.5 3.6  CL 106 105  CO2 23 22  GLUCOSE 117* 131*  BUN 6 7  CREATININE 0.71 0.61  CALCIUM 9.3 9.3   Liver Function Tests: No results for input(s): AST, ALT, ALKPHOS, BILITOT, PROT, ALBUMIN in the last 168 hours. No results for input(s): LIPASE, AMYLASE in the last 168 hours. No results for input(s): AMMONIA in the last 168 hours. CBC: Recent Labs  Lab 04/19/24 0428 04/20/24 0914  WBC 16.8* 21.1*  NEUTROABS 12.9* 19.3*  HGB 11.6* 11.4*  HCT 36.7 35.9*  MCV 92.7 91.3  PLT 433* 438*   Cardiac Enzymes: No results for input(s): CKTOTAL, CKMB, CKMBINDEX, TROPONINI in the last 168 hours. BNP: Invalid input(s): POCBNP CBG: No results for input(s): GLUCAP in the last 168 hours. D-Dimer No results for input(s): DDIMER in the last 72 hours. Hgb A1c No results for input(s): HGBA1C in the last 72 hours. Lipid Profile No results for input(s): CHOL, HDL, LDLCALC, TRIG, CHOLHDL, LDLDIRECT in the last 72 hours. Thyroid function studies No results for input(s): TSH, T4TOTAL, T3FREE, THYROIDAB in the last 72 hours.  Invalid input(s): FREET3 Anemia work up No results for input(s): VITAMINB12, FOLATE, FERRITIN, TIBC, IRON, RETICCTPCT in the last 72 hours. Urinalysis    Component Value Date/Time   COLORURINE YELLOW (A) 12/28/2022 1540   APPEARANCEUR HAZY (A) 12/28/2022 1540   LABSPEC 1.012 12/28/2022 1540   PHURINE 7.0 12/28/2022 1540   GLUCOSEU NEGATIVE 12/28/2022  1540   HGBUR MODERATE (A) 12/28/2022 1540   BILIRUBINUR NEGATIVE 12/28/2022 1540   KETONESUR NEGATIVE 12/28/2022 1540   PROTEINUR NEGATIVE 12/28/2022 1540   UROBILINOGEN 1.0 05/20/2015 1230   NITRITE NEGATIVE 12/28/2022 1540   LEUKOCYTESUR LARGE (A) 12/28/2022 1540   Sepsis Labs Recent Labs  Lab 04/19/24 0428 04/20/24 0914  WBC 16.8*  21.1*   Microbiology Recent Results (from the past 240 hours)  Group A Strep by PCR ([facility] Only)     Status: None   Collection Time: 04/19/24  4:30 AM   Specimen: Throat; Sterile Swab  Result Value Ref Range Status   Group A Strep by PCR NOT DETECTED NOT DETECTED Final    Comment: Performed at [facility], [address] [place], [place] [address]  Aerobic/Anaerobic Culture w Gram Stain (surgical/deep wound)     Status: None (Preliminary result)   Collection Time: 04/20/24 12:01 PM   Specimen: Fine Needle Aspirate  Result Value Ref Range Status   Specimen Description   Final    NEEDLE ASPIRATE left cervical lym Performed at [facility], [address], [place], [place] [address]    Special Requests   Final    CYTO FNA Performed at [facility], [address] [place], [place] [address]    Gram Stain NO WBC SEEN NO ORGANISMS SEEN   Final   Culture   Final    NO GROWTH 2 DAYS Performed at [facility], [address] [place], [place] [address]    Report Status PENDING  Incomplete     Time coordinating discharge: 40 minutes  SIGNED:   Calvin KATHEE Robson, MD  [facility] 04/22/2024, 11:33 AM Pager   If 7PM-7AM, please contact night-coverage

## 2024-04-22 NOTE — Plan of Care (Signed)
  Problem: Education: Goal: Knowledge of General Education information will improve Description: Including pain rating scale, medication(s)/side effects and non-pharmacologic comfort measures Outcome: Progressing   Problem: Clinical Measurements: Goal: Ability to maintain clinical measurements within normal limits will improve Outcome: Progressing Goal: Will remain free from infection Outcome: Progressing   Problem: Nutrition: Goal: Adequate nutrition will be maintained Outcome: Progressing   Problem: Coping: Goal: Level of anxiety will decrease Outcome: Progressing   

## 2024-04-25 LAB — AEROBIC/ANAEROBIC CULTURE W GRAM STAIN (SURGICAL/DEEP WOUND)
Culture: NO GROWTH
Gram Stain: NONE SEEN

## 2024-04-27 ENCOUNTER — Ambulatory Visit: Admitting: Sleep Medicine

## 2024-05-02 ENCOUNTER — Ambulatory Visit: Admitting: Sleep Medicine

## 2024-05-05 LAB — CYTOLOGY - NON PAP

## 2024-05-10 ENCOUNTER — Ambulatory Visit: Admitting: Sleep Medicine

## 2024-05-16 ENCOUNTER — Inpatient Hospital Stay
Admission: EM | Admit: 2024-05-16 | Discharge: 2024-05-19 | DRG: 144 | Disposition: A | Discharge status: Home / Self Care | Attending: Internal Medicine | Admitting: Internal Medicine

## 2024-05-16 ENCOUNTER — Other Ambulatory Visit: Payer: Self-pay

## 2024-05-16 ENCOUNTER — Emergency Department

## 2024-05-16 DIAGNOSIS — J45909 Unspecified asthma, uncomplicated: Secondary | ICD-10-CM | POA: Diagnosis present

## 2024-05-16 DIAGNOSIS — R590 Localized enlarged lymph nodes: Secondary | ICD-10-CM | POA: Diagnosis not present

## 2024-05-16 DIAGNOSIS — F17290 Nicotine dependence, other tobacco product, uncomplicated: Secondary | ICD-10-CM | POA: Diagnosis present

## 2024-05-16 DIAGNOSIS — Z833 Family history of diabetes mellitus: Secondary | ICD-10-CM

## 2024-05-16 DIAGNOSIS — L0390 Cellulitis, unspecified: Secondary | ICD-10-CM | POA: Diagnosis present

## 2024-05-16 DIAGNOSIS — Z6841 Body Mass Index (BMI) 40.0 and over, adult: Secondary | ICD-10-CM | POA: Diagnosis not present

## 2024-05-16 DIAGNOSIS — K219 Gastro-esophageal reflux disease without esophagitis: Secondary | ICD-10-CM | POA: Diagnosis present

## 2024-05-16 DIAGNOSIS — I10 Essential (primary) hypertension: Secondary | ICD-10-CM | POA: Diagnosis present

## 2024-05-16 DIAGNOSIS — I889 Nonspecific lymphadenitis, unspecified: Principal | ICD-10-CM

## 2024-05-16 DIAGNOSIS — L040 Acute lymphadenitis of face, head and neck: Secondary | ICD-10-CM | POA: Diagnosis present

## 2024-05-16 DIAGNOSIS — E860 Dehydration: Secondary | ICD-10-CM | POA: Diagnosis present

## 2024-05-16 DIAGNOSIS — Z79899 Other long term (current) drug therapy: Secondary | ICD-10-CM

## 2024-05-16 DIAGNOSIS — L03221 Cellulitis of neck: Secondary | ICD-10-CM | POA: Diagnosis present

## 2024-05-16 DIAGNOSIS — Z91018 Allergy to other foods: Secondary | ICD-10-CM | POA: Diagnosis not present

## 2024-05-16 DIAGNOSIS — C119 Malignant neoplasm of nasopharynx, unspecified: Secondary | ICD-10-CM | POA: Diagnosis present

## 2024-05-16 DIAGNOSIS — E66813 Obesity, class 3: Secondary | ICD-10-CM | POA: Diagnosis present

## 2024-05-16 DIAGNOSIS — Z8249 Family history of ischemic heart disease and other diseases of the circulatory system: Secondary | ICD-10-CM | POA: Diagnosis not present

## 2024-05-16 DIAGNOSIS — Z91013 Allergy to seafood: Secondary | ICD-10-CM

## 2024-05-16 DIAGNOSIS — I96 Gangrene, not elsewhere classified: Secondary | ICD-10-CM | POA: Diagnosis not present

## 2024-05-16 LAB — CBC
HCT: 40.2 % (ref 36.0–46.0)
HCT: 36.7 % (ref 36.0–46.0)
Hemoglobin: 12.7 g/dL (ref 12.0–15.0)
Hemoglobin: 11.8 g/dL — ABNORMAL LOW (ref 12.0–15.0)
MCH: 29.8 pg (ref 26.0–34.0)
MCH: 30 pg (ref 26.0–34.0)
MCHC: 31.6 g/dL (ref 30.0–36.0)
MCHC: 32.2 g/dL (ref 30.0–36.0)
MCV: 94.4 fL (ref 80.0–100.0)
MCV: 93.4 fL (ref 80.0–100.0)
Platelets: 393 K/uL (ref 150–400)
Platelets: 365 K/uL (ref 150–400)
RBC: 4.26 MIL/uL (ref 3.87–5.11)
RBC: 3.93 MIL/uL (ref 3.87–5.11)
RDW: 14.6 % (ref 11.5–15.5)
RDW: 14.2 % (ref 11.5–15.5)
WBC: 13.9 K/uL — ABNORMAL HIGH (ref 4.0–10.5)
WBC: 19.5 K/uL — ABNORMAL HIGH (ref 4.0–10.5)
nRBC: 0 % (ref 0.0–0.2)
nRBC: 0 % (ref 0.0–0.2)

## 2024-05-16 LAB — HEPATIC FUNCTION PANEL
ALT: 23 U/L (ref 0–44)
AST: 21 U/L (ref 15–41)
Albumin: 3.8 g/dL (ref 3.5–5.0)
Alkaline Phosphatase: 79 U/L (ref 38–126)
Bilirubin, Direct: 0.1 mg/dL (ref 0.0–0.2)
Indirect Bilirubin: 0.4 mg/dL (ref 0.3–0.9)
Total Bilirubin: 0.5 mg/dL (ref 0.0–1.2)
Total Protein: 8 g/dL (ref 6.5–8.1)

## 2024-05-16 LAB — CREATININE, SERUM
Creatinine, Ser: 0.68 mg/dL (ref 0.44–1.00)
GFR, Estimated: >60 mL/min (ref >60)

## 2024-05-16 LAB — BASIC METABOLIC PANEL WITH GFR
Anion gap: 16 — ABNORMAL HIGH (ref 5–15)
BUN: 11 mg/dL (ref 6–20)
CO2: 22 mmol/L (ref 22–32)
Calcium: 9.5 mg/dL (ref 8.9–10.3)
Chloride: 102 mmol/L (ref 98–111)
Creatinine, Ser: 0.83 mg/dL (ref 0.44–1.00)
GFR, Estimated: >60 mL/min (ref >60)
Glucose, Bld: 130 mg/dL — ABNORMAL HIGH (ref 70–99)
Potassium: 3.9 mmol/L (ref 3.5–5.1)
Sodium: 140 mmol/L (ref 135–145)

## 2024-05-16 LAB — SEDIMENTATION RATE: Sed Rate: 53 mm/h — ABNORMAL HIGH (ref 0–20)

## 2024-05-16 LAB — LACTATE DEHYDROGENASE: LDH: 260 U/L — ABNORMAL HIGH (ref 98–192)

## 2024-05-16 MED ORDER — MORPHINE SULFATE (PF) 2 MG/ML IV SOLN
2.0000 mg | INTRAVENOUS | Status: DC | PRN
  Administered 2024-05-16 (×2): 2 mg via INTRAVENOUS
  Filled 2024-05-16 (×2): qty 1

## 2024-05-16 MED ORDER — DEXAMETHASONE SODIUM PHOSPHATE 10 MG/ML IJ SOLN
10.0000 mg | Freq: Four times a day (QID) | INTRAMUSCULAR | Status: AC
  Administered 2024-05-16 – 2024-05-18 (×8): 10 mg via INTRAVENOUS
  Filled 2024-05-16 (×8): qty 1

## 2024-05-16 MED ORDER — SODIUM CHLORIDE 0.9 % IV SOLN
3.0000 g | Freq: Once | INTRAVENOUS | Status: AC
  Administered 2024-05-16: 3 g via INTRAVENOUS
  Filled 2024-05-16: qty 8

## 2024-05-16 MED ORDER — ACETAMINOPHEN 325 MG PO TABS: 650.0000 mg | ORAL_TABLET | Freq: Four times a day (QID) | ORAL | Status: DC | PRN

## 2024-05-16 MED ORDER — OXYCODONE HCL 5 MG PO TABS
5.0000 mg | ORAL_TABLET | ORAL | Status: DC | PRN
  Administered 2024-05-16 – 2024-05-17 (×3): 5 mg via ORAL
  Filled 2024-05-16 (×3): qty 1

## 2024-05-16 MED ORDER — DEXAMETHASONE SODIUM PHOSPHATE 10 MG/ML IJ SOLN
8.0000 mg | Freq: Four times a day (QID) | INTRAMUSCULAR | Status: DC
  Filled 2024-05-16: qty 0.8

## 2024-05-16 MED ORDER — PANTOPRAZOLE SODIUM 40 MG IV SOLR
40.0000 mg | Freq: Two times a day (BID) | INTRAVENOUS | Status: DC
  Administered 2024-05-16 – 2024-05-18 (×5): 40 mg via INTRAVENOUS
  Filled 2024-05-16 (×5): qty 10

## 2024-05-16 MED ORDER — ENOXAPARIN SODIUM 60 MG/0.6ML IJ SOSY
0.5000 mg/kg | PREFILLED_SYRINGE | INTRAMUSCULAR | Status: DC
  Administered 2024-05-16 – 2024-05-18 (×3): 50 mg via SUBCUTANEOUS
  Filled 2024-05-16 (×3): qty 0.6

## 2024-05-16 MED ORDER — DEXAMETHASONE SODIUM PHOSPHATE 10 MG/ML IJ SOLN
10.0000 mg | Freq: Once | INTRAMUSCULAR | Status: AC
  Administered 2024-05-16: 10 mg via INTRAVENOUS
  Filled 2024-05-16: qty 1

## 2024-05-16 MED ORDER — MORPHINE SULFATE (PF) 4 MG/ML IV SOLN
4.0000 mg | Freq: Once | INTRAVENOUS | Status: AC
  Administered 2024-05-16: 4 mg via INTRAVENOUS
  Filled 2024-05-16: qty 1

## 2024-05-16 MED ORDER — POLYETHYLENE GLYCOL 3350 17 G PO PACK: 17.0000 g | PACK | Freq: Every day | ORAL | Status: DC | PRN

## 2024-05-16 MED ORDER — SODIUM CHLORIDE 0.9 % IV BOLUS
1000.0000 mL | Freq: Once | INTRAVENOUS | Status: AC
Start: 2024-05-16 — End: 2024-05-16
  Administered 2024-05-16: 1000 mL via INTRAVENOUS

## 2024-05-16 MED ORDER — IPRATROPIUM-ALBUTEROL 0.5-2.5 (3) MG/3ML IN SOLN: 3.0000 mL | Freq: Four times a day (QID) | RESPIRATORY_TRACT | Status: DC | PRN

## 2024-05-16 MED ORDER — ACETAMINOPHEN 650 MG RE SUPP: 650.0000 mg | Freq: Four times a day (QID) | RECTAL | Status: DC | PRN

## 2024-05-16 MED ORDER — ONDANSETRON HCL 4 MG/2ML IJ SOLN
4.0000 mg | Freq: Once | INTRAMUSCULAR | Status: AC
  Administered 2024-05-16: 4 mg via INTRAVENOUS
  Filled 2024-05-16: qty 2

## 2024-05-16 MED ORDER — ONDANSETRON HCL 4 MG PO TABS
4.0000 mg | ORAL_TABLET | Freq: Four times a day (QID) | ORAL | Status: DC | PRN
  Filled 2024-05-16: qty 1

## 2024-05-16 MED ORDER — IOHEXOL 300 MG/ML  SOLN
75.0000 mL | Freq: Once | INTRAMUSCULAR | Status: AC | PRN
  Administered 2024-05-16: 75 mL via INTRAVENOUS

## 2024-05-16 MED ORDER — SODIUM CHLORIDE 0.9 % IV SOLN
3.0000 g | Freq: Four times a day (QID) | INTRAVENOUS | Status: DC
  Administered 2024-05-16 – 2024-05-19 (×11): 3 g via INTRAVENOUS
  Filled 2024-05-16 (×13): qty 8

## 2024-05-16 MED ORDER — OXYCODONE-ACETAMINOPHEN 5-325 MG PO TABS: 1.0000 | ORAL_TABLET | Freq: Once | ORAL | Status: DC

## 2024-05-16 MED ORDER — ONDANSETRON HCL 4 MG/2ML IJ SOLN
4.0000 mg | Freq: Four times a day (QID) | INTRAMUSCULAR | Status: DC | PRN
  Administered 2024-05-16 – 2024-05-19 (×2): 4 mg via INTRAVENOUS
  Filled 2024-05-16 (×2): qty 2

## 2024-05-16 NOTE — ED Notes (Signed)
 Pharmacy stated okay to give a dose of PRN morphine  at this time if pt is alert and oriented and not in any respiratory depression. One dose given at this time.

## 2024-05-16 NOTE — ED Triage Notes (Signed)
 Pt to ED via POV from home. Pt ambulatory to triage. Pt reports lump on right side of neck that started a few days ago. Pt reports seen on 8/19 for a lump on the left side that went away. Pt reports pain is worse this time. Pt was admitted on 8/19 for cervical lymphadenitis.

## 2024-05-16 NOTE — H&P (Signed)
 History and Physical    Brandi [name] FMW:[id number] DOB: 19-Apr-1996 DOA: 05/16/2024  DOS: the patient was seen and examined on 05/16/2024  PCP: [name], MD   Patient coming from: Home  I have personally briefly reviewed patient's old medical records in [facility]  Chief Complaint: Neck pain and swelling  HPI: Brandi [name] is a pleasant 28 y.o. female with medical history significant for asthma, GERD, HTN who presented to ED for right-sided neck pain and swelling.  Patient was seen a month ago in the emergency department for left-sided neck swelling, followed up with Dr. Blair of ENT and had a biopsy done.  However, patient has not heard of biopsy results.  At that time she was treated with IV and p.o. antibiotics.  For the last 1 week she started having right-sided neck pain and swelling.  Now she started having difficulty swallowing.  She denies any fever, chills, nausea, vomiting.  ED Course: Upon arrival to the ED, patient is found to be tachycardic at 117, hypertensive around 146/109, CT neck showed significant lymphadenopathy bilaterally with multiple necrotic appearing lymph nodes.  ENT service was consulted Dr. Milissa from ENT will be seeing the patient.  He advised for IV Unasyn  and IV Decadron .  Hospitalist service was consulted for evaluation for admission.  Review of Systems:  ROS  All other systems negative except as noted in the HPI.  Past Medical History:  Diagnosis Date   Asthma    GERD (gastroesophageal reflux disease)    HTN (hypertension)     Past Surgical History:  Procedure Laterality Date   dental procedure       reports that she has quit smoking. Her smoking use included e-cigarettes. She has never used smokeless tobacco. She reports that she does not currently use alcohol. She reports current drug use. Drug: Marijuana.  Allergies  Allergen Reactions   Shellfish Allergy Anaphylaxis   Tomato Other (See Comments)    Flares up exema     Family History  Problem Relation Age of Onset   Hypertension Maternal Grandmother    Diabetes Maternal Grandmother     Prior to Admission medications   Medication Sig Start Date End Date Taking? Authorizing Provider  pantoprazole  (PROTONIX ) 40 MG tablet Take 1 tablet (40 mg total) by mouth daily. 04/18/24 04/18/25 Yes Bradler, Evan K, MD  promethazine  (PHENERGAN ) 12.5 MG tablet Take 1 tablet (12.5 mg total) by mouth every 6 (six) hours as needed for up to 5 days for nausea or vomiting. 02/29/24 03/05/24  Levander Slate, MD    Physical Exam: Vitals:   05/16/24 0758 05/16/24 0952  BP: (!) 146/109 (!) 148/89  Pulse: (!) 117 86  Resp: 18 16  Temp: 98.7 F (37.1 C) 98.6 F (37 C)  TempSrc: Oral Oral  SpO2: 100% 99%    Physical Exam   Constitutional: Alert, awake, calm, comfortable HEENT: Neck supple, right side of the neck is swollen, tender, palpable lymph nodes Respiratory: Clear to auscultation B/L, no wheezing, no rales.  Cardiovascular: Regular rate and rhythm, no murmurs / rubs / gallops. No extremity edema. 2+ pedal pulses. No carotid bruits.  Abdomen: Soft, no tenderness, Bowel sounds positive.  Musculoskeletal: no clubbing / cyanosis. Good ROM, no contractures. Normal muscle tone.  Skin: no rashes, lesions, ulcers. Neurologic: CN 2-12 grossly intact. Sensation intact, No focal deficit identified Psychiatric: Alert and oriented x 3. Normal mood.    Labs on Admission: I have personally reviewed following labs and imaging studies  CBC: Recent Labs  Lab 05/16/24 0825  WBC 13.9*  HGB 12.7  HCT 40.2  MCV 94.4  PLT 393   Basic Metabolic Panel: Recent Labs  Lab 05/16/24 0825  NA 140  K 3.9  CL 102  CO2 22  GLUCOSE 130*  BUN 11  CREATININE 0.83  CALCIUM 9.5   GFR: CrCl cannot be calculated (Unknown ideal weight.). Liver Function Tests: No results for input(s): AST, ALT, ALKPHOS, BILITOT, PROT, ALBUMIN in the last 168 hours. No results for input(s):  LIPASE, AMYLASE in the last 168 hours. No results for input(s): AMMONIA in the last 168 hours. Coagulation Profile: No results for input(s): INR, PROTIME in the last 168 hours. Cardiac Enzymes: No results for input(s): CKTOTAL, CKMB, CKMBINDEX, TROPONINI, TROPONINIHS in the last 168 hours. BNP (last 3 results) No results for input(s): BNP in the last 8760 hours. HbA1C: No results for input(s): HGBA1C in the last 72 hours. CBG: No results for input(s): GLUCAP in the last 168 hours. Lipid Profile: No results for input(s): CHOL, HDL, LDLCALC, TRIG, CHOLHDL, LDLDIRECT in the last 72 hours. Thyroid  Function Tests: No results for input(s): TSH, T4TOTAL, FREET4, T3FREE, THYROIDAB in the last 72 hours. Anemia Panel: No results for input(s): VITAMINB12, FOLATE, FERRITIN, TIBC, IRON, RETICCTPCT in the last 72 hours. Urine analysis:    Component Value Date/Time   COLORURINE YELLOW (A) 12/28/2022 1540   APPEARANCEUR HAZY (A) 12/28/2022 1540   LABSPEC 1.012 12/28/2022 1540   PHURINE 7.0 12/28/2022 1540   GLUCOSEU NEGATIVE 12/28/2022 1540   HGBUR MODERATE (A) 12/28/2022 1540   BILIRUBINUR NEGATIVE 12/28/2022 1540   KETONESUR NEGATIVE 12/28/2022 1540   PROTEINUR NEGATIVE 12/28/2022 1540   UROBILINOGEN 1.0 05/20/2015 1230   NITRITE NEGATIVE 12/28/2022 1540   LEUKOCYTESUR LARGE (A) 12/28/2022 1540    Radiological Exams on Admission: I have personally reviewed images CT Soft Tissue Neck W Contrast Result Date: 05/16/2024 EXAM: CT NECK WITH CONTRAST 05/16/2024 09:34:18 AM TECHNIQUE: CT of the neck was performed with the administration of intravenous contrast. Multiplanar reformatted images are provided for review. Automated exposure control, iterative reconstruction, and/or weight based adjustment of the mA/kV was utilized to reduce the radiation dose to as low as reasonably achievable. COMPARISON: CT of the neck dated 04/19/2024. CLINICAL  HISTORY: Soft tissue infection suspected, neck, xray done. Pt to ED via POV from home. Pt ambulatory to triage. Pt reports lump on right side of neck that started a few days ago. Pt reports seen on 8/19 for a lump on the left side that went away. Pt reports pain is worse this time. Pt was admitted on 8/19 for cervical lymphadenitis. FINDINGS: AERODIGESTIVE TRACT: No discrete mass. No edema. An area of nodular thickening in the right nasopharynx seen on image 18 of series 2 appears to have mildly worsened in the interim from approximately 2.4 x 1.9 cm to approximately 2.7 x 2.2 cm. SALIVARY GLANDS: The parotid and submandibular glands are unremarkable. THYROID : Unremarkable. LYMPH NODES: There has been a mixed response to interval therapy. Large suppurative left-sided level 2 and 3 lymph nodes have significantly decreased in size in the interim and there has been interval resolution of surrounding inflammatory stranding. A left level 2 node which previously measured 3.4 x 3.3 x 3.9 cm now measures approximately 2.9 x 2.4 x 2.8 cm. A left level 3 node which previously measured 3.5 x 2.9 x 4.0 cm now measures approximately 2.8 x 2.3 x 3.2 cm. However, a primarily solid right cervical node which  previously measured approximately 3.6 x 3.3 x 5.8 cm now measures approximately 4.0 x 3.9 x 6.5 cm. A right retropharyngeal node which was previously heterogeneous in attenuation is now diffusely cystic. Had previously measured approximately 2.4 x 2.1 x 3.7 cm and now measures approximately 2.6 x 2.3 x 4.4 cm. There are several additional mildly enlarged cervical lymph nodes which have not changed significantly in the interim. SOFT TISSUES: No mass or fluid collection. BRAIN, ORBITS, SINUSES AND MASTOIDS: No acute abnormality. LUNGS AND MEDIASTINUM: No acute abnormality. BONES: No focal bone abnormality. IMPRESSION: 1. Mixed response to interval therapy for cervical lymphadenitis. 2. Significant decrease in size of large  suppurative left-sided level 2 and 3 lymph nodes with interval resolution of surrounding inflammatory stranding. 3. Increase in size of a primarily solid right cervical node and a right retropharyngeal node, now diffusely cystic. 4. Mild worsening of nodular thickening in the right nasopharynx. An atypical organism or underlying neoplasm cannot be definitively excluded. Continued follow-up to ensure resolution is strongly advised. Electronically signed by: Evalene Coho MD 05/16/2024 10:21 AM EDT RP Workstation: [id number]    EKG: N/A    Assessment/Plan Principal Problem:   Cellulitis Active Problems:   HTN (hypertension)   GERD (gastroesophageal reflux disease)   Asthma   Obesity, Class III, BMI 40-49.9 (morbid obesity)   Acute cervical lymphadenitis    Assessment and Plan: 28 year old female with multiple medical problems including but not limited to hypertension, GERD, obesity, recent history of bilateral cervical lymphadenitis s/p biopsy who came in today for right neck painful swelling.  1.  Right neck cellulitis/acute lymphadenitis - She will be admitted to hospital as inpatient, failed outpatient treatment - IV Unasyn  will be continued, started in the emergency room. - Follow-up cultures - Per ENT recommendation Decadron  will be continued. - Will follow the recommendation from ENT Dr. Milissa is coming to see the patient - Will give pain medication with morphine  - While patient is on a steroid we will continue IV Protonix . - Further management and plan per ENT  2.  HTN/GERD/asthma - She takes Protonix  for GERD otherwise I do not see medications. - Will give her nebulization for asthma as needed, -will continue to monitor blood pressure    DVT prophylaxis: Lovenox  Code Status: Full Code Family Communication: None  Disposition Plan: Home  Consults called: ENT by EDP Admission status: Inpatient, Med-Surg   Nena Rebel, MD [facility] 05/16/2024, 11:29 AM

## 2024-05-16 NOTE — Consult Note (Signed)
 Brandi [name], Brandi [name] [id number] 1996/03/11 Brandi Gouty, MD  Reason for Consult: Lymphadenitis  HPI: 28 year old female presents to ER with worsening neck pain, odynophagia, and cervical lymphadenopathy.  Previously underwent evaluation in August and underwent US  guided FNA aspiration.  Symptoms improved with antibiotics and steroids but began worsening again over the last several weeks.  She now has significant neck pain worse on right side.  CT scan showed a mixed response on the left and worsening solid component on the right as well as nasopharyngeal abnormality.  Asked to re-evaluate patient.  Had previously followed up with Dr. Blair 04/28/2024 with reports of significant improvement at that time.  Previously IgG EBV significantly elevated.  Allergies:  Allergies  Allergen Reactions   Shellfish Allergy Anaphylaxis   Tomato Other (See Comments)    Flares up exema    ROS: Review of systems normal other than 12 systems except per HPI.  PMH:  Past Medical History:  Diagnosis Date   Asthma    GERD (gastroesophageal reflux disease)    HTN (hypertension)     FH:  Family History  Problem Relation Age of Onset   Hypertension Maternal Grandmother    Diabetes Maternal Grandmother     SH:  Social History   Socioeconomic History   Marital status: Significant Other    Spouse name: Not on file   Number of children: Not on file   Years of education: Not on file   Highest education level: Not on file  Occupational History   Not on file  Tobacco Use   Smoking status: Former    Types: E-cigarettes   Smokeless tobacco: Never  Vaping Use   Vaping status: Former  Substance and Sexual Activity   Alcohol use: Not Currently    Comment: social   Drug use: Yes    Types: Marijuana   Sexual activity: Yes    Partners: Female    Birth control/protection: None  Other Topics Concern   Not on file  Social History Narrative   Not on file   Social Drivers of Health   Financial  Resource Strain: Not on file  Food Insecurity: No Food Insecurity (05/16/2024)   Hunger Vital Sign    Worried About Running Out of Food in the Last Year: Never true    Ran Out of Food in the Last Year: Never true  Transportation Needs: No Transportation Needs (05/16/2024)   PRAPARE - Administrator, Civil Service (Medical): No    Lack of Transportation (Non-Medical): No  Physical Activity: Not on file  Stress: Not on file  Social Connections: Not on file  Intimate Partner Violence: Not At Risk (05/16/2024)   Humiliation, Afraid, Rape, and Kick questionnaire    Fear of Current or Ex-Partner: No    Emotionally Abused: No    Physically Abused: No    Sexually Abused: No    PSH:  Past Surgical History:  Procedure Laterality Date   dental procedure      Physical  Exam:  GEN-  NAD, sitting upright in bed NEURO-CN 2-12 grossly intact and symmetric EARS-  external ears clear, normal EAC NOSE-  mild congestion bilaterally OC/OP-  trismus but posterior oropharyngeal wall visible.  Tonsils erythematous midly with some exudate.  Right tonsil anteriorly displaced slightly.  Uvula midline NECK-  bilateral lymphadenopathy Right > Left.  Significant tenderness on right side with limited mobility RESP- unlabored CARD-  RRR EXT-  dry and warm, normal turgor.  Procedure:  Trans-nasal flexible  laryngoscopy.  Pre-procedure diagnosis:  odynophagia, retropharyngeal cystic mass, nasopharyngeal mass.  Post-procedure diagnosis:  same.  Description of procedure:  After verbal consent was obtained, the patient's right nasal cavity was anesthetized with Gen-nasal and 2% lidocaine .  A flexible fiberoptic laryngoscope was inserted into the patient's right nasal cavity.  This was advanced for visualization of the patient's nasal cavity, nasopharynx, pharynx and larynx.  This demonstrated a cystic appearing lesion smooth walled in her right nasopharynx.  This demonstrated diffuse fullness of right  pharyngeal wall consistent with known retropharyngeal cystic mass.  Airway is widely patent.  Larynx was WNL.  Mild edema of bilateral tonsils.   A/P: Cervical lymphadenitis, nasopharyngeal cyst, retropharyngeal cyst with mixed response to treatment over the last several months.  Plan:  Difficult presentation as mixed response.  Cystic lymph nodes on left side showed improvement without tenderness.  Retropharyngeal cystic mass is 3mm larger than previously and now does not have fat stranding.  Most symptomatic seems to be right submandibular lymph node that is solid and significantly enlarged from previous.  Cystic mass in nasopharynx is of unknown significance.  Discussed options of nasopharyngeal biopsy and trans-oral drainage of retropharyngeal cyst vs US  guide core biopsy of solid right submandibular neck mass.  Patient may ultimately need both and she and partner understand.  Discussed with interventional radiology who feel they can easily biopsy right submandibular mass so will proceed with that as next step.  Will ask for AFB stains and cultures as this may be atypical.  Recommend infectious disease evaluation as well.   Brandi [name] 05/16/2024 1:21 PM

## 2024-05-16 NOTE — Consult Note (Addendum)
 NAME: Brandi [name]  DOB: [birth year]  MRN: [id number]  Date/Time: 05/16/2024 3:15 PM  REQUESTING PROVIDER: Dr.Paudal Subjective:  REASON FOR CONSULT: Necrotizing cervical lymphadenitis ? Brandi [name] is a 28 y.o. female with a history of hypertension, asthma presents with painful swelling of her neck Patient states for the past 2 months she has not been feeling well It started with your pain and then swelling on the left side of the neck.  She initially came to the ER on 1 July for nausea and vomiting and had ear pain then.  She was prescribed Augmentin  at that time.  She  has sore throat and came to the urgent care on 03/30/2024 also had pain in both side  of her neck right more than left.  On examination she was noted to have cervical adenopathy.  Rapid strep that was negative.  There was a concern for a viral upper respiratory infection.  So she was managed with supportive care and was asked to return to the ED if she developed any fever.  She was also given prednisone  40 mg for 5 days and also given clindamycin.. The ear pain resolved but the throat pain was worse as well as the neck swelling on both sides was worse and she got admitted to the hospital between 04/19/2024 until 04/22/2024. A CT of the neck soft tissue showed Extensive nasopharyngeal and oropharyngeal soft tissue swelling and enhancement more pronounced on the right side.  There was also comment from a right lymph node mass on the right side.  There was mild polypoid mucosal disease with the left maxillary sinus.  Patient was treated with IV Unasyn  and steroid Decadron  She underwent needle aspiration of the left side on 04/20/2024 and that showed atypical cells in the background of predominantly necrotic debris.  She was discharged home on Augmentin  and prednisone  and to follow-up with ENT.  She was seen in the ENTs office as outpatient.  And patient claims that after examination of the right side lymph node it started to hurt.  She  returned to the ED today 05/16/2024 with worsening swelling on the right side and severe pain.  She has not had any fever or chills.  She is in a stable relationship with a female partner for the past 10 years.  No new sexual partners In the ED vitals BP of 146/109, temperature 98.7, pulse 117, respiratory rate 18 and sats of 100%. WBC was 13.9, Hb 12.7, platelet 393 and creatinine 0.83. She was started on Unasyn  and also received Decadron . CT of the neck showed mixed response to the interval therapy for cervical lymphadenitis.  There was a significant decrease in size of the large suppurative left sided lymph node with interval resolution of surrounding inflammatory stranding.  But there was increase in size of a primarily solid right cervical node and a right retropharyngeal node now diffusely cystic.  Mild worsening of nodular thickening of the right nasopharynx. Patient lives with a partner's family.  There are kids around but nobody has been sick She has no travel history other than recent trip to [place] .  No tick bites Has a dog at home   Past Medical History:  Diagnosis Date   Asthma    GERD (gastroesophageal reflux disease)    HTN (hypertension)     Past Surgical History:  Procedure Laterality Date   dental procedure      Social History   Socioeconomic History   Marital status: Significant Other    Spouse name: Not  on file   Number of children: Not on file   Years of education: Not on file   Highest education level: Not on file  Occupational History   Not on file  Tobacco Use   Smoking status: Former    Types: E-cigarettes   Smokeless tobacco: Never  Vaping Use   Vaping status: Former  Substance and Sexual Activity   Alcohol use: Not Currently    Comment: social   Drug use: Yes    Types: Marijuana   Sexual activity: Yes    Partners: Female    Birth control/protection: None  Other Topics Concern   Not on file  Social History Narrative   Not on file   Social  Drivers of Health   Financial Resource Strain: Not on file  Food Insecurity: No Food Insecurity (05/16/2024)   Hunger Vital Sign    Worried About Running Out of Food in the Last Year: Never true    Ran Out of Food in the Last Year: Never true  Transportation Needs: No Transportation Needs (05/16/2024)   PRAPARE - Administrator, Civil Service (Medical): No    Lack of Transportation (Non-Medical): No  Physical Activity: Not on file  Stress: Not on file  Social Connections: Not on file  Intimate Partner Violence: Not At Risk (05/16/2024)   Humiliation, Afraid, Rape, and Kick questionnaire    Fear of Current or Ex-Partner: No    Emotionally Abused: No    Physically Abused: No    Sexually Abused: No    Family History  Problem Relation Age of Onset   Hypertension Maternal Grandmother    Diabetes Maternal Grandmother    Allergies  Allergen Reactions   Shellfish Allergy Anaphylaxis   Tomato Other (See Comments)    Flares up exema   I? Current Facility-Administered Medications  Medication Dose Route Frequency Provider Last Rate Last Admin   acetaminophen  (TYLENOL ) tablet 650 mg  650 mg Oral Q6H PRN [name], MD       Or   acetaminophen  (TYLENOL ) suppository 650 mg  650 mg Rectal Q6H PRN Paudel, Keshab, MD       dexamethasone  (DECADRON ) injection 10 mg  10 mg Intravenous Q6H Paudel, Keshab, MD       enoxaparin  (LOVENOX ) injection 50 mg  0.5 mg/kg Subcutaneous Q24H Paudel, Keshab, MD       ipratropium-albuterol  (DUONEB) 0.5-2.5 (3) MG/3ML nebulizer solution 3 mL  3 mL Nebulization Q6H PRN Paudel, Gouty, MD       morphine  (PF) 2 MG/ML injection 2 mg  2 mg Intravenous Q2H PRN Paudel, Keshab, MD   2 mg at 05/16/24 1218   ondansetron  (ZOFRAN ) tablet 4 mg  4 mg Oral Q6H PRN Paudel, Keshab, MD       Or   ondansetron  (ZOFRAN ) injection 4 mg  4 mg Intravenous Q6H PRN Paudel, Gouty, MD       oxyCODONE  (Oxy IR/ROXICODONE ) immediate release tablet 5 mg  5 mg Oral Q4H PRN  Paudel, Gouty, MD       pantoprazole  (PROTONIX ) injection 40 mg  40 mg Intravenous Q12H Paudel, Gouty, MD   40 mg at 05/16/24 1203   polyethylene glycol (MIRALAX  / GLYCOLAX ) packet 17 g  17 g Oral Daily PRN Paudel, Keshab, MD         Abtx:  Anti-infectives (From admission, onward)    Start     Dose/Rate Route Frequency Ordered Stop   05/16/24 1000  Ampicillin -Sulbactam (UNASYN ) 3 g in  sodium chloride  0.9 % 100 mL IVPB        3 g 200 mL/hr over 30 Minutes Intravenous  Once 05/16/24 0956 05/16/24 1048       REVIEW OF SYSTEMS:  Const: negative fever, negative chills, negative weight loss Eyes: negative diplopia or visual changes, negative eye pain ENT: As above. Resp: negative cough, hemoptysis, dyspnea Cards: negative for chest pain, palpitations, lower extremity edema GU: negative for frequency, dysuria and hematuria Had irregular.  With heavy.  Now is on hormone therapy GI: Negative for abdominal pain, diarrhea, bleeding, constipation Skin: negative for rash and pruritus Heme: negative for easy bruising and gum/nose bleeding MS: negative for myalgias, arthralgias, back pain and muscle weakness Neurolo:negative for headaches, dizziness, vertigo, memory problems  Psych: negative for feelings of anxiety, depression  Endocrine: negative for thyroid , diabetes Allergy/Immunology- negative for any medication allergies  VITALS:  BP (!) 160/102 (BP Location: Left Arm)   Pulse (!) 57   Temp 98.2 F (36.8 C)   Resp 17   Ht 5' 2.5 (1.588 m)   Wt 98.1 kg   SpO2 100%   BMI 38.91 kg/m   PHYSICAL EXAM:  General: Alert, cooperative, no distress, appears stated age.  Head: Normocephalic, without obvious abnormality, atraumatic. Eyes: Conjunctivae clear, anicteric sclerae. Pupils are equal ENT Nares normal. No drainage or sinus tenderness. The right tonsil is pushed medially and it is swollen  Neck: Bilateral enlarged cervical lymph nodes.   Back: No CVA tenderness. Lungs: Clear  to auscultation bilaterally. No Wheezing or Rhonchi. No rales. Heart: Regular rate and rhythm, no murmur, rub or gallop. Abdomen: Soft, non-tender,not distended. Bowel sounds normal. No masses Extremities: atraumatic, no cyanosis. No edema. No clubbing Skin: No rashes or lesions. Or bruising Lymph: Unable to feel the axillary or inguinal nodes neurologic: Grossly non-focal Pertinent Labs Lab Results CBC    Component Value Date/Time   WBC 19.5 (H) 05/16/2024 1207   RBC 3.93 05/16/2024 1207   HGB 11.8 (L) 05/16/2024 1207   HCT 36.7 05/16/2024 1207   PLT 365 05/16/2024 1207   MCV 93.4 05/16/2024 1207   MCH 30.0 05/16/2024 1207   MCHC 32.2 05/16/2024 1207   RDW 14.2 05/16/2024 1207   LYMPHSABS 1.1 04/20/2024 0914   MONOABS 0.4 04/20/2024 0914   EOSABS 0.1 04/20/2024 0914   BASOSABS 0.0 04/20/2024 0914       Latest Ref Rng & Units 05/16/2024   12:07 PM 05/16/2024    8:25 AM 04/20/2024    9:14 AM  CMP  Glucose 70 - 99 mg/dL  869  868   BUN 6 - 20 mg/dL  11  7   Creatinine 9.55 - 1.00 mg/dL 9.31  9.16  9.38   Sodium 135 - 145 mmol/L  140  138   Potassium 3.5 - 5.1 mmol/L  3.9  3.6   Chloride 98 - 111 mmol/L  102  105   CO2 22 - 32 mmol/L  22  22   Calcium 8.9 - 10.3 mg/dL  9.5  9.3       Microbiology: No results found for this or any previous visit (from the past 240 hours).    IMAGING RESULTS: CT neck Multiple necrotic lymphnodes  I have personally reviewed the films ? Impression/Recommendation Extensive cervical lymphadenopathy bilateral in various stages of necrotic lymph nodes, abscess and solid lymph node Differential diagnosis is extensive  infectious, autoimmune or malignant] Infection could be bacterial but strep antigen negative in the past. Atypical Mycobacteria/(  TB less likely) Fungal like Histo  viral organisms like EBV CMV HIV is less likely The EBV IgG was positive at 600 but IgM was negative so unlikely this is an acute infection But will check EBV  PCR Will check CMV PCR RPR Bartonella antibodies Toxoplasma antibodies QuantiFERON gold Histoplasma  GC/CHL NAAT throat  Kikuchi disease is in the D.D  Lymphoma remotely possible Previous needle aspiration of the LN was not conclusive except for  She needs excisional biopsy, Tissue to be sent for AFB, fungal, bacterial- would send for 16 s RNA/NGS Pathology Recommend CT/CHEST/ABD/Pelvis or PET to look for other lymphnodes and Hepato splenomegaly Check LFT, LDH  No evidence of lemierre's syndrome   Pt is on unasyn  and decadron  Discussed with ENT Dr. Milissa ________________________________________________ Discussed with patient in detail  Note:  This document was prepared using Dragon voice recognition software and may include unintentional dictation errors.

## 2024-05-16 NOTE — ED Provider Notes (Signed)
 [facility] Provider Note    Event Date/Time   First MD Initiated Contact with Patient 05/16/24 0805     (approximate)  History   Chief Complaint: Neck Swelling  HPI  Brandi [name] is a 28 y.o. female with a past medical history of asthma, gastric reflux, hypertension, presents to the emergency department for right sided neck swelling.  According to the patient approximately 1 month ago she was seen in the emergency department for left-sided neck swelling ultimately followed up with Dr. Blair of ENT and had a biopsy performed although they have not heard back from the biopsy results.  Patient states over the past 1 week or so she has now had significant swelling of the right side of her neck is now having pain when she attempts to swallow.  Patient denies any recent fever states slightly sore throat but that has been unchanged over the past 1 month.  No shortness of breath.  Physical Exam   Triage Vital Signs: ED Triage Vitals  Encounter Vitals Group     BP 05/16/24 0758 (!) 146/109     Girls Systolic BP Percentile --      Girls Diastolic BP Percentile --      Boys Systolic BP Percentile --      Boys Diastolic BP Percentile --      Pulse Rate 05/16/24 0758 (!) 117     Resp 05/16/24 0758 18     Temp 05/16/24 0758 98.7 F (37.1 C)     Temp Source 05/16/24 0758 Oral     SpO2 05/16/24 0758 100 %     Weight --      Height --      Head Circumference --      Peak Flow --      Pain Score 05/16/24 0759 10     Pain Loc --      Pain Education --      Exclude from Growth Chart --     Most recent vital signs: Vitals:   05/16/24 0758  BP: (!) 146/109  Pulse: (!) 117  Resp: 18  Temp: 98.7 F (37.1 C)  SpO2: 100%    General: Awake, no distress.  Patient has moderate anterior cervical lymphadenopathy bilaterally right greater than left.  Moderate tenderness to the right side of the neck as well. CV:  Good peripheral perfusion.  Regular rate and rhythm   Resp:  Normal effort.  Equal breath sounds bilaterally.  Abd:  No distention.  Soft, nontender.   ED Results / Procedures / Treatments   RADIOLOGY  I have reviewed and the CT images.  Patient has significant lymphadenopathy bilaterally with multiple necrotic appearing lymph nodes. Radiology confirms some improvement of the left-sided nodes with worsening of the right sided nodes.   MEDICATIONS ORDERED IN ED: Medications  oxyCODONE -acetaminophen  (PERCOCET/ROXICET) 5-325 MG per tablet 1 tablet (has no administration in time range)     IMPRESSION / MDM / ASSESSMENT AND PLAN / ED COURSE  I reviewed the triage vital signs and the nursing notes.  Patient's presentation is most consistent with acute presentation with potential threat to life or bodily function.  Patient presents emergency department for right-sided neck swelling approximately 3 weeks after she presented for left-sided neck swelling.  I reviewed the patient's chart she was admitted to the hospital 8/20 and discharged 8/23 with worsening neck and throat pain.  Ultimate diagnosis was cervical lymphadenitis.  A fine-needle biopsy was performed however I do not  see any results in our system currently for this besides negative cultures.  Given the patient's significant swelling on exam we will repeat a CT scan with contrast we will check labs and continue to closely monitor.  Patient's lab work today shows mild leukocytosis mild signs of dehydration on her chemistry we will IV hydrate.  Patient CT scan has resulted showing significant lymphadenopathy bilaterally with multiple necrotic nodes.  I spoke to Dr. Milissa of ENT who will be down to see the patient we will start the patient on IV Unasyn  as well as IV Decadron .  Will admit to the hospital service for further workup and treatment.  FINAL CLINICAL IMPRESSION(S) / ED DIAGNOSES   Lymphadenitis    Note:  This document was prepared using Dragon voice recognition software and  may include unintentional dictation errors.   Dorothyann Drivers, MD 05/16/24 1042

## 2024-05-16 NOTE — Consult Note (Addendum)
 Chief Complaint: Right neck swelling. Request is for right cervical lymph node aspiration/ core biopsy.  Referring Physician(s): Dr. Carolee Hunter  Supervising Physician: Philip Cornet  Patient Status: [facility] - In-pt  History of Present Illness: Brandi [name] is a 28 y.o. female inpatient. Known to IR.  History of asthma, HTN, cervical lymphadentitis s/p aspiration of enlarged left cervical lymph node on 8.21.25. Cytology showed mostly necrotic cells.   Presented to the ED at [facility] with right sided neck pain X 1 week.   CT soft tissue neck from 9.16.25 reads . Increase in size of a primarily solid right cervical node and a right retropharyngeal node, now diffusely cystic. Team is requesting a right sided cervical lymph node aspiration / core biopsy.   Patient alert and laying in bed,calm. Endorses neck pain and sore throat. Denies any fevers, headache, chest pain, SOB, cough, abdominal pain, nausea, vomiting or bleeding.   WBC 19.5. All other labs and medications are within acceptable parameters. No pertinent allergies. Patient has been NPO since midnight.  Return precautions and treatment recommendations and follow-up discussed with the patient  who is agreeable with the plan.    Past Medical History:  Diagnosis Date   Asthma    GERD (gastroesophageal reflux disease)    HTN (hypertension)     Past Surgical History:  Procedure Laterality Date   dental procedure      Allergies: Shellfish allergy and Tomato  Medications: Prior to Admission medications   Medication Sig Start Date End Date Taking? Authorizing Provider  pantoprazole  (PROTONIX ) 40 MG tablet Take 1 tablet (40 mg total) by mouth daily. 04/18/24 04/18/25 Yes Bradler, Evan K, MD  promethazine  (PHENERGAN ) 12.5 MG tablet Take 1 tablet (12.5 mg total) by mouth every 6 (six) hours as needed for up to 5 days for nausea or vomiting. 02/29/24 03/05/24  Levander Slate, MD     Family History  Problem Relation Age of Onset    Hypertension Maternal Grandmother    Diabetes Maternal Grandmother     Social History   Socioeconomic History   Marital status: Significant Other    Spouse name: Not on file   Number of children: Not on file   Years of education: Not on file   Highest education level: Not on file  Occupational History   Not on file  Tobacco Use   Smoking status: Former    Types: E-cigarettes   Smokeless tobacco: Never  Vaping Use   Vaping status: Former  Substance and Sexual Activity   Alcohol use: Not Currently    Comment: social   Drug use: Yes    Types: Marijuana   Sexual activity: Yes    Partners: Female    Birth control/protection: None  Other Topics Concern   Not on file  Social History Narrative   Not on file   Social Drivers of Health   Financial Resource Strain: Not on file  Food Insecurity: No Food Insecurity (05/16/2024)   Hunger Vital Sign    Worried About Running Out of Food in the Last Year: Never true    Ran Out of Food in the Last Year: Never true  Transportation Needs: No Transportation Needs (05/16/2024)   PRAPARE - Administrator, Civil Service (Medical): No    Lack of Transportation (Non-Medical): No  Physical Activity: Not on file  Stress: Not on file  Social Connections: Not on file    Review of Systems: A 12 point ROS discussed and pertinent positives are  indicated in the HPI above.  All other systems are negative.  Review of Systems  Constitutional:  Negative for fatigue and fever.  HENT:  Positive for sore throat. Negative for congestion.        Neck pain & swelling  Respiratory:  Negative for cough and shortness of breath.   Gastrointestinal:  Negative for abdominal pain, diarrhea, nausea and vomiting.    Vital Signs: BP (!) 160/102 (BP Location: Left Arm)   Pulse (!) 57   Temp 98.2 F (36.8 C)   Resp 17   Ht 5' 2.5 (1.588 m)   Wt 216 lb 3.2 oz (98.1 kg)   SpO2 100%   BMI 38.91 kg/m     Physical Exam Vitals and nursing note  reviewed.  Constitutional:      Appearance: She is well-developed.  HENT:     Head: Normocephalic and atraumatic.     Mouth/Throat:     Mouth: Mucous membranes are dry.  Eyes:     Conjunctiva/sclera: Conjunctivae normal.  Neck:     Comments: Neck swelling bilateral.  Cardiovascular:     Rate and Rhythm: Normal rate.  Pulmonary:     Effort: Pulmonary effort is normal.  Musculoskeletal:        General: Normal range of motion.  Skin:    General: Skin is warm and dry.  Neurological:     General: No focal deficit present.     Mental Status: She is alert and oriented to person, place, and time. Mental status is at baseline.  Psychiatric:        Mood and Affect: Mood normal.        Thought Content: Thought content normal.        Judgment: Judgment normal.     Imaging: CT Soft Tissue Neck W Contrast Result Date: 05/16/2024 EXAM: CT NECK WITH CONTRAST 05/16/2024 09:34:18 AM TECHNIQUE: CT of the neck was performed with the administration of intravenous contrast. Multiplanar reformatted images are provided for review. Automated exposure control, iterative reconstruction, and/or weight based adjustment of the mA/kV was utilized to reduce the radiation dose to as low as reasonably achievable. COMPARISON: CT of the neck dated 04/19/2024. CLINICAL HISTORY: Soft tissue infection suspected, neck, xray done. Pt to ED via POV from home. Pt ambulatory to triage. Pt reports lump on right side of neck that started a few days ago. Pt reports seen on 8/19 for a lump on the left side that went away. Pt reports pain is worse this time. Pt was admitted on 8/19 for cervical lymphadenitis. FINDINGS: AERODIGESTIVE TRACT: No discrete mass. No edema. An area of nodular thickening in the right nasopharynx seen on image 18 of series 2 appears to have mildly worsened in the interim from approximately 2.4 x 1.9 cm to approximately 2.7 x 2.2 cm. SALIVARY GLANDS: The parotid and submandibular glands are unremarkable.  THYROID : Unremarkable. LYMPH NODES: There has been a mixed response to interval therapy. Large suppurative left-sided level 2 and 3 lymph nodes have significantly decreased in size in the interim and there has been interval resolution of surrounding inflammatory stranding. A left level 2 node which previously measured 3.4 x 3.3 x 3.9 cm now measures approximately 2.9 x 2.4 x 2.8 cm. A left level 3 node which previously measured 3.5 x 2.9 x 4.0 cm now measures approximately 2.8 x 2.3 x 3.2 cm. However, a primarily solid right cervical node which previously measured approximately 3.6 x 3.3 x 5.8 cm now measures approximately 4.0 x  3.9 x 6.5 cm. A right retropharyngeal node which was previously heterogeneous in attenuation is now diffusely cystic. Had previously measured approximately 2.4 x 2.1 x 3.7 cm and now measures approximately 2.6 x 2.3 x 4.4 cm. There are several additional mildly enlarged cervical lymph nodes which have not changed significantly in the interim. SOFT TISSUES: No mass or fluid collection. BRAIN, ORBITS, SINUSES AND MASTOIDS: No acute abnormality. LUNGS AND MEDIASTINUM: No acute abnormality. BONES: No focal bone abnormality. IMPRESSION: 1. Mixed response to interval therapy for cervical lymphadenitis. 2. Significant decrease in size of large suppurative left-sided level 2 and 3 lymph nodes with interval resolution of surrounding inflammatory stranding. 3. Increase in size of a primarily solid right cervical node and a right retropharyngeal node, now diffusely cystic. 4. Mild worsening of nodular thickening in the right nasopharynx. An atypical organism or underlying neoplasm cannot be definitively excluded. Continued follow-up to ensure resolution is strongly advised. Electronically signed by: Evalene Coho MD 05/16/2024 10:21 AM EDT RP Workstation: [id number]   US  FINE NEEDLE ASP 1ST LESION Result Date: 04/20/2024 INDICATION: Acute cervical lymphadenitis. EXAM: ULTRASOUND GUIDED NEEDLE  ASPIRATE BIOPSY OF LEFT CERVICAL LYMPH NODE MEDICATIONS: None. ANESTHESIA/SEDATION: Moderate (conscious) sedation was employed during this procedure. A total of Versed  2.0 mg and Fentanyl  100 mcg was administered intravenously. Moderate Sedation Time: While minutes. The patient's level of consciousness and vital signs were monitored continuously by radiology nursing throughout the procedure under my direct supervision. PROCEDURE: The procedure, risks, benefits, and alternatives were explained to the patient. Questions regarding the procedure were encouraged and answered. The patient understands and consents to the procedure. A time out was performed prior to initiating the procedure. The left neck was prepped with chlorhexidine in a sterile fashion, and a sterile drape was applied covering the operative field. A sterile gown and sterile gloves were used for the procedure. Local anesthesia was provided with 1% Lidocaine . After localizing an enlarged left cervical lymph node, an 18 gauge spinal needle was advanced into the lymph node and aspirate samples obtained. Two separate aspirates were obtained for both cytology and culture studies. The culture sample was combined with approximately 2 mL of sterile saline. Additional ultrasound was performed. COMPLICATIONS: None immediate. FINDINGS: The largest cervical lymph node in the neck is on the left measuring approximately 4.0 x 3.1 x 3.3 cm. Aspiration did not yield free return fluid but with back and forth motion of an 18 gauge needle internally, there was ability to withdrawal approximately 1 mL bloody and turbid fluid for both cytology and culture studies. IMPRESSION: Ultrasound-guided needle aspiration of enlarged left cervical lymph node measuring up to 4 cm in greatest diameter. Needle aspiration yielded bloody and turbid fluid yielding 1 mL for both cytology and culture studies. The culture sample was combined with additional 2 mL of sterile saline.  Electronically Signed   By: Marcey Moan M.D.   On: 04/20/2024 13:24   CT Soft Tissue Neck W Contrast Result Date: 04/19/2024 EXAM: CT NECK WITH CONTRAST 04/19/2024 05:28:47 AM TECHNIQUE: CT of the neck was performed with the administration of intravenous contrast. Multiplanar reformatted images are provided for review. Automated exposure control, iterative reconstruction, and/or weight based adjustment of the mA/kV was utilized to reduce the radiation dose to as low as reasonably achievable. COMPARISON: None available. CLINICAL HISTORY: Soft tissue infection suspected, neck, xray done. Pt c/o sore throat/neck pain x mo; ibuprofen  400mg  taken PTA without relief; pt presents with large amount swelling to left side of neck which  she st has worsened since being here earlier. FINDINGS: AERODIGESTIVE TRACT: There is extensive nasopharyngeal and oropharyngeal soft tissue swelling and enhancement, which is more pronounced on the right. There is an ovoid area of nodular thickening and enhancement within the right nasopharynx on image 21 of series 2 measuring approximately 2.4 x 1.9 cm. There is a cyst on the left on the same image measuring approximately 11 x 7 mm. There is nodular enhancement along the right palatine tonsils on image 29, measuring approximately 2.4 x 2.1 cm. SALIVARY GLANDS: There are few submental and submandibular nodes. THYROID : Unremarkable. LYMPH NODES: There is a conglomerate nodal mass present in the right level 2 and 3 nodal chains, measuring approximately 3.6 x 3.3 x 5.8 cm. There is also a suppurative nodal mass present in the left level 2 nodal chain measuring 3.4 x 3.3 x 4.0 cm. There is also a left level 3 supportive node measuring 3.5 x 2.9 x 4.0 cm. There are additional posterior cervical lymph nodes present bilaterally. SOFT TISSUES: There is adjacent soft tissue stranding. There are inflammatory changes along the left sternocleidomastoid muscle. BRAIN, ORBITS, SINUSES AND MASTOIDS:  There is mild polypoid mucosal disease within the left maxillary sinus. LUNGS AND MEDIASTINUM: No acute abnormality. BONES: No focal bone abnormality. IMPRESSION: 1. Extensive nasopharyngeal and oropharyngeal soft tissue swelling and enhancement, more pronounced on the right, with nodular thickening and enhancement within the right nasopharynx and nodular enhancement along the right palatine tonsils. There is no drainable peritonsillar abscess present. 2. Conglomerate nodal mass in the right level 2 and 3 nodal chains and suppurative nodal mass in the left level 2 nodal chain, with additional supperative node in the left level 3 nodal chain. 3. Inflammatory changes along the left sternocleidomastoid muscle. 4. Retropharyngeal edema. 5. Mild polypoid mucosal disease within the left maxillary sinus. Electronically signed by: Evalene Coho MD 04/19/2024 05:53 AM EDT RP Workstation: [id number]    Labs:  CBC: Recent Labs    04/19/24 0428 04/20/24 0914 05/16/24 0825 05/16/24 1207  WBC 16.8* 21.1* 13.9* 19.5*  HGB 11.6* 11.4* 12.7 11.8*  HCT 36.7 35.9* 40.2 36.7  PLT 433* 438* 393 365    COAGS: Recent Labs    04/20/24 0914  INR 1.1    BMP: Recent Labs    03/05/24 0500 04/19/24 0428 04/20/24 0914 05/16/24 0825 05/16/24 1207  NA 133* 139 138 140  --   K 3.5 3.5 3.6 3.9  --   CL 101 106 105 102  --   CO2 21* 23 22 22   --   GLUCOSE 126* 117* 131* 130*  --   BUN 6 6 7 11   --   CALCIUM 8.7* 9.3 9.3 9.5  --   CREATININE 0.70 0.71 0.61 0.83 0.68  GFRNONAA >60 >60 >60 >60 >60    LIVER FUNCTION TESTS: Recent Labs    02/29/24 0827 03/01/24 1237  BILITOT 0.9 1.1  AST 21 24  ALT 18 20  ALKPHOS 63 59  PROT 7.9 7.7  ALBUMIN 3.9 3.8    TUMOR MARKERS: No results for input(s): AFPTM, CEA, CA199, CHROMGRNA in the last 8760 hours.  Assessment and Plan:  28 y.o. female inpatient. Known to IR.  History of asthma, HTN, cervical lymphadentitis s/p aspiration of enlarged left  cervical lymph node on 8.21.25. Cytology showed mostly necrotic cells.   Presented to the ED at [facility] with right sided neck pain X 1 week.   CT soft tissue neck from 9.16.25 reads . Increase  in size of a primarily solid right cervical node and a right retropharyngeal node, now diffusely cystic. Team is requesting a right sided cervical lymph node aspiration/ core biopsy.   PLAN: IR Image Guided Right Sided Cervical Lymph Node Aspiration / Core Biopsy  Risks and benefits discussed with the patient including bleeding, infection, damage to adjacent structures, bowel perforation/fistula connection, and sepsis.  All of the patient's questions were answered, patient is agreeable to proceed. Consent signed and in chart.   Thank you for this interesting consult.  I greatly enjoyed meeting Brandi [name] and look forward to participating in their care.  A copy of this report was sent to the requesting provider on this date.  Electronically Signed: Delon JAYSON Beagle, NP 05/16/2024, 2:58 PM   I spent a total of 40 Minutes    in face to face in clinical consultation, greater than 50% of which was counseling/coordinating care for right cervical lymph node aspiration.

## 2024-05-17 ENCOUNTER — Inpatient Hospital Stay: Admitting: Radiology

## 2024-05-17 DIAGNOSIS — L040 Acute lymphadenitis of face, head and neck: Secondary | ICD-10-CM | POA: Diagnosis not present

## 2024-05-17 DIAGNOSIS — I96 Gangrene, not elsewhere classified: Secondary | ICD-10-CM

## 2024-05-17 HISTORY — PX: IR US LIVER BIOPSY: IMG936

## 2024-05-17 LAB — COMPREHENSIVE METABOLIC PANEL WITH GFR
ALT: 19 U/L (ref 0–44)
AST: 16 U/L (ref 15–41)
Albumin: 3.5 g/dL (ref 3.5–5.0)
Alkaline Phosphatase: 66 U/L (ref 38–126)
Anion gap: 10 (ref 5–15)
BUN: 10 mg/dL (ref 6–20)
CO2: 23 mmol/L (ref 22–32)
Calcium: 9.1 mg/dL (ref 8.9–10.3)
Chloride: 104 mmol/L (ref 98–111)
Creatinine, Ser: 0.64 mg/dL (ref 0.44–1.00)
GFR, Estimated: >60 mL/min (ref >60)
Glucose, Bld: 131 mg/dL — ABNORMAL HIGH (ref 70–99)
Potassium: 3.7 mmol/L (ref 3.5–5.1)
Sodium: 137 mmol/L (ref 135–145)
Total Bilirubin: 0.7 mg/dL (ref 0.0–1.2)
Total Protein: 7.4 g/dL (ref 6.5–8.1)

## 2024-05-17 LAB — CBC
HCT: 35.3 % — ABNORMAL LOW (ref 36.0–46.0)
Hemoglobin: 11.7 g/dL — ABNORMAL LOW (ref 12.0–15.0)
MCH: 30.1 pg (ref 26.0–34.0)
MCHC: 33.1 g/dL (ref 30.0–36.0)
MCV: 90.7 fL (ref 80.0–100.0)
Platelets: 372 K/uL (ref 150–400)
RBC: 3.89 MIL/uL (ref 3.87–5.11)
RDW: 14.2 % (ref 11.5–15.5)
WBC: 16.6 K/uL — ABNORMAL HIGH (ref 4.0–10.5)
nRBC: 0 % (ref 0.0–0.2)

## 2024-05-17 LAB — PROTIME-INR
INR: 1.1 (ref 0.8–1.2)
Prothrombin Time: 14.8 s (ref 11.4–15.2)

## 2024-05-17 LAB — RPR: RPR Ser Ql: NONREACTIVE

## 2024-05-17 LAB — C-REACTIVE PROTEIN: CRP: 2.4 mg/dL — ABNORMAL HIGH (ref <1.0)

## 2024-05-17 MED ORDER — MIDAZOLAM HCL 2 MG/2ML IJ SOLN
INTRAMUSCULAR | Status: AC
  Filled 2024-05-17: qty 2

## 2024-05-17 MED ORDER — FENTANYL CITRATE (PF) 100 MCG/2ML IJ SOLN
INTRAMUSCULAR | Status: AC
  Filled 2024-05-17: qty 2

## 2024-05-17 MED ORDER — SODIUM CHLORIDE 0.9 % IV SOLN: INTRAVENOUS | Status: DC

## 2024-05-17 MED ORDER — FENTANYL CITRATE (PF) 100 MCG/2ML IJ SOLN
INTRAMUSCULAR | Status: AC | PRN
  Administered 2024-05-17: 25 ug via INTRAVENOUS
  Administered 2024-05-17: 50 ug via INTRAVENOUS

## 2024-05-17 MED ORDER — LIDOCAINE 1 % OPTIME INJ - NO CHARGE
10.0000 mL | Freq: Once | INTRAMUSCULAR | Status: AC
  Administered 2024-05-17: 3 mL
  Filled 2024-05-17: qty 10

## 2024-05-17 MED ORDER — MIDAZOLAM HCL 2 MG/2ML IJ SOLN
INTRAMUSCULAR | Status: AC | PRN
  Administered 2024-05-17: .5 mg via INTRAVENOUS
  Administered 2024-05-17: 1 mg via INTRAVENOUS

## 2024-05-17 NOTE — Progress Notes (Signed)
 Date of Admission:  05/16/2024    ID: Brandi [name] is a 28 y.o. female  Principal Problem:   Cellulitis Active Problems:   HTN (hypertension)   Asthma   GERD (gastroesophageal reflux disease)   Obesity, Class III, BMI 40-49.9 (morbid obesity)   Acute cervical lymphadenitis    Subjective: Patient is feeling better States the pain in the neck area has better She feels the swelling is also better  Medications:   dexamethasone  (DECADRON ) injection  10 mg Intravenous Q6H   enoxaparin  (LOVENOX ) injection  0.5 mg/kg Subcutaneous Q24H   pantoprazole  (PROTONIX ) IV  40 mg Intravenous Q12H    Objective: Vital signs in last 24 hours: Patient Vitals for the past 24 hrs:  BP Temp Temp src Pulse Resp SpO2 Height Weight  05/17/24 0730 (!) 140/83 98.6 F (37 C) -- 84 18 100 % -- --  05/17/24 0358 (!) 128/93 98.2 F (36.8 C) Oral 77 16 99 % -- --  05/16/24 2042 (!) 147/85 98.7 F (37.1 C) -- 76 18 100 % -- --  05/16/24 1622 (!) 137/97 98.6 F (37 C) Oral 64 20 100 % -- --  05/16/24 1356 (!) 160/102 98.2 F (36.8 C) -- (!) 57 17 100 % -- --  05/16/24 1213 -- -- -- 60 -- 100 % -- --  05/16/24 1212 (!) 143/105 -- -- -- -- -- -- --  05/16/24 1145 -- -- -- -- -- -- 5' 2.5 (1.588 m) 98.1 kg      PHYSICAL EXAM:  General: Alert, cooperative, no distress, appears stated age.  Neck: Bilateral enlarged cervical lymph nodes  Back: No CVA tenderness. Lungs: Clear to auscultation bilaterally. No Wheezing or Rhonchi. No rales. Heart: Regular rate and rhythm, no murmur, rub or gallop. Abdomen: Soft, non-tender,not distended. Bowel sounds normal. No masses Extremities: atraumatic, no cyanosis. No edema. No clubbing Skin: No rashes or lesions. Or bruising Lymph: Cervical, supraclavicular normal. Neurologic: Grossly non-focal  Lab Results    Latest Ref Rng & Units 05/17/2024    4:40 AM 05/16/2024   12:07 PM 05/16/2024    8:25 AM  CBC  WBC 4.0 - 10.5 K/uL 16.6  19.5  13.9   Hemoglobin 12.0  - 15.0 g/dL 88.2  88.1  87.2   Hematocrit 36.0 - 46.0 % 35.3  36.7  40.2   Platelets 150 - 400 K/uL 372  365  393        Latest Ref Rng & Units 05/17/2024    4:40 AM 05/16/2024    6:21 PM 05/16/2024   12:07 PM  CMP  Glucose 70 - 99 mg/dL 868     BUN 6 - 20 mg/dL 10     Creatinine 9.55 - 1.00 mg/dL 9.35   9.31   Sodium 864 - 145 mmol/L 137     Potassium 3.5 - 5.1 mmol/L 3.7     Chloride 98 - 111 mmol/L 104     CO2 22 - 32 mmol/L 23     Calcium 8.9 - 10.3 mg/dL 9.1     Total Protein 6.5 - 8.1 g/dL 7.4  8.0    Total Bilirubin 0.0 - 1.2 mg/dL 0.7  0.5    Alkaline Phos 38 - 126 U/L 66  79    AST 15 - 41 U/L 16  21    ALT 0 - 44 U/L 19  23          Studies/Results: CT Soft Tissue Neck W Contrast Result Date: 05/16/2024  EXAM: CT NECK WITH CONTRAST 05/16/2024 09:34:18 AM TECHNIQUE: CT of the neck was performed with the administration of intravenous contrast. Multiplanar reformatted images are provided for review. Automated exposure control, iterative reconstruction, and/or weight based adjustment of the mA/kV was utilized to reduce the radiation dose to as low as reasonably achievable. COMPARISON: CT of the neck dated 04/19/2024. CLINICAL HISTORY: Soft tissue infection suspected, neck, xray done. Pt to ED via POV from home. Pt ambulatory to triage. Pt reports lump on right side of neck that started a few days ago. Pt reports seen on 8/19 for a lump on the left side that went away. Pt reports pain is worse this time. Pt was admitted on 8/19 for cervical lymphadenitis. FINDINGS: AERODIGESTIVE TRACT: No discrete mass. No edema. An area of nodular thickening in the right nasopharynx seen on image 18 of series 2 appears to have mildly worsened in the interim from approximately 2.4 x 1.9 cm to approximately 2.7 x 2.2 cm. SALIVARY GLANDS: The parotid and submandibular glands are unremarkable. THYROID : Unremarkable. LYMPH NODES: There has been a mixed response to interval therapy. Large suppurative  left-sided level 2 and 3 lymph nodes have significantly decreased in size in the interim and there has been interval resolution of surrounding inflammatory stranding. A left level 2 node which previously measured 3.4 x 3.3 x 3.9 cm now measures approximately 2.9 x 2.4 x 2.8 cm. A left level 3 node which previously measured 3.5 x 2.9 x 4.0 cm now measures approximately 2.8 x 2.3 x 3.2 cm. However, a primarily solid right cervical node which previously measured approximately 3.6 x 3.3 x 5.8 cm now measures approximately 4.0 x 3.9 x 6.5 cm. A right retropharyngeal node which was previously heterogeneous in attenuation is now diffusely cystic. Had previously measured approximately 2.4 x 2.1 x 3.7 cm and now measures approximately 2.6 x 2.3 x 4.4 cm. There are several additional mildly enlarged cervical lymph nodes which have not changed significantly in the interim. SOFT TISSUES: No mass or fluid collection. BRAIN, ORBITS, SINUSES AND MASTOIDS: No acute abnormality. LUNGS AND MEDIASTINUM: No acute abnormality. BONES: No focal bone abnormality. IMPRESSION: 1. Mixed response to interval therapy for cervical lymphadenitis. 2. Significant decrease in size of large suppurative left-sided level 2 and 3 lymph nodes with interval resolution of surrounding inflammatory stranding. 3. Increase in size of a primarily solid right cervical node and a right retropharyngeal node, now diffusely cystic. 4. Mild worsening of nodular thickening in the right nasopharynx. An atypical organism or underlying neoplasm cannot be definitively excluded. Continued follow-up to ensure resolution is strongly advised. Electronically signed by: Evalene Coho MD 05/16/2024 10:21 AM EDT RP Workstation: [id number]     Assessment/Plan: Extensive necrotizing cervical lymphadenitis Differential diagnosis: Infectious, autoimmune or malignant Infection could be bacterial but strep antigen negative in the past. 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

## 2024-05-17 NOTE — Procedures (Signed)
 Interventional Radiology Procedure Note  Procedure: US  guided core biopsy with numerous biopsies attained  Complications: None  Estimated Blood Loss: None  Recommendations: - Sent for surg path, bacterial cultures, fungal cultures and AFB   Signed,  Wilkie LOIS Lent, MD

## 2024-05-17 NOTE — Plan of Care (Signed)
   Problem: Education: Goal: Knowledge of General Education information will improve Description: Including pain rating scale, medication(s)/side effects and non-pharmacologic comfort measures Outcome: Progressing   Problem: Pain Managment: Goal: General experience of comfort will improve and/or be controlled Outcome: Progressing   Problem: Safety: Goal: Ability to remain free from injury will improve Outcome: Progressing

## 2024-05-17 NOTE — Progress Notes (Signed)
  Progress Note   Patient: Brandi [name] FMW:[id number] DOB: Apr 16, 1996 DOA: 05/16/2024     1 DOS: the patient was seen and examined on 05/17/2024   Brief hospital course:  Mckinzey Entwistle is a pleasant 28 y.o. female with medical history significant for asthma, GERD, HTN who presented to ED for right-sided neck pain and swelling.  Patient was seen a month ago in the emergency department for left-sided neck swelling, followed up with Dr. Blair of ENT and had a biopsy done.  However, patient has not heard of biopsy results.  At that time she was treated with IV and p.o. antibiotics.  For the last 1 week she started having right-sided neck pain and swelling.  Now she started having difficulty swallowing.  She denies any fever, chills, nausea, vomiting.   ED Course: Upon arrival to the ED, patient is found to be tachycardic at 117, hypertensive around 146/109, CT neck showed significant lymphadenopathy bilaterally with multiple necrotic appearing lymph nodes.  ENT service was consulted Dr. Milissa from ENT will be seeing the patient.  He advised for IV Unasyn  and IV Decadron .  Hospitalist service was consulted for evaluation for admission.   Consults: ENT ID IR    Assessment and Plan:  Right neck cellulitis/acute lymphadenitis  Prior biopsy path showed necrotic cells, no malignancy was detected. Broad differential including infectious, autoimmune or malignant --ID and ENT following --IR to do biopsy today, NPO until after procedure --Continue IV Unasyn  --Continue IV Decadron  --Follow-up cultures --Pain control PRN --Follow pending studies for infectious work up per ID   HTN - appears not on meds --Monitor BP's  GERD --On PPI  Asthma - appears not on meds --PRN bronchodilators      Subjective: Pt awake sitting up in bed on AM rounds.  She reports pain has improved, she was able to swallow again, but now is NPO while awaiting biopsy procedure today.   Physical Exam: Vitals:    05/16/24 2042 05/17/24 0358 05/17/24 0730 05/17/24 1415  BP: (!) 147/85 (!) 128/93 (!) 140/83 (!) 146/89  Pulse: 76 77 84 78  Resp: 18 16 18 18   Temp: 98.7 F (37.1 C) 98.2 F (36.8 C) 98.6 F (37 C) 99.5 F (37.5 C)  TempSrc:  Oral  Oral  SpO2: 100% 99% 100% 99%  Weight:      Height:       General exam: awake, alert, no acute distress HEENT: severely enlarged b/l cervical lymp nodes right larger than left but non-tender on palpation bilaterally  Respiratory system: CTAB, no wheezes, rales or rhonchi, normal respiratory effort. Cardiovascular system: normal S1/S2, RRR,  no pedal edema.   Gastrointestinal system: soft, NT, ND, +bowel sounds. Central nervous system: A&O x3. no gross focal neurologic deficits, normal speech Extremities: moves all , no edema, normal tone Skin: dry, intact, normal temperature Psychiatry: normal mood, congruent affect, judgement and insight appear normal   Data Reviewed:  Notable labs -- glucose 131, WBC improved 19.5 >> 16.65, Hbg stable 11.7 LDH 260 CRP 2.4   Family Communication: None present. Pt updated in detail and is capable to update.  Disposition: Status is: Inpatient Remains inpatient appropriate because: awaiting biopsy procedure and remains on IV antibiotics and IV steroids.  Pending clearance by consultants.   Planned Discharge Destination: Home    Time spent: 45 minutes  Author: Burnard DELENA Cunning, DO 05/17/2024 2:34 PM  For on call review [email or web address].

## 2024-05-17 NOTE — Progress Notes (Signed)
 ..05/17/2024 5:31 PM  Brandi [name] [id number]    Temp:  [97.9 F (36.6 C)-99.5 F (37.5 C)] 97.9 F (36.6 C) (09/17 1630) Pulse Rate:  [70-100] 72 (09/17 1630) Resp:  [11-19] 18 (09/17 1630) BP: (124-151)/(75-108) 139/90 (09/17 1630) SpO2:  [97 %-100 %] 97 % (09/17 1630),     Intake/Output Summary (Last 24 hours) at 05/17/2024 1731 Last data filed at 05/17/2024 1700 Gross per 24 hour  Intake 746.49 ml  Output --  Net 746.49 ml    Results for orders placed or performed during the hospital encounter of 05/16/24 (from the past 24 hours)  C-reactive protein     Status: Abnormal   Collection Time: 05/16/24  6:21 PM  Result Value Ref Range   CRP 2.4 (H) <1.0 mg/dL  Sedimentation rate     Status: Abnormal   Collection Time: 05/16/24  6:21 PM  Result Value Ref Range   Sed Rate 53 (H) 0 - 20 mm/hr  RPR     Status: None   Collection Time: 05/16/24  6:21 PM  Result Value Ref Range   RPR Ser Ql NON REACTIVE NON REACTIVE  Hepatic function panel     Status: None   Collection Time: 05/16/24  6:21 PM  Result Value Ref Range   Total Protein 8.0 6.5 - 8.1 g/dL   Albumin 3.8 3.5 - 5.0 g/dL   AST 21 15 - 41 U/L   ALT 23 0 - 44 U/L   Alkaline Phosphatase 79 38 - 126 U/L   Total Bilirubin 0.5 0.0 - 1.2 mg/dL   Bilirubin, Direct 0.1 0.0 - 0.2 mg/dL   Indirect Bilirubin 0.4 0.3 - 0.9 mg/dL  Lactate dehydrogenase     Status: Abnormal   Collection Time: 05/16/24  6:21 PM  Result Value Ref Range   LDH 260 (H) 98 - 192 U/L  Comprehensive metabolic panel     Status: Abnormal   Collection Time: 05/17/24  4:40 AM  Result Value Ref Range   Sodium 137 135 - 145 mmol/L   Potassium 3.7 3.5 - 5.1 mmol/L   Chloride 104 98 - 111 mmol/L   CO2 23 22 - 32 mmol/L   Glucose, Bld 131 (H) 70 - 99 mg/dL   BUN 10 6 - 20 mg/dL   Creatinine, Ser 9.35 0.44 - 1.00 mg/dL   Calcium 9.1 8.9 - 89.6 mg/dL   Total Protein 7.4 6.5 - 8.1 g/dL   Albumin 3.5 3.5 - 5.0 g/dL   AST 16 15 - 41 U/L   ALT 19 0 - 44  U/L   Alkaline Phosphatase 66 38 - 126 U/L   Total Bilirubin 0.7 0.0 - 1.2 mg/dL   GFR, Estimated >39 >39 mL/min   Anion gap 10 5 - 15  CBC     Status: Abnormal   Collection Time: 05/17/24  4:40 AM  Result Value Ref Range   WBC 16.6 (H) 4.0 - 10.5 K/uL   RBC 3.89 3.87 - 5.11 MIL/uL   Hemoglobin 11.7 (L) 12.0 - 15.0 g/dL   HCT 64.6 (L) 63.9 - 53.9 %   MCV 90.7 80.0 - 100.0 fL   MCH 30.1 26.0 - 34.0 pg   MCHC 33.1 30.0 - 36.0 g/dL   RDW 85.7 88.4 - 84.4 %   Platelets 372 150 - 400 K/uL   nRBC 0.0 0.0 - 0.2 %  Protime-INR     Status: None   Collection Time: 05/17/24  4:40 AM  Result Value Ref  Range   Prothrombin Time 14.8 11.4 - 15.2 seconds   INR 1.1 0.8 - 1.2    SUBJECTIVE:  No acute events.  Tolerated PO yesterday and NPO today for procedure by IR.  REports significantly improved pain and mobility of head/neck  OBJECTIVE:  GEN- NAD, sitting upright in bed watching a movie EARS- clear externally NOSE- clear anteriorly OC/OP-  improved edema and erythema of tonsils, still has some mild swelling posterior to right tonsils along posterior pharynx.  Uvula midline.  Significantly improved trismus NECK- continued tender right lymph node that is enlarged but significantly improved in size and more defined.  Continued left LAD as well. RESP- unlabored CARD-  RRR  IMPRESSION:  Cervical lymphadenitis  PLAN:  Appreciated ID input and following results of their testing.  IR today with biopsy and will follow those results of biopsies as well.  If no definitive diagnosis from those biopsies may need an excisional biopsy in OR later this week.  Discussed with patient who understands with plan.  Brandi [name] 05/17/2024, 5:31 PM

## 2024-05-18 DIAGNOSIS — L040 Acute lymphadenitis of face, head and neck: Secondary | ICD-10-CM | POA: Diagnosis not present

## 2024-05-18 LAB — CBC
HCT: 36.5 % (ref 36.0–46.0)
Hemoglobin: 11.7 g/dL — ABNORMAL LOW (ref 12.0–15.0)
MCH: 29.8 pg (ref 26.0–34.0)
MCHC: 32.1 g/dL (ref 30.0–36.0)
MCV: 92.9 fL (ref 80.0–100.0)
Platelets: 425 K/uL — ABNORMAL HIGH (ref 150–400)
RBC: 3.93 MIL/uL (ref 3.87–5.11)
RDW: 14.3 % (ref 11.5–15.5)
WBC: 20.2 K/uL — ABNORMAL HIGH (ref 4.0–10.5)
nRBC: 0 % (ref 0.0–0.2)

## 2024-05-18 LAB — TOXOPLASMA ANTIBODIES- IGG AND  IGM
Toxoplasma Antibody- IgM: <3 [AU]/ml (ref 0.0–7.9)
Toxoplasma IgG Ratio: <3 [IU]/mL (ref 0.0–7.1)

## 2024-05-18 LAB — ANA COMPREHENSIVE PANEL

## 2024-05-18 MED ORDER — OXYCODONE HCL 5 MG PO TABS
5.0000 mg | ORAL_TABLET | ORAL | Status: DC | PRN
  Administered 2024-05-18 – 2024-05-19 (×2): 5 mg via ORAL
  Filled 2024-05-18 (×2): qty 1

## 2024-05-18 MED ORDER — PANTOPRAZOLE SODIUM 40 MG PO TBEC
40.0000 mg | DELAYED_RELEASE_TABLET | Freq: Every day | ORAL | Status: DC
Start: 2024-05-19 — End: 2024-05-19
  Administered 2024-05-19: 40 mg via ORAL
  Filled 2024-05-18: qty 1

## 2024-05-18 MED ORDER — SENNOSIDES-DOCUSATE SODIUM 8.6-50 MG PO TABS
1.0000 | ORAL_TABLET | Freq: Two times a day (BID) | ORAL | Status: DC
  Administered 2024-05-18 (×2): 1 via ORAL
  Filled 2024-05-18 (×3): qty 1

## 2024-05-18 NOTE — Progress Notes (Signed)
 Progress Note   Patient: Brandi [name] FMW:[id number] DOB: 1996-01-28 DOA: 05/16/2024     2 DOS: the patient was seen and examined on 05/18/2024   Brief hospital course:  Bert Givans is a pleasant 28 y.o. female with medical history significant for asthma, GERD, HTN who presented to ED for right-sided neck pain and swelling.  Patient was seen a month ago in the emergency department for left-sided neck swelling, followed up with Dr. Blair of ENT and had a biopsy done.  However, patient has not heard of biopsy results.  At that time she was treated with IV and p.o. antibiotics.  For the last 1 week she started having right-sided neck pain and swelling.  Now she started having difficulty swallowing.  She denies any fever, chills, nausea, vomiting.   ED Course: Upon arrival to the ED, patient is found to be tachycardic at 117, hypertensive around 146/109, CT neck showed significant lymphadenopathy bilaterally with multiple necrotic appearing lymph nodes.  ENT service was consulted Dr. Milissa from ENT will be seeing the patient.  He advised for IV Unasyn  and IV Decadron .  Hospitalist service was consulted for evaluation for admission.   Consults: ENT ID IR    Assessment and Plan:  Right neck cellulitis/acute lymphadenitis  Prior biopsy path showed necrotic cells, no malignancy was detected. Broad differential including infectious, autoimmune or malignant Autoimmune/ANA panel was negative. Toxoplasma antibodies negative. RPR non-reactive.  --ID and ENT following --IR did core biopsies on 9/17 --Follow biopsy path results, cultures and ID labs pending --Continue IV Unasyn  --Continue IV Decadron  --Follow-up cultures --Pain control PRN   HTN - appears not on meds --Monitor BP's  GERD --On PPI  Asthma - appears not on meds --PRN bronchodilators      Subjective: Pt awake sitting up in bed on AM rounds.  She reports pain since biopsy yesterday but overall it's controlled with  oxycodone .  She reports waking up with hot sweats again overnight.  Swallowing okay now.  No other acute complaints.    Physical Exam: Vitals:   05/17/24 1630 05/17/24 1938 05/18/24 0428 05/18/24 0828  BP: (!) 139/90 (!) 146/95 133/84 (!) 152/95  Pulse: 72 91 65 87  Resp: 18 18 17 16   Temp: 97.9 F (36.6 C) 99.2 F (37.3 C) 99.1 F (37.3 C) 99.2 F (37.3 C)  TempSrc:  Oral Oral   SpO2: 97% 97% 95% 92%  Weight:      Height:       General exam: awake, alert, no acute distress HEENT: dressing over right lateral neck, severely enlarged b/l cervical lymp nodes right larger than left but non-tender on palpation bilaterally  Respiratory system: on room air, normal respiratory effort. Cardiovascular system: RRR,  no pedal edema.   Central nervous system: A&O x3. no gross focal neurologic deficits, normal speech Extremities: moves all , no edema, normal tone Skin: dry, intact, normal temperature Psychiatry: normal mood, congruent affect, judgement and insight appear normal   Data Reviewed:  Notable labs - WBC 16.6 >> 20.2 (on steroids and day 1 post-biopsy), Hbg stable 11.7   Family Communication: None present. Pt updated in detail and is capable to update.  Disposition: Status is: Inpatient Remains inpatient appropriate because: awaiting biopsy path and lab results and remains on IV antibiotics and IV steroids.  Pending clearance by consultants.   Planned Discharge Destination: Home    Time spent: 38 minutes  Author: Burnard DELENA Cunning, DO 05/18/2024 1:15 PM  For on call review [email or web address].

## 2024-05-18 NOTE — Progress Notes (Addendum)
 Date of Admission:  05/16/2024    ID: Brandi [name] is a 28 y.o. female  Principal Problem:   Cellulitis Active Problems:   HTN (hypertension)   Asthma   GERD (gastroesophageal reflux disease)   Obesity, Class III, BMI 40-49.9 (morbid obesity)   Acute cervical lymphadenitis    Subjective: Patient is feeling better States the pain is on the right side more because of the biopsy Otherwise she feels fine  Medications:   enoxaparin  (LOVENOX ) injection  0.5 mg/kg Subcutaneous Q24H   [START ON 05/19/2024] pantoprazole   40 mg Oral Daily   senna-docusate  1 tablet Oral BID    Objective: Vital signs in last 24 hours: Patient Vitals for the past 24 hrs:  BP Temp Temp src Pulse Resp SpO2  05/18/24 1457 (!) 145/92 98.1 F (36.7 C) -- 77 18 93 %  05/18/24 0828 (!) 152/95 99.2 F (37.3 C) -- 87 16 92 %  05/18/24 0428 133/84 99.1 F (37.3 C) Oral 65 17 95 %  05/17/24 1938 (!) 146/95 99.2 F (37.3 C) Oral 91 18 97 %  05/17/24 1630 (!) 139/90 97.9 F (36.6 C) -- 72 18 97 %      PHYSICAL EXAM:  General: Alert, cooperative, no distress, appears stated age.  Neck: Bilateral enlarged cervical lymph nodes  Back: No CVA tenderness. Lungs: Clear to auscultation bilaterally. No Wheezing or Rhonchi. No rales. Heart: Regular rate and rhythm, no murmur, rub or gallop. Abdomen: Soft, non-tender,not distended. Bowel sounds normal. No masses Extremities: atraumatic, no cyanosis. No edema. No clubbing Skin: No rashes or lesions. Or bruising Lymph: Cervical, supraclavicular normal. Neurologic: Grossly non-focal  Lab Results    Latest Ref Rng & Units 05/18/2024    4:11 AM 05/17/2024    4:40 AM 05/16/2024   12:07 PM  CBC  WBC 4.0 - 10.5 K/uL 20.2  16.6  19.5   Hemoglobin 12.0 - 15.0 g/dL 88.2  88.2  88.1   Hematocrit 36.0 - 46.0 % 36.5  35.3  36.7   Platelets 150 - 400 K/uL 425  372  365        Latest Ref Rng & Units 05/17/2024    4:40 AM 05/16/2024    6:21 PM 05/16/2024   12:07 PM  CMP   Glucose 70 - 99 mg/dL 868     BUN 6 - 20 mg/dL 10     Creatinine 9.55 - 1.00 mg/dL 9.35   9.31   Sodium 864 - 145 mmol/L 137     Potassium 3.5 - 5.1 mmol/L 3.7     Chloride 98 - 111 mmol/L 104     CO2 22 - 32 mmol/L 23     Calcium 8.9 - 10.3 mg/dL 9.1     Total Protein 6.5 - 8.1 g/dL 7.4  8.0    Total Bilirubin 0.0 - 1.2 mg/dL 0.7  0.5    Alkaline Phos 38 - 126 U/L 66  79    AST 15 - 41 U/L 16  21    ALT 0 - 44 U/L 19  23          Studies/Results: IR LYMPH NODE CORE BIOPSY Result Date: 05/17/2024 INDICATION: 28 year old female with pathologic right submandibular lymphadenopathy. She presents for ultrasound-guided core biopsy. EXAM: ULTRASOUND LYMPH NODE BIOPSY MEDICATIONS: None. ANESTHESIA/SEDATION: Moderate (conscious) sedation was employed during this procedure. A total of Versed  1.5 mg and Fentanyl  75 mcg was administered intravenously. Moderate Sedation Time: 13 minutes. The patient's level of consciousness and  vital signs were monitored continuously by radiology nursing throughout the procedure under my direct supervision. FLUOROSCOPY TIME:  None. COMPLICATIONS: None immediate. PROCEDURE: Informed written consent was obtained from the patient after a thorough discussion of the procedural risks, benefits and alternatives. All questions were addressed. Maximal Sterile Barrier Technique was utilized including caps, mask, sterile gowns, sterile gloves, sterile drape, hand hygiene and skin antiseptic. A timeout was performed prior to the initiation of the procedure. The right submandibular space was interrogated with ultrasound. A large hypoechoic lymph node is visualized. A suitable skin entry site was selected and marked. Local anesthesia was attained by infiltration with 1% lidocaine . A small dermatotomy was made. Under real-time ultrasound guidance, a numerous 18 gauge core biopsies were obtained. The biopsy specimens were fragmented and miniscule due to the necrotic nature of the lymph  node. At least 10 biopsy passes were obtained. The biopsy specimens were divided between formalin and a saline moistened Telfa pad and then delivered to pathology for further analysis. Post biopsy imaging demonstrates no evidence of immediate complication. IMPRESSION: Successful ultrasound-guided core biopsy of right submandibular lymph node. Electronically Signed   By: Wilkie Lent M.D.   On: 05/17/2024 16:02     Assessment/Plan: Extensive necrotizing cervical lymphadenitis The differential diagnosis extensive This could be infectious versus autoimmune or malignant She did have a needle aspiration during her last hospitalization it was negative for any bacterial infection There was necrotic debris and atypical cells This time she has undergone core biopsy from the right lymph node Differential diagnosis: I  Atypical Mycobacteria/( TB less likely) Fungal like Histo less likely  viral organisms like EBV CMV HIV is less likely The EBV IgG was positive at 600 but IgM was negative so unlikely this is an acute infection  EBV PCR-P CMV PCR_P RPR-NR Bartonella antibodies-negative Toxoplasma antibodies-negative QuantiFERON gold pending Histoplasma  LDH mild elevation   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 Augmentin .  Discussed the management with the patient and ENT surgeon.

## 2024-05-18 NOTE — Progress Notes (Signed)
..  05/18/2024 7:06 PM  Brandi [name] [id number]    Temp:  [98.1 F (36.7 C)-99.2 F (37.3 C)] 98.1 F (36.7 C) (09/18 1457) Pulse Rate:  [65-91] 77 (09/18 1457) Resp:  [16-18] 18 (09/18 1457) BP: (133-152)/(84-95) 145/92 (09/18 1457) SpO2:  [92 %-97 %] 93 % (09/18 1457),     Intake/Output Summary (Last 24 hours) at 05/18/2024 1906 Last data filed at 05/17/2024 1934 Gross per 24 hour  Intake 99.36 ml  Output --  Net 99.36 ml    Results for orders placed or performed during the hospital encounter of 05/16/24 (from the past 24 hours)  CBC     Status: Abnormal   Collection Time: 05/18/24  4:11 AM  Result Value Ref Range   WBC 20.2 (H) 4.0 - 10.5 K/uL   RBC 3.93 3.87 - 5.11 MIL/uL   Hemoglobin 11.7 (L) 12.0 - 15.0 g/dL   HCT 63.4 63.9 - 53.9 %   MCV 92.9 80.0 - 100.0 fL   MCH 29.8 26.0 - 34.0 pg   MCHC 32.1 30.0 - 36.0 g/dL   RDW 85.6 88.4 - 84.4 %   Platelets 425 (H) 150 - 400 K/uL   nRBC 0.0 0.0 - 0.2 %    SUBJECTIVE:  Significant improvement in pain and voice per patient.  Tolerating a normal diet.    OBJECTIVE:  GEN-  NAD, sitting upright in bed eating NOSE- clear anteriorly OC/OP-  Tonsil edema has improved.  Improved right pharyngeal fullness.  Widely patent airway NECK-  right neck lymphadenopathy improved and more defined.  Less tender.  Left neck lymphadenopathy stable  IMPRESSION:  Cerivcal lymphadenitis  PLAN:  Culture negative so far with no organisms nor WBCs on gram stain.  Cytology pending.  Patient is improving.  Plan is most likely to discharge home tomorrow with plan for excisional biopsy as outpatient early next week if US  guided core biopsies do not yield answers.  Appreciate Medicine/ID assistance.  [name] 05/18/2024, 7:06 PM

## 2024-05-19 ENCOUNTER — Other Ambulatory Visit: Payer: Self-pay | Admitting: *Deleted

## 2024-05-19 ENCOUNTER — Other Ambulatory Visit: Payer: Self-pay | Admitting: Otolaryngology

## 2024-05-19 DIAGNOSIS — C119 Malignant neoplasm of nasopharynx, unspecified: Secondary | ICD-10-CM | POA: Diagnosis not present

## 2024-05-19 LAB — SURGICAL PATHOLOGY

## 2024-05-19 LAB — CBC
HCT: 32.9 % — ABNORMAL LOW (ref 36.0–46.0)
Hemoglobin: 11.1 g/dL — ABNORMAL LOW (ref 12.0–15.0)
MCH: 30.5 pg (ref 26.0–34.0)
MCHC: 33.7 g/dL (ref 30.0–36.0)
MCV: 90.4 fL (ref 80.0–100.0)
Platelets: 360 K/uL (ref 150–400)
RBC: 3.64 MIL/uL — ABNORMAL LOW (ref 3.87–5.11)
RDW: 14.1 % (ref 11.5–15.5)
WBC: 19.3 K/uL — ABNORMAL HIGH (ref 4.0–10.5)
nRBC: 0 % (ref 0.0–0.2)

## 2024-05-19 MED ORDER — PREDNISONE 10 MG (21) PO TBPK: ORAL_TABLET | ORAL | 0 refills | Status: DC

## 2024-05-19 MED ORDER — SENNOSIDES-DOCUSATE SODIUM 8.6-50 MG PO TABS: 1.0000 | ORAL_TABLET | Freq: Two times a day (BID) | ORAL | 0 refills | Status: DC

## 2024-05-19 MED ORDER — OXYCODONE HCL 5 MG PO TABS: 5.0000 mg | ORAL_TABLET | Freq: Four times a day (QID) | ORAL | 0 refills | Status: DC | PRN

## 2024-05-19 MED ORDER — ACETAMINOPHEN 325 MG PO TABS: 650.0000 mg | ORAL_TABLET | Freq: Four times a day (QID) | ORAL | Status: DC | PRN

## 2024-05-19 NOTE — Progress Notes (Signed)
 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  Discussed this with patient and that cervical lymphadenopathy is not from infectious source but cancerous.  Recommend continuing prednisone  as it helps with edema and symptoms but no need for antibiotics.  Next step will be PET and Oncology referral.  Discussed that treatment is not surgical but will most likely be combination of Radiation and Chemotherapy over next several months.  Will schedule outpatient PET.  Per Hospitalist, Oncology already aware.  Patient may need some pain medication given diagnosis and anticipated longer duration until improvement in symptoms than infectious source.  Follow up with me next week still.  Patient demonstrated understanding.

## 2024-05-19 NOTE — Consult Note (Signed)
 [facility]  [phone] [phone]  ID: Brandi Brandi [name] OB: [birth year]  MR#: [id number]  RDW#:[id number]  Patient Care Team: Brandi Bare, Brandi Brandi [name] as PCP - General (Internal Medicine)  CHIEF COMPLAINT: Nasopharyngeal carcinoma, EBV positive.  INTERVAL HISTORY: Patient is Brandi [name] 28 year old female who recently presented to the emergency room with neck pain and swelling and is being treated for acute lymphadenitis.  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  She continues to have neck pain and is anxious, but otherwise feels well.  She has no neurologic complaints.  She denies any recent fevers.  She has Brandi [name] fair appetite, but denies weight loss.  She has no chest pain, shortness of breath, cough, or hemoptysis.  She denies any nausea, vomiting, constipation, or diarrhea.  She has no urinary complaints.  Patient offers no further specific complaints today.  REVIEW OF SYSTEMS:   Review of Systems  Constitutional: Negative.  Negative for fever, malaise/fatigue and weight loss.  Respiratory: Negative.  Negative for cough, hemoptysis and shortness of breath.   Cardiovascular: Negative.  Negative for chest pain and leg swelling.  Gastrointestinal: Negative.  Negative for abdominal pain.  Genitourinary: Negative.  Negative for dysuria.  Musculoskeletal:  Positive for neck pain.  Skin: Negative.  Negative for rash.  Neurological: Negative.  Negative for dizziness, focal weakness, weakness and headaches.  Psychiatric/Behavioral:  The patient is nervous/anxious.     As per HPI. Otherwise, Brandi [name] complete review of systems is negative.  PAST MEDICAL HISTORY: Past Medical History:  Diagnosis Date   Asthma    GERD (gastroesophageal reflux disease)    HTN (hypertension)     PAST SURGICAL HISTORY: Past Surgical History:  Procedure Laterality Date   dental procedure     IR US  LIVER BIOPSY  05/17/2024    FAMILY  HISTORY: Family History  Problem Relation Age of Onset   Hypertension Maternal Grandmother    Diabetes Maternal Grandmother     ADVANCED DIRECTIVES (Y/N):  @ADVDIR @  HEALTH MAINTENANCE: Social History   Tobacco Use   Smoking status: Former    Types: E-cigarettes   Smokeless tobacco: Never  Vaping Use   Vaping status: Former  Substance Use Topics   Alcohol use: Not Currently    Comment: social   Drug use: Yes    Types: Marijuana     Colonoscopy:  PAP:  Bone density:  Lipid panel:  Allergies  Allergen Reactions   Shellfish Allergy Anaphylaxis   Tomato Other (See Comments)    Flares up exema    Current Facility-Administered Medications  Medication Dose Route Frequency Provider Last Rate Last Admin   acetaminophen  (TYLENOL ) tablet 650 mg  650 mg Oral Q6H PRN Paudel, Nena, Brandi Brandi [name]       Or   acetaminophen  (TYLENOL ) suppository 650 mg  650 mg Rectal Q6H PRN Paudel, Keshab, Brandi Brandi [name]       Ampicillin -Sulbactam (UNASYN ) 3 g in sodium chloride  0.9 % 100 mL IVPB  3 g Intravenous Q6H Paudel, Keshab, Brandi Brandi [name] 200 mL/hr at 05/19/24 0635 3 g at 05/19/24 9364   enoxaparin  (LOVENOX ) injection 50 mg  0.5 mg/kg Subcutaneous Q24H Paudel, Keshab, Brandi Brandi [name]   50 mg at 05/18/24 2152   ipratropium-albuterol  (DUONEB) 0.5-2.5 (3) MG/3ML nebulizer solution 3 mL  3 mL Nebulization Q6H PRN Paudel, Keshab, Brandi Brandi [name]       ondansetron  (ZOFRAN ) tablet 4 mg  4 mg Oral Q6H PRN Brandi Nena, Brandi Brandi [name]       Or  ondansetron  (ZOFRAN ) injection 4 mg  4 mg Intravenous Q6H PRN Paudel, Keshab, Brandi Brandi [name]   4 mg at 05/19/24 0859   oxyCODONE  (Oxy IR/ROXICODONE ) immediate release tablet 5 mg  5 mg Oral Q4H PRN Brandi Sor Brandi [name], Brandi Brandi [name]   5 mg at 05/19/24 0853   pantoprazole  (PROTONIX ) EC tablet 40 mg  40 mg Oral Daily Brandi Sor Brandi [name], Brandi Brandi [name]   40 mg at 05/19/24 0854   polyethylene glycol (MIRALAX  / GLYCOLAX ) packet 17 g  17 g Oral Daily PRN Brandi Gouty, Brandi Brandi [name]       senna-docusate (Senokot-S) tablet 1 tablet  1 tablet Oral BID Brandi Sor Brandi [name], Brandi Brandi [name]   1  tablet at 05/18/24 2151    OBJECTIVE: Vitals:   05/19/24 0547 05/19/24 0939  BP: (!) 148/97 (!) 146/92  Pulse: 74 63  Resp: 18 18  Temp: 98.4 F (36.9 C) 98.5 F (36.9 C)  SpO2: 94% 100%     Body mass index is 38.91 kg/m.    ECOG FS:1 - Symptomatic but completely ambulatory  General: Well-developed, well-nourished, no acute distress. Eyes: Pink conjunctiva, anicteric sclera. HEENT: Normocephalic, moist mucous membranes. Lungs: No audible wheezing or coughing. Heart: Regular rate and rhythm. Abdomen: Soft, nontender, no obvious distention. Musculoskeletal: No edema, cyanosis, or clubbing. Neuro: Alert, answering all questions appropriately. Cranial nerves grossly intact. Skin: No rashes or petechiae noted. Psych: Normal affect. Lymphatics: No cervical, calvicular, axillary or inguinal LAD.   LAB RESULTS:  Lab Results  Component Value Date   NA 137 05/17/2024   K 3.7 05/17/2024   CL 104 05/17/2024   CO2 23 05/17/2024   GLUCOSE 131 (H) 05/17/2024   BUN 10 05/17/2024   CREATININE 0.64 05/17/2024   CALCIUM 9.1 05/17/2024   PROT 7.4 05/17/2024   ALBUMIN 3.5 05/17/2024   AST 16 05/17/2024   ALT 19 05/17/2024   ALKPHOS 66 05/17/2024   BILITOT 0.7 05/17/2024   GFRNONAA >60 05/17/2024   GFRAA >60 12/05/2019    Lab Results  Component Value Date   WBC 19.3 (H) 05/19/2024   NEUTROABS 19.3 (H) 04/20/2024   HGB 11.1 (L) 05/19/2024   HCT 32.9 (L) 05/19/2024   MCV 90.4 05/19/2024   PLT 360 05/19/2024     STUDIES: IR LYMPH NODE CORE BIOPSY Result Date: 05/17/2024 INDICATION: 28 year old female with pathologic right submandibular lymphadenopathy. She presents for ultrasound-guided core biopsy. EXAM: ULTRASOUND LYMPH NODE BIOPSY MEDICATIONS: None. ANESTHESIA/SEDATION: Moderate (conscious) sedation was employed during this procedure. Brandi [name] total of Versed  1.5 mg and Fentanyl  75 mcg was administered intravenously. Moderate Sedation Time: 13 minutes. The patient's level of  consciousness and vital signs were monitored continuously by radiology nursing throughout the procedure under my direct supervision. FLUOROSCOPY TIME:  None. COMPLICATIONS: None immediate. PROCEDURE: Informed written consent was obtained from the patient after Brandi [name] thorough discussion of the procedural risks, benefits and alternatives. All questions were addressed. Maximal Sterile Barrier Technique was utilized including caps, mask, sterile gowns, sterile gloves, sterile drape, hand hygiene and skin antiseptic. Brandi [name] timeout was performed prior to the initiation of the procedure. The right submandibular space was interrogated with ultrasound. Brandi [name] large hypoechoic lymph node is visualized. Brandi [name] suitable skin entry site was selected and marked. Local anesthesia was attained by infiltration with 1% lidocaine . Brandi [name] small dermatotomy was made. Under real-time ultrasound guidance, Brandi [name] numerous 18 gauge core biopsies were obtained. The biopsy specimens were fragmented and miniscule due to the necrotic nature of the lymph node. At least 10 biopsy passes were obtained. The biopsy  specimens were divided between formalin and Brandi [name] saline moistened Telfa pad and then delivered to pathology for further analysis. Post biopsy imaging demonstrates no evidence of immediate complication. IMPRESSION: Successful ultrasound-guided core biopsy of right submandibular lymph node. Electronically Signed   By: Brandi Brandi [name] M.D.   On: 05/17/2024 16:02   CT Soft Tissue Neck W Contrast Result Date: 05/16/2024 EXAM: CT NECK WITH CONTRAST 05/16/2024 09:34:18 AM TECHNIQUE: CT of the neck was performed with the administration of intravenous contrast. Multiplanar reformatted images are provided for review. Automated exposure control, iterative reconstruction, and/or weight based adjustment of the mA/kV was utilized to reduce the radiation dose to as low as reasonably achievable. COMPARISON: CT of the neck dated 04/19/2024. CLINICAL HISTORY: Soft tissue infection  suspected, neck, xray done. Pt to ED via POV from home. Pt ambulatory to triage. Pt reports lump on right side of neck that started Brandi [name] few days ago. Pt reports seen on 8/19 for Brandi [name] lump on the left side that went away. Pt reports pain is worse this time. Pt was admitted on 8/19 for cervical lymphadenitis. FINDINGS: AERODIGESTIVE TRACT: No discrete mass. No edema. An area of nodular thickening in the right nasopharynx seen on image 18 of series 2 appears to have mildly worsened in the interim from approximately 2.4 x 1.9 cm to approximately 2.7 x 2.2 cm. SALIVARY GLANDS: The parotid and submandibular glands are unremarkable. THYROID : Unremarkable. LYMPH NODES: There has been Brandi [name] mixed response to interval therapy. Large suppurative left-sided level 2 and 3 lymph nodes have significantly decreased in size in the interim and there has been interval resolution of surrounding inflammatory stranding. Brandi [name] left level 2 node which previously measured 3.4 x 3.3 x 3.9 cm now measures approximately 2.9 x 2.4 x 2.8 cm. Brandi [name] left level 3 node which previously measured 3.5 x 2.9 x 4.0 cm now measures approximately 2.8 x 2.3 x 3.2 cm. However, Brandi [name] primarily solid right cervical node which previously measured approximately 3.6 x 3.3 x 5.8 cm now measures approximately 4.0 x 3.9 x 6.5 cm. Brandi [name] right retropharyngeal node which was previously heterogeneous in attenuation is now diffusely cystic. Had previously measured approximately 2.4 x 2.1 x 3.7 cm and now measures approximately 2.6 x 2.3 x 4.4 cm. There are several additional mildly enlarged cervical lymph nodes which have not changed significantly in the interim. SOFT TISSUES: No mass or fluid collection. BRAIN, ORBITS, SINUSES AND MASTOIDS: No acute abnormality. LUNGS AND MEDIASTINUM: No acute abnormality. BONES: No focal bone abnormality. IMPRESSION: 1. Mixed response to interval therapy for cervical lymphadenitis. 2. Significant decrease in size of large suppurative left-sided level 2 and 3  lymph nodes with interval resolution of surrounding inflammatory stranding. 3. Increase in size of Brandi [name] primarily solid right cervical node and Brandi [name] right retropharyngeal node, now diffusely cystic. 4. Mild worsening of nodular thickening in the right nasopharynx. An atypical organism or underlying neoplasm cannot be definitively excluded. Continued follow-up to ensure resolution is strongly advised. Electronically signed by: Brandi Coho Brandi Brandi [name] 05/16/2024 10:21 AM EDT RP Workstation: [id number]   US  FINE NEEDLE ASP 1ST LESION Result Date: 04/20/2024 INDICATION: Acute cervical lymphadenitis. EXAM: ULTRASOUND GUIDED NEEDLE ASPIRATE BIOPSY OF LEFT CERVICAL LYMPH NODE MEDICATIONS: None. ANESTHESIA/SEDATION: Moderate (conscious) sedation was employed during this procedure. Brandi [name] total of Versed  2.0 mg and Fentanyl  100 mcg was administered intravenously. Moderate Sedation Time: While minutes. The patient's level of consciousness and vital signs were monitored continuously by radiology nursing throughout the procedure under my direct supervision. PROCEDURE:  The procedure, risks, benefits, and alternatives were explained to the patient. Questions regarding the procedure were encouraged and answered. The patient understands and consents to the procedure. Brandi [name] time out was performed prior to initiating the procedure. The left neck was prepped with chlorhexidine in Brandi [name] sterile fashion, and Brandi [name] sterile drape was applied covering the operative field. Brandi [name] sterile gown and sterile gloves were used for the procedure. Local anesthesia was provided with 1% Lidocaine . After localizing an enlarged left cervical lymph node, an 18 gauge spinal needle was advanced into the lymph node and aspirate samples obtained. Two separate aspirates were obtained for both cytology and culture studies. The culture sample was combined with approximately 2 mL of sterile saline. Additional ultrasound was performed. COMPLICATIONS: None immediate. FINDINGS: The largest  cervical lymph node in the neck is on the left measuring approximately 4.0 x 3.1 x 3.3 cm. Aspiration did not yield free return fluid but with back and forth motion of an 18 gauge needle internally, there was ability to withdrawal approximately 1 mL bloody and turbid fluid for both cytology and culture studies. IMPRESSION: Ultrasound-guided needle aspiration of enlarged left cervical lymph node measuring up to 4 cm in greatest diameter. Needle aspiration yielded bloody and turbid fluid yielding 1 mL for both cytology and culture studies. The culture sample was combined with additional 2 mL of sterile saline. Electronically Signed   By: Brandi Moan M.D.   On: 04/20/2024 13:24    ASSESSMENT: Nasopharyngeal carcinoma, EBV positive.  PLAN:    Nasopharyngeal carcinoma, EBV positive: CT scan results from May 16, 2024 revealed bilateral lymphadenopathy.  Diagnosis confirmed by lymph node biopsy on biopsy May 17, 2024.  Patient will require PET scan after discharge.  Once PET scan is scheduled, will arrange follow-up in the cancer center with medical oncology and radiation oncology to discuss the results and treatment planning. Pain: Continue dexamethasone  and oxycodone  as prescribed. Disposition: Likely discharge home today.  Appreciate consult, will follow.   Brandi JINNY Reusing, Brandi Brandi [name]   05/19/2024 2:38 PM

## 2024-05-19 NOTE — Plan of Care (Signed)

## 2024-05-19 NOTE — Discharge Summary (Signed)
 Physician Discharge Summary   Patient: Brandi [name] MRN: [id number] DOB: 04/18/1996  Admit date:     05/16/2024  Discharge date: 05/19/2024  Discharge Physician: Burnard DELENA Cunning   PCP: [name], MD   Recommendations at discharge:    Follow up with Oncology as scheduled Outpatient PET scan being scheduled by Oncology Follow up with Primary Care in 1-2 weeks Repeat CBC, CMP at follow up  Discharge Diagnoses: Principal Problem:   Cellulitis Active Problems:   HTN (hypertension)   GERD (gastroesophageal reflux disease)   Asthma   Obesity, Class III, BMI 40-49.9 (morbid obesity) (HCC)   Acute cervical lymphadenitis   Nasopharyngeal carcinoma (HCC)  Resolved Problems:   * No resolved hospital problems. *  Hospital Course:   Brandi [name] is a pleasant 28 y.o. female with medical history significant for asthma, GERD, HTN who presented to ED for right-sided neck pain and swelling.  Patient was seen a month ago in the emergency department for left-sided neck swelling, followed up with Dr. Blair of ENT and had a biopsy done.  However, patient has not heard of biopsy results.  At that time she was treated with IV and p.o. antibiotics.  For the last 1 week she started having right-sided neck pain and swelling.  Now she started having difficulty swallowing.  She denies any fever, chills, nausea, vomiting.   ED Course: Upon arrival to the ED, patient is found to be tachycardic at 117, hypertensive around 146/109, CT neck showed significant lymphadenopathy bilaterally with multiple necrotic appearing lymph nodes.  ENT service was consulted Dr. Milissa from ENT will be seeing the patient.  He advised for IV Unasyn  and IV Decadron .  Hospitalist service was consulted for evaluation for admission.     Consults: ENT ID IR    Assessment and Plan:  Right neck swelling due to recurrent lymphadenitis  Squamous cell carcinoma, Nasopharyngeal carcinoma, EBV+  Prior biopsy path showed  necrotic cells, no malignancy was detected. Core biopsies this admission showed above malignancy. --ID and ENT consulted --IR did core biopsies on 9/17 --Oncology consulted today & met w pt and her partner prior to d/c --Treated with empiric IV Unasyn  and IV Decadron  prior to path results --Pain control PRN     HTN - appears not on meds --Monitor BP's   GERD --On PPI   Asthma - appears not on meds --PRN bronchodilators       Consultants: as above Procedures performed: as above Disposition: Home Diet recommendation:  Regular diet DISCHARGE MEDICATION: Allergies as of 05/19/2024       Reactions   Shellfish Allergy Anaphylaxis   Tomato Other (See Comments)   Flares up exema        Medication List     TAKE these medications    acetaminophen  325 MG tablet Commonly known as: TYLENOL  Take 2 tablets (650 mg total) by mouth every 6 (six) hours as needed for mild pain (pain score 1-3) or fever (or Fever >/= 101).   oxyCODONE  5 MG immediate release tablet Commonly known as: Oxy IR/ROXICODONE  Take 1 tablet (5 mg total) by mouth every 6 (six) hours as needed for moderate pain (pain score 4-6) or severe pain (pain score 7-10).   pantoprazole  40 MG tablet Commonly known as: Protonix  Take 1 tablet (40 mg total) by mouth daily.   predniSONE  10 MG (21) Tbpk tablet Commonly known as: STERAPRED UNI-PAK 21 TAB Take as directed over 6 days. Follow the dosing schedule on the blister pack.  promethazine  12.5 MG tablet Commonly known as: PHENERGAN  Take 1 tablet (12.5 mg total) by mouth every 6 (six) hours as needed for up to 5 days for nausea or vomiting.   senna-docusate 8.6-50 MG tablet Commonly known as: Senokot-S Take 1 tablet by mouth 2 (two) times daily.               Discharge Care Instructions  (From admission, onward)           Start     Ordered   05/19/24 0000  Discharge wound care:       Comments: Follow instructions provided by radiology or ENT    05/19/24 1233            Follow-up Information     Jacobo, Evalene PARAS, MD Follow up.   Specialty: Oncology Why: Their office to schedule your appointment.[facility] information: [address] [place] [address] [phone]         [name], MD. Call.   Specialty: Internal Medicine Why: Hospital follow up in 1-2 weeks Contact information: [address] [place] [address] [phone]         Milissa Hamming, MD Follow up.   Specialty: Otolaryngology Why: Follow up with ENT as scheduled next week Contact information: [address] [place] [address] [phone]                Discharge Exam: Brandi [name]   05/16/24 1145  Weight: 98.1 kg   General exam: awake, alert, no acute distress HEENT: R>L neck swelling with mild tenderness on palpation, moist mucus membranes, hearing grossly normal  Respiratory system: CTAB, no wheezes, rales or rhonchi, normal respiratory effort. Cardiovascular system: normal S1/S2, RRR,  no pedal edema.   Gastrointestinal system: soft, NT, ND, no HSM felt, +bowel sounds. Central nervous system: A&O x 3. no gross focal neurologic deficits, normal speech Extremities: moves all, no edema, normal tone Skin: dry, intact, normal temperature   Condition at discharge: stable  The results of significant diagnostics from this hospitalization (including imaging, microbiology, ancillary and laboratory) are listed below for reference.   Imaging Studies: NM PET Image Initial (PI) Skull Base To Thigh (F-18 FDG) Result Date: 05/26/2024 EXAM: PET AND CT SKULL BASE TO MID THIGH 05/26/2024 11:48:20 AM TECHNIQUE: RADIOPHARMACEUTICAL: 11.4 mCi F-18 FDG Uptake time 60 minutes. Glucose level 107 mg/dl. Blood pool SUV: 2.3 PET imaging was acquired from the base of the skull to the mid thighs. Non-contrast enhanced computed tomography was obtained for attenuation correction and anatomic  localization. COMPARISON: Neck CT 05/16/2024 and CT abdomen/pelvis 04/05/2022. CLINICAL HISTORY: Initial staging for squamous cell carcinoma, nasopharyngeal carcinoma, right neck swelling. FINDINGS: HEAD AND NECK: The right nasopharyngeal mass has maximal SUV of 14.5. Hypermetabolic right retropharyngeal, bilateral level IIa, bilateral level IIb, bilateral level IIIa, bilateral level IIIb, and right level IV adenopathy observed. Some of these lymph nodes, including the right retropharyngeal node, demonstrate central necrosis. An index right level IIa lymph node measuring 3.4 cm in short axis on image 26 of series 6 has a maximum SUV of 16.1. Chronic left maxillary sinusitis. CHEST: No metabolically active pulmonary nodules. No metabolically active lymphadenopathy. ABDOMEN AND PELVIS: No metabolically active intraperitoneal mass. No metabolically active lymphadenopathy. Physiologic activity within the gastrointestinal and genitourinary systems. BONES AND SOFT TISSUE: Low-grade diffuse accentuation of osseous metabolic activity without focal lesion observed. This can sometimes be seen with granulocyte stimulation. IMPRESSION: 1. Hypermetabolic right nasopharyngeal mass (SUV max 14.5). 2.  Extensive hypermetabolic cervical nodal metastases including right retropharyngeal and bilateral levels IIIII, some with central necrosis; index right level IIA node 3.4 cm (SUV max 16.1). Small but faintly hypermetabolic right level IV lymph node. 3. Chronic left maxillary sinusitis. 4. Low-grade diffuse osseous metabolic accentuation without focal lesion, which can be seen with granulocyte stimulation. Electronically signed by: Ryan Salvage MD 05/26/2024 12:05 PM EDT RP Workstation: [id number]   IR LYMPH NODE CORE BIOPSY Result Date: 05/17/2024 INDICATION: 28 year old female with pathologic right submandibular lymphadenopathy. She presents for ultrasound-guided core biopsy. EXAM: ULTRASOUND LYMPH NODE BIOPSY MEDICATIONS:  None. ANESTHESIA/SEDATION: Moderate (conscious) sedation was employed during this procedure. A total of Versed  1.5 mg and Fentanyl  75 mcg was administered intravenously. Moderate Sedation Time: 13 minutes. The patient's level of consciousness and vital signs were monitored continuously by radiology nursing throughout the procedure under my direct supervision. FLUOROSCOPY TIME:  None. COMPLICATIONS: None immediate. PROCEDURE: Informed written consent was obtained from the patient after a thorough discussion of the procedural risks, benefits and alternatives. All questions were addressed. Maximal Sterile Barrier Technique was utilized including caps, mask, sterile gowns, sterile gloves, sterile drape, hand hygiene and skin antiseptic. A timeout was performed prior to the initiation of the procedure. The right submandibular space was interrogated with ultrasound. A large hypoechoic lymph node is visualized. A suitable skin entry site was selected and marked. Local anesthesia was attained by infiltration with 1% lidocaine . A small dermatotomy was made. Under real-time ultrasound guidance, a numerous 18 gauge core biopsies were obtained. The biopsy specimens were fragmented and miniscule due to the necrotic nature of the lymph node. At least 10 biopsy passes were obtained. The biopsy specimens were divided between formalin and a saline moistened Telfa pad and then delivered to pathology for further analysis. Post biopsy imaging demonstrates no evidence of immediate complication. IMPRESSION: Successful ultrasound-guided core biopsy of right submandibular lymph node. Electronically Signed   By: Wilkie Lent M.D.   On: 05/17/2024 16:02   CT Soft Tissue Neck W Contrast Result Date: 05/16/2024 EXAM: CT NECK WITH CONTRAST 05/16/2024 09:34:18 AM TECHNIQUE: CT of the neck was performed with the administration of intravenous contrast. Multiplanar reformatted images are provided for review. Automated exposure control,  iterative reconstruction, and/or weight based adjustment of the mA/kV was utilized to reduce the radiation dose to as low as reasonably achievable. COMPARISON: CT of the neck dated 04/19/2024. CLINICAL HISTORY: Soft tissue infection suspected, neck, xray done. Pt to ED via POV from home. Pt ambulatory to triage. Pt reports lump on right side of neck that started a few days ago. Pt reports seen on 8/19 for a lump on the left side that went away. Pt reports pain is worse this time. Pt was admitted on 8/19 for cervical lymphadenitis. FINDINGS: AERODIGESTIVE TRACT: No discrete mass. No edema. An area of nodular thickening in the right nasopharynx seen on image 18 of series 2 appears to have mildly worsened in the interim from approximately 2.4 x 1.9 cm to approximately 2.7 x 2.2 cm. SALIVARY GLANDS: The parotid and submandibular glands are unremarkable. THYROID : Unremarkable. LYMPH NODES: There has been a mixed response to interval therapy. Large suppurative left-sided level 2 and 3 lymph nodes have significantly decreased in size in the interim and there has been interval resolution of surrounding inflammatory stranding. A left level 2 node which previously measured 3.4 x 3.3 x 3.9 cm now measures approximately 2.9 x 2.4 x 2.8 cm. A left level 3 node which previously measured 3.5 x  2.9 x 4.0 cm now measures approximately 2.8 x 2.3 x 3.2 cm. However, a primarily solid right cervical node which previously measured approximately 3.6 x 3.3 x 5.8 cm now measures approximately 4.0 x 3.9 x 6.5 cm. A right retropharyngeal node which was previously heterogeneous in attenuation is now diffusely cystic. Had previously measured approximately 2.4 x 2.1 x 3.7 cm and now measures approximately 2.6 x 2.3 x 4.4 cm. There are several additional mildly enlarged cervical lymph nodes which have not changed significantly in the interim. SOFT TISSUES: No mass or fluid collection. BRAIN, ORBITS, SINUSES AND MASTOIDS: No acute abnormality.  LUNGS AND MEDIASTINUM: No acute abnormality. BONES: No focal bone abnormality. IMPRESSION: 1. Mixed response to interval therapy for cervical lymphadenitis. 2. Significant decrease in size of large suppurative left-sided level 2 and 3 lymph nodes with interval resolution of surrounding inflammatory stranding. 3. Increase in size of a primarily solid right cervical node and a right retropharyngeal node, now diffusely cystic. 4. Mild worsening of nodular thickening in the right nasopharynx. An atypical organism or underlying neoplasm cannot be definitively excluded. Continued follow-up to ensure resolution is strongly advised. Electronically signed by: Evalene Coho MD 05/16/2024 10:21 AM EDT RP Workstation: [id number]    Microbiology: Results for orders placed or performed during the hospital encounter of 05/16/24  Aerobic/Anaerobic Culture w Gram Stain (surgical/deep wound)     Status: None   Collection Time: 05/17/24  3:55 PM   Specimen: Tissue  Result Value Ref Range Status   Specimen Description   Final    TISSUE Performed at [facility], [address], [place], [place] [address]    Special Requests   Final    RT LYMPH NODE Performed at [facility], [address] [place], [place] [address]    Gram Stain NO WBC SEEN NO ORGANISMS SEEN   Final   Culture   Final    No growth aerobically or anaerobically. Performed at [facility], [address] [place], [place] [address]    Report Status 05/22/2024 FINAL  Final    Labs: CBC: No results for input(s): WBC, NEUTROABS, HGB, HCT, MCV, PLT in the last 168 hours.  Basic Metabolic Panel: No results for input(s): NA, K, CL, CO2, GLUCOSE, BUN, CREATININE, CALCIUM, MG, PHOS in the last 168 hours.  Liver Function Tests: No results for input(s): AST, ALT, ALKPHOS, BILITOT, PROT, ALBUMIN in the last 168 hours.  CBG: Recent Labs  Lab 05/26/24 1011  GLUCAP 107*     Discharge time spent: greater than 30 minutes.  Signed: Burnard DELENA Cunning, DO [facility] 05/31/2024

## 2024-05-20 LAB — QUANTIFERON-TB GOLD PLUS (RQFGPL)
QuantiFERON Mitogen Value: 0.07 [IU]/mL
QuantiFERON Nil Value: 0.02 [IU]/mL
QuantiFERON TB1 Ag Value: 0.01 [IU]/mL
QuantiFERON TB2 Ag Value: 0.02 [IU]/mL

## 2024-05-20 LAB — FUNGAL ORGANISM REFLEX

## 2024-05-20 LAB — FUNGUS CULTURE WITH STAIN

## 2024-05-20 LAB — QUANTIFERON-TB GOLD PLUS: QuantiFERON-TB Gold Plus: UNDETERMINED — AB

## 2024-05-20 LAB — FUNGUS CULTURE RESULT

## 2024-05-22 LAB — AEROBIC/ANAEROBIC CULTURE W GRAM STAIN (SURGICAL/DEEP WOUND)
Culture: NO GROWTH
Gram Stain: NONE SEEN

## 2024-05-23 LAB — CYTOMEGALOVIRUS DNA, QUANTITATIVE REAL-TIME PCR, PLASMA
CMV DNA Quant: NEGATIVE [IU]/mL
Log10 CMV Qn DNA Pl: UNDETERMINED {Log_IU}/mL

## 2024-05-23 LAB — BARTONELLA ANTIBODY PANEL
B Quintana IgM: NEGATIVE {titer}
B henselae IgG: NEGATIVE {titer}
B henselae IgM: NEGATIVE {titer}
B quintana IgG: NEGATIVE {titer}

## 2024-05-23 LAB — EPSTEIN BARR VRS(EBV DNA BY PCR)
EBV DNA QN by PCR: 1510 [IU]/mL
log10 EBV DNA Qn PCR: 3.179 {Log_IU}/mL

## 2024-05-23 LAB — CMV ANTIBODY, IGG (EIA): CMV Ab - IgG: <0.6 U/mL (ref 0.00–0.59)

## 2024-05-23 SURGERY — EXCISION, MASS, NECK
Anesthesia: General | Laterality: Right

## 2024-05-26 ENCOUNTER — Ambulatory Visit
Admission: RE | Admit: 2024-05-26 | Discharge: 2024-05-26 | Disposition: A | Discharge status: Home / Self Care | Source: Ambulatory Visit | Attending: Oncology | Admitting: Oncology

## 2024-05-26 DIAGNOSIS — C770 Secondary and unspecified malignant neoplasm of lymph nodes of head, face and neck: Secondary | ICD-10-CM | POA: Diagnosis not present

## 2024-05-26 DIAGNOSIS — C119 Malignant neoplasm of nasopharynx, unspecified: Secondary | ICD-10-CM | POA: Insufficient documentation

## 2024-05-26 DIAGNOSIS — J320 Chronic maxillary sinusitis: Secondary | ICD-10-CM | POA: Diagnosis not present

## 2024-05-26 LAB — GLUCOSE, CAPILLARY: Glucose-Capillary: 107 mg/dL — ABNORMAL HIGH (ref 70–99)

## 2024-05-26 MED ORDER — FLUDEOXYGLUCOSE F - 18 (FDG) INJECTION
11.2000 | Freq: Once | INTRAVENOUS | Status: AC | PRN
  Administered 2024-05-26: 11.4 via INTRAVENOUS

## 2024-05-30 ENCOUNTER — Inpatient Hospital Stay: Admitting: Oncology

## 2024-05-30 ENCOUNTER — Ambulatory Visit
Admission: RE | Admit: 2024-05-30 | Discharge: 2024-05-30 | Disposition: A | Discharge status: Home / Self Care | Source: Ambulatory Visit | Attending: Radiation Oncology | Admitting: Radiation Oncology

## 2024-05-30 ENCOUNTER — Encounter: Payer: Self-pay | Admitting: Oncology

## 2024-05-30 VITALS — BP 132/90 | HR 90 | Temp 97.8°F | Resp 20 | Ht 62.0 in | Wt 222.0 lb

## 2024-05-30 DIAGNOSIS — Z923 Personal history of irradiation: Secondary | ICD-10-CM | POA: Diagnosis not present

## 2024-05-30 DIAGNOSIS — J45909 Unspecified asthma, uncomplicated: Secondary | ICD-10-CM | POA: Diagnosis not present

## 2024-05-30 DIAGNOSIS — C01 Malignant neoplasm of base of tongue: Secondary | ICD-10-CM | POA: Insufficient documentation

## 2024-05-30 DIAGNOSIS — Z9221 Personal history of antineoplastic chemotherapy: Secondary | ICD-10-CM | POA: Diagnosis not present

## 2024-05-30 DIAGNOSIS — C119 Malignant neoplasm of nasopharynx, unspecified: Secondary | ICD-10-CM | POA: Insufficient documentation

## 2024-05-30 DIAGNOSIS — C770 Secondary and unspecified malignant neoplasm of lymph nodes of head, face and neck: Secondary | ICD-10-CM | POA: Diagnosis not present

## 2024-05-30 DIAGNOSIS — Z87891 Personal history of nicotine dependence: Secondary | ICD-10-CM | POA: Insufficient documentation

## 2024-05-30 DIAGNOSIS — I10 Essential (primary) hypertension: Secondary | ICD-10-CM | POA: Insufficient documentation

## 2024-05-30 DIAGNOSIS — K219 Gastro-esophageal reflux disease without esophagitis: Secondary | ICD-10-CM | POA: Diagnosis not present

## 2024-05-30 DIAGNOSIS — Z79899 Other long term (current) drug therapy: Secondary | ICD-10-CM | POA: Insufficient documentation

## 2024-05-30 DIAGNOSIS — F17290 Nicotine dependence, other tobacco product, uncomplicated: Secondary | ICD-10-CM | POA: Insufficient documentation

## 2024-05-30 MED ORDER — ONDANSETRON HCL 8 MG PO TABS: 8.0000 mg | ORAL_TABLET | Freq: Three times a day (TID) | ORAL | 1 refills | Status: DC | PRN

## 2024-05-30 MED ORDER — PROCHLORPERAZINE MALEATE 10 MG PO TABS: 10.0000 mg | ORAL_TABLET | Freq: Four times a day (QID) | ORAL | 1 refills | Status: DC | PRN

## 2024-05-30 NOTE — Progress Notes (Unsigned)
 [facility]  [phone] [phone]  ID: Brandi [name] OB: 1995/10/25  MR#: [id number]  RDW#:[id number]  Patient Care Team: [name], MD as PCP - General (Internal Medicine) [name], MD as Consulting Physician (Radiation Oncology)  CHIEF COMPLAINT: Stage III EBV positive nasopharyngeal carcinoma.  INTERVAL HISTORY: Patient returns to clinic today for further evaluation, hospital follow-up, and treatment plan.  She currently feels well and is asymptomatic.  She no longer complains of any neck pain.  She denies dysphagia.  She has no neurologic complaints.  She denies any recent fevers.  She has a good appetite and denies weight loss.  She has no chest pain, shortness of breath, cough, or hemoptysis.  She denies any nausea, vomiting, constipation, or diarrhea.  She has no urinary complaints.  Patient offers no further specific complaints today.  REVIEW OF SYSTEMS:   Review of Systems  Constitutional: Negative.  Negative for chills, fever and malaise/fatigue.  HENT:  Negative for sore throat.   Respiratory: Negative.  Negative for cough, hemoptysis and shortness of breath.   Cardiovascular: Negative.  Negative for chest pain and leg swelling.  Gastrointestinal: Negative.  Negative for abdominal pain.  Genitourinary: Negative.  Negative for dysuria.  Musculoskeletal: Negative.  Negative for back pain.  Skin: Negative.  Negative for rash.  Neurological: Negative.  Negative for dizziness, focal weakness, weakness and headaches.  Psychiatric/Behavioral: Negative.  The patient is not nervous/anxious.     As per HPI. Otherwise, a complete review of systems is negative.  PAST MEDICAL HISTORY: Past Medical History:  Diagnosis Date   Asthma    GERD (gastroesophageal reflux disease)    HTN (hypertension)     PAST SURGICAL HISTORY: Past Surgical History:  Procedure Laterality Date   dental procedure     IR US  LIVER BIOPSY  05/17/2024     FAMILY HISTORY: Family History  Problem Relation Age of Onset   Hypertension Maternal Grandmother    Diabetes Maternal Grandmother     ADVANCED DIRECTIVES (Y/N):  N  HEALTH MAINTENANCE: Social History   Tobacco Use   Smoking status: Former    Types: E-cigarettes   Smokeless tobacco: Never  Vaping Use   Vaping status: Former  Substance Use Topics   Alcohol use: Not Currently    Comment: social   Drug use: Yes    Types: Marijuana     Colonoscopy:  PAP:  Bone density:  Lipid panel:  Allergies  Allergen Reactions   Shellfish Allergy Anaphylaxis   Tomato Other (See Comments)    Flares up exema    Current Outpatient Medications  Medication Sig Dispense Refill   acetaminophen  (TYLENOL ) 325 MG tablet Take 2 tablets (650 mg total) by mouth every 6 (six) hours as needed for mild pain (pain score 1-3) or fever (or Fever >/= 101).     oxyCODONE -acetaminophen  (PERCOCET/ROXICET) 5-325 MG tablet Take by mouth.     pantoprazole  (PROTONIX ) 40 MG tablet Take 1 tablet (40 mg total) by mouth daily. 30 tablet 11   promethazine  (PHENERGAN ) 12.5 MG tablet Take 1 tablet (12.5 mg total) by mouth every 6 (six) hours as needed for up to 5 days for nausea or vomiting. 20 tablet 0   senna-docusate (SENOKOT-S) 8.6-50 MG tablet Take 1 tablet by mouth 2 (two) times daily. 15 tablet 0   SLYND  4 MG TABS Take 1 tablet by mouth daily.     ondansetron  (ZOFRAN ) 8 MG tablet Take 1 tablet (8 mg total) by mouth every 8 (  eight) hours as needed for nausea or vomiting. Start on the third day after cisplatin. 60 tablet 1   oxyCODONE  (OXY IR/ROXICODONE ) 5 MG immediate release tablet Take 1 tablet (5 mg total) by mouth every 6 (six) hours as needed for moderate pain (pain score 4-6) or severe pain (pain score 7-10). (Patient not taking: Reported on 05/30/2024) 20 tablet 0   predniSONE  (STERAPRED UNI-PAK 21 TAB) 10 MG (21) TBPK tablet Take as directed over 6 days. Follow the dosing schedule on the blister pack.  (Patient not taking: Reported on 05/30/2024) 1 each 0   prochlorperazine (COMPAZINE) 10 MG tablet Take 1 tablet (10 mg total) by mouth every 6 (six) hours as needed (Nausea or vomiting). 60 tablet 1   No current facility-administered medications for this visit.    OBJECTIVE: Vitals:   05/30/24 1024  BP: (!) 132/90  Pulse: 90  Resp: 20  Temp: 97.8 F (36.6 C)  SpO2: 97%     Body mass index is 40.6 kg/m.    ECOG FS:0 - Asymptomatic  General: Well-developed, well-nourished, no acute distress. Eyes: Pink conjunctiva, anicteric sclera. HEENT: Normocephalic, moist mucous membranes. Lungs: No audible wheezing or coughing. Heart: Regular rate and rhythm. Abdomen: Soft, nontender, no obvious distention. Musculoskeletal: No edema, cyanosis, or clubbing. Neuro: Alert, answering all questions appropriately. Cranial nerves grossly intact. Skin: No rashes or petechiae noted. Psych: Normal affect. Lymphatics: No palpable cervical lymphadenopathy.   LAB RESULTS:  Lab Results  Component Value Date   NA 137 05/17/2024   K 3.7 05/17/2024   CL 104 05/17/2024   CO2 23 05/17/2024   GLUCOSE 131 (H) 05/17/2024   BUN 10 05/17/2024   CREATININE 0.64 05/17/2024   CALCIUM 9.1 05/17/2024   PROT 7.4 05/17/2024   ALBUMIN 3.5 05/17/2024   AST 16 05/17/2024   ALT 19 05/17/2024   ALKPHOS 66 05/17/2024   BILITOT 0.7 05/17/2024   GFRNONAA >60 05/17/2024   GFRAA >60 12/05/2019    Lab Results  Component Value Date   WBC 19.3 (H) 05/19/2024   NEUTROABS 19.3 (H) 04/20/2024   HGB 11.1 (L) 05/19/2024   HCT 32.9 (L) 05/19/2024   MCV 90.4 05/19/2024   PLT 360 05/19/2024     STUDIES: NM PET Image Initial (PI) Skull Base To Thigh (F-18 FDG) Result Date: 05/26/2024 EXAM: PET AND CT SKULL BASE TO MID THIGH 05/26/2024 11:48:20 AM TECHNIQUE: RADIOPHARMACEUTICAL: 11.4 mCi F-18 FDG Uptake time 60 minutes. Glucose level 107 mg/dl. Blood pool SUV: 2.3 PET imaging was acquired from the base of the skull to  the mid thighs. Non-contrast enhanced computed tomography was obtained for attenuation correction and anatomic localization. COMPARISON: Neck CT 05/16/2024 and CT abdomen/pelvis 04/05/2022. CLINICAL HISTORY: Initial staging for squamous cell carcinoma, nasopharyngeal carcinoma, right neck swelling. FINDINGS: HEAD AND NECK: The right nasopharyngeal mass has maximal SUV of 14.5. Hypermetabolic right retropharyngeal, bilateral level IIa, bilateral level IIb, bilateral level IIIa, bilateral level IIIb, and right level IV adenopathy observed. Some of these lymph nodes, including the right retropharyngeal node, demonstrate central necrosis. An index right level IIa lymph node measuring 3.4 cm in short axis on image 26 of series 6 has a maximum SUV of 16.1. Chronic left maxillary sinusitis. CHEST: No metabolically active pulmonary nodules. No metabolically active lymphadenopathy. ABDOMEN AND PELVIS: No metabolically active intraperitoneal mass. No metabolically active lymphadenopathy. Physiologic activity within the gastrointestinal and genitourinary systems. BONES AND SOFT TISSUE: Low-grade diffuse accentuation of osseous metabolic activity without focal lesion observed. This can sometimes  be seen with granulocyte stimulation. IMPRESSION: 1. Hypermetabolic right nasopharyngeal mass (SUV max 14.5). 2. Extensive hypermetabolic cervical nodal metastases including right retropharyngeal and bilateral levels IIIII, some with central necrosis; index right level IIA node 3.4 cm (SUV max 16.1). Small but faintly hypermetabolic right level IV lymph node. 3. Chronic left maxillary sinusitis. 4. Low-grade diffuse osseous metabolic accentuation without focal lesion, which can be seen with granulocyte stimulation. Electronically signed by: Ryan Salvage MD 05/26/2024 12:05 PM EDT RP Workstation: [id number]   IR LYMPH NODE CORE BIOPSY Result Date: 05/17/2024 INDICATION: 28 year old female with pathologic right submandibular  lymphadenopathy. She presents for ultrasound-guided core biopsy. EXAM: ULTRASOUND LYMPH NODE BIOPSY MEDICATIONS: None. ANESTHESIA/SEDATION: Moderate (conscious) sedation was employed during this procedure. A total of Versed  1.5 mg and Fentanyl  75 mcg was administered intravenously. Moderate Sedation Time: 13 minutes. The patient's level of consciousness and vital signs were monitored continuously by radiology nursing throughout the procedure under my direct supervision. FLUOROSCOPY TIME:  None. COMPLICATIONS: None immediate. PROCEDURE: Informed written consent was obtained from the patient after a thorough discussion of the procedural risks, benefits and alternatives. All questions were addressed. Maximal Sterile Barrier Technique was utilized including caps, mask, sterile gowns, sterile gloves, sterile drape, hand hygiene and skin antiseptic. A timeout was performed prior to the initiation of the procedure. The right submandibular space was interrogated with ultrasound. A large hypoechoic lymph node is visualized. A suitable skin entry site was selected and marked. Local anesthesia was attained by infiltration with 1% lidocaine . A small dermatotomy was made. Under real-time ultrasound guidance, a numerous 18 gauge core biopsies were obtained. The biopsy specimens were fragmented and miniscule due to the necrotic nature of the lymph node. At least 10 biopsy passes were obtained. The biopsy specimens were divided between formalin and a saline moistened Telfa pad and then delivered to pathology for further analysis. Post biopsy imaging demonstrates no evidence of immediate complication. IMPRESSION: Successful ultrasound-guided core biopsy of right submandibular lymph node. Electronically Signed   By: Wilkie Lent M.D.   On: 05/17/2024 16:02   CT Soft Tissue Neck W Contrast Result Date: 05/16/2024 EXAM: CT NECK WITH CONTRAST 05/16/2024 09:34:18 AM TECHNIQUE: CT of the neck was performed with the administration  of intravenous contrast. Multiplanar reformatted images are provided for review. Automated exposure control, iterative reconstruction, and/or weight based adjustment of the mA/kV was utilized to reduce the radiation dose to as low as reasonably achievable. COMPARISON: CT of the neck dated 04/19/2024. CLINICAL HISTORY: Soft tissue infection suspected, neck, xray done. Pt to ED via POV from home. Pt ambulatory to triage. Pt reports lump on right side of neck that started a few days ago. Pt reports seen on 8/19 for a lump on the left side that went away. Pt reports pain is worse this time. Pt was admitted on 8/19 for cervical lymphadenitis. FINDINGS: AERODIGESTIVE TRACT: No discrete mass. No edema. An area of nodular thickening in the right nasopharynx seen on image 18 of series 2 appears to have mildly worsened in the interim from approximately 2.4 x 1.9 cm to approximately 2.7 x 2.2 cm. SALIVARY GLANDS: The parotid and submandibular glands are unremarkable. THYROID : Unremarkable. LYMPH NODES: There has been a mixed response to interval therapy. Large suppurative left-sided level 2 and 3 lymph nodes have significantly decreased in size in the interim and there has been interval resolution of surrounding inflammatory stranding. A left level 2 node which previously measured 3.4 x 3.3 x 3.9 cm now measures approximately 2.9  x 2.4 x 2.8 cm. A left level 3 node which previously measured 3.5 x 2.9 x 4.0 cm now measures approximately 2.8 x 2.3 x 3.2 cm. However, a primarily solid right cervical node which previously measured approximately 3.6 x 3.3 x 5.8 cm now measures approximately 4.0 x 3.9 x 6.5 cm. A right retropharyngeal node which was previously heterogeneous in attenuation is now diffusely cystic. Had previously measured approximately 2.4 x 2.1 x 3.7 cm and now measures approximately 2.6 x 2.3 x 4.4 cm. There are several additional mildly enlarged cervical lymph nodes which have not changed significantly in the  interim. SOFT TISSUES: No mass or fluid collection. BRAIN, ORBITS, SINUSES AND MASTOIDS: No acute abnormality. LUNGS AND MEDIASTINUM: No acute abnormality. BONES: No focal bone abnormality. IMPRESSION: 1. Mixed response to interval therapy for cervical lymphadenitis. 2. Significant decrease in size of large suppurative left-sided level 2 and 3 lymph nodes with interval resolution of surrounding inflammatory stranding. 3. Increase in size of a primarily solid right cervical node and a right retropharyngeal node, now diffusely cystic. 4. Mild worsening of nodular thickening in the right nasopharynx. An atypical organism or underlying neoplasm cannot be definitively excluded. Continued follow-up to ensure resolution is strongly advised. Electronically signed by: Evalene Coho MD 05/16/2024 10:21 AM EDT RP Workstation: [id number]    ASSESSMENT: Stage III EBV positive nasopharyngeal carcinoma.  PLAN:    Stage III EBV positive nasopharyngeal carcinoma: Scan results from May 26, 2024 reviewed independently and are firming stage of disease.  Patient will benefit from concurrent chemotherapy using weekly cisplatin along with daily XRT.  She had consultation with radiation oncology today.  We discussed the possibility of port placement, but patient declined at this time.  She will return to clinic in 1 to 2 weeks to initiate cycle 1.  I spent a total of 30 minutes reviewing chart data, face-to-face evaluation with the patient, counseling and coordination of care as detailed above.   Patient expressed understanding and was in agreement with this plan. She also understands that She can call clinic at any time with any questions, concerns, or complaints.    Cancer Staging  Nasopharyngeal carcinoma [facility]) Staging form: Pharynx - Nasopharynx, AJCC V9 - Clinical stage from 05/30/2024: Stage III (cTX, cN3, cM0) - Signed by [name], MD on 05/30/2024 Stage prefix: Initial diagnosis Method of lymph  node assessment: Clinical   Evalene PARAS Jacobo, MD   05/31/2024 6:45 AM

## 2024-05-30 NOTE — Progress Notes (Unsigned)
 Patient says that she is still having some pain from the site they aspirated, she rates her pain at about a 6 right now. She is definitely not wanting any kind of surgery.

## 2024-05-30 NOTE — Progress Notes (Signed)
 START OFF PATHWAY REGIMEN - Head and Neck   [id number]:Cisplatin 40 mg/m2 IV D1 q7 Days + RT:   A cycle is every 7 days:     Cisplatin   **Always confirm dose/schedule in your pharmacy ordering system**  Patient Characteristics: Nasopharyngeal, Definitive Therapy, M0 (Clinical Staging), Stage IB - III Disease Classification: Nasopharyngeal Therapeutic Status: Definitive Therapy, M0 (Clinical Staging) Check here if patient was staged using an edition other than AJCC Staging 9th Edition: false AJCC T Category: cTX AJCC N Category: cN3 AJCC M Category: cM0 AJCC 9 Stage Grouping: III Intent of Therapy: Curative Intent, Discussed with Patient

## 2024-05-30 NOTE — Consult Note (Signed)
 NEW PATIENT EVALUATION  Name: Brandi [name]  MRN: [id number]  Date:   05/30/2024     DOB: Jan 07, 1996   This 28 y.o. 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  REFERRING PHYSICIAN: [name], MD  CHIEF COMPLAINT:  Chief Complaint  Patient presents with   Nasopharyngeal    DIAGNOSIS: The encounter diagnosis was Nasopharyngeal carcinoma (HCC).   PREVIOUS INVESTIGATIONS:  PET scan and CT scans reviewed Pathology report reviewed Clinical notes reviewed  HPI: Patient is a 28 year old female who presented to the emergency room with bilateral adenopathy in her head and neck.  She was having pain and swelling and was initially treated with a for acute lymphadenitis.  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  PET scan was performed showing hypermetabolic right nasopharyngeal mass with extensive hypermetabolic cervical nodal adenopathy including right retropharyngeal and bilateral level 2 and 3.  She is currently on steroid therapy was having some dysphagia.  She has been seen by medical oncology with plan to proceed with concurrent chemoradiation.  She is seen today for radiation oncology opinion.  PLANNED TREATMENT REGIMEN: Concurrent chemoradiation  PAST MEDICAL HISTORY:  has a past medical history of Asthma, GERD (gastroesophageal reflux disease), and HTN (hypertension).    PAST SURGICAL HISTORY:  Past Surgical History:  Procedure Laterality Date   dental procedure     IR US  LIVER BIOPSY  05/17/2024    FAMILY HISTORY: family history includes Diabetes in her maternal grandmother; Hypertension in her maternal grandmother.  SOCIAL HISTORY:  reports that she has quit smoking. Her smoking use included e-cigarettes. She has never used smokeless tobacco. She reports that she does not currently use alcohol. She  reports current drug use. Drug: Marijuana.  ALLERGIES: Shellfish allergy and Tomato  MEDICATIONS:  Current Outpatient Medications  Medication Sig Dispense Refill   acetaminophen  (TYLENOL ) 325 MG tablet Take 2 tablets (650 mg total) by mouth every 6 (six) hours as needed for mild pain (pain score 1-3) or fever (or Fever >/= 101).     oxyCODONE  (OXY IR/ROXICODONE ) 5 MG immediate release tablet Take 1 tablet (5 mg total) by mouth every 6 (six) hours as needed for moderate pain (pain score 4-6) or severe pain (pain score 7-10). (Patient not taking: Reported on 05/30/2024) 20 tablet 0   oxyCODONE -acetaminophen  (PERCOCET/ROXICET) 5-325 MG tablet Take by mouth.     pantoprazole  (PROTONIX ) 40 MG tablet Take 1 tablet (40 mg total) by mouth daily. 30 tablet 11   predniSONE  (STERAPRED UNI-PAK 21 TAB) 10 MG (21) TBPK tablet Take as directed over 6 days. Follow the dosing schedule on the blister pack. (Patient not taking: Reported on 05/30/2024) 1 each 0   promethazine  (PHENERGAN ) 12.5 MG tablet Take 1 tablet (12.5 mg total) by mouth every 6 (six) hours as needed for up to 5 days for nausea or vomiting. 20 tablet 0   senna-docusate (SENOKOT-S) 8.6-50 MG tablet Take 1 tablet by mouth 2 (two) times daily. 15 tablet 0   SLYND  4 MG TABS Take 1 tablet by mouth daily.     No current facility-administered medications for this encounter.    ECOG PERFORMANCE STATUS:  1 - Symptomatic but completely ambulatory  REVIEW OF SYSTEMS: Patient denies any weight loss, fatigue, weakness, fever, chills or night sweats. Patient denies any loss of vision, blurred vision. Patient denies any ringing  of  the ears or hearing loss. No irregular heartbeat. Patient denies heart murmur or history of fainting. Patient denies any chest pain or pain radiating to her upper extremities. Patient denies any shortness of breath, difficulty breathing at night, cough or hemoptysis. Patient denies any swelling in the lower legs. Patient denies any  nausea vomiting, vomiting of blood, or coffee ground material in the vomitus. Patient denies any stomach pain. Patient states has had normal bowel movements no significant constipation or diarrhea. Patient denies any dysuria, hematuria or significant nocturia. Patient denies any problems walking, swelling in the joints or loss of balance. Patient denies any skin changes, loss of hair or loss of weight. Patient denies any excessive worrying or anxiety or significant depression. Patient denies any problems with insomnia. Patient denies excessive thirst, polyuria, polydipsia. Patient denies any swollen glands, patient denies easy bruising or easy bleeding. Patient denies any recent infections, allergies or URI. Patient s visual fields have not changed significantly in recent time.   PHYSICAL EXAM: LMP 05/17/2024 (Exact Date)  Slightly obese female in NAD.  Oral cavity is clear no oral mucosal lesions are identified.  She has bilateral significant adenopathy in the cervical chain.  Well-developed well-nourished patient in NAD. HEENT reveals PERLA, EOMI, discs not visualized.  Oral cavity is clear. No oral mucosal lesions are identified. Neck is clear without evidence of cervical or supraclavicular adenopathy. Lungs are clear to A&P. Cardiac examination is essentially unremarkable with regular rate and rhythm without murmur rub or thrill. Abdomen is benign with no organomegaly or masses noted. Motor sensory and DTR levels are equal and symmetric in the upper and lower extremities. Cranial nerves II through XII are grossly intact. Proprioception is intact. No peripheral adenopathy or edema is identified. No motor or sensory levels are noted. Crude visual fields are within normal range.  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  Will treat up to 70 Gray to her primary base of tongue lesion as well as her neck nodes.  With treatment of her neck nodes to 53 Gray using IMRT treatment planning and delivery.  I would use IMRT to spare critical structures such as her spinal cord salivary glands oral mucosa.  Risks and benefits of treatment occluding dysphagia oral mucositis skin reaction fatigue alteration of blood counts potential xerostomia loss of taste all were reviewed with the patient.  She comprehends my treatment plan well.  I personally set up and ordered CT simulation for tomorrow.  Will coordinate with medical oncology her initiation of her chemotherapy.  There will be extra effort by both professional staff as well as technical staff to coordinate and manage concurrent chemoradiation and ensuing side effects during her treatments.  Patient comprehends her recommendations well.  I would like to take this opportunity to thank you for allowing me to participate in the care of your patient.SABRA Marcey Penton, MD

## 2024-05-31 ENCOUNTER — Other Ambulatory Visit: Payer: Self-pay

## 2024-05-31 ENCOUNTER — Encounter: Payer: Self-pay | Admitting: Oncology

## 2024-05-31 ENCOUNTER — Ambulatory Visit
Admission: RE | Admit: 2024-05-31 | Discharge: 2024-05-31 | Disposition: A | Discharge status: Home / Self Care | Source: Ambulatory Visit | Attending: Radiation Oncology | Admitting: Radiation Oncology

## 2024-05-31 DIAGNOSIS — C770 Secondary and unspecified malignant neoplasm of lymph nodes of head, face and neck: Secondary | ICD-10-CM | POA: Diagnosis not present

## 2024-05-31 DIAGNOSIS — D72829 Elevated white blood cell count, unspecified: Secondary | ICD-10-CM | POA: Insufficient documentation

## 2024-05-31 DIAGNOSIS — E871 Hypo-osmolality and hyponatremia: Secondary | ICD-10-CM | POA: Diagnosis not present

## 2024-05-31 DIAGNOSIS — C01 Malignant neoplasm of base of tongue: Secondary | ICD-10-CM | POA: Diagnosis not present

## 2024-05-31 DIAGNOSIS — R110 Nausea: Secondary | ICD-10-CM | POA: Diagnosis not present

## 2024-05-31 DIAGNOSIS — Z923 Personal history of irradiation: Secondary | ICD-10-CM | POA: Diagnosis not present

## 2024-05-31 DIAGNOSIS — D75839 Thrombocytosis, unspecified: Secondary | ICD-10-CM | POA: Insufficient documentation

## 2024-05-31 DIAGNOSIS — R1310 Dysphagia, unspecified: Secondary | ICD-10-CM | POA: Diagnosis not present

## 2024-05-31 DIAGNOSIS — E876 Hypokalemia: Secondary | ICD-10-CM | POA: Diagnosis not present

## 2024-05-31 DIAGNOSIS — Z87891 Personal history of nicotine dependence: Secondary | ICD-10-CM | POA: Insufficient documentation

## 2024-05-31 DIAGNOSIS — J320 Chronic maxillary sinusitis: Secondary | ICD-10-CM | POA: Insufficient documentation

## 2024-05-31 DIAGNOSIS — Z9221 Personal history of antineoplastic chemotherapy: Secondary | ICD-10-CM | POA: Insufficient documentation

## 2024-05-31 DIAGNOSIS — Z510 Encounter for antineoplastic radiation therapy: Secondary | ICD-10-CM | POA: Diagnosis present

## 2024-05-31 DIAGNOSIS — R630 Anorexia: Secondary | ICD-10-CM | POA: Diagnosis not present

## 2024-05-31 DIAGNOSIS — C119 Malignant neoplasm of nasopharynx, unspecified: Secondary | ICD-10-CM | POA: Insufficient documentation

## 2024-05-31 DIAGNOSIS — F17290 Nicotine dependence, other tobacco product, uncomplicated: Secondary | ICD-10-CM | POA: Insufficient documentation

## 2024-05-31 DIAGNOSIS — Z5111 Encounter for antineoplastic chemotherapy: Secondary | ICD-10-CM | POA: Insufficient documentation

## 2024-05-31 DIAGNOSIS — D649 Anemia, unspecified: Secondary | ICD-10-CM | POA: Diagnosis not present

## 2024-05-31 DIAGNOSIS — Z79899 Other long term (current) drug therapy: Secondary | ICD-10-CM | POA: Insufficient documentation

## 2024-05-31 DIAGNOSIS — M549 Dorsalgia, unspecified: Secondary | ICD-10-CM | POA: Insufficient documentation

## 2024-05-31 DIAGNOSIS — I10 Essential (primary) hypertension: Secondary | ICD-10-CM | POA: Diagnosis not present

## 2024-05-31 DIAGNOSIS — K219 Gastro-esophageal reflux disease without esophagitis: Secondary | ICD-10-CM | POA: Diagnosis not present

## 2024-05-31 DIAGNOSIS — K5900 Constipation, unspecified: Secondary | ICD-10-CM | POA: Diagnosis not present

## 2024-05-31 DIAGNOSIS — J45909 Unspecified asthma, uncomplicated: Secondary | ICD-10-CM | POA: Diagnosis not present

## 2024-06-01 ENCOUNTER — Encounter: Payer: Self-pay | Admitting: Oncology

## 2024-06-01 ENCOUNTER — Other Ambulatory Visit: Payer: Self-pay | Admitting: Oncology

## 2024-06-01 DIAGNOSIS — C119 Malignant neoplasm of nasopharynx, unspecified: Secondary | ICD-10-CM

## 2024-06-05 NOTE — Progress Notes (Signed)
 Pharmacist Chemotherapy Monitoring - Initial Assessment    Anticipated start date: 06/14/24   The following has been reviewed per standard work regarding the patient's treatment regimen: The patient's diagnosis, treatment plan and drug doses, and organ/hematologic function Lab orders and baseline tests specific to treatment regimen  The treatment plan start date, drug sequencing, and pre-medications Prior authorization status  Patient's documented medication list, including drug-drug interaction screen and prescriptions for anti-emetics and supportive care specific to the treatment regimen The drug concentrations, fluid compatibility, administration routes, and timing of the medications to be used The patient's access for treatment and lifetime cumulative dose history, if applicable  The patient's medication allergies and previous infusion related reactions, if applicable  Stage III EBV positive nasopharyngeal carcinoma  concurrent chemotherapy using weekly cisplatin along with daily XRT.   Follow up needed:  N/A   Brandi [name], [facility], 06/05/2024  2:37 PM

## 2024-06-07 DIAGNOSIS — Z510 Encounter for antineoplastic radiation therapy: Secondary | ICD-10-CM | POA: Diagnosis not present

## 2024-06-12 ENCOUNTER — Ambulatory Visit
Admission: RE | Admit: 2024-06-12 | Discharge: 2024-06-12 | Disposition: A | Discharge status: Home / Self Care | Source: Ambulatory Visit | Attending: Radiation Oncology | Admitting: Radiation Oncology

## 2024-06-13 ENCOUNTER — Other Ambulatory Visit: Payer: Self-pay

## 2024-06-13 ENCOUNTER — Encounter: Payer: Self-pay | Admitting: Oncology

## 2024-06-13 ENCOUNTER — Ambulatory Visit
Admission: RE | Admit: 2024-06-13 | Discharge: 2024-06-13 | Disposition: A | Discharge status: Home / Self Care | Source: Ambulatory Visit | Attending: Radiation Oncology | Admitting: Radiation Oncology

## 2024-06-13 DIAGNOSIS — Z510 Encounter for antineoplastic radiation therapy: Secondary | ICD-10-CM | POA: Diagnosis not present

## 2024-06-13 LAB — RAD ONC ARIA SESSION SUMMARY
Course Elapsed Days: 0
Plan Fractions Treated to Date: 1
Plan Prescribed Dose Per Fraction: 2 Gy
Plan Total Fractions Prescribed: 35
Plan Total Prescribed Dose: 70 Gy
Reference Point Dosage Given to Date: 2 Gy
Reference Point Session Dosage Given: 2 Gy
Session Number: 1

## 2024-06-14 ENCOUNTER — Ambulatory Visit
Admission: RE | Admit: 2024-06-14 | Discharge: 2024-06-14 | Disposition: A | Source: Ambulatory Visit | Attending: Radiation Oncology | Admitting: Radiation Oncology

## 2024-06-14 ENCOUNTER — Encounter: Payer: Self-pay | Admitting: Oncology

## 2024-06-14 ENCOUNTER — Inpatient Hospital Stay

## 2024-06-14 ENCOUNTER — Inpatient Hospital Stay: Admitting: Licensed Clinical Social Worker

## 2024-06-14 ENCOUNTER — Other Ambulatory Visit: Payer: Self-pay

## 2024-06-14 ENCOUNTER — Telehealth: Payer: Self-pay

## 2024-06-14 ENCOUNTER — Other Ambulatory Visit: Payer: Self-pay | Admitting: *Deleted

## 2024-06-14 ENCOUNTER — Inpatient Hospital Stay (HOSPITAL_BASED_OUTPATIENT_CLINIC_OR_DEPARTMENT_OTHER): Admitting: Oncology

## 2024-06-14 ENCOUNTER — Inpatient Hospital Stay: Attending: Oncology

## 2024-06-14 VITALS — BP 130/91 | HR 77

## 2024-06-14 VITALS — BP 122/78 | HR 90 | Temp 98.7°F | Resp 18 | Ht 62.0 in | Wt 218.0 lb

## 2024-06-14 DIAGNOSIS — I10 Essential (primary) hypertension: Secondary | ICD-10-CM | POA: Insufficient documentation

## 2024-06-14 DIAGNOSIS — E871 Hypo-osmolality and hyponatremia: Secondary | ICD-10-CM | POA: Insufficient documentation

## 2024-06-14 DIAGNOSIS — J320 Chronic maxillary sinusitis: Secondary | ICD-10-CM | POA: Insufficient documentation

## 2024-06-14 DIAGNOSIS — R630 Anorexia: Secondary | ICD-10-CM | POA: Insufficient documentation

## 2024-06-14 DIAGNOSIS — R1310 Dysphagia, unspecified: Secondary | ICD-10-CM | POA: Insufficient documentation

## 2024-06-14 DIAGNOSIS — E876 Hypokalemia: Secondary | ICD-10-CM | POA: Insufficient documentation

## 2024-06-14 DIAGNOSIS — C119 Malignant neoplasm of nasopharynx, unspecified: Secondary | ICD-10-CM

## 2024-06-14 DIAGNOSIS — D75839 Thrombocytosis, unspecified: Secondary | ICD-10-CM | POA: Insufficient documentation

## 2024-06-14 DIAGNOSIS — Z923 Personal history of irradiation: Secondary | ICD-10-CM | POA: Insufficient documentation

## 2024-06-14 DIAGNOSIS — K219 Gastro-esophageal reflux disease without esophagitis: Secondary | ICD-10-CM | POA: Insufficient documentation

## 2024-06-14 DIAGNOSIS — R110 Nausea: Secondary | ICD-10-CM | POA: Insufficient documentation

## 2024-06-14 DIAGNOSIS — Z510 Encounter for antineoplastic radiation therapy: Secondary | ICD-10-CM | POA: Diagnosis not present

## 2024-06-14 DIAGNOSIS — D72829 Elevated white blood cell count, unspecified: Secondary | ICD-10-CM | POA: Insufficient documentation

## 2024-06-14 DIAGNOSIS — Z5111 Encounter for antineoplastic chemotherapy: Secondary | ICD-10-CM | POA: Insufficient documentation

## 2024-06-14 DIAGNOSIS — M549 Dorsalgia, unspecified: Secondary | ICD-10-CM | POA: Insufficient documentation

## 2024-06-14 DIAGNOSIS — Z79899 Other long term (current) drug therapy: Secondary | ICD-10-CM | POA: Insufficient documentation

## 2024-06-14 DIAGNOSIS — Z87891 Personal history of nicotine dependence: Secondary | ICD-10-CM | POA: Insufficient documentation

## 2024-06-14 DIAGNOSIS — D649 Anemia, unspecified: Secondary | ICD-10-CM | POA: Insufficient documentation

## 2024-06-14 DIAGNOSIS — K5900 Constipation, unspecified: Secondary | ICD-10-CM | POA: Insufficient documentation

## 2024-06-14 LAB — BASIC METABOLIC PANEL - CANCER CENTER ONLY
Anion gap: 8 (ref 5–15)
BUN: 6 mg/dL (ref 6–20)
CO2: 24 mmol/L (ref 22–32)
Calcium: 9 mg/dL (ref 8.9–10.3)
Chloride: 105 mmol/L (ref 98–111)
Creatinine: 0.81 mg/dL (ref 0.44–1.00)
GFR, Estimated: >60 mL/min (ref >60)
Glucose, Bld: 113 mg/dL — ABNORMAL HIGH (ref 70–99)
Potassium: 3.9 mmol/L (ref 3.5–5.1)
Sodium: 137 mmol/L (ref 135–145)

## 2024-06-14 LAB — RAD ONC ARIA SESSION SUMMARY
Course Elapsed Days: 1
Plan Fractions Treated to Date: 2
Plan Prescribed Dose Per Fraction: 2 Gy
Plan Total Fractions Prescribed: 35
Plan Total Prescribed Dose: 70 Gy
Reference Point Dosage Given to Date: 4 Gy
Reference Point Session Dosage Given: 2 Gy
Session Number: 2

## 2024-06-14 LAB — CBC WITH DIFFERENTIAL (CANCER CENTER ONLY)
Abs Immature Granulocytes: 0.07 K/uL (ref 0.00–0.07)
Basophils Absolute: 0 K/uL (ref 0.0–0.1)
Basophils Relative: 0 %
Eosinophils Absolute: 0.2 K/uL (ref 0.0–0.5)
Eosinophils Relative: 2 %
HCT: 30.8 % — ABNORMAL LOW (ref 36.0–46.0)
Hemoglobin: 10.2 g/dL — ABNORMAL LOW (ref 12.0–15.0)
Immature Granulocytes: 1 %
Lymphocytes Relative: 12 %
Lymphs Abs: 1.3 K/uL (ref 0.7–4.0)
MCH: 29.9 pg (ref 26.0–34.0)
MCHC: 33.1 g/dL (ref 30.0–36.0)
MCV: 90.3 fL (ref 80.0–100.0)
Monocytes Absolute: 0.6 K/uL (ref 0.1–1.0)
Monocytes Relative: 5 %
Neutro Abs: 8.7 K/uL — ABNORMAL HIGH (ref 1.7–7.7)
Neutrophils Relative %: 80 %
Platelet Count: 472 K/uL — ABNORMAL HIGH (ref 150–400)
RBC: 3.41 MIL/uL — ABNORMAL LOW (ref 3.87–5.11)
RDW: 13.2 % (ref 11.5–15.5)
WBC Count: 10.9 K/uL — ABNORMAL HIGH (ref 4.0–10.5)
nRBC: 0 % (ref 0.0–0.2)

## 2024-06-14 LAB — MAGNESIUM: Magnesium: 2.1 mg/dL (ref 1.7–2.4)

## 2024-06-14 LAB — PREGNANCY, URINE: Preg Test, Ur: NEGATIVE

## 2024-06-14 MED ORDER — ACETAMINOPHEN 325 MG PO TABS
650.0000 mg | ORAL_TABLET | Freq: Once | ORAL | Status: AC
  Administered 2024-06-14: 650 mg via ORAL
  Filled 2024-06-14: qty 2

## 2024-06-14 MED ORDER — MAGNESIUM SULFATE 2 GM/50ML IV SOLN
2.0000 g | Freq: Once | INTRAVENOUS | Status: AC
  Administered 2024-06-14: 2 g via INTRAVENOUS
  Filled 2024-06-14: qty 50

## 2024-06-14 MED ORDER — APREPITANT 130 MG/18ML IV EMUL
130.0000 mg | Freq: Once | INTRAVENOUS | Status: AC
  Administered 2024-06-14: 130 mg via INTRAVENOUS
  Filled 2024-06-14: qty 18

## 2024-06-14 MED ORDER — CYCLOBENZAPRINE HCL 10 MG PO TABS: 10.0000 mg | ORAL_TABLET | Freq: Three times a day (TID) | ORAL | 0 refills | Status: DC | PRN

## 2024-06-14 MED ORDER — SODIUM CHLORIDE 0.9 % IV SOLN
40.0000 mg/m2 | Freq: Once | INTRAVENOUS | Status: AC
  Administered 2024-06-14: 84 mg via INTRAVENOUS
  Filled 2024-06-14: qty 84

## 2024-06-14 MED ORDER — POTASSIUM CHLORIDE IN NACL 20-0.9 MEQ/L-% IV SOLN
Freq: Once | INTRAVENOUS | Status: AC
  Filled 2024-06-14: qty 1000

## 2024-06-14 MED ORDER — PALONOSETRON HCL INJECTION 0.25 MG/5ML
0.2500 mg | Freq: Once | INTRAVENOUS | Status: AC
  Administered 2024-06-14: 0.25 mg via INTRAVENOUS
  Filled 2024-06-14: qty 5

## 2024-06-14 MED ORDER — SODIUM CHLORIDE 0.9 % IV SOLN
INTRAVENOUS | Status: DC
  Filled 2024-06-14: qty 250

## 2024-06-14 MED ORDER — DEXAMETHASONE SOD PHOSPHATE PF 10 MG/ML IJ SOLN
10.0000 mg | Freq: Once | INTRAMUSCULAR | Status: AC
  Administered 2024-06-14: 10 mg via INTRAVENOUS

## 2024-06-14 NOTE — Telephone Encounter (Signed)
 Clinical Social Work was referred by medical provider for assessment of psychosocial needs.  CSW attempted to contact patient by phone.  Left a voicemail with contact information and request for a return call.

## 2024-06-14 NOTE — Progress Notes (Signed)
 Starting about 2-3 days ago her lower back has been hurting her, she also threw up for the last two day. Constipation is also a symptom she is having. She feels like she might need something stronger for the pain.

## 2024-06-14 NOTE — Patient Instructions (Signed)
 [facility] - A DEPT OF [facility]  Discharge Instructions: Thank you for choosing [facility] to provide your oncology and hematology care.  If you have a lab appointment with the Cancer Center, please go directly to the Cancer Center and check in at the registration area.  Wear comfortable clothing and clothing appropriate for easy access to any Portacath or PICC line.   We strive to give you quality time with your provider. You may need to reschedule your appointment if you arrive late (15 or more minutes).  Arriving late affects you and other patients whose appointments are after yours.  Also, if you miss three or more appointments without notifying the office, you may be dismissed from the clinic at the provider's discretion.      For prescription refill requests, have your pharmacy contact our office and allow 72 hours for refills to be completed.    Today you received the following chemotherapy and/or immunotherapy agents- cisplatin      To help prevent nausea and vomiting after your treatment, we encourage you to take your nausea medication as directed.  BELOW ARE SYMPTOMS THAT SHOULD BE REPORTED IMMEDIATELY: *FEVER GREATER THAN 100.4 F (38 C) OR HIGHER *CHILLS OR SWEATING *NAUSEA AND VOMITING THAT IS NOT CONTROLLED WITH YOUR NAUSEA MEDICATION *UNUSUAL SHORTNESS OF BREATH *UNUSUAL BRUISING OR BLEEDING *URINARY PROBLEMS (pain or burning when urinating, or frequent urination) *BOWEL PROBLEMS (unusual diarrhea, constipation, pain near the anus) TENDERNESS IN MOUTH AND THROAT WITH OR WITHOUT PRESENCE OF ULCERS (sore throat, sores in mouth, or a toothache) UNUSUAL RASH, SWELLING OR PAIN  UNUSUAL VAGINAL DISCHARGE OR ITCHING   Items with * indicate a potential emergency and should be followed up as soon as possible or go to the Emergency Department if any problems should occur.  Please show the CHEMOTHERAPY ALERT CARD or IMMUNOTHERAPY  ALERT CARD at check-in to the Emergency Department and triage nurse.  Should you have questions after your visit or need to cancel or reschedule your appointment, please contact [facility] - A DEPT OF [facility]  [phone] and follow the prompts.  Office hours are 8:00 a.m. to 4:30 p.m. Monday - Friday. Please note that voicemails left after 4:00 p.m. may not be returned until the following business day.  We are closed weekends and major holidays. You have access to a nurse at all times for urgent questions. Please call the main number to the clinic [phone] and follow the prompts.  For any non-urgent questions, you may also contact your provider using MyChart. We now offer e-Visits for anyone 9 and older to request care online for non-urgent symptoms. For details visit [email or web address].   Also download the MyChart app! Go to the app store, search "MyChart", open the app, select , and log in with your MyChart username and password.

## 2024-06-14 NOTE — Progress Notes (Signed)
 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  INTERVAL HISTORY: Patient returns to clinic today for further evaluation and initiation of cycle 1 of weekly cisplatin.  She is anxious, but otherwise feels well.  She has lower musculoskeletal back pain. She no longer complains of any neck pain.  She denies dysphagia.  She has no neurologic complaints.  She denies any recent fevers.  She has a good appetite and denies weight loss.  She has no chest pain, shortness of breath, cough, or hemoptysis.  She denies any nausea, vomiting, constipation, or diarrhea.  She has no urinary complaints.  Patient offers no further specific complaints today.  REVIEW OF SYSTEMS:   Review of Systems  Constitutional: Negative.  Negative for chills, fever and malaise/fatigue.  HENT:  Negative for sore throat.   Respiratory: Negative.  Negative for cough, hemoptysis and shortness of breath.   Cardiovascular: Negative.  Negative for chest pain and leg swelling.  Gastrointestinal: Negative.  Negative for abdominal pain.  Genitourinary: Negative.  Negative for dysuria.  Musculoskeletal:  Positive for back pain.  Skin: Negative.  Negative for rash.  Neurological: Negative.  Negative for dizziness, focal weakness, weakness and headaches.  Psychiatric/Behavioral: Negative.  The patient is not nervous/anxious.     As per HPI. Otherwise, a complete review of systems is negative.  PAST MEDICAL HISTORY: Past Medical History:  Diagnosis Date   Asthma    GERD (gastroesophageal reflux disease)    HTN (hypertension)     PAST SURGICAL HISTORY: Past Surgical History:  Procedure Laterality Date   dental procedure      IR US  LIVER BIOPSY  05/17/2024    FAMILY HISTORY: Family History  Problem Relation Age of Onset   Hypertension Maternal Grandmother    Diabetes Maternal Grandmother     ADVANCED DIRECTIVES (Y/N):  N  HEALTH MAINTENANCE: Social History   Tobacco Use   Smoking status: Former    Types: E-cigarettes   Smokeless tobacco: Never  Vaping Use   Vaping status: Former  Substance Use Topics   Alcohol use: Not Currently    Comment: social   Drug use: Yes    Types: Marijuana     Colonoscopy:  PAP:  Bone density:  Lipid panel:  Allergies  Allergen Reactions   Shellfish Allergy Anaphylaxis   Tomato Other (See Comments)    Flares up exema    Current Outpatient Medications  Medication Sig Dispense Refill   acetaminophen  (TYLENOL ) 325 MG tablet Take 2 tablets (650 mg total) by mouth every 6 (six) hours as needed for mild pain (pain score 1-3) or fever (or Fever >/= 101).     cyclobenzaprine  (FLEXERIL ) 10 MG tablet Take 1 tablet (10 mg total) by mouth 3 (three) times daily as needed for muscle spasms. 90 tablet 0   ondansetron  (ZOFRAN ) 8 MG tablet Take 1 tablet (8 mg total) by mouth every 8 (eight) hours as needed for nausea or vomiting. Start on the third day after cisplatin. 60 tablet 1   oxyCODONE -acetaminophen  (PERCOCET/ROXICET) 5-325 MG tablet Take by mouth.     pantoprazole  (PROTONIX ) 40 MG tablet Take 1 tablet (40 mg total) by mouth daily. 30 tablet 11   prochlorperazine (COMPAZINE) 10 MG tablet Take 1 tablet (10 mg total)  by mouth every 6 (six) hours as needed (Nausea or vomiting). 60 tablet 1   promethazine  (PHENERGAN ) 12.5 MG tablet Take 1 tablet (12.5 mg total) by mouth every 6 (six) hours as needed for up to 5 days for nausea or vomiting. 20 tablet 0   senna-docusate (SENOKOT-S) 8.6-50 MG tablet Take 1 tablet by mouth 2 (two) times daily. 15 tablet 0   SLYND  4 MG TABS Take 1 tablet by mouth daily.     predniSONE  (STERAPRED UNI-PAK 21 TAB) 10 MG (21) TBPK tablet Take as  directed over 6 days. Follow the dosing schedule on the blister pack. (Patient not taking: Reported on 06/14/2024) 1 each 0   No current facility-administered medications for this visit.    OBJECTIVE: Vitals:   06/14/24 0900  BP: 122/78  Pulse: 90  Resp: 18  Temp: 98.7 F (37.1 C)  SpO2: 99%     Body mass index is 39.87 kg/m.    ECOG FS:0 - Asymptomatic  General: Well-developed, well-nourished, no acute distress. Eyes: Pink conjunctiva, anicteric sclera. HEENT: Normocephalic, moist mucous membranes.  No palpable lymphadenopathy. Lungs: No audible wheezing or coughing. Heart: Regular rate and rhythm. Abdomen: Soft, nontender, no obvious distention. Musculoskeletal: No edema, cyanosis, or clubbing. Neuro: Alert, answering all questions appropriately. Cranial nerves grossly intact. Skin: No rashes or petechiae noted. Psych: Normal affect.  LAB RESULTS:  Lab Results  Component Value Date   NA 137 06/14/2024   K 3.9 06/14/2024   CL 105 06/14/2024   CO2 24 06/14/2024   GLUCOSE 113 (H) 06/14/2024   BUN 6 06/14/2024   CREATININE 0.81 06/14/2024   CALCIUM 9.0 06/14/2024   PROT 7.4 05/17/2024   ALBUMIN 3.5 05/17/2024   AST 16 05/17/2024   ALT 19 05/17/2024   ALKPHOS 66 05/17/2024   BILITOT 0.7 05/17/2024   GFRNONAA >60 06/14/2024   GFRAA >60 12/05/2019    Lab Results  Component Value Date   WBC 10.9 (H) 06/14/2024   NEUTROABS 8.7 (H) 06/14/2024   HGB 10.2 (L) 06/14/2024   HCT 30.8 (L) 06/14/2024   MCV 90.3 06/14/2024   PLT 472 (H) 06/14/2024     STUDIES: NM PET Image Initial (PI) Skull Base To Thigh (F-18 FDG) Result Date: 05/26/2024 EXAM: PET AND CT SKULL BASE TO MID THIGH 05/26/2024 11:48:20 AM TECHNIQUE: RADIOPHARMACEUTICAL: 11.4 mCi F-18 FDG Uptake time 60 minutes. Glucose level 107 mg/dl. Blood pool SUV: 2.3 PET imaging was acquired from the base of the skull to the mid thighs. Non-contrast enhanced computed tomography was obtained for attenuation correction  and anatomic localization. COMPARISON: Neck CT 05/16/2024 and CT abdomen/pelvis 04/05/2022. CLINICAL HISTORY: Initial staging for squamous cell carcinoma, nasopharyngeal carcinoma, right neck swelling. FINDINGS: HEAD AND NECK: The right nasopharyngeal mass has maximal SUV of 14.5. Hypermetabolic right retropharyngeal, bilateral level IIa, bilateral level IIb, bilateral level IIIa, bilateral level IIIb, and right level IV adenopathy observed. Some of these lymph nodes, including the right retropharyngeal node, demonstrate central necrosis. An index right level IIa lymph node measuring 3.4 cm in short axis on image 26 of series 6 has a maximum SUV of 16.1. Chronic left maxillary sinusitis. CHEST: No metabolically active pulmonary nodules. No metabolically active lymphadenopathy. ABDOMEN AND PELVIS: No metabolically active intraperitoneal mass. No metabolically active lymphadenopathy. Physiologic activity within the gastrointestinal and genitourinary systems. BONES AND SOFT TISSUE: Low-grade diffuse accentuation of osseous metabolic activity without focal lesion observed. This can sometimes be seen with granulocyte stimulation. IMPRESSION: 1. Hypermetabolic right nasopharyngeal mass (SUV  max 14.5). 2. Extensive hypermetabolic cervical nodal metastases including right retropharyngeal and bilateral levels IIIII, some with central necrosis; index right level IIA node 3.4 cm (SUV max 16.1). Small but faintly hypermetabolic right level IV lymph node. 3. Chronic left maxillary sinusitis. 4. Low-grade diffuse osseous metabolic accentuation without focal lesion, which can be seen with granulocyte stimulation. Electronically signed by: Ryan Salvage MD 05/26/2024 12:05 PM EDT RP Workstation: [id number]   IR LYMPH NODE CORE BIOPSY Result Date: 05/17/2024 INDICATION: 28 year old female with pathologic right submandibular lymphadenopathy. She presents for ultrasound-guided core biopsy. EXAM: ULTRASOUND LYMPH NODE BIOPSY  MEDICATIONS: None. ANESTHESIA/SEDATION: Moderate (conscious) sedation was employed during this procedure. A total of Versed  1.5 mg and Fentanyl  75 mcg was administered intravenously. Moderate Sedation Time: 13 minutes. The patient's level of consciousness and vital signs were monitored continuously by radiology nursing throughout the procedure under my direct supervision. FLUOROSCOPY TIME:  None. COMPLICATIONS: None immediate. PROCEDURE: Informed written consent was obtained from the patient after a thorough discussion of the procedural risks, benefits and alternatives. All questions were addressed. Maximal Sterile Barrier Technique was utilized including caps, mask, sterile gowns, sterile gloves, sterile drape, hand hygiene and skin antiseptic. A timeout was performed prior to the initiation of the procedure. The right submandibular space was interrogated with ultrasound. A large hypoechoic lymph node is visualized. A suitable skin entry site was selected and marked. Local anesthesia was attained by infiltration with 1% lidocaine . A small dermatotomy was made. Under real-time ultrasound guidance, a numerous 18 gauge core biopsies were obtained. The biopsy specimens were fragmented and miniscule due to the necrotic nature of the lymph node. At least 10 biopsy passes were obtained. The biopsy specimens were divided between formalin and a saline moistened Telfa pad and then delivered to pathology for further analysis. Post biopsy imaging demonstrates no evidence of immediate complication. IMPRESSION: Successful ultrasound-guided core biopsy of right submandibular lymph node. Electronically Signed   By: Wilkie Lent M.D.   On: 05/17/2024 16:02   CT Soft Tissue Neck W Contrast Result Date: 05/16/2024 EXAM: CT NECK WITH CONTRAST 05/16/2024 09:34:18 AM TECHNIQUE: CT of the neck was performed with the administration of intravenous contrast. Multiplanar reformatted images are provided for review. Automated exposure  control, iterative reconstruction, and/or weight based adjustment of the mA/kV was utilized to reduce the radiation dose to as low as reasonably achievable. COMPARISON: CT of the neck dated 04/19/2024. CLINICAL HISTORY: Soft tissue infection suspected, neck, xray done. Pt to ED via POV from home. Pt ambulatory to triage. Pt reports lump on right side of neck that started a few days ago. Pt reports seen on 8/19 for a lump on the left side that went away. Pt reports pain is worse this time. Pt was admitted on 8/19 for cervical lymphadenitis. FINDINGS: AERODIGESTIVE TRACT: No discrete mass. No edema. An area of nodular thickening in the right nasopharynx seen on image 18 of series 2 appears to have mildly worsened in the interim from approximately 2.4 x 1.9 cm to approximately 2.7 x 2.2 cm. SALIVARY GLANDS: The parotid and submandibular glands are unremarkable. THYROID : Unremarkable. LYMPH NODES: There has been a mixed response to interval therapy. Large suppurative left-sided level 2 and 3 lymph nodes have significantly decreased in size in the interim and there has been interval resolution of surrounding inflammatory stranding. A left level 2 node which previously measured 3.4 x 3.3 x 3.9 cm now measures approximately 2.9 x 2.4 x 2.8 cm. A left level 3 node which previously  measured 3.5 x 2.9 x 4.0 cm now measures approximately 2.8 x 2.3 x 3.2 cm. However, a primarily solid right cervical node which previously measured approximately 3.6 x 3.3 x 5.8 cm now measures approximately 4.0 x 3.9 x 6.5 cm. A right retropharyngeal node which was previously heterogeneous in attenuation is now diffusely cystic. Had previously measured approximately 2.4 x 2.1 x 3.7 cm and now measures approximately 2.6 x 2.3 x 4.4 cm. There are several additional mildly enlarged cervical lymph nodes which have not changed significantly in the interim. SOFT TISSUES: No mass or fluid collection. BRAIN, ORBITS, SINUSES AND MASTOIDS: No acute  abnormality. LUNGS AND MEDIASTINUM: No acute abnormality. BONES: No focal bone abnormality. IMPRESSION: 1. Mixed response to interval therapy for cervical lymphadenitis. 2. Significant decrease in size of large suppurative left-sided level 2 and 3 lymph nodes with interval resolution of surrounding inflammatory stranding. 3. Increase in size of a primarily solid right cervical node and a right retropharyngeal node, now diffusely cystic. 4. Mild worsening of nodular thickening in the right nasopharynx. An atypical organism or underlying neoplasm cannot be definitively excluded. Continued follow-up to ensure resolution is strongly advised. Electronically signed by: Evalene Coho MD 05/16/2024 10:21 AM EDT RP Workstation: [id number]    ASSESSMENT: Stage III EBV positive nasopharyngeal carcinoma.  PLAN:    Stage III EBV positive nasopharyngeal carcinoma: PET scan results from May 26, 2024 reviewed independently confirming stage of disease.  Patient will benefit from concurrent chemotherapy using weekly cisplatin along with daily XRT.  We discussed the possibility of port placement, but patient declined at this time. Proceed with cycle 1 of cisplatin today.  Continue daily XRT.  Return to clinic in 1 week for further evaluation and consideration of cycle 2.   Back pain: Patient was given a prescription for Flexeril  today. Leukocytosis: Likely reactive.  Improving. Anemia: Patient's hemoglobin has decreased to 10.2, monitor. Thrombocytosis: Likely reactive.   Patient expressed understanding and was in agreement with this plan. She also understands that She can call clinic at any time with any questions, concerns, or complaints.    Cancer Staging  Nasopharyngeal carcinoma [facility]) Staging form: Pharynx - Nasopharynx, AJCC V9 - Clinical stage from 05/30/2024: Stage III (cTX, cN3, cM0) - Signed by [name], MD on 05/30/2024 Stage prefix: Initial diagnosis Method of lymph node assessment:  Clinical   Evalene PARAS Jacobo, MD   06/14/2024 9:18 AM

## 2024-06-14 NOTE — Progress Notes (Signed)
 [facility] Clinical Social Work  Initial Assessment   Ajla Billiot is a 28 y.o. year old female contacted by phone. Clinical Social Work was referred by medical provider for assessment of psychosocial needs.   SDOH (Social Determinants of Health) assessments performed: Yes   SDOH Screenings   Food Insecurity: No Food Insecurity (05/30/2024)  Housing: Low Risk  (05/30/2024)  Transportation Needs: No Transportation Needs (05/30/2024)  Utilities: Not At Risk (05/30/2024)  Alcohol Screen: Low Risk  (05/30/2024)  Depression (PHQ2-9): Low Risk  (06/14/2024)  Tobacco Use: Medium Risk (06/14/2024)    PHQ 2/9:    06/14/2024    9:46 AM 05/30/2024   11:03 AM 01/24/2020    1:47 PM  Depression screen PHQ 2/9  Decreased Interest 1 0 1  Down, Depressed, Hopeless 1 0 1  PHQ - 2 Score 2 0 2  Altered sleeping   0  Tired, decreased energy   1  Change in appetite   1  Feeling bad or failure about yourself    2  Trouble concentrating   0  Moving slowly or fidgety/restless   0  Suicidal thoughts   0  PHQ-9 Score   6     Distress Screen completed: No    06/14/2024    9:55 AM  ONCBCN DISTRESS SCREENING  Screening Type Initial Screening  How much distress have you been experiencing in the past week? (0-10) 6  Practical concerns type Taking care of myself;Safety;Work  Social concerns type Relationship with spouse or partner  Emotional concerns type Worry or anxiety;Sadness or depression;Fear;Changes in appearance;Feelings of worthlessness or being a burden  Spiritual/Religous concerns type Death, dying, or afterlife;Conflict between beliefs and cancer treatments  Physical Concerns Type  Sleep;Pain;Memory or concentration;Changes in eating;Loss or change of physical abilities      Family/Social Information:  Housing Arrangement: patient lives with a friend and her parents. Family members/support persons in your life? Friends Transportation concerns: no  Employment: Unemployed  Income source:  Supported by [employer] and Friends Financial concerns: Yes, due to illness and/or loss of work during treatment Type of concern: Food Food access concerns: yes.  Patient stated her food stamps were discontinued. Religious or spiritual practice: Yes Advanced directives: No Services Currently in place:  Medicaid  Coping/ Adjustment to diagnosis: Patient understands treatment plan and what happens next? yes Concerns about diagnosis and/or treatment: Losing my job and/or losing income Patient reported stressors: Therapist, art and/or priorities: Family and friends. Patient enjoys time with family/ friends Current coping skills/ strengths: Capable of independent living , Manufacturing systems engineer , General fund of knowledge , and Supportive family/friends     SUMMARY: Current SDOH Barriers:  Financial constraints related to loss of employment  Clinical Social Work Clinical Goal(s):  Explore community resource options for unmet needs related to:  Financial Strain   Interventions: Discussed common feeling and emotions when being diagnosed with cancer, and the importance of support during treatment Informed patient of the support team roles and support services at [facility] Provided CSW contact information and encouraged patient to call with any questions or concerns Provided patient with information about available resources.  CSW obtained information to make social security disability referral to the [facility].  Will also make referral to Samule Bertrand so that patient can receive a [employer] card.  Patient declined counseling at this time.    Follow Up Plan: CSW will follow-up with patient by phone  Patient verbalizes understanding of plan: Yes    Macario  CHRISTELLA Au, LCSW Clinical Social Worker  Cancer Center  Patient is participating in a Managed Medicaid Plan:  Yes

## 2024-06-15 ENCOUNTER — Ambulatory Visit
Admission: RE | Admit: 2024-06-15 | Discharge: 2024-06-15 | Disposition: A | Discharge status: Home / Self Care | Source: Ambulatory Visit | Attending: Radiation Oncology | Admitting: Radiation Oncology

## 2024-06-15 ENCOUNTER — Other Ambulatory Visit: Payer: Self-pay

## 2024-06-15 ENCOUNTER — Telehealth: Payer: Self-pay | Admitting: *Deleted

## 2024-06-15 ENCOUNTER — Other Ambulatory Visit: Payer: Self-pay | Admitting: *Deleted

## 2024-06-15 ENCOUNTER — Telehealth: Payer: Self-pay | Admitting: Pharmacy Technician

## 2024-06-15 DIAGNOSIS — Z510 Encounter for antineoplastic radiation therapy: Secondary | ICD-10-CM | POA: Diagnosis not present

## 2024-06-15 LAB — RAD ONC ARIA SESSION SUMMARY
Course Elapsed Days: 2
Plan Fractions Treated to Date: 3
Plan Prescribed Dose Per Fraction: 2 Gy
Plan Total Fractions Prescribed: 35
Plan Total Prescribed Dose: 70 Gy
Reference Point Dosage Given to Date: 6 Gy
Reference Point Session Dosage Given: 2 Gy
Session Number: 3

## 2024-06-15 MED ORDER — ALPRAZOLAM 0.25 MG PO TABS: 0.2500 mg | ORAL_TABLET | Freq: Every evening | ORAL | 0 refills | Status: DC | PRN

## 2024-06-15 NOTE — Telephone Encounter (Signed)
 Pharmacist said that Dr. Georgina again was going to be giving her some Xanax  and they have not seen the prescription yet.  Dr. Bonney it about 10 minutes after she sent a message to us .  The pharmacist says that she is working on it right now and when it is done she is going to call the patient.

## 2024-06-15 NOTE — Telephone Encounter (Signed)
 Received referral from medical staff to talk with patient about [employer] grant.  Patient does not receive food stamps or any other governmental assistance, therefore, she does not meet the eligibility criteria for the grant.  Contacted patient to make aware.  Brandi [name] Patient Pharmacologist [facility]

## 2024-06-16 ENCOUNTER — Other Ambulatory Visit: Payer: Self-pay

## 2024-06-16 ENCOUNTER — Ambulatory Visit
Admission: RE | Admit: 2024-06-16 | Discharge: 2024-06-16 | Disposition: A | Source: Ambulatory Visit | Attending: Radiation Oncology | Admitting: Radiation Oncology

## 2024-06-16 DIAGNOSIS — Z510 Encounter for antineoplastic radiation therapy: Secondary | ICD-10-CM | POA: Diagnosis not present

## 2024-06-16 LAB — RAD ONC ARIA SESSION SUMMARY
Course Elapsed Days: 3
Plan Fractions Treated to Date: 4
Plan Prescribed Dose Per Fraction: 2 Gy
Plan Total Fractions Prescribed: 35
Plan Total Prescribed Dose: 70 Gy
Reference Point Dosage Given to Date: 8 Gy
Reference Point Session Dosage Given: 2 Gy
Session Number: 4

## 2024-06-18 ENCOUNTER — Other Ambulatory Visit: Payer: Self-pay

## 2024-06-18 ENCOUNTER — Emergency Department
Admission: EM | Admit: 2024-06-18 | Discharge: 2024-06-18 | Disposition: A | Discharge status: Home / Self Care | Attending: Emergency Medicine | Admitting: Emergency Medicine

## 2024-06-18 ENCOUNTER — Emergency Department

## 2024-06-18 DIAGNOSIS — R109 Unspecified abdominal pain: Secondary | ICD-10-CM | POA: Insufficient documentation

## 2024-06-18 DIAGNOSIS — R509 Fever, unspecified: Secondary | ICD-10-CM | POA: Diagnosis not present

## 2024-06-18 DIAGNOSIS — M545 Low back pain, unspecified: Secondary | ICD-10-CM | POA: Insufficient documentation

## 2024-06-18 DIAGNOSIS — M549 Dorsalgia, unspecified: Secondary | ICD-10-CM | POA: Diagnosis present

## 2024-06-18 DIAGNOSIS — D72829 Elevated white blood cell count, unspecified: Secondary | ICD-10-CM | POA: Diagnosis not present

## 2024-06-18 HISTORY — DX: Malignant (primary) neoplasm, unspecified: C80.1

## 2024-06-18 LAB — COMPREHENSIVE METABOLIC PANEL WITH GFR
ALT: 15 U/L (ref 0–44)
AST: 14 U/L — ABNORMAL LOW (ref 15–41)
Albumin: 3.3 g/dL — ABNORMAL LOW (ref 3.5–5.0)
Alkaline Phosphatase: 61 U/L (ref 38–126)
Anion gap: 15 (ref 5–15)
BUN: 8 mg/dL (ref 6–20)
CO2: 20 mmol/L — ABNORMAL LOW (ref 22–32)
Calcium: 9.2 mg/dL (ref 8.9–10.3)
Chloride: 99 mmol/L (ref 98–111)
Creatinine, Ser: 0.72 mg/dL (ref 0.44–1.00)
GFR, Estimated: >60 mL/min (ref >60)
Glucose, Bld: 118 mg/dL — ABNORMAL HIGH (ref 70–99)
Potassium: 3.5 mmol/L (ref 3.5–5.1)
Sodium: 134 mmol/L — ABNORMAL LOW (ref 135–145)
Total Bilirubin: 0.5 mg/dL (ref 0.0–1.2)
Total Protein: 7.6 g/dL (ref 6.5–8.1)

## 2024-06-18 LAB — URINALYSIS, ROUTINE W REFLEX MICROSCOPIC
Bilirubin Urine: NEGATIVE
Glucose, UA: NEGATIVE mg/dL
Ketones, ur: NEGATIVE mg/dL
Leukocytes,Ua: NEGATIVE
Nitrite: NEGATIVE
Protein, ur: NEGATIVE mg/dL
RBC / HPF: >50 RBC/hpf (ref 0–5)
Specific Gravity, Urine: 1.008 (ref 1.005–1.030)
pH: 8 (ref 5.0–8.0)

## 2024-06-18 LAB — CBC
HCT: 32.9 % — ABNORMAL LOW (ref 36.0–46.0)
Hemoglobin: 10.7 g/dL — ABNORMAL LOW (ref 12.0–15.0)
MCH: 29.1 pg (ref 26.0–34.0)
MCHC: 32.5 g/dL (ref 30.0–36.0)
MCV: 89.4 fL (ref 80.0–100.0)
Platelets: 514 K/uL — ABNORMAL HIGH (ref 150–400)
RBC: 3.68 MIL/uL — ABNORMAL LOW (ref 3.87–5.11)
RDW: 13.1 % (ref 11.5–15.5)
WBC: 14.8 K/uL — ABNORMAL HIGH (ref 4.0–10.5)
nRBC: 0 % (ref 0.0–0.2)

## 2024-06-18 LAB — LIPASE, BLOOD: Lipase: 23 U/L (ref 11–51)

## 2024-06-18 LAB — POC URINE PREG, ED: Preg Test, Ur: NEGATIVE

## 2024-06-18 MED ORDER — HYDROCODONE-ACETAMINOPHEN 5-325 MG PO TABS: 1.0000 | ORAL_TABLET | Freq: Four times a day (QID) | ORAL | 0 refills | Status: DC | PRN

## 2024-06-18 MED ORDER — MORPHINE SULFATE (PF) 4 MG/ML IV SOLN
4.0000 mg | Freq: Once | INTRAVENOUS | Status: AC
  Administered 2024-06-18: 4 mg via INTRAVENOUS
  Filled 2024-06-18: qty 1

## 2024-06-18 MED ORDER — SODIUM CHLORIDE 0.9 % IV BOLUS
1000.0000 mL | Freq: Once | INTRAVENOUS | Status: AC
  Administered 2024-06-18: 1000 mL via INTRAVENOUS

## 2024-06-18 MED ORDER — IOHEXOL 300 MG/ML  SOLN
100.0000 mL | Freq: Once | INTRAMUSCULAR | Status: AC | PRN
  Administered 2024-06-18: 100 mL via INTRAVENOUS

## 2024-06-18 NOTE — ED Provider Notes (Signed)
 [facility] Provider Note    Event Date/Time   First MD Initiated Contact with Patient 06/18/24 1012     (approximate)   History   Abdominal Pain and Back Pain   HPI  Brandi [name] is a 28 y.o. female who presents to the emergency department today because of concern for back and abdominal pain. Pain started about a week ago. She denies any trauma. She denies any associated nausea or diarrhea with the pain.  She has had subjective fevers.  She denies any change with urination or defecation.  She has been giving muscle relaxers for her back pain and she tried taking two last night without any significant relief.     Physical Exam   Triage Vital Signs: ED Triage Vitals  Encounter Vitals Group     BP 06/18/24 0808 (!) 180/101     Girls Systolic BP Percentile --      Girls Diastolic BP Percentile --      Boys Systolic BP Percentile --      Boys Diastolic BP Percentile --      Pulse Rate 06/18/24 0808 98     Resp 06/18/24 0808 19     Temp 06/18/24 0808 98 F (36.7 C)     Temp src --      SpO2 06/18/24 0808 100 %     Weight 06/18/24 0807 219 lb (99.3 kg)     Height 06/18/24 0807 5' 2 (1.575 m)     Head Circumference --      Peak Flow --      Pain Score 06/18/24 0807 10     Pain Loc --      Pain Education --      Exclude from Growth Chart --     Most recent vital signs: Vitals:   06/18/24 0808  BP: (!) 180/101  Pulse: 98  Resp: 19  Temp: 98 F (36.7 C)  SpO2: 100%   General: Awake, alert, oriented. CV:  Good peripheral perfusion. Regular rate and rhythm. Resp:  Normal effort. Lungs clear. Abd:  No distention. Minimal left sided tenderness.    ED Results / Procedures / Treatments   Labs (all labs ordered are listed, but only abnormal results are displayed) Labs Reviewed  COMPREHENSIVE METABOLIC PANEL WITH GFR - Abnormal; Notable for the following components:      Result Value   Sodium 134 (*)    CO2 20 (*)    Glucose, Bld 118 (*)     Albumin 3.3 (*)    AST 14 (*)    All other components within normal limits  CBC - Abnormal; Notable for the following components:   WBC 14.8 (*)    RBC 3.68 (*)    Hemoglobin 10.7 (*)    HCT 32.9 (*)    Platelets 514 (*)    All other components within normal limits  LIPASE, BLOOD  URINALYSIS, ROUTINE W REFLEX MICROSCOPIC  POC URINE PREG, ED     EKG  None   RADIOLOGY I independently interpreted and visualized the CT abd/pel. My interpretation: No free air Radiology interpretation:  IMPRESSION:  1. No acute findings in the abdomen or pelvis.  2. Scattered colonic diverticula without evidence of diverticulitis.      PROCEDURES:  Critical Care performed: No   MEDICATIONS ORDERED IN ED: Medications - No data to display   IMPRESSION / MDM / ASSESSMENT AND PLAN / ED COURSE  I reviewed the triage vital signs and  the nursing notes.                              Differential diagnosis includes, but is not limited to, UTI, pyelonephritis, kidney stone, diverticulitis, neoplastic disease  Patient's presentation is most consistent with acute presentation with potential threat to life or bodily function.   The patient is on the cardiac monitor to evaluate for evidence of arrhythmia and/or significant heart rate changes.  Patient presented to the emergency department today because of concerns for left low back and abdominal pain.  On exam she does have some tenderness to the left abdomen.  She is afebrile.  Blood work does show slight leukocytosis.  Will give IV fluids, pain medication.  Will check imaging to evaluate for any acute pathology.  Patient did feel better after medication. CT scan without concerning abnormality. At this time do think it would be reasonable for discharge. Will give patient prescription for pain medication.    FINAL CLINICAL IMPRESSION(S) / ED DIAGNOSES   Final diagnoses:  Low back pain without sciatica, unspecified back pain laterality,  unspecified chronicity      Note:  This document was prepared using Dragon voice recognition software and may include unintentional dictation errors.    Floy Roberts, MD 06/18/24 [phone]

## 2024-06-18 NOTE — ED Notes (Signed)
 Pt to CT at this time.

## 2024-06-18 NOTE — ED Triage Notes (Signed)
 Pt comes with c/o back pain and some lower belly pain. Pt states she had 1st round of chemo on Wed and radiation everyday except the weekend. Pt states since that her back has been hurting. Pt states she thinks they didn't flush her out good enough. Pt states she only got two bags of fluids and it was suppose to be 4.    Pt states no N/V

## 2024-06-19 ENCOUNTER — Ambulatory Visit
Admission: RE | Admit: 2024-06-19 | Discharge: 2024-06-19 | Disposition: A | Source: Ambulatory Visit | Attending: Radiation Oncology | Admitting: Radiation Oncology

## 2024-06-19 ENCOUNTER — Other Ambulatory Visit: Payer: Self-pay

## 2024-06-19 ENCOUNTER — Inpatient Hospital Stay

## 2024-06-19 DIAGNOSIS — Z510 Encounter for antineoplastic radiation therapy: Secondary | ICD-10-CM | POA: Diagnosis not present

## 2024-06-19 LAB — RAD ONC ARIA SESSION SUMMARY
Course Elapsed Days: 6
Plan Fractions Treated to Date: 5
Plan Prescribed Dose Per Fraction: 2 Gy
Plan Total Fractions Prescribed: 35
Plan Total Prescribed Dose: 70 Gy
Reference Point Dosage Given to Date: 10 Gy
Reference Point Session Dosage Given: 2 Gy
Session Number: 5

## 2024-06-20 ENCOUNTER — Inpatient Hospital Stay

## 2024-06-20 ENCOUNTER — Ambulatory Visit
Admission: RE | Admit: 2024-06-20 | Discharge: 2024-06-20 | Disposition: A | Discharge status: Home / Self Care | Source: Ambulatory Visit | Attending: Radiation Oncology | Admitting: Radiation Oncology

## 2024-06-20 ENCOUNTER — Other Ambulatory Visit: Payer: Self-pay

## 2024-06-20 DIAGNOSIS — Z510 Encounter for antineoplastic radiation therapy: Secondary | ICD-10-CM | POA: Diagnosis not present

## 2024-06-20 LAB — RAD ONC ARIA SESSION SUMMARY
Course Elapsed Days: 7
Plan Fractions Treated to Date: 6
Plan Prescribed Dose Per Fraction: 2 Gy
Plan Total Fractions Prescribed: 35
Plan Total Prescribed Dose: 70 Gy
Reference Point Dosage Given to Date: 12 Gy
Reference Point Session Dosage Given: 2 Gy
Session Number: 6

## 2024-06-20 NOTE — Progress Notes (Signed)
 [facility] CSW Progress Note   Clinical Social Work introduced self to patient during Patient Education with Raoul Moats, Charity fundraiser.  Provided information regarding CSW role, including counseling, advanced care planning and support group.  Answered questions as needed.    Follow Up Plan:  CSW will follow-up with patient by phone     Macario CHRISTELLA Au, LCSW Clinical Social Worker [facility]    Patient is participating in a Managed Medicaid Plan:  Yes

## 2024-06-21 ENCOUNTER — Other Ambulatory Visit: Payer: Self-pay

## 2024-06-21 ENCOUNTER — Inpatient Hospital Stay

## 2024-06-21 ENCOUNTER — Ambulatory Visit
Admission: RE | Admit: 2024-06-21 | Discharge: 2024-06-21 | Disposition: A | Source: Ambulatory Visit | Attending: Radiation Oncology | Admitting: Radiation Oncology

## 2024-06-21 ENCOUNTER — Inpatient Hospital Stay: Admitting: Oncology

## 2024-06-21 ENCOUNTER — Encounter: Payer: Self-pay | Admitting: Oncology

## 2024-06-21 VITALS — BP 135/87 | HR 88 | Temp 98.2°F | Resp 18 | Wt 210.2 lb

## 2024-06-21 DIAGNOSIS — C119 Malignant neoplasm of nasopharynx, unspecified: Secondary | ICD-10-CM

## 2024-06-21 DIAGNOSIS — R112 Nausea with vomiting, unspecified: Secondary | ICD-10-CM

## 2024-06-21 DIAGNOSIS — Z510 Encounter for antineoplastic radiation therapy: Secondary | ICD-10-CM | POA: Diagnosis not present

## 2024-06-21 LAB — RAD ONC ARIA SESSION SUMMARY
Course Elapsed Days: 8
Plan Fractions Treated to Date: 7
Plan Prescribed Dose Per Fraction: 2 Gy
Plan Total Fractions Prescribed: 35
Plan Total Prescribed Dose: 70 Gy
Reference Point Dosage Given to Date: 14 Gy
Reference Point Session Dosage Given: 2 Gy
Session Number: 7

## 2024-06-21 LAB — CBC WITH DIFFERENTIAL (CANCER CENTER ONLY)
Abs Immature Granulocytes: 0.19 K/uL — ABNORMAL HIGH (ref 0.00–0.07)
Basophils Absolute: 0 K/uL (ref 0.0–0.1)
Basophils Relative: 0 %
Eosinophils Absolute: 0.1 K/uL (ref 0.0–0.5)
Eosinophils Relative: 1 %
HCT: 32.8 % — ABNORMAL LOW (ref 36.0–46.0)
Hemoglobin: 11.1 g/dL — ABNORMAL LOW (ref 12.0–15.0)
Immature Granulocytes: 2 %
Lymphocytes Relative: 7 %
Lymphs Abs: 0.7 K/uL (ref 0.7–4.0)
MCH: 29.5 pg (ref 26.0–34.0)
MCHC: 33.8 g/dL (ref 30.0–36.0)
MCV: 87.2 fL (ref 80.0–100.0)
Monocytes Absolute: 0.8 K/uL (ref 0.1–1.0)
Monocytes Relative: 7 %
Neutro Abs: 9.3 K/uL — ABNORMAL HIGH (ref 1.7–7.7)
Neutrophils Relative %: 83 %
Platelet Count: 581 K/uL — ABNORMAL HIGH (ref 150–400)
RBC: 3.76 MIL/uL — ABNORMAL LOW (ref 3.87–5.11)
RDW: 13 % (ref 11.5–15.5)
WBC Count: 11.1 K/uL — ABNORMAL HIGH (ref 4.0–10.5)
nRBC: 0 % (ref 0.0–0.2)

## 2024-06-21 LAB — CMP (CANCER CENTER ONLY)
ALT: 20 U/L (ref 0–44)
AST: 18 U/L (ref 15–41)
Albumin: 3.5 g/dL (ref 3.5–5.0)
Alkaline Phosphatase: 64 U/L (ref 38–126)
Anion gap: 13 (ref 5–15)
BUN: 10 mg/dL (ref 6–20)
CO2: 21 mmol/L — ABNORMAL LOW (ref 22–32)
Calcium: 9.3 mg/dL (ref 8.9–10.3)
Chloride: 98 mmol/L (ref 98–111)
Creatinine: 0.8 mg/dL (ref 0.44–1.00)
GFR, Estimated: >60 mL/min (ref >60)
Glucose, Bld: 121 mg/dL — ABNORMAL HIGH (ref 70–99)
Potassium: 3.6 mmol/L (ref 3.5–5.1)
Sodium: 132 mmol/L — ABNORMAL LOW (ref 135–145)
Total Bilirubin: 0.7 mg/dL (ref 0.0–1.2)
Total Protein: 7.9 g/dL (ref 6.5–8.1)

## 2024-06-21 LAB — MAGNESIUM: Magnesium: 2.3 mg/dL (ref 1.7–2.4)

## 2024-06-21 MED ORDER — SODIUM CHLORIDE 0.9 % IV SOLN
Freq: Once | INTRAVENOUS | Status: AC
  Filled 2024-06-21: qty 250

## 2024-06-21 MED ORDER — LORAZEPAM 2 MG/ML IJ SOLN
1.0000 mg | Freq: Once | INTRAMUSCULAR | Status: AC
  Administered 2024-06-21: 1 mg via INTRAVENOUS
  Filled 2024-06-21: qty 1

## 2024-06-21 NOTE — Progress Notes (Signed)
 Pt reports to clinic this morning with Nausea and Vomiting, pt reports she had a BM this morning X3 but was hard and strained. Pt reports tenderness and tightness in abdomen 9lb weight loss noted. MD and team made aware. Pt started on 1 liter NS over 1 hour and given Ativan  IV 1 mg per MD orders.   Approx 1000 MD at chairside and reviewed symptoms and plan with pt. Pt reports feeling better at this time.  Per Dr. Jacobo okay to give pt a second liter of NS over one hour and pt to return to clinic tomorrow 06/22/24 for treatment. Pt agrees with plan.   1200: AVS printed for pt and reviewed. Pt stable at discharge,

## 2024-06-21 NOTE — Progress Notes (Unsigned)
 Patient here today for follow up regarding head and neck cancer, consideration of concurrent treatment with radiation and Cisplatin. Patient reports nausea and vomiting, is vomiting in clinic. Patient reports constipation, poor PO intake.

## 2024-06-21 NOTE — Progress Notes (Signed)
 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  Past medical history of GERD, HTN.  Patient receiving concurrent cisplatin and radiation.   Met with patient during IV fluids.  Patient not feeling well, nausea, vomiting.  Says that she has no taste.  Lives with girlfriend and her family.  Says that she is a picky eater.  Reports that her appetite has been decreased for about 4-5 years.  Says that currently she is not having pain with swallowing foods.    Typically likes fried chicken, chips, pudding, popsicles.     Nutrition Focused Physical Exam:   Not performed, patient not feeling well   Medications: compazine, zofran , sennakot   Labs: reviewed   Anthropometrics:   Height: 62 inches Weight: 210 lb today 219 lb on 10/19 216 lb on 8/21 235 lb on 02/29/24 233 lb on 08/05/23  BMI: 38  11% weight loss in the last 3 1/2 months, significant   Estimated Energy Needs  Kcals: 2000-2300 Protein: 95-114 g Fluid: > 2000 ml   NUTRITION DIAGNOSIS: Inadequate oral intake related to cancer and related treatment side effects as evidenced by 11% weight loss in the last 3 1/2 months and eating less than 50% of estimated energy needs for > 5 days.     INTERVENTION:  Encouraged taking antiemetics at home to help with nausea Reviewed strategies to help with taste. Handout provided Reviewed foods to choose with nausea/vomiting.  Gave samples of ensure clear shake and discussed boost breeze as option as well.  These written down for patient and provided location to purchase them.  Contact information provided   MONITORING, EVALUATION, GOAL: weight trends, intake   Next Visit: Wed, Oct 29 during infusion  Amylee Lodato B. Dasie SOLON, CSO, LDN Registered Dietitian [phone]

## 2024-06-21 NOTE — Progress Notes (Unsigned)
 [facility]  Telephone:([phone] [phone]  ID: Brandi [name] OB: 1995-11-17  MR#: [id number]  RDW#:[id number]  Patient Care Team: [name], MD as PCP - General (Internal Medicine) [name], MD as Consulting Physician (Radiation Oncology)  CHIEF COMPLAINT: Stage III EBV positive nasopharyngeal carcinoma.  INTERVAL HISTORY: Patient returns to clinic today for further evaluation and initiation of cycle 1 of weekly cisplatin.  She is anxious, but otherwise feels well.  She has lower musculoskeletal back pain. She no longer complains of any neck pain.  She denies dysphagia.  She has no neurologic complaints.  She denies any recent fevers.  She has a good appetite and denies weight loss.  She has no chest pain, shortness of breath, cough, or hemoptysis.  She denies any nausea, vomiting, constipation, or diarrhea.  She has no urinary complaints.  Patient offers no further specific complaints today.  REVIEW OF SYSTEMS:   Review of Systems  Constitutional: Negative.  Negative for chills, fever and malaise/fatigue.  HENT:  Negative for sore throat.   Respiratory: Negative.  Negative for cough, hemoptysis and shortness of breath.   Cardiovascular: Negative.  Negative for chest pain and leg swelling.  Gastrointestinal: Negative.  Negative for abdominal pain.  Genitourinary: Negative.  Negative for dysuria.  Musculoskeletal:  Positive for back pain.  Skin: Negative.  Negative for rash.  Neurological: Negative.  Negative for dizziness, focal weakness, weakness and headaches.  Psychiatric/Behavioral: Negative.  The patient is not nervous/anxious.     As per HPI. Otherwise, a complete review of systems is negative.  PAST MEDICAL HISTORY: Past Medical History:  Diagnosis Date  . Asthma   . Cancer (HCC)   . GERD (gastroesophageal reflux disease)   . HTN (hypertension)     PAST SURGICAL HISTORY: Past Surgical History:  Procedure Laterality Date   . dental procedure    . IR US  LIVER BIOPSY  05/17/2024    FAMILY HISTORY: Family History  Problem Relation Age of Onset  . Hypertension Maternal Grandmother   . Diabetes Maternal Grandmother     ADVANCED DIRECTIVES (Y/N):  N  HEALTH MAINTENANCE: Social History   Tobacco Use  . Smoking status: Former    Types: E-cigarettes  . Smokeless tobacco: Never  Vaping Use  . Vaping status: Former  Substance Use Topics  . Alcohol use: Not Currently    Comment: social  . Drug use: Yes    Types: Marijuana     Colonoscopy:  PAP:  Bone density:  Lipid panel:  Allergies  Allergen Reactions  . Shellfish Allergy Anaphylaxis  . Tomato Other (See Comments)    Flares up exema    Current Outpatient Medications  Medication Sig Dispense Refill  . acetaminophen  (TYLENOL ) 325 MG tablet Take 2 tablets (650 mg total) by mouth every 6 (six) hours as needed for mild pain (pain score 1-3) or fever (or Fever >/= 101).    . ALPRAZolam  (XANAX ) 0.25 MG tablet Take 1 tablet (0.25 mg total) by mouth at bedtime as needed for anxiety. 30 tablet 0  . cyclobenzaprine  (FLEXERIL ) 10 MG tablet Take 1 tablet (10 mg total) by mouth 3 (three) times daily as needed for muscle spasms. 90 tablet 0  . HYDROcodone -acetaminophen  (NORCO/VICODIN) 5-325 MG tablet Take 1 tablet by mouth every 6 (six) hours as needed for moderate pain (pain score 4-6). 15 tablet 0  . ondansetron  (ZOFRAN ) 8 MG tablet Take 1 tablet (8 mg total) by mouth every 8 (eight) hours as needed for nausea  or vomiting. Start on the third day after cisplatin. 60 tablet 1  . oxyCODONE -acetaminophen  (PERCOCET/ROXICET) 5-325 MG tablet Take by mouth.    . pantoprazole  (PROTONIX ) 40 MG tablet Take 1 tablet (40 mg total) by mouth daily. 30 tablet 11  . predniSONE  (STERAPRED UNI-PAK 21 TAB) 10 MG (21) TBPK tablet Take as directed over 6 days. Follow the dosing schedule on the blister pack. (Patient not taking: Reported on 06/14/2024) 1 each 0  .  prochlorperazine (COMPAZINE) 10 MG tablet Take 1 tablet (10 mg total) by mouth every 6 (six) hours as needed (Nausea or vomiting). 60 tablet 1  . promethazine  (PHENERGAN ) 12.5 MG tablet Take 1 tablet (12.5 mg total) by mouth every 6 (six) hours as needed for up to 5 days for nausea or vomiting. 20 tablet 0  . senna-docusate (SENOKOT-S) 8.6-50 MG tablet Take 1 tablet by mouth 2 (two) times daily. 15 tablet 0  . SLYND  4 MG TABS Take 1 tablet by mouth daily.     No current facility-administered medications for this visit.   Facility-Administered Medications Ordered in Other Visits  Medication Dose Route Frequency Provider Last Rate Last Admin  . 0.9 %  sodium chloride  infusion   Intravenous Once Clements Toro J, MD 999 mL/hr at 06/21/24 9062 New Bag at 06/21/24 9062    OBJECTIVE: There were no vitals filed for this visit.    There is no height or weight on file to calculate BMI.    ECOG FS:0 - Asymptomatic  General: Well-developed, well-nourished, no acute distress. Eyes: Pink conjunctiva, anicteric sclera. HEENT: Normocephalic, moist mucous membranes.  No palpable lymphadenopathy. Lungs: No audible wheezing or coughing. Heart: Regular rate and rhythm. Abdomen: Soft, nontender, no obvious distention. Musculoskeletal: No edema, cyanosis, or clubbing. Neuro: Alert, answering all questions appropriately. Cranial nerves grossly intact. Skin: No rashes or petechiae noted. Psych: Normal affect.  LAB RESULTS:  Lab Results  Component Value Date   NA 132 (L) 06/21/2024   K 3.6 06/21/2024   CL 98 06/21/2024   CO2 21 (L) 06/21/2024   GLUCOSE 121 (H) 06/21/2024   BUN 10 06/21/2024   CREATININE 0.80 06/21/2024   CALCIUM 9.3 06/21/2024   PROT 7.9 06/21/2024   ALBUMIN 3.5 06/21/2024   AST 18 06/21/2024   ALT 20 06/21/2024   ALKPHOS 64 06/21/2024   BILITOT 0.7 06/21/2024   GFRNONAA >60 06/21/2024   GFRAA >60 12/05/2019    Lab Results  Component Value Date   WBC 11.1 (H) 06/21/2024    NEUTROABS 9.3 (H) 06/21/2024   HGB 11.1 (L) 06/21/2024   HCT 32.8 (L) 06/21/2024   MCV 87.2 06/21/2024   PLT 581 (H) 06/21/2024     STUDIES: CT ABDOMEN PELVIS W CONTRAST Result Date: 06/18/2024 EXAM: CT ABDOMEN AND PELVIS WITH CONTRAST 06/18/2024 12:25:32 PM TECHNIQUE: CT of the abdomen and pelvis was performed with the administration of 100 mL of iohexol  (OMNIPAQUE ) 300 MG/ML solution. Multiplanar reformatted images are provided for review. Automated exposure control, iterative reconstruction, and/or weight-based adjustment of the mA/kV was utilized to reduce the radiation dose to as low as reasonably achievable. COMPARISON: PET CT fusion study dated 05/26/2024 and CT of the abdomen and pelvis dated 04/05/2022. CLINICAL HISTORY: left lower back/abdominal pain. Pt comes with c/o back pain and some lower belly pain. Pt states she had 1st round of chemo on Wed and radiation everyday except the weekend. Pt states since that her back has been hurting. Pt states she thinks they didn't flush her  out good enough. Pt states she only got two bags of fluids and it was suppose to be 4. FINDINGS: LOWER CHEST: No acute abnormality. LIVER: The liver is unremarkable. GALLBLADDER AND BILE DUCTS: Gallbladder is unremarkable. No biliary ductal dilatation. SPLEEN: No acute abnormality. PANCREAS: No acute abnormality. ADRENAL GLANDS: No acute abnormality. KIDNEYS, URETERS AND BLADDER: No stones in the kidneys or ureters. No hydronephrosis. No perinephric or periureteral stranding. Urinary bladder is unremarkable. GI AND BOWEL: There are few scattered colonic diverticula. Stomach demonstrates no acute abnormality. There is no bowel obstruction. PERITONEUM AND RETROPERITONEUM: No ascites. No free air. VASCULATURE: Aorta is normal in caliber. LYMPH NODES: No lymphadenopathy. REPRODUCTIVE ORGANS: No acute abnormality. BONES AND SOFT TISSUES: No acute osseous abnormality. No focal soft tissue abnormality. IMPRESSION: 1. No  acute findings in the abdomen or pelvis. 2. Scattered colonic diverticula without evidence of diverticulitis. Electronically signed by: Evalene Coho MD 06/18/2024 12:55 PM EDT RP Workstation: [id number]   NM PET Image Initial (PI) Skull Base To Thigh (F-18 FDG) Result Date: 05/26/2024 EXAM: PET AND CT SKULL BASE TO MID THIGH 05/26/2024 11:48:20 AM TECHNIQUE: RADIOPHARMACEUTICAL: 11.4 mCi F-18 FDG Uptake time 60 minutes. Glucose level 107 mg/dl. Blood pool SUV: 2.3 PET imaging was acquired from the base of the skull to the mid thighs. Non-contrast enhanced computed tomography was obtained for attenuation correction and anatomic localization. COMPARISON: Neck CT 05/16/2024 and CT abdomen/pelvis 04/05/2022. CLINICAL HISTORY: Initial staging for squamous cell carcinoma, nasopharyngeal carcinoma, right neck swelling. FINDINGS: HEAD AND NECK: The right nasopharyngeal mass has maximal SUV of 14.5. Hypermetabolic right retropharyngeal, bilateral level IIa, bilateral level IIb, bilateral level IIIa, bilateral level IIIb, and right level IV adenopathy observed. Some of these lymph nodes, including the right retropharyngeal node, demonstrate central necrosis. An index right level IIa lymph node measuring 3.4 cm in short axis on image 26 of series 6 has a maximum SUV of 16.1. Chronic left maxillary sinusitis. CHEST: No metabolically active pulmonary nodules. No metabolically active lymphadenopathy. ABDOMEN AND PELVIS: No metabolically active intraperitoneal mass. No metabolically active lymphadenopathy. Physiologic activity within the gastrointestinal and genitourinary systems. BONES AND SOFT TISSUE: Low-grade diffuse accentuation of osseous metabolic activity without focal lesion observed. This can sometimes be seen with granulocyte stimulation. IMPRESSION: 1. Hypermetabolic right nasopharyngeal mass (SUV max 14.5). 2. Extensive hypermetabolic cervical nodal metastases including right retropharyngeal and bilateral  levels IIIII, some with central necrosis; index right level IIA node 3.4 cm (SUV max 16.1). Small but faintly hypermetabolic right level IV lymph node. 3. Chronic left maxillary sinusitis. 4. Low-grade diffuse osseous metabolic accentuation without focal lesion, which can be seen with granulocyte stimulation. Electronically signed by: Ryan Salvage MD 05/26/2024 12:05 PM EDT RP Workstation: [id number]    ASSESSMENT: Stage III EBV positive nasopharyngeal carcinoma.  PLAN:    Stage III EBV positive nasopharyngeal carcinoma: PET scan results from May 26, 2024 reviewed independently confirming stage of disease.  Patient will benefit from concurrent chemotherapy using weekly cisplatin along with daily XRT.  We discussed the possibility of port placement, but patient declined at this time. Proceed with cycle 1 of cisplatin today.  Continue daily XRT.  Return to clinic in 1 week for further evaluation and consideration of cycle 2.   Back pain: Patient was given a prescription for Flexeril  today. Leukocytosis: Likely reactive.  Improving. Anemia: Patient's hemoglobin has decreased to 10.2, monitor. Thrombocytosis: Likely reactive.   Patient expressed understanding and was in agreement with this plan. She also understands that She can call  clinic at any time with any questions, concerns, or complaints.    Cancer Staging  Nasopharyngeal carcinoma [facility]) Staging form: Pharynx - Nasopharynx, AJCC V9 - Clinical stage from 05/30/2024: Stage III (cTX, cN3, cM0) - Signed by [name], MD on 05/30/2024 Stage prefix: Initial diagnosis Method of lymph node assessment: Clinical   Evalene PARAS Jacobo, MD   06/21/2024 10:20 AM

## 2024-06-22 ENCOUNTER — Ambulatory Visit
Admission: RE | Admit: 2024-06-22 | Discharge: 2024-06-22 | Disposition: A | Source: Ambulatory Visit | Attending: Radiation Oncology | Admitting: Radiation Oncology

## 2024-06-22 ENCOUNTER — Inpatient Hospital Stay

## 2024-06-22 ENCOUNTER — Other Ambulatory Visit: Payer: Self-pay

## 2024-06-22 ENCOUNTER — Encounter: Payer: Self-pay | Admitting: Oncology

## 2024-06-22 VITALS — BP 148/95 | HR 88 | Temp 98.3°F | Resp 18

## 2024-06-22 DIAGNOSIS — Z510 Encounter for antineoplastic radiation therapy: Secondary | ICD-10-CM | POA: Diagnosis not present

## 2024-06-22 DIAGNOSIS — C119 Malignant neoplasm of nasopharynx, unspecified: Secondary | ICD-10-CM

## 2024-06-22 LAB — RAD ONC ARIA SESSION SUMMARY
Course Elapsed Days: 9
Plan Fractions Treated to Date: 8
Plan Prescribed Dose Per Fraction: 2 Gy
Plan Total Fractions Prescribed: 35
Plan Total Prescribed Dose: 70 Gy
Reference Point Dosage Given to Date: 16 Gy
Reference Point Session Dosage Given: 2 Gy
Session Number: 8

## 2024-06-22 MED ORDER — SODIUM CHLORIDE 0.9 % IV SOLN
40.0000 mg/m2 | Freq: Once | INTRAVENOUS | Status: AC
  Administered 2024-06-22: 84 mg via INTRAVENOUS
  Filled 2024-06-22: qty 84

## 2024-06-22 MED ORDER — LORAZEPAM 2 MG/ML IJ SOLN
1.0000 mg | Freq: Once | INTRAMUSCULAR | Status: AC
  Administered 2024-06-22: 1 mg via INTRAVENOUS
  Filled 2024-06-22: qty 1

## 2024-06-22 MED ORDER — SODIUM CHLORIDE 0.9 % IV SOLN
INTRAVENOUS | Status: DC
  Filled 2024-06-22: qty 250

## 2024-06-22 MED ORDER — DEXAMETHASONE SOD PHOSPHATE PF 10 MG/ML IJ SOLN
10.0000 mg | Freq: Once | INTRAMUSCULAR | Status: AC
  Administered 2024-06-22: 10 mg via INTRAVENOUS

## 2024-06-22 MED ORDER — APREPITANT 130 MG/18ML IV EMUL
130.0000 mg | Freq: Once | INTRAVENOUS | Status: AC
  Administered 2024-06-22: 130 mg via INTRAVENOUS
  Filled 2024-06-22: qty 18

## 2024-06-22 MED ORDER — MAGNESIUM SULFATE 2 GM/50ML IV SOLN
2.0000 g | Freq: Once | INTRAVENOUS | Status: AC
  Administered 2024-06-22: 2 g via INTRAVENOUS
  Filled 2024-06-22: qty 50

## 2024-06-22 MED ORDER — PALONOSETRON HCL INJECTION 0.25 MG/5ML
0.2500 mg | Freq: Once | INTRAVENOUS | Status: AC
  Administered 2024-06-22: 0.25 mg via INTRAVENOUS
  Filled 2024-06-22: qty 5

## 2024-06-22 MED ORDER — POTASSIUM CHLORIDE IN NACL 20-0.9 MEQ/L-% IV SOLN
Freq: Once | INTRAVENOUS | Status: AC
  Filled 2024-06-22: qty 1000

## 2024-06-22 NOTE — Progress Notes (Signed)
 Pt reports that for 3 days after last treatment (starting the day after) she developed an fever, pt reports that she does not have a thermometer to check but it was very obvious that she had a fever. Thermometer provided for patient to have at home to monitor temperatures. Per Dr. Jacobo pt to take Tylenol  as directed on the bottle as needed and if Fevers persist despite Tylenol , to call clinic.

## 2024-06-22 NOTE — Progress Notes (Signed)
 [facility] CSW Progress Note  Visual merchandiser met with patient to follow-up on need for community resources.    Interventions: CSW provided patient with name and number for her Medicaid transportation benefit.  She acknowledged the information.      Follow Up Plan:  CSW will follow-up with patient by phone     Brandi [name] Au, LCSW Clinical Social Worker [facility]    Patient is participating in a Managed Medicaid Plan:  Yes

## 2024-06-23 ENCOUNTER — Ambulatory Visit
Admission: RE | Admit: 2024-06-23 | Discharge: 2024-06-23 | Disposition: A | Discharge status: Home / Self Care | Source: Ambulatory Visit | Attending: Radiation Oncology | Admitting: Radiation Oncology

## 2024-06-23 ENCOUNTER — Other Ambulatory Visit: Payer: Self-pay

## 2024-06-23 DIAGNOSIS — Z510 Encounter for antineoplastic radiation therapy: Secondary | ICD-10-CM | POA: Diagnosis not present

## 2024-06-23 LAB — RAD ONC ARIA SESSION SUMMARY
Course Elapsed Days: 10
Plan Fractions Treated to Date: 9
Plan Prescribed Dose Per Fraction: 2 Gy
Plan Total Fractions Prescribed: 35
Plan Total Prescribed Dose: 70 Gy
Reference Point Dosage Given to Date: 18 Gy
Reference Point Session Dosage Given: 2 Gy
Session Number: 9

## 2024-06-25 ENCOUNTER — Encounter: Payer: Self-pay | Admitting: Oncology

## 2024-06-26 ENCOUNTER — Encounter: Payer: Self-pay | Admitting: Internal Medicine

## 2024-06-26 ENCOUNTER — Other Ambulatory Visit: Payer: Self-pay

## 2024-06-26 ENCOUNTER — Observation Stay
Admission: EM | Admit: 2024-06-26 | Discharge: 2024-06-27 | Disposition: A | Discharge status: Home / Self Care | Attending: Hospitalist | Admitting: Hospitalist

## 2024-06-26 ENCOUNTER — Ambulatory Visit

## 2024-06-26 DIAGNOSIS — R630 Anorexia: Secondary | ICD-10-CM | POA: Diagnosis not present

## 2024-06-26 DIAGNOSIS — R1312 Dysphagia, oropharyngeal phase: Secondary | ICD-10-CM

## 2024-06-26 DIAGNOSIS — J45909 Unspecified asthma, uncomplicated: Secondary | ICD-10-CM | POA: Insufficient documentation

## 2024-06-26 DIAGNOSIS — F1290 Cannabis use, unspecified, uncomplicated: Secondary | ICD-10-CM | POA: Insufficient documentation

## 2024-06-26 DIAGNOSIS — R1310 Dysphagia, unspecified: Principal | ICD-10-CM | POA: Insufficient documentation

## 2024-06-26 DIAGNOSIS — C119 Malignant neoplasm of nasopharynx, unspecified: Secondary | ICD-10-CM | POA: Diagnosis not present

## 2024-06-26 DIAGNOSIS — I10 Essential (primary) hypertension: Secondary | ICD-10-CM | POA: Diagnosis not present

## 2024-06-26 DIAGNOSIS — D490 Neoplasm of unspecified behavior of digestive system: Secondary | ICD-10-CM | POA: Insufficient documentation

## 2024-06-26 DIAGNOSIS — E876 Hypokalemia: Secondary | ICD-10-CM | POA: Diagnosis not present

## 2024-06-26 DIAGNOSIS — Z6838 Body mass index (BMI) 38.0-38.9, adult: Secondary | ICD-10-CM | POA: Insufficient documentation

## 2024-06-26 DIAGNOSIS — R197 Diarrhea, unspecified: Secondary | ICD-10-CM | POA: Insufficient documentation

## 2024-06-26 DIAGNOSIS — R070 Pain in throat: Secondary | ICD-10-CM | POA: Diagnosis present

## 2024-06-26 DIAGNOSIS — E860 Dehydration: Principal | ICD-10-CM

## 2024-06-26 DIAGNOSIS — R110 Nausea: Secondary | ICD-10-CM | POA: Diagnosis not present

## 2024-06-26 DIAGNOSIS — R638 Other symptoms and signs concerning food and fluid intake: Secondary | ICD-10-CM

## 2024-06-26 LAB — COMPREHENSIVE METABOLIC PANEL WITH GFR
ALT: 47 U/L — ABNORMAL HIGH (ref 0–44)
AST: 39 U/L (ref 15–41)
Albumin: 3.7 g/dL (ref 3.5–5.0)
Alkaline Phosphatase: 56 U/L (ref 38–126)
Anion gap: 13 (ref 5–15)
BUN: 12 mg/dL (ref 6–20)
CO2: 22 mmol/L (ref 22–32)
Calcium: 9.4 mg/dL (ref 8.9–10.3)
Chloride: 96 mmol/L — ABNORMAL LOW (ref 98–111)
Creatinine, Ser: 0.77 mg/dL (ref 0.44–1.00)
GFR, Estimated: >60 mL/min (ref >60)
Glucose, Bld: 103 mg/dL — ABNORMAL HIGH (ref 70–99)
Potassium: 3.3 mmol/L — ABNORMAL LOW (ref 3.5–5.1)
Sodium: 131 mmol/L — ABNORMAL LOW (ref 135–145)
Total Bilirubin: 1 mg/dL (ref 0.0–1.2)
Total Protein: 8.3 g/dL — ABNORMAL HIGH (ref 6.5–8.1)

## 2024-06-26 LAB — CBC
HCT: 35.3 % — ABNORMAL LOW (ref 36.0–46.0)
Hemoglobin: 11.8 g/dL — ABNORMAL LOW (ref 12.0–15.0)
MCH: 29.6 pg (ref 26.0–34.0)
MCHC: 33.4 g/dL (ref 30.0–36.0)
MCV: 88.5 fL (ref 80.0–100.0)
Platelets: 510 K/uL — ABNORMAL HIGH (ref 150–400)
RBC: 3.99 MIL/uL (ref 3.87–5.11)
RDW: 13.1 % (ref 11.5–15.5)
WBC: 3.9 K/uL — ABNORMAL LOW (ref 4.0–10.5)
nRBC: 0 % (ref 0.0–0.2)

## 2024-06-26 LAB — URINALYSIS, ROUTINE W REFLEX MICROSCOPIC
Bilirubin Urine: NEGATIVE
Glucose, UA: NEGATIVE mg/dL
Ketones, ur: 20 mg/dL — AB
Leukocytes,Ua: NEGATIVE
Nitrite: NEGATIVE
Protein, ur: 30 mg/dL — AB
Specific Gravity, Urine: 1.02 (ref 1.005–1.030)
pH: 7 (ref 5.0–8.0)

## 2024-06-26 LAB — LIPASE, BLOOD: Lipase: 28 U/L (ref 11–51)

## 2024-06-26 LAB — MAGNESIUM: Magnesium: 1.9 mg/dL (ref 1.7–2.4)

## 2024-06-26 MED ORDER — PHENOL 1.4 % MT LIQD: 1.0000 | OROMUCOSAL | Status: DC | PRN

## 2024-06-26 MED ORDER — PANTOPRAZOLE SODIUM 40 MG PO TBEC
40.0000 mg | DELAYED_RELEASE_TABLET | Freq: Every day | ORAL | Status: DC
  Administered 2024-06-26 – 2024-06-27 (×2): 40 mg via ORAL
  Filled 2024-06-26 (×2): qty 1

## 2024-06-26 MED ORDER — POTASSIUM CHLORIDE IN NACL 40-0.9 MEQ/L-% IV SOLN
INTRAVENOUS | Status: AC
  Filled 2024-06-26 (×4): qty 1000

## 2024-06-26 MED ORDER — ACETAMINOPHEN 325 MG PO TABS: 650.0000 mg | ORAL_TABLET | Freq: Four times a day (QID) | ORAL | Status: DC | PRN

## 2024-06-26 MED ORDER — LIDOCAINE VISCOUS HCL 2 % MT SOLN: 15.0000 mL | OROMUCOSAL | Status: DC | PRN

## 2024-06-26 MED ORDER — SENNOSIDES-DOCUSATE SODIUM 8.6-50 MG PO TABS: 1.0000 | ORAL_TABLET | Freq: Two times a day (BID) | ORAL | Status: DC

## 2024-06-26 MED ORDER — ONDANSETRON HCL 4 MG/2ML IJ SOLN
4.0000 mg | Freq: Once | INTRAMUSCULAR | Status: AC
  Administered 2024-06-26: 4 mg via INTRAVENOUS
  Filled 2024-06-26: qty 2

## 2024-06-26 MED ORDER — ONDANSETRON HCL 4 MG/2ML IJ SOLN: 4.0000 mg | Freq: Four times a day (QID) | INTRAMUSCULAR | Status: DC | PRN

## 2024-06-26 MED ORDER — ONDANSETRON HCL 4 MG PO TABS
4.0000 mg | ORAL_TABLET | Freq: Four times a day (QID) | ORAL | Status: DC | PRN
  Administered 2024-06-26: 4 mg via ORAL
  Filled 2024-06-26: qty 1

## 2024-06-26 MED ORDER — SODIUM CHLORIDE 0.9 % IV SOLN: INTRAVENOUS | Status: DC

## 2024-06-26 MED ORDER — CYCLOBENZAPRINE HCL 10 MG PO TABS
10.0000 mg | ORAL_TABLET | Freq: Three times a day (TID) | ORAL | Status: DC | PRN
Start: 2024-06-26 — End: 2024-06-27

## 2024-06-26 MED ORDER — SUCRALFATE 1 GM/10ML PO SUSP
1.0000 g | Freq: Three times a day (TID) | ORAL | Status: DC
  Administered 2024-06-26 (×2): 1 g via ORAL
  Filled 2024-06-26 (×3): qty 10

## 2024-06-26 MED ORDER — HYDROCODONE-ACETAMINOPHEN 5-325 MG PO TABS: 1.0000 | ORAL_TABLET | Freq: Four times a day (QID) | ORAL | Status: DC | PRN

## 2024-06-26 MED ORDER — DEXAMETHASONE SOD PHOSPHATE PF 10 MG/ML IJ SOLN
10.0000 mg | Freq: Once | INTRAMUSCULAR | Status: AC
  Administered 2024-06-26: 10 mg via INTRAVENOUS

## 2024-06-26 MED ORDER — DEXAMETHASONE SODIUM PHOSPHATE 4 MG/ML IJ SOLN
4.0000 mg | Freq: Four times a day (QID) | INTRAMUSCULAR | Status: DC
  Administered 2024-06-26 – 2024-06-27 (×3): 4 mg via INTRAVENOUS
  Filled 2024-06-26 (×3): qty 1

## 2024-06-26 MED ORDER — DROSPIRENONE 4 MG PO TABS: 1.0000 | ORAL_TABLET | Freq: Every day | ORAL | Status: DC

## 2024-06-26 MED ORDER — HYDRALAZINE HCL 20 MG/ML IJ SOLN: 5.0000 mg | Freq: Four times a day (QID) | INTRAMUSCULAR | Status: DC | PRN

## 2024-06-26 MED ORDER — SODIUM CHLORIDE 0.9 % IV BOLUS
1000.0000 mL | Freq: Once | INTRAVENOUS | Status: AC
Start: 2024-06-26 — End: 2024-06-27
  Administered 2024-06-26: 1000 mL via INTRAVENOUS

## 2024-06-26 MED ORDER — ALPRAZOLAM 0.25 MG PO TABS: 0.2500 mg | ORAL_TABLET | Freq: Every evening | ORAL | Status: DC | PRN

## 2024-06-26 MED ORDER — ENSURE PLUS HIGH PROTEIN PO LIQD
237.0000 mL | Freq: Three times a day (TID) | ORAL | Status: DC
Start: 2024-06-26 — End: 2024-06-27

## 2024-06-26 NOTE — ED Triage Notes (Signed)
 Patient ambulatory to triage with complaints of worsening throat pain. Patient has hx of throat cancer, received radiation this past Friday. Since then endorses inability to swallow foods. Has not eaten in 3 days and states vomiting/diarrhea.

## 2024-06-26 NOTE — ED Provider Notes (Signed)
 [facility] Provider Note    Event Date/Time   First MD Initiated Contact with Patient 06/26/24 0701     (approximate)   History   Sore Throat   HPI  Brandi [name] is a 28 y.o. female who is stage III nasopharyngeal cancer carcinoma, on chemo, presenting with throat pain and inability to tolerate p.o.  States that she has been having vomiting and diarrhea, has not been able to eat for 3 days.  States over the last several days, because of the radiation, she states that only able to drink some water and ice, did try some applesauce but will feel very nauseous, states that unless what she eats is smooth and pured, able to feel like it would take some time to get down her throat and she would feel nauseous.  Has been feeling weak, no chest pain or shortness of breath, states that her upper abdomen feels tight but no pain.  No urinary symptoms or fever.  On independent chart review, she was seen by oncology on 22 October, she was seen there for consideration of cycle 2 of weekly cisplatin.  Slightly anxious,, noted increased fatigue and poor appetite.  Also nausea.     Physical Exam   Triage Vital Signs: ED Triage Vitals  Encounter Vitals Group     BP 06/26/24 0646 (!) 149/109     Girls Systolic BP Percentile --      Girls Diastolic BP Percentile --      Boys Systolic BP Percentile --      Boys Diastolic BP Percentile --      Pulse Rate 06/26/24 0646 (!) 104     Resp 06/26/24 0646 18     Temp 06/26/24 0646 98.5 F (36.9 C)     Temp src --      SpO2 06/26/24 0646 100 %     Weight 06/26/24 0647 209 lb 7 oz (95 kg)     Height 06/26/24 0647 5' 2 (1.575 m)     Head Circumference --      Peak Flow --      Pain Score 06/26/24 0647 0     Pain Loc --      Pain Education --      Exclude from Growth Chart --     Most recent vital signs: Vitals:   06/26/24 0646 06/26/24 0820  BP: (!) 149/109 (!) 142/101  Pulse: (!) 104 84  Resp: 18 18  Temp: 98.5 F  (36.9 C)   SpO2: 100% 100%     General: Awake, no distress.  CV:  Good peripheral perfusion.  Resp:  Normal effort.  Abd:  No distention.  Soft nontender Other:  Dry mucous membranes, oropharynx without obvious swelling, she is maintaining her secretions, not drooling, no voice change, no trismus   ED Results / Procedures / Treatments   Labs (all labs ordered are listed, but only abnormal results are displayed) Labs Reviewed  COMPREHENSIVE METABOLIC PANEL WITH GFR - Abnormal; Notable for the following components:      Result Value   Sodium 131 (*)    Potassium 3.3 (*)    Chloride 96 (*)    Glucose, Bld 103 (*)    Total Protein 8.3 (*)    ALT 47 (*)    All other components within normal limits  CBC - Abnormal; Notable for the following components:   WBC 3.9 (*)    Hemoglobin 11.8 (*)    HCT 35.3 (*)  Platelets 510 (*)    All other components within normal limits  LIPASE, BLOOD  URINALYSIS, ROUTINE W REFLEX MICROSCOPIC  POC URINE PREG, ED      PROCEDURES:  Critical Care performed: No  Procedures   MEDICATIONS ORDERED IN ED: Medications  sodium chloride  0.9 % bolus 1,000 mL (1,000 mLs Intravenous New Bag/Given 06/26/24 0736)  ondansetron  (ZOFRAN ) injection 4 mg (4 mg Intravenous Given 06/26/24 0736)  dexamethasone  (DECADRON ) injection 10 mg (10 mg Intravenous Given 06/26/24 0820)     IMPRESSION / MDM / ASSESSMENT AND PLAN / ED COURSE  I reviewed the triage vital signs and the nursing notes.                              Differential diagnosis includes, but is not limited to, dehydration, electrolyte derangements, radiation side effects for her cancer.  Did also consider viral illness, gastroenteritis, no abdominal pain at this time to suggest colitis, diverticulitis.  No urinary symptoms.  Will get labs, will give her some IV fluids, IV Zofran .  Will also give her a dose of IV Decadron  here.  Patient's presentation is most consistent with acute presentation  with potential threat to life or bodily function.  Independent interpretation of labs below.  Given her decreased p.o. intake, dehydration, inability to tolerate more than half a spoonful of applesauce, patient needs to be admitted for further management and rehydration.  Consulted hospitalist who will see the patient.  She is admitted.    Clinical Course as of 06/26/24 0843  Mon Jun 26, 2024  0822 Independent review of labs, lipase is not elevated, creatinine is normal, ALT is mildly elevated but she has no abdominal pain or jaundice at this time, mild hyponatremia, mild hypokalemia, she is getting fluids for dehydration.  Mild leukopenia also noted suspect this could be due to her chemo. [TT]  0830 Went to reassess patient, she states that she will try the applesauce and see if she is able to tolerate that.  Patient was able to get a small amount of applesauce down but states that she feels that it is the back of her throat and causes her to feel quite nauseous.  Unable to tolerate more than 1 small spoonful of applesauce at this time. [TT]    Clinical Course User Index [TT] Waymond, Lorelle Cummins, MD     FINAL CLINICAL IMPRESSION(S) / ED DIAGNOSES   Final diagnoses:  Decreased oral intake  Dehydration  Nasopharyngeal cancer (HCC)  Nausea  Diarrhea, unspecified type     Rx / DC Orders   ED Discharge Orders     None        Note:  This document was prepared using Dragon voice recognition software and may include unintentional dictation errors.    Waymond Lorelle Cummins, MD 06/26/24 [phone]

## 2024-06-26 NOTE — Progress Notes (Signed)
Collected urine and sent to lab.

## 2024-06-26 NOTE — Consult Note (Signed)
 [facility]  [phone] [phone]  ID: Brandi [name] OB: 1996-08-24  MR#: [id number]  RDW#:[id number]  Patient Care Team: [name], MD as PCP - General (Internal Medicine) [name], MD as Consulting Physician (Radiation Oncology)  CHIEF COMPLAINT: Stage III EBV positive nasopharyngeal carcinoma.  INTERVAL HISTORY: Patient is a 28 year old female actively receiving weekly chemotherapy with cisplatin and daily XRT for the above-stated malignancy.  Over the past several days she has had increased nausea, diarrhea, and poor p.o. intake.  She also continues to be anxious.  She has no neurologic complaints.  She denies any recent fevers.  She has no chest pain, shortness of breath, cough, or hemoptysis.  She is increased dysphagia, but denies any other pain.  She has no urinary complaints.  Patient offers no further specific complaints today.  REVIEW OF SYSTEMS:   Review of Systems  Constitutional:  Positive for malaise/fatigue. Negative for fever and weight loss.  HENT:  Positive for sore throat.   Respiratory: Negative.  Negative for cough, hemoptysis and shortness of breath.   Cardiovascular: Negative.  Negative for chest pain and leg swelling.  Gastrointestinal:  Positive for diarrhea, nausea and vomiting. Negative for abdominal pain.  Genitourinary: Negative.  Negative for dysuria.  Musculoskeletal: Negative.  Negative for back pain.  Skin: Negative.  Negative for rash.  Neurological:  Positive for weakness. Negative for dizziness, focal weakness and headaches.  Psychiatric/Behavioral:  The patient is nervous/anxious.     As per HPI. Otherwise, a complete review of systems is negative.  PAST MEDICAL HISTORY: Past Medical History:  Diagnosis Date   Asthma    Cancer (HCC)    GERD (gastroesophageal reflux disease)    HTN (hypertension)     PAST SURGICAL HISTORY: Past Surgical History:  Procedure Laterality Date   dental  procedure     IR US  LIVER BIOPSY  05/17/2024    FAMILY HISTORY: Family History  Problem Relation Age of Onset   Hypertension Maternal Grandmother    Diabetes Maternal Grandmother     ADVANCED DIRECTIVES (Y/N):  @ADVDIR @  HEALTH MAINTENANCE: Social History   Tobacco Use   Smoking status: Former    Types: E-cigarettes   Smokeless tobacco: Never  Vaping Use   Vaping status: Former  Substance Use Topics   Alcohol use: Not Currently    Comment: social   Drug use: Yes    Types: Marijuana     Colonoscopy:  PAP:  Bone density:  Lipid panel:  Allergies  Allergen Reactions   Shellfish Allergy Anaphylaxis   Tomato Other (See Comments)    Flares up exema    Current Facility-Administered Medications  Medication Dose Route Frequency Provider Last Rate Last Admin   0.9 % NaCl with KCl 40 mEq / L  infusion   Intravenous Continuous Clair Marolyn NOVAK, RPH 125 mL/hr at 06/26/24 1019 New Bag at 06/26/24 1019   acetaminophen  (TYLENOL ) tablet 650 mg  650 mg Oral Q6H PRN Laurita Manor T, MD       ALPRAZolam  (XANAX ) tablet 0.25 mg  0.25 mg Oral QHS PRN Laurita Manor T, MD       cyclobenzaprine  (FLEXERIL ) tablet 10 mg  10 mg Oral TID PRN Laurita Manor DASEN, MD       dexamethasone  (DECADRON ) injection 4 mg  4 mg Intravenous Q6H Laurita Manor T, MD   4 mg at 06/26/24 1350   Drospirenone  TABS 4 mg  1 tablet Oral Daily Laurita Manor DASEN, MD  feeding supplement (ENSURE PLUS HIGH PROTEIN) liquid 237 mL  237 mL Oral TID BM Zhang, Ping T, MD       hydrALAZINE  (APRESOLINE ) injection 5 mg  5 mg Intravenous Q6H PRN Laurita Cort DASEN, MD       HYDROcodone -acetaminophen  (NORCO/VICODIN) 5-325 MG per tablet 1 tablet  1 tablet Oral Q6H PRN Laurita Cort DASEN, MD       lidocaine  (XYLOCAINE ) 2 % viscous mouth solution 15 mL  15 mL Mouth/Throat Q3H PRN Laurita Cort T, MD       ondansetron  (ZOFRAN ) tablet 4 mg  4 mg Oral Q6H PRN Laurita Cort T, MD       Or   ondansetron  (ZOFRAN ) injection 4 mg  4 mg Intravenous Q6H PRN Laurita Cort T, MD       pantoprazole  (PROTONIX ) EC tablet 40 mg  40 mg Oral Daily Laurita Cort T, MD   40 mg at 06/26/24 1018   phenol (CHLORASEPTIC) mouth spray 1 spray  1 spray Mouth/Throat PRN Laurita Cort DASEN, MD       senna-docusate (Senokot-S) tablet 1 tablet  1 tablet Oral BID Laurita Cort T, MD       sucralfate  (CARAFATE ) 1 GM/10ML suspension 1 g  1 g Oral TID WC & HS Laurita Cort T, MD   1 g at 06/26/24 1200    OBJECTIVE: Vitals:   06/26/24 0830 06/26/24 0909  BP: (!) 143/104 (!) 144/105  Pulse: 88 84  Resp: 18 17  Temp:  97.7 F (36.5 C)  SpO2: 100% 99%     Body mass index is 38.31 kg/m.    ECOG FS:1 - Symptomatic but completely ambulatory  General: Well-developed, well-nourished, no acute distress. Eyes: Pink conjunctiva, anicteric sclera. HEENT: Normocephalic, moist mucous membranes. Lungs: No audible wheezing or coughing. Heart: Regular rate and rhythm. Abdomen: Soft, nontender, no obvious distention. Musculoskeletal: No edema, cyanosis, or clubbing. Neuro: Alert, answering all questions appropriately. Cranial nerves grossly intact. Skin: No rashes or petechiae noted. Psych: Normal affect.  LAB RESULTS:  Lab Results  Component Value Date   NA 131 (L) 06/26/2024   K 3.3 (L) 06/26/2024   CL 96 (L) 06/26/2024   CO2 22 06/26/2024   GLUCOSE 103 (H) 06/26/2024   BUN 12 06/26/2024   CREATININE 0.77 06/26/2024   CALCIUM 9.4 06/26/2024   PROT 8.3 (H) 06/26/2024   ALBUMIN 3.7 06/26/2024   AST 39 06/26/2024   ALT 47 (H) 06/26/2024   ALKPHOS 56 06/26/2024   BILITOT 1.0 06/26/2024   GFRNONAA >60 06/26/2024   GFRAA >60 12/05/2019    Lab Results  Component Value Date   WBC 3.9 (L) 06/26/2024   NEUTROABS 9.3 (H) 06/21/2024   HGB 11.8 (L) 06/26/2024   HCT 35.3 (L) 06/26/2024   MCV 88.5 06/26/2024   PLT 510 (H) 06/26/2024     STUDIES: CT ABDOMEN PELVIS W CONTRAST Result Date: 06/18/2024 EXAM: CT ABDOMEN AND PELVIS WITH CONTRAST 06/18/2024 12:25:32 PM TECHNIQUE: CT of  the abdomen and pelvis was performed with the administration of 100 mL of iohexol  (OMNIPAQUE ) 300 MG/ML solution. Multiplanar reformatted images are provided for review. Automated exposure control, iterative reconstruction, and/or weight-based adjustment of the mA/kV was utilized to reduce the radiation dose to as low as reasonably achievable. COMPARISON: PET CT fusion study dated 05/26/2024 and CT of the abdomen and pelvis dated 04/05/2022. CLINICAL HISTORY: left lower back/abdominal pain. Pt comes with c/o back pain and some lower belly pain. Pt states she had  1st round of chemo on Wed and radiation everyday except the weekend. Pt states since that her back has been hurting. Pt states she thinks they didn't flush her out good enough. Pt states she only got two bags of fluids and it was suppose to be 4. FINDINGS: LOWER CHEST: No acute abnormality. LIVER: The liver is unremarkable. GALLBLADDER AND BILE DUCTS: Gallbladder is unremarkable. No biliary ductal dilatation. SPLEEN: No acute abnormality. PANCREAS: No acute abnormality. ADRENAL GLANDS: No acute abnormality. KIDNEYS, URETERS AND BLADDER: No stones in the kidneys or ureters. No hydronephrosis. No perinephric or periureteral stranding. Urinary bladder is unremarkable. GI AND BOWEL: There are few scattered colonic diverticula. Stomach demonstrates no acute abnormality. There is no bowel obstruction. PERITONEUM AND RETROPERITONEUM: No ascites. No free air. VASCULATURE: Aorta is normal in caliber. LYMPH NODES: No lymphadenopathy. REPRODUCTIVE ORGANS: No acute abnormality. BONES AND SOFT TISSUES: No acute osseous abnormality. No focal soft tissue abnormality. IMPRESSION: 1. No acute findings in the abdomen or pelvis. 2. Scattered colonic diverticula without evidence of diverticulitis. Electronically signed by: Evalene Coho MD 06/18/2024 12:55 PM EDT RP Workstation: [id number]    ASSESSMENT: Stage III EBV positive nasopharyngeal carcinoma.   PLAN:     Stage III EBV positive nasopharyngeal carcinoma: PET scan results from May 26, 2024 reviewed independently confirming stage of disease.  Patient continues to receive daily XRT.  She last received cisplatin less than 1 week ago.  She has been instructed to keep her previously scheduled follow-up appointment on Thursday, June 29, 2024 for further evaluation and continuation of treatment.  We briefly discussed delaying treatment this week, but will make final decision clinic appointment this week.   Dehydration: Continue IV fluids as ordered.   Dysphagia: Discussed lidocaine  as needed. Anxiety: Patient was previously instructed to increase her Xanax  to twice per day. Nausea: Continue Zofran  and Compazine as prescribed. Diarrhea: Improved. Hyponatremia: Mild, monitor.  Patient sodium levels 131. Hypokalemia: Patient's most recent potassium is 3.3.  Replace electrolytes as needed. Leukopenia: Mild.  Likely secondary to chemotherapy. Anemia: Hemoglobin mildly improved to 11.8. Thrombocytosis: Likely reactive.  Appreciate consult, will follow.  Evalene JINNY Reusing, MD   06/26/2024 2:19 PM

## 2024-06-26 NOTE — H&P (Signed)
 History and Physical    Brandi [name] FMW:[id number] DOB: October 28, 1995 DOA: 06/26/2024  PCP: [name], MD (Confirm with patient/family/NH records and if not entered, this has to be entered at [facility] point of entry) Patient coming from: Home  I have personally briefly reviewed patient's old medical records in [facility]  Chief Complaint: Throat pain, difficult swallowing  HPI: Brandi [name] is a 28 y.o. 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  Patient was diagnosed with stage III EBV positive nasopharyngeal carcinoma in September of this year, has started on chemo and radiation therapy.  Patient reported that radiation therapy made her throat burning sensation and trouble to swallow solid food even liquids will makes burning sensation worse.  She has not been able to eat or drink much for the last few days and became dehydrated.  Patient denies any abdominal pain, no cough no diarrhea.   ED Course: Afebrile, borderline tachycardia blood pressure 140/100 O2 saturation 90% on room air.  Blood work showed K3.3 hemoglobin 11.8 WBC 3.9.  Patient was given dexamethasone  x 1, IV bolus, Zofran  in the ED.  Review of Systems: As per HPI otherwise 14 point review of systems negative.    Past Medical History:  Diagnosis Date   Asthma    Cancer (HCC)    GERD (gastroesophageal reflux disease)    HTN (hypertension)     Past Surgical History:  Procedure Laterality Date   dental procedure     IR US  LIVER BIOPSY  05/17/2024     reports that she has quit smoking. Her smoking use included e-cigarettes. She has never used smokeless tobacco. She reports that she does not currently use alcohol. She reports current drug use. Drug: Marijuana.  Allergies  Allergen Reactions   Shellfish Allergy Anaphylaxis   Tomato Other (See  Comments)    Flares up exema    Family History  Problem Relation Age of Onset   Hypertension Maternal Grandmother    Diabetes Maternal Grandmother      Prior to Admission medications   Medication Sig Start Date End Date Taking? Authorizing Provider  dexamethasone  (DECADRON ) 1 MG tablet Take by mouth. 04/22/24  Yes [provider]  gentamicin ointment (GARAMYCIN) 0.1 % Apply 1 Application topically 3 (three) times daily. 05/22/24  Yes [provider]  acetaminophen  (TYLENOL ) 325 MG tablet Take 2 tablets (650 mg total) by mouth every 6 (six) hours as needed for mild pain (pain score 1-3) or fever (or Fever >/= 101). 05/19/24   Fausto Burnard LABOR, DO  ALPRAZolam  (XANAX ) 0.25 MG tablet Take 1 tablet (0.25 mg total) by mouth at bedtime as needed for anxiety. 06/15/24   [name], MD  cyclobenzaprine  (FLEXERIL ) 10 MG tablet Take 1 tablet (10 mg total) by mouth 3 (three) times daily as needed for muscle spasms. 06/14/24   [name], MD  HYDROcodone -acetaminophen  (NORCO/VICODIN) 5-325 MG tablet Take 1 tablet by mouth every 6 (six) hours as needed for moderate pain (pain score 4-6). 06/18/24   Floy Roberts, MD  ondansetron  (ZOFRAN ) 8 MG tablet Take 1 tablet (8 mg total) by mouth every 8 (eight) hours as needed for nausea or vomiting. Start on the third day after cisplatin. 05/30/24   [name], MD  oxyCODONE -acetaminophen  (PERCOCET/ROXICET) 5-325 MG tablet Take by mouth. 05/26/24   [provider]  pantoprazole  (PROTONIX ) 40 MG  tablet Take 1 tablet (40 mg total) by mouth daily. 04/18/24 04/18/25  Bradler, Evan K, MD  predniSONE  (STERAPRED UNI-PAK 21 TAB) 10 MG (21) TBPK tablet Take as directed over 6 days. Follow the dosing schedule on the blister pack. Patient not taking: Reported on 06/14/2024 05/19/24   Fausto Sor A, DO  prochlorperazine (COMPAZINE) 10 MG tablet Take 1 tablet (10 mg total) by mouth every 6 (six) hours as needed (Nausea or  vomiting). 05/30/24   [name], MD  promethazine  (PHENERGAN ) 12.5 MG tablet Take 1 tablet (12.5 mg total) by mouth every 6 (six) hours as needed for up to 5 days for nausea or vomiting. 02/29/24 06/14/24  Levander Slate, MD  senna-docusate (SENOKOT-S) 8.6-50 MG tablet Take 1 tablet by mouth 2 (two) times daily. 05/19/24   Fausto Sor A, DO  SLYND  4 MG TABS Take 1 tablet by mouth daily. 05/20/24   [provider]    Physical Exam: Vitals:   06/26/24 0646 06/26/24 0647 06/26/24 0820 06/26/24 0830  BP: (!) 149/109  (!) 142/101 (!) 143/104  Pulse: (!) 104  84 88  Resp: 18  18 18   Temp: 98.5 F (36.9 C)     SpO2: 100%  100% 100%  Weight:  95 kg    Height:  5' 2 (1.575 m)      Constitutional: NAD, calm, comfortable Vitals:   06/26/24 0646 06/26/24 0647 06/26/24 0820 06/26/24 0830  BP: (!) 149/109  (!) 142/101 (!) 143/104  Pulse: (!) 104  84 88  Resp: 18  18 18   Temp: 98.5 F (36.9 C)     SpO2: 100%  100% 100%  Weight:  95 kg    Height:  5' 2 (1.575 m)     Eyes: PERRL, lids and conjunctivae normal ENMT: Mucous membranes are dry. Posterior pharynx clear of any exudate or lesions.Normal dentition.  Neck: normal, supple, no masses, no thyromegaly Respiratory: clear to auscultation bilaterally, no wheezing, no crackles. Normal respiratory effort. No accessory muscle use.  Cardiovascular: Regular rate and rhythm, no murmurs / rubs / gallops. No extremity edema. 2+ pedal pulses. No carotid bruits.  Abdomen: no tenderness, no masses palpated. No hepatosplenomegaly. Bowel sounds positive.  Musculoskeletal: no clubbing / cyanosis. No joint deformity upper and lower extremities. Good ROM, no contractures. Normal muscle tone.  Skin: no rashes, lesions, ulcers. No induration Neurologic: CN 2-12 grossly intact. Sensation intact, DTR normal. Strength 5/5 in all 4.  Psychiatric: Normal judgment and insight. Alert and oriented x 3. Normal mood.     Labs on Admission: I have  personally reviewed following labs and imaging studies  CBC: Recent Labs  Lab 06/21/24 0911 06/26/24 0706  WBC 11.1* 3.9*  NEUTROABS 9.3*  --   HGB 11.1* 11.8*  HCT 32.8* 35.3*  MCV 87.2 88.5  PLT 581* 510*   Basic Metabolic Panel: Recent Labs  Lab 06/21/24 0911 06/26/24 0706  NA 132* 131*  K 3.6 3.3*  CL 98 96*  CO2 21* 22  GLUCOSE 121* 103*  BUN 10 12  CREATININE 0.80 0.77  CALCIUM 9.3 9.4  MG 2.3  --    GFR: Estimated Creatinine Clearance: 112.6 mL/min (by C-G formula based on SCr of 0.77 mg/dL). Liver Function Tests: Recent Labs  Lab 06/21/24 0911 06/26/24 0706  AST 18 39  ALT 20 47*  ALKPHOS 64 56  BILITOT 0.7 1.0  PROT 7.9 8.3*  ALBUMIN 3.5 3.7   Recent Labs  Lab 06/26/24 0706  LIPASE  28   No results for input(s): AMMONIA in the last 168 hours. Coagulation Profile: No results for input(s): INR, PROTIME in the last 168 hours. Cardiac Enzymes: No results for input(s): CKTOTAL, CKMB, CKMBINDEX, TROPONINI in the last 168 hours. BNP (last 3 results) No results for input(s): PROBNP in the last 8760 hours. HbA1C: No results for input(s): HGBA1C in the last 72 hours. CBG: No results for input(s): GLUCAP in the last 168 hours. Lipid Profile: No results for input(s): CHOL, HDL, LDLCALC, TRIG, CHOLHDL, LDLDIRECT in the last 72 hours. Thyroid  Function Tests: No results for input(s): TSH, T4TOTAL, FREET4, T3FREE, THYROIDAB in the last 72 hours. Anemia Panel: No results for input(s): VITAMINB12, FOLATE, FERRITIN, TIBC, IRON, RETICCTPCT in the last 72 hours. Urine analysis:    Component Value Date/Time   COLORURINE YELLOW (A) 06/18/2024 1030   APPEARANCEUR CLEAR (A) 06/18/2024 1030   LABSPEC 1.008 06/18/2024 1030   PHURINE 8.0 06/18/2024 1030   GLUCOSEU NEGATIVE 06/18/2024 1030   HGBUR LARGE (A) 06/18/2024 1030   BILIRUBINUR NEGATIVE 06/18/2024 1030   KETONESUR NEGATIVE 06/18/2024 1030   PROTEINUR  NEGATIVE 06/18/2024 1030   UROBILINOGEN 1.0 05/20/2015 1230   NITRITE NEGATIVE 06/18/2024 1030   LEUKOCYTESUR NEGATIVE 06/18/2024 1030    Radiological Exams on Admission: No results found.  EKG: None   Assessment/Plan Principal Problem:   Dysphagia Active Problems:   Throat pain   Nasopharyngeal neoplasm  (please populate well all problems here in Problem List. (For example, if patient is on BP meds at home and you resume or decide to hold them, it is a problem that needs to be her. Same for CAD, COPD, HLD and so on)  Acute dysphagia Acute throat pain Dehydration Stage III EBV positive nasopharyngeal carcinoma - Associated with chemo and radiation therapy - Symptomatic management, continue IV dexamethasone  - Viscous lidocaine , Chloraseptic spray - Narcotics as needed - IV hydration - Consult oncology.  Outpatient follow-up for ongoing chemo and radiation therapy  Hypokalemia - Secondary to poor oral intake - IVF plus KCl for hydration - Check magnesium  level  HTN - As needed hydralazine   Morbid obesity - BMI= 38 - Calorie control recommended  DVT prophylaxis: SCD Code Status: Full code Family Communication: None at bedside Disposition Plan: Expect less than 2 midnight hospital stay Consults called: Oncology Admission status: MedSurg observation   Cort ONEIDA Mana MD [facility] Pager [phone]  06/26/2024, 9:05 AM

## 2024-06-26 NOTE — Plan of Care (Signed)

## 2024-06-27 ENCOUNTER — Ambulatory Visit

## 2024-06-27 ENCOUNTER — Other Ambulatory Visit: Payer: Self-pay

## 2024-06-27 DIAGNOSIS — R1310 Dysphagia, unspecified: Secondary | ICD-10-CM | POA: Diagnosis not present

## 2024-06-27 LAB — BASIC METABOLIC PANEL WITH GFR
Anion gap: 11 (ref 5–15)
BUN: 11 mg/dL (ref 6–20)
CO2: 20 mmol/L — ABNORMAL LOW (ref 22–32)
Calcium: 9.1 mg/dL (ref 8.9–10.3)
Chloride: 101 mmol/L (ref 98–111)
Creatinine, Ser: 0.73 mg/dL (ref 0.44–1.00)
GFR, Estimated: >60 mL/min (ref >60)
Glucose, Bld: 109 mg/dL — ABNORMAL HIGH (ref 70–99)
Potassium: 4.7 mmol/L (ref 3.5–5.1)
Sodium: 132 mmol/L — ABNORMAL LOW (ref 135–145)

## 2024-06-27 MED ORDER — PHENOL 1.4 % MT LIQD: 1.0000 | OROMUCOSAL | Status: DC | PRN

## 2024-06-27 MED ORDER — LIDOCAINE VISCOUS HCL 2 % MT SOLN: 15.0000 mL | OROMUCOSAL | 1 refills | Status: DC | PRN

## 2024-06-27 NOTE — Discharge Summary (Addendum)
 Physician Discharge Summary   Brandi [name]  female DOB: April 04, 1996  FMW:[id number]  PCP: [name], MD  Admit date: 06/26/2024 Discharge date: 06/27/2024  Admitted From: home Disposition:  home CODE STATUS: Full code   Hospital Course:  For full details, please see H&P, progress notes, consult notes and ancillary notes.  Briefly,  Brandi [name] is a 28 y.o. 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   Patient was diagnosed with stage III EBV positive nasopharyngeal carcinoma in September of this year, has started on chemo and radiation therapy.  Patient reported that radiation therapy caused her throat to have burning sensation and trouble to swallow solid food even liquids will makes burning sensation worse.  She has not been able to eat or drink much for the last few days and felt dehydrated.    Acute throat pain Odynophagia  Stage III EBV positive nasopharyngeal carcinoma - Associated with chemo and radiation therapy --pt not taking steroid PTA. - Viscous lidocaine , Chloraseptic spray - Narcotics as needed --outpatient F/u with Dr. Jacobo and Dr. Camelia on 10/30   Hypokalemia - Secondary to poor oral intake - resolved with IVF plus KCl for hydration   HTN --not on antihypertensives PTA   Class 2 obesity - BMI= 38   Discharge Diagnoses:  Principal Problem:   Dysphagia Active Problems:   Throat pain   Nasopharyngeal neoplasm   Dehydration   30 Day Unplanned Readmission Risk Score    Flowsheet Row ED to Hosp-Admission (Discharged) from 05/16/2024 in [facility] (1A)  30 Day Unplanned Readmission Risk Score (%) 22.63 Filed at 05/19/2024 1200    This score is the patient's risk of an unplanned readmission within 30 days of being discharged (0 -100%). The  score is based on dignosis, age, lab data, medications, orders, and past utilization.   Low:  0-14.9   Medium: 15-21.9   High: 22-29.9   Extreme: 30 and above         Discharge Instructions:  Allergies as of 06/27/2024       Reactions   Shellfish Allergy Anaphylaxis   Tomato Other (See Comments)   Flares up exema        Medication List     STOP taking these medications    dexamethasone  1 MG tablet Commonly known as: DECADRON    oxyCODONE -acetaminophen  5-325 MG tablet Commonly known as: PERCOCET/ROXICET   predniSONE  10 MG (21) Tbpk tablet Commonly known as: STERAPRED UNI-PAK 21 TAB       TAKE these medications    acetaminophen  325 MG tablet Commonly known as: TYLENOL  Take 2 tablets (650 mg total) by mouth every 6 (six) hours as needed for mild pain (pain score 1-3) or fever (or Fever >/= 101).   ALPRAZolam  0.25 MG tablet Commonly known as: XANAX  Take 1 tablet (0.25 mg total) by mouth at bedtime as needed for anxiety.   cyclobenzaprine  10 MG tablet Commonly known as: FLEXERIL  Take 1 tablet (10 mg total) by mouth 3 (three) times daily as needed for muscle spasms.   gentamicin ointment 0.1 % Commonly known as: GARAMYCIN Apply 1 Application topically 3 (three) times daily.   HYDROcodone -acetaminophen  5-325 MG tablet Commonly known as: NORCO/VICODIN Take 1 tablet by mouth every 6 (six) hours as needed for moderate pain (pain score 4-6).   lidocaine  2 % solution Commonly known as: XYLOCAINE  Use  as directed 15 mLs in the mouth or throat every 3 (three) hours as needed for mouth pain. swish and swallow.   ondansetron  8 MG tablet Commonly known as: Zofran  Take 1 tablet (8 mg total) by mouth every 8 (eight) hours as needed for nausea or vomiting. Start on the third day after cisplatin.   pantoprazole  40 MG tablet Commonly known as: Protonix  Take 1 tablet (40 mg total) by mouth daily.   phenol 1.4 % Liqd Commonly known as: CHLORASEPTIC Use as directed 1  spray in the mouth or throat as needed for throat irritation / pain.   prochlorperazine 10 MG tablet Commonly known as: COMPAZINE Take 1 tablet (10 mg total) by mouth every 6 (six) hours as needed (Nausea or vomiting).   senna-docusate 8.6-50 MG tablet Commonly known as: Senokot-S Take 1 tablet by mouth 2 (two) times daily.   Slynd  4 MG Tabs Generic drug: Drospirenone  Take 1 tablet by mouth daily.         Follow-up Information     [name], MD Follow up.   Specialty: Internal Medicine Why: hospital follow up Contact information: [address] [facility] [address] [phone]                 Allergies  Allergen Reactions   Shellfish Allergy Anaphylaxis   Tomato Other (See Comments)    Flares up exema     The results of significant diagnostics from this hospitalization (including imaging, microbiology, ancillary and laboratory) are listed below for reference.   Consultations:   Procedures/Studies: CT ABDOMEN PELVIS W CONTRAST Result Date: 06/18/2024 EXAM: CT ABDOMEN AND PELVIS WITH CONTRAST 06/18/2024 12:25:32 PM TECHNIQUE: CT of the abdomen and pelvis was performed with the administration of 100 mL of iohexol  (OMNIPAQUE ) 300 MG/ML solution. Multiplanar reformatted images are provided for review. Automated exposure control, iterative reconstruction, and/or weight-based adjustment of the mA/kV was utilized to reduce the radiation dose to as low as reasonably achievable. COMPARISON: PET CT fusion study dated 05/26/2024 and CT of the abdomen and pelvis dated 04/05/2022. CLINICAL HISTORY: left lower back/abdominal pain. Pt comes with c/o back pain and some lower belly pain. Pt states she had 1st round of chemo on Wed and radiation everyday except the weekend. Pt states since that her back has been hurting. Pt states she thinks they didn't flush her out good enough. Pt states she only got two bags of fluids and it was suppose to be 4. FINDINGS: LOWER  CHEST: No acute abnormality. LIVER: The liver is unremarkable. GALLBLADDER AND BILE DUCTS: Gallbladder is unremarkable. No biliary ductal dilatation. SPLEEN: No acute abnormality. PANCREAS: No acute abnormality. ADRENAL GLANDS: No acute abnormality. KIDNEYS, URETERS AND BLADDER: No stones in the kidneys or ureters. No hydronephrosis. No perinephric or periureteral stranding. Urinary bladder is unremarkable. GI AND BOWEL: There are few scattered colonic diverticula. Stomach demonstrates no acute abnormality. There is no bowel obstruction. PERITONEUM AND RETROPERITONEUM: No ascites. No free air. VASCULATURE: Aorta is normal in caliber. LYMPH NODES: No lymphadenopathy. REPRODUCTIVE ORGANS: No acute abnormality. BONES AND SOFT TISSUES: No acute osseous abnormality. No focal soft tissue abnormality. IMPRESSION: 1. No acute findings in the abdomen or pelvis. 2. Scattered colonic diverticula without evidence of diverticulitis. Electronically signed by: Evalene Coho MD 06/18/2024 12:55 PM EDT RP Workstation: [id number]      Labs: BNP (last 3 results) No results for input(s): BNP in the last 8760 hours. Basic Metabolic Panel: Recent Labs  Lab 06/21/24 0911 06/26/24 0706 06/27/24 0417  NA 132* 131* 132*  K 3.6 3.3* 4.7  CL 98 96* 101  CO2 21* 22 20*  GLUCOSE 121* 103* 109*  BUN 10 12 11   CREATININE 0.80 0.77 0.73  CALCIUM 9.3 9.4 9.1  MG 2.3 1.9  --    Liver Function Tests: Recent Labs  Lab 06/21/24 0911 06/26/24 0706  AST 18 39  ALT 20 47*  ALKPHOS 64 56  BILITOT 0.7 1.0  PROT 7.9 8.3*  ALBUMIN 3.5 3.7   Recent Labs  Lab 06/26/24 0706  LIPASE 28   No results for input(s): AMMONIA in the last 168 hours. CBC: Recent Labs  Lab 06/21/24 0911 06/26/24 0706  WBC 11.1* 3.9*  NEUTROABS 9.3*  --   HGB 11.1* 11.8*  HCT 32.8* 35.3*  MCV 87.2 88.5  PLT 581* 510*   Cardiac Enzymes: No results for input(s): CKTOTAL, CKMB, CKMBINDEX, TROPONINI in the last 168  hours. BNP: Invalid input(s): POCBNP CBG: No results for input(s): GLUCAP in the last 168 hours. D-Dimer No results for input(s): DDIMER in the last 72 hours. Hgb A1c No results for input(s): HGBA1C in the last 72 hours. Lipid Profile No results for input(s): CHOL, HDL, LDLCALC, TRIG, CHOLHDL, LDLDIRECT in the last 72 hours. Thyroid  function studies No results for input(s): TSH, T4TOTAL, T3FREE, THYROIDAB in the last 72 hours.  Invalid input(s): FREET3 Anemia work up No results for input(s): VITAMINB12, FOLATE, FERRITIN, TIBC, IRON, RETICCTPCT in the last 72 hours. Urinalysis    Component Value Date/Time   COLORURINE YELLOW (A) 06/26/2024 1458   APPEARANCEUR HAZY (A) 06/26/2024 1458   LABSPEC 1.020 06/26/2024 1458   PHURINE 7.0 06/26/2024 1458   GLUCOSEU NEGATIVE 06/26/2024 1458   HGBUR SMALL (A) 06/26/2024 1458   BILIRUBINUR NEGATIVE 06/26/2024 1458   KETONESUR 20 (A) 06/26/2024 1458   PROTEINUR 30 (A) 06/26/2024 1458   UROBILINOGEN 1.0 05/20/2015 1230   NITRITE NEGATIVE 06/26/2024 1458   LEUKOCYTESUR NEGATIVE 06/26/2024 1458   Sepsis Labs Recent Labs  Lab 06/21/24 0911 06/26/24 0706  WBC 11.1* 3.9*   Microbiology No results found for this or any previous visit (from the past 240 hours).   Total time spend on discharging this patient, including the last patient exam, discussing the hospital stay, instructions for ongoing care as it relates to all pertinent caregivers, as well as preparing the medical discharge records, prescriptions, and/or referrals as applicable, is 45 minutes.    Ellouise Haber, MD  [facility] 06/27/2024, 9:38 AM

## 2024-06-27 NOTE — Progress Notes (Signed)
 Declines wheelchair at dc, pt ambulating without difficulty

## 2024-06-27 NOTE — Progress Notes (Signed)
 Initial Nutrition Assessment  DOCUMENTATION CODES:   Obesity unspecified  INTERVENTION:   -D/c Ensure Plus High Protein po BID, each supplement provides 350 kcal and 20 grams of protein  -MVI with minerals daily -30 ml Prosource Plus TID, each supplement provides 100 kcals and 15 grams protein -Continue full liquid diet; RD will follow for diet advancement and adjust supplement regimen as appropriate  -RD forwarded note to RD at [facility] for continuity of care   NUTRITION DIAGNOSIS:   Increased nutrient needs related to cancer and cancer related treatments as evidenced by estimated needs.  GOAL:   Patient will meet greater than or equal to 90% of their needs  MONITOR:   PO intake, Supplement acceptance, Diet advancement  REASON FOR ASSESSMENT:   Malnutrition Screening Tool    ASSESSMENT:   28 y.o. 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  Patient admitted with acute dysphagia, acute throat pain, dehydration, and stage III EBV positive nasopharyngeal carcinoma.   Reviewed I/O's: +2.1 L x 24 hours  UOP: 400 ml x 24 hours   Per oncology notes, patient receiving daily radiation treatments and last received cisplatin last week. Next scheduled oncology appointment scheduled for 06/29/24.  Spoke with Brandi [name] at bedside, who was pleasant and in good spirits today. She was smiling and reports feeling better today. She shares that she has a good appetite, but endorses discomfort in swallowing, explaining that swallowing aggravates her gag reflex, which causes most oral intake to come up. This occurs with all oral intake, with the exception of ice chips and juices. Per patient, these symptoms have been occurring over the past 1.5 weeks, and she has been only able to consume ice and cranberry juice during this  time period.   She [facility]; she shares that she has not been able to tolerate most supplements to to not liking taste, but does endorse liking the Ensure Clear supplements; informed that these were not available on hospital formulary, but encouraged use at home. She is refusing Boost Breeze secondary to bad taste and after taste.   Patient endorses weight loss over the past 1.5 weeks. Reviewed weight history; she has experienced a 3.7% weight loss over the past 2 months, which is not significant for time frame.   Discussed importance of good meal and supplement intake to promote healing. Reviewed supplements on formulary; RN and RD provided Prosource to try with encouragement.   Per discussion with RN, plan to discharge home today.   Medications reviewed and include protonix , senokot, carafate , and 0.9% NaCl with KCl 40 mEq/L infusion @ 125 ml/hr. .   Labs reviewed: Na: 132.   NUTRITION - FOCUSED PHYSICAL EXAM:  Flowsheet Row Most Recent Value  Orbital Region No depletion  Upper Arm Region Mild depletion  Thoracic and Lumbar Region No depletion  Buccal Region No depletion  Temple Region No depletion  Clavicle Bone Region No depletion  Clavicle and Acromion Bone Region No depletion  Scapular Bone Region No depletion  Dorsal Hand No depletion  Patellar Region No depletion  Anterior Thigh Region No depletion  Posterior Calf Region No depletion  Edema (RD Assessment) None  Hair Reviewed  Eyes Reviewed  Mouth Reviewed  Skin Reviewed  Nails Reviewed    Diet Order:   Diet Order  Diet full liquid Room service appropriate? Yes; Fluid consistency: Thin  Diet effective now                   EDUCATION NEEDS:   Education needs have been addressed  Skin:  Skin Assessment: Reviewed RN Assessment  Last BM:  06/26/24  Height:   Ht Readings from Last 1 Encounters:  06/26/24 5' 2 (1.575 m)    Weight:   Wt Readings from  Last 1 Encounters:  06/26/24 95 kg    Ideal Body Weight:  50 kg  BMI:  Body mass index is 38.31 kg/m.  Estimated Nutritional Needs:   Kcal:  1750-1950  Protein:  100-115 grams  Fluid:  1.7-1.9 L    Brandi [name], RD, LDN, CDCES Registered Dietitian III Certified Diabetes Care and Education Specialist If unable to reach this RD, please use RD Inpatient group chat on secure chat between hours of 8am-4 pm daily

## 2024-06-27 NOTE — Plan of Care (Signed)
  Problem: Education: Goal: Knowledge of General Education information will improve Description: Including pain rating scale, medication(s)/side effects and non-pharmacologic comfort measures Outcome: Progressing   Problem: Coping: Goal: Level of anxiety will decrease Outcome: Progressing   Problem: Pain Managment: Goal: General experience of comfort will improve and/or be controlled Outcome: Progressing

## 2024-06-28 ENCOUNTER — Other Ambulatory Visit: Payer: Self-pay

## 2024-06-28 ENCOUNTER — Ambulatory Visit

## 2024-06-28 ENCOUNTER — Ambulatory Visit: Admitting: Oncology

## 2024-06-28 ENCOUNTER — Other Ambulatory Visit

## 2024-06-28 ENCOUNTER — Inpatient Hospital Stay

## 2024-06-28 ENCOUNTER — Other Ambulatory Visit: Payer: Self-pay | Admitting: *Deleted

## 2024-06-28 DIAGNOSIS — R1310 Dysphagia, unspecified: Secondary | ICD-10-CM

## 2024-06-28 DIAGNOSIS — C119 Malignant neoplasm of nasopharynx, unspecified: Secondary | ICD-10-CM

## 2024-06-28 NOTE — Progress Notes (Addendum)
 Nutrition Follow-up:  Patient with EBV positive nasopharyngeal carcinoma.  Receiving concurrent chemotherapy and radiation.  Hospitalization for sore throat  10/27-10/28.  Met with patient today in clinic.  Reports that she has been unable to eat much in the last 2 weeks due to nausea, vomiting, sore throat from radiation, no taste.  Says that foods do not have much taste and when she tries to eat it causes her to get nauseated.  She has mainly been drinking fluids and eating ice.  Tried the ensure clear and sister has ordered her some.  Can't take the lidocaine  because it is too thick and causes nausea.  Has tried a small amount of applesauce, jello.  Overall intake extremely poor.  Also says that she is a picky eater.    NFPE:    Normal in all areas  Medications: lidocaine , compazine, zofran   Labs: Na 132, glucose 109  Anthropometrics:   Weight 204 lb 2 oz today 210 lb on 10/22 219 lb on 10/19 216 lb on 8/21 235 lb on 02/29/24 233 lb on 08/05/23  3% weight loss in the last week, significant   Estimated Energy Needs  Kcals: 2000-2300 Protein: 95-114 g Fluid: > 2000 ml  NUTRITION DIAGNOSIS: Inadequate oral intake ongoing   MALNUTRITION DIAGNOSIS: Patient meets criteria for severe malnutrition in context of acute illness with 3% weight loss in the last week and eating less than 50% estimated energy needs in greater than 5 days.     INTERVENTION:  Discussed with patient option of feeding tube placement during treatment as additional source of nutrition.  Symptoms with taste and dysphagia not to improve with continuation of treatment. Patient interested in seeing sample tube. Discussed foods options to try but patient picker.  Many of suggestions does not feel that she can do.   Recommend referral to SLP    MONITORING, EVALUATION, GOAL: weight trends, intake   NEXT VISIT: tomorrow  Gloris Shiroma B. Dasie SOLON, CSO, LDN Registered Dietitian [phone]

## 2024-06-29 ENCOUNTER — Ambulatory Visit
Admission: RE | Admit: 2024-06-29 | Discharge: 2024-06-29 | Disposition: A | Discharge status: Home / Self Care | Source: Ambulatory Visit | Attending: Radiation Oncology | Admitting: Radiation Oncology

## 2024-06-29 ENCOUNTER — Inpatient Hospital Stay

## 2024-06-29 ENCOUNTER — Other Ambulatory Visit: Payer: Self-pay

## 2024-06-29 ENCOUNTER — Encounter: Payer: Self-pay | Admitting: Oncology

## 2024-06-29 ENCOUNTER — Inpatient Hospital Stay (HOSPITAL_BASED_OUTPATIENT_CLINIC_OR_DEPARTMENT_OTHER): Admitting: Oncology

## 2024-06-29 VITALS — BP 149/100 | HR 77 | Temp 97.9°F | Resp 18 | Ht 62.0 in | Wt 201.0 lb

## 2024-06-29 VITALS — BP 135/88 | HR 83 | Resp 18

## 2024-06-29 DIAGNOSIS — Z510 Encounter for antineoplastic radiation therapy: Secondary | ICD-10-CM | POA: Diagnosis not present

## 2024-06-29 DIAGNOSIS — C119 Malignant neoplasm of nasopharynx, unspecified: Secondary | ICD-10-CM

## 2024-06-29 LAB — RAD ONC ARIA SESSION SUMMARY
Course Elapsed Days: 16
Plan Fractions Treated to Date: 10
Plan Prescribed Dose Per Fraction: 2 Gy
Plan Total Fractions Prescribed: 35
Plan Total Prescribed Dose: 70 Gy
Reference Point Dosage Given to Date: 20 Gy
Reference Point Session Dosage Given: 2 Gy
Session Number: 10

## 2024-06-29 LAB — CBC WITH DIFFERENTIAL (CANCER CENTER ONLY)
Abs Immature Granulocytes: 0.01 K/uL (ref 0.00–0.07)
Basophils Absolute: 0 K/uL (ref 0.0–0.1)
Basophils Relative: 0 %
Eosinophils Absolute: 0 K/uL (ref 0.0–0.5)
Eosinophils Relative: 1 %
HCT: 33.9 % — ABNORMAL LOW (ref 36.0–46.0)
Hemoglobin: 11.4 g/dL — ABNORMAL LOW (ref 12.0–15.0)
Immature Granulocytes: 0 %
Lymphocytes Relative: 14 %
Lymphs Abs: 0.5 K/uL — ABNORMAL LOW (ref 0.7–4.0)
MCH: 29.4 pg (ref 26.0–34.0)
MCHC: 33.6 g/dL (ref 30.0–36.0)
MCV: 87.4 fL (ref 80.0–100.0)
Monocytes Absolute: 0.5 K/uL (ref 0.1–1.0)
Monocytes Relative: 14 %
Neutro Abs: 2.4 K/uL (ref 1.7–7.7)
Neutrophils Relative %: 71 %
Platelet Count: 440 K/uL — ABNORMAL HIGH (ref 150–400)
RBC: 3.88 MIL/uL (ref 3.87–5.11)
RDW: 13.2 % (ref 11.5–15.5)
WBC Count: 3.4 K/uL — ABNORMAL LOW (ref 4.0–10.5)
nRBC: 0 % (ref 0.0–0.2)

## 2024-06-29 LAB — BASIC METABOLIC PANEL - CANCER CENTER ONLY
Anion gap: 11 (ref 5–15)
BUN: 10 mg/dL (ref 6–20)
CO2: 21 mmol/L — ABNORMAL LOW (ref 22–32)
Calcium: 9.2 mg/dL (ref 8.9–10.3)
Chloride: 102 mmol/L (ref 98–111)
Creatinine: 0.85 mg/dL (ref 0.44–1.00)
GFR, Estimated: >60 mL/min (ref >60)
Glucose, Bld: 106 mg/dL — ABNORMAL HIGH (ref 70–99)
Potassium: 3.3 mmol/L — ABNORMAL LOW (ref 3.5–5.1)
Sodium: 134 mmol/L — ABNORMAL LOW (ref 135–145)

## 2024-06-29 LAB — MAGNESIUM: Magnesium: 1.9 mg/dL (ref 1.7–2.4)

## 2024-06-29 MED ORDER — SODIUM CHLORIDE 0.9 % IV SOLN
40.0000 mg/m2 | Freq: Once | INTRAVENOUS | Status: AC
  Administered 2024-06-29: 84 mg via INTRAVENOUS
  Filled 2024-06-29: qty 84

## 2024-06-29 MED ORDER — DEXAMETHASONE SOD PHOSPHATE PF 10 MG/ML IJ SOLN
10.0000 mg | Freq: Once | INTRAMUSCULAR | Status: AC
  Administered 2024-06-29: 10 mg via INTRAVENOUS

## 2024-06-29 MED ORDER — APREPITANT 130 MG/18ML IV EMUL
130.0000 mg | Freq: Once | INTRAVENOUS | Status: AC
  Administered 2024-06-29: 130 mg via INTRAVENOUS

## 2024-06-29 MED ORDER — MAGNESIUM SULFATE 2 GM/50ML IV SOLN
2.0000 g | Freq: Once | INTRAVENOUS | Status: AC
  Administered 2024-06-29: 2 g via INTRAVENOUS
  Filled 2024-06-29: qty 50

## 2024-06-29 MED ORDER — SODIUM CHLORIDE 0.9 % IV SOLN
INTRAVENOUS | Status: DC
  Filled 2024-06-29: qty 250

## 2024-06-29 MED ORDER — POTASSIUM CHLORIDE IN NACL 20-0.9 MEQ/L-% IV SOLN
Freq: Once | INTRAVENOUS | Status: AC
  Filled 2024-06-29: qty 1000

## 2024-06-29 MED ORDER — LORAZEPAM 2 MG/ML IJ SOLN
1.0000 mg | Freq: Once | INTRAMUSCULAR | Status: AC
  Administered 2024-06-29: 1 mg via INTRAVENOUS
  Filled 2024-06-29: qty 1

## 2024-06-29 MED ORDER — PALONOSETRON HCL INJECTION 0.25 MG/5ML
0.2500 mg | Freq: Once | INTRAVENOUS | Status: AC
  Administered 2024-06-29: 0.25 mg via INTRAVENOUS

## 2024-06-29 NOTE — Progress Notes (Signed)
 Okay to give premeds prior to prehydration fluids per Dr Jacobo.    Okay to run post hydration fluids with cisplatin per Dr Jacobo.

## 2024-06-29 NOTE — Progress Notes (Signed)
 [facility] CSW Progress Note  Clinical Social Work Intern met with patient in infusion suite to follow-up on need for community resources and emotional support.Patient reports she is working with [employer] to apply for social security disability. She has not accessed transportation through [facility] but denies transportation needs at this time. Patient reports she is struggling to find motivation to engage in daily activities without the routines that were in place before her cancer diagnosis. She is also having trouble eating enough since she cannot taste most foods.    Interventions: Provided brief mental health counseling with regard to adjusting to diagnosis, and explored coping strategies to reduce anxiety and create some structure by re-engaging in activities the pt enjoys: drawing/ art-making, martial arts, visiting art museum for upcoming birthday, spending time with nieces and nephews.  Provided information about art classes through [employer] Provided patient with bag of groceries from [employer] Dietician regarding resources for [employer] free Ensure  Reminded patient of support team roles and support services at [facility] Provided CSW contact information and encouraged patient to call with any questions or concerns       Follow Up Plan:  CSW will see patient on 11/6.    Thersia KATHEE Daring Clinical Social Work Intern [employer]    Patient is participating in a Managed Medicaid Plan:  Yes

## 2024-06-29 NOTE — Progress Notes (Unsigned)
 [facility]  Telephone:([phone] [phone]  ID: Brandi [name] OB: [birth year]  MR#: [id number]  RDW#:[id number]  Patient Care Team: [name], MD as PCP - General (Internal Medicine) [name], MD as Consulting Physician (Radiation Oncology) [name], MD as Consulting Physician (Oncology)  CHIEF COMPLAINT: Stage III EBV positive nasopharyngeal carcinoma.  INTERVAL HISTORY: Patient returns to clinic today for further evaluation, hospital follow-up, and consideration of cycle 3 of weekly cisplatin.  After receiving several days off of XRT, her dysphagia has improved.  She does not complain of pain today.  She has no neurologic complaints.  She denies any recent fevers.  She has a fair appetite and denies weight loss.  She has no chest pain, shortness of breath, cough, or hemoptysis.  She denies any vomiting or diarrhea.  She has no urinary complaints.  Patient offers no further specific complaints today.  REVIEW OF SYSTEMS:   Review of Systems  Constitutional:  Positive for malaise/fatigue. Negative for chills and fever.  HENT:  Negative for sore throat.   Respiratory: Negative.  Negative for cough, hemoptysis and shortness of breath.   Cardiovascular: Negative.  Negative for chest pain and leg swelling.  Gastrointestinal: Negative.  Negative for abdominal pain, constipation, nausea and vomiting.  Genitourinary: Negative.  Negative for dysuria.  Musculoskeletal: Negative.  Negative for back pain.  Skin: Negative.  Negative for rash.  Neurological: Negative.  Negative for dizziness, focal weakness, weakness and headaches.  Psychiatric/Behavioral:  The patient is nervous/anxious.     As per HPI. Otherwise, a complete review of systems is negative.  PAST MEDICAL HISTORY: Past Medical History:  Diagnosis Date   Asthma    Cancer (HCC)    GERD (gastroesophageal reflux disease)    HTN (hypertension)     PAST SURGICAL  HISTORY: Past Surgical History:  Procedure Laterality Date   dental procedure     IR US  LIVER BIOPSY  05/17/2024    FAMILY HISTORY: Family History  Problem Relation Age of Onset   Hypertension Maternal Grandmother    Diabetes Maternal Grandmother     ADVANCED DIRECTIVES (Y/N):  N  HEALTH MAINTENANCE: Social History   Tobacco Use   Smoking status: Former    Types: E-cigarettes   Smokeless tobacco: Never  Vaping Use   Vaping status: Former  Substance Use Topics   Alcohol use: Not Currently    Comment: social   Drug use: Yes    Types: Marijuana     Colonoscopy:  PAP:  Bone density:  Lipid panel:  Allergies  Allergen Reactions   Shellfish Allergy Anaphylaxis   Tomato Other (See Comments)    Flares up exema    Current Outpatient Medications  Medication Sig Dispense Refill   acetaminophen  (TYLENOL ) 325 MG tablet Take 2 tablets (650 mg total) by mouth every 6 (six) hours as needed for mild pain (pain score 1-3) or fever (or Fever >/= 101).     ALPRAZolam  (XANAX ) 0.25 MG tablet Take 1 tablet (0.25 mg total) by mouth at bedtime as needed for anxiety. 30 tablet 0   cyclobenzaprine  (FLEXERIL ) 10 MG tablet Take 1 tablet (10 mg total) by mouth 3 (three) times daily as needed for muscle spasms. 90 tablet 0   gentamicin ointment (GARAMYCIN) 0.1 % Apply 1 Application topically 3 (three) times daily.     HYDROcodone -acetaminophen  (NORCO/VICODIN) 5-325 MG tablet Take 1 tablet by mouth every 6 (six) hours as needed for moderate pain (pain score 4-6). 15 tablet 0  lidocaine  (XYLOCAINE ) 2 % solution Use as directed 15 mLs in the mouth or throat every 3 (three) hours as needed for mouth pain. swish and swallow. 100 mL 1   ondansetron  (ZOFRAN ) 8 MG tablet Take 1 tablet (8 mg total) by mouth every 8 (eight) hours as needed for nausea or vomiting. Start on the third day after cisplatin. 60 tablet 1   pantoprazole  (PROTONIX ) 40 MG tablet Take 1 tablet (40 mg total) by mouth daily. 30  tablet 11   phenol (CHLORASEPTIC) 1.4 % LIQD Use as directed 1 spray in the mouth or throat as needed for throat irritation / pain.     prochlorperazine (COMPAZINE) 10 MG tablet Take 1 tablet (10 mg total) by mouth every 6 (six) hours as needed (Nausea or vomiting). 60 tablet 1   senna-docusate (SENOKOT-S) 8.6-50 MG tablet Take 1 tablet by mouth 2 (two) times daily. 15 tablet 0   SLYND  4 MG TABS Take 1 tablet by mouth daily.     No current facility-administered medications for this visit.    OBJECTIVE: Vitals:   06/29/24 0857  BP: (!) 149/100  Pulse: 77  Resp: 18  Temp: 97.9 F (36.6 C)  SpO2: 100%      Body mass index is 36.76 kg/m.    ECOG FS:0 - Asymptomatic  General: Well-developed, well-nourished, no acute distress. Eyes: Pink conjunctiva, anicteric sclera. HEENT: Normocephalic, moist mucous membranes. Lungs: No audible wheezing or coughing. Heart: Regular rate and rhythm. Abdomen: Soft, nontender, no obvious distention. Musculoskeletal: No edema, cyanosis, or clubbing. Neuro: Alert, answering all questions appropriately. Cranial nerves grossly intact. Skin: No rashes or petechiae noted. Psych: Normal affect.  LAB RESULTS:  Lab Results  Component Value Date   NA 134 (L) 06/29/2024   K 3.3 (L) 06/29/2024   CL 102 06/29/2024   CO2 21 (L) 06/29/2024   GLUCOSE 106 (H) 06/29/2024   BUN 10 06/29/2024   CREATININE 0.85 06/29/2024   CALCIUM 9.2 06/29/2024   PROT 8.3 (H) 06/26/2024   ALBUMIN 3.7 06/26/2024   AST 39 06/26/2024   ALT 47 (H) 06/26/2024   ALKPHOS 56 06/26/2024   BILITOT 1.0 06/26/2024   GFRNONAA >60 06/29/2024   GFRAA >60 12/05/2019    Lab Results  Component Value Date   WBC 3.4 (L) 06/29/2024   NEUTROABS 2.4 06/29/2024   HGB 11.4 (L) 06/29/2024   HCT 33.9 (L) 06/29/2024   MCV 87.4 06/29/2024   PLT 440 (H) 06/29/2024     STUDIES: CT ABDOMEN PELVIS W CONTRAST Result Date: 06/18/2024 EXAM: CT ABDOMEN AND PELVIS WITH CONTRAST 06/18/2024  12:25:32 PM TECHNIQUE: CT of the abdomen and pelvis was performed with the administration of 100 mL of iohexol  (OMNIPAQUE ) 300 MG/ML solution. Multiplanar reformatted images are provided for review. Automated exposure control, iterative reconstruction, and/or weight-based adjustment of the mA/kV was utilized to reduce the radiation dose to as low as reasonably achievable. COMPARISON: PET CT fusion study dated 05/26/2024 and CT of the abdomen and pelvis dated 04/05/2022. CLINICAL HISTORY: left lower back/abdominal pain. Pt comes with c/o back pain and some lower belly pain. Pt states she had 1st round of chemo on Wed and radiation everyday except the weekend. Pt states since that her back has been hurting. Pt states she thinks they didn't flush her out good enough. Pt states she only got two bags of fluids and it was suppose to be 4. FINDINGS: LOWER CHEST: No acute abnormality. LIVER: The liver is unremarkable. GALLBLADDER AND BILE DUCTS:  Gallbladder is unremarkable. No biliary ductal dilatation. SPLEEN: No acute abnormality. PANCREAS: No acute abnormality. ADRENAL GLANDS: No acute abnormality. KIDNEYS, URETERS AND BLADDER: No stones in the kidneys or ureters. No hydronephrosis. No perinephric or periureteral stranding. Urinary bladder is unremarkable. GI AND BOWEL: There are few scattered colonic diverticula. Stomach demonstrates no acute abnormality. There is no bowel obstruction. PERITONEUM AND RETROPERITONEUM: No ascites. No free air. VASCULATURE: Aorta is normal in caliber. LYMPH NODES: No lymphadenopathy. REPRODUCTIVE ORGANS: No acute abnormality. BONES AND SOFT TISSUES: No acute osseous abnormality. No focal soft tissue abnormality. IMPRESSION: 1. No acute findings in the abdomen or pelvis. 2. Scattered colonic diverticula without evidence of diverticulitis. Electronically signed by: Evalene Coho MD 06/18/2024 12:55 PM EDT RP Workstation: [id number]    ASSESSMENT: Stage III EBV positive nasopharyngeal  carcinoma.  PLAN:    Stage III EBV positive nasopharyngeal carcinoma: PET scan results from May 26, 2024 reviewed independently confirming stage of disease.  Patient will benefit from concurrent chemotherapy using weekly cisplatin along with daily XRT.  We discussed the possibility of port placement, but patient declined at this time.  Proceed with cycle 3 of weekly cisplatin today.  Patient will also reinitiate daily XRT.  Return to clinic in 1 week for further evaluation and consideration of cycle 4.     Anxiety: Continue Xanax  as prescribed. Nausea: Continue Zofran  and Compazine as prescribed. Constipation: Patient does not complain of this today.  Continue stool softeners. Back pain: Patient does not complain of this today.  Continue Flexeril  as needed. Leukopenia: Mild, monitor. Anemia: Chronic and unchanged.  Patient's hemoglobin is 11.4.SABRA   Thrombocytosis: Likely reactive. Hyponatremia: Mild, monitor. Hypokalemia: Patient's potassium level was 3.3, monitor. Dysphagia: Continue Magic mouthwash as prescribed.   Patient expressed understanding and was in agreement with this plan. She also understands that She can call clinic at any time with any questions, concerns, or complaints.    Cancer Staging  Nasopharyngeal carcinoma [facility]) Staging form: Pharynx - Nasopharynx, AJCC V9 - Clinical stage from 05/30/2024: Stage III (cTX, cN3, cM0) - Signed by [name], MD on 05/30/2024 Stage prefix: Initial diagnosis Method of lymph node assessment: Clinical   Evalene PARAS Jacobo, MD   06/30/2024 8:46 AM

## 2024-06-29 NOTE — Progress Notes (Deleted)
 Brandi [name]

## 2024-06-29 NOTE — Progress Notes (Unsigned)
 Patient has no appetite and the back of her throat is hurting, but only when she goes to yawn. Her taste is completely gone.

## 2024-06-29 NOTE — Progress Notes (Signed)
 Nutrition Follow-up:   Patient with EBV nasopharyngeal carcinoma.  Receiving concurrent chemotherapy (cisplatin) and radiation.  Recent hospitalization (10/27-10/28) for dysphagia, painful swallowing (sore throat) and dehydration.     Met with patient in infusion.  Reports that she was able to eat eggs and applesauce yesterday.  Overall intake for the last 1.5-2 weeks has been mostly ice chips and juices.  Likes ensure clear shakes. Tried boost breeze in the hospital and did not like it.  Having taste alterations.  Feels nauseated during visit today.     Reports that she has decided to get feeding tube only as last resort.         Medications: compazine, protonix , zofran , senna, chloraseptic spray, lidocaine    Labs: Na 134, K 3.3, Mag 1.6   Anthropometrics:    Weight 201 lb today (different scale than yesterday)   204 lb 2 oz yesterday 210 lb on 10/22 219 lb on 10/19 216 lb on 8/21 235 lb on 02/29/24 233 lb on 08/04/24   14% weight loss in the last 3 months, significant   Estimated Energy Needs   Kcals: 2000-2300 Protein: 95-114 g Fluid: > 2000 ml   NUTRITION DIAGNOSIS: Inadequate oral intake - ongoing     MALNUTRITION DIAGNOSIS: Severe malnutrition continues     INTERVENTION:  Patient wants to hold off on feeding tube at this time. Encouraged ensure clear shakes  Encouraged eating q 2-3 hours even if just a few bites.  Encouraged antinausea medication and chloraseptic spray     MONITORING, EVALUATION, GOAL: weight trends, intake     NEXT VISIT: Thursday, Nov 6 during infusion   Tevis Dunavan B. Dasie SOLON, CSO, LDN Registered Dietitian [phone]

## 2024-06-30 ENCOUNTER — Encounter: Payer: Self-pay | Admitting: Oncology

## 2024-06-30 ENCOUNTER — Ambulatory Visit
Admission: RE | Admit: 2024-06-30 | Discharge: 2024-06-30 | Disposition: A | Discharge status: Home / Self Care | Source: Ambulatory Visit | Attending: Radiation Oncology | Admitting: Radiation Oncology

## 2024-06-30 ENCOUNTER — Other Ambulatory Visit: Payer: Self-pay

## 2024-06-30 DIAGNOSIS — Z510 Encounter for antineoplastic radiation therapy: Secondary | ICD-10-CM | POA: Diagnosis not present

## 2024-06-30 LAB — RAD ONC ARIA SESSION SUMMARY
Course Elapsed Days: 17
Plan Fractions Treated to Date: 11
Plan Prescribed Dose Per Fraction: 2 Gy
Plan Total Fractions Prescribed: 35
Plan Total Prescribed Dose: 70 Gy
Reference Point Dosage Given to Date: 22 Gy
Reference Point Session Dosage Given: 2 Gy
Session Number: 11

## 2024-07-03 ENCOUNTER — Emergency Department

## 2024-07-03 ENCOUNTER — Other Ambulatory Visit: Payer: Self-pay

## 2024-07-03 ENCOUNTER — Inpatient Hospital Stay
Admission: EM | Admit: 2024-07-03 | Discharge: 2024-07-09 | DRG: 392 | Disposition: A | Discharge status: Home / Self Care | Attending: Internal Medicine | Admitting: Internal Medicine

## 2024-07-03 ENCOUNTER — Ambulatory Visit

## 2024-07-03 ENCOUNTER — Encounter: Payer: Self-pay | Admitting: Emergency Medicine

## 2024-07-03 DIAGNOSIS — B2700 Gammaherpesviral mononucleosis without complication: Secondary | ICD-10-CM | POA: Diagnosis present

## 2024-07-03 DIAGNOSIS — D72819 Decreased white blood cell count, unspecified: Secondary | ICD-10-CM | POA: Diagnosis present

## 2024-07-03 DIAGNOSIS — E860 Dehydration: Secondary | ICD-10-CM | POA: Diagnosis present

## 2024-07-03 DIAGNOSIS — K5909 Other constipation: Secondary | ICD-10-CM | POA: Diagnosis present

## 2024-07-03 DIAGNOSIS — D6481 Anemia due to antineoplastic chemotherapy: Secondary | ICD-10-CM | POA: Diagnosis present

## 2024-07-03 DIAGNOSIS — R109 Unspecified abdominal pain: Secondary | ICD-10-CM

## 2024-07-03 DIAGNOSIS — Z833 Family history of diabetes mellitus: Secondary | ICD-10-CM

## 2024-07-03 DIAGNOSIS — Z91018 Allergy to other foods: Secondary | ICD-10-CM

## 2024-07-03 DIAGNOSIS — I10 Essential (primary) hypertension: Secondary | ICD-10-CM | POA: Diagnosis not present

## 2024-07-03 DIAGNOSIS — T451X5A Adverse effect of antineoplastic and immunosuppressive drugs, initial encounter: Secondary | ICD-10-CM | POA: Diagnosis present

## 2024-07-03 DIAGNOSIS — R1310 Dysphagia, unspecified: Principal | ICD-10-CM | POA: Diagnosis present

## 2024-07-03 DIAGNOSIS — K219 Gastro-esophageal reflux disease without esophagitis: Secondary | ICD-10-CM | POA: Diagnosis present

## 2024-07-03 DIAGNOSIS — R112 Nausea with vomiting, unspecified: Secondary | ICD-10-CM | POA: Diagnosis present

## 2024-07-03 DIAGNOSIS — T451X5D Adverse effect of antineoplastic and immunosuppressive drugs, subsequent encounter: Secondary | ICD-10-CM

## 2024-07-03 DIAGNOSIS — E871 Hypo-osmolality and hyponatremia: Secondary | ICD-10-CM | POA: Insufficient documentation

## 2024-07-03 DIAGNOSIS — G893 Neoplasm related pain (acute) (chronic): Secondary | ICD-10-CM | POA: Diagnosis present

## 2024-07-03 DIAGNOSIS — K2970 Gastritis, unspecified, without bleeding: Secondary | ICD-10-CM | POA: Diagnosis not present

## 2024-07-03 DIAGNOSIS — E872 Acidosis, unspecified: Secondary | ICD-10-CM | POA: Diagnosis present

## 2024-07-03 DIAGNOSIS — T50995A Adverse effect of other drugs, medicaments and biological substances, initial encounter: Secondary | ICD-10-CM | POA: Diagnosis present

## 2024-07-03 DIAGNOSIS — C119 Malignant neoplasm of nasopharynx, unspecified: Secondary | ICD-10-CM | POA: Diagnosis present

## 2024-07-03 DIAGNOSIS — Z87891 Personal history of nicotine dependence: Secondary | ICD-10-CM

## 2024-07-03 DIAGNOSIS — Z91013 Allergy to seafood: Secondary | ICD-10-CM

## 2024-07-03 DIAGNOSIS — J45909 Unspecified asthma, uncomplicated: Secondary | ICD-10-CM | POA: Diagnosis present

## 2024-07-03 DIAGNOSIS — R5381 Other malaise: Secondary | ICD-10-CM | POA: Diagnosis present

## 2024-07-03 DIAGNOSIS — E876 Hypokalemia: Secondary | ICD-10-CM | POA: Diagnosis present

## 2024-07-03 DIAGNOSIS — E669 Obesity, unspecified: Secondary | ICD-10-CM | POA: Diagnosis present

## 2024-07-03 DIAGNOSIS — Z8249 Family history of ischemic heart disease and other diseases of the circulatory system: Secondary | ICD-10-CM

## 2024-07-03 DIAGNOSIS — R52 Pain, unspecified: Principal | ICD-10-CM

## 2024-07-03 DIAGNOSIS — F1290 Cannabis use, unspecified, uncomplicated: Secondary | ICD-10-CM | POA: Diagnosis present

## 2024-07-03 DIAGNOSIS — Z6837 Body mass index (BMI) 37.0-37.9, adult: Secondary | ICD-10-CM

## 2024-07-03 LAB — COMPREHENSIVE METABOLIC PANEL WITH GFR
ALT: 54 U/L — ABNORMAL HIGH (ref 0–44)
AST: 28 U/L (ref 15–41)
Albumin: 4 g/dL (ref 3.5–5.0)
Alkaline Phosphatase: 54 U/L (ref 38–126)
Anion gap: 18 — ABNORMAL HIGH (ref 5–15)
BUN: 13 mg/dL (ref 6–20)
CO2: 20 mmol/L — ABNORMAL LOW (ref 22–32)
Calcium: 9.3 mg/dL (ref 8.9–10.3)
Chloride: 95 mmol/L — ABNORMAL LOW (ref 98–111)
Creatinine, Ser: 0.84 mg/dL (ref 0.44–1.00)
GFR, Estimated: >60 mL/min (ref >60)
Glucose, Bld: 97 mg/dL (ref 70–99)
Potassium: 3.1 mmol/L — ABNORMAL LOW (ref 3.5–5.1)
Sodium: 133 mmol/L — ABNORMAL LOW (ref 135–145)
Total Bilirubin: 1 mg/dL (ref 0.0–1.2)
Total Protein: 8.2 g/dL — ABNORMAL HIGH (ref 6.5–8.1)

## 2024-07-03 LAB — URINALYSIS, COMPLETE (UACMP) WITH MICROSCOPIC
Bilirubin Urine: NEGATIVE
Glucose, UA: 50 mg/dL — AB
Ketones, ur: 80 mg/dL — AB
Leukocytes,Ua: NEGATIVE
Nitrite: NEGATIVE
Protein, ur: 100 mg/dL — AB
RBC / HPF: >50 RBC/hpf (ref 0–5)
Specific Gravity, Urine: 1.028 (ref 1.005–1.030)
pH: 5 (ref 5.0–8.0)

## 2024-07-03 LAB — CBC WITH DIFFERENTIAL/PLATELET
Abs Immature Granulocytes: 0.01 10*3/uL (ref 0.00–0.07)
Basophils Absolute: 0 10*3/uL (ref 0.0–0.1)
Basophils Relative: 0 %
Eosinophils Absolute: 0 10*3/uL (ref 0.0–0.5)
Eosinophils Relative: 1 %
HCT: 35.1 % — ABNORMAL LOW (ref 36.0–46.0)
Hemoglobin: 11.7 g/dL — ABNORMAL LOW (ref 12.0–15.0)
Immature Granulocytes: 0 %
Lymphocytes Relative: 7 %
Lymphs Abs: 0.3 10*3/uL — ABNORMAL LOW (ref 0.7–4.0)
MCH: 29.2 pg (ref 26.0–34.0)
MCHC: 33.3 g/dL (ref 30.0–36.0)
MCV: 87.5 fL (ref 80.0–100.0)
Monocytes Absolute: 0.5 10*3/uL (ref 0.1–1.0)
Monocytes Relative: 13 %
Neutro Abs: 2.8 10*3/uL (ref 1.7–7.7)
Neutrophils Relative %: 79 %
Platelets: 383 10*3/uL (ref 150–400)
RBC: 4.01 MIL/uL (ref 3.87–5.11)
RDW: 13.6 % (ref 11.5–15.5)
WBC: 3.6 10*3/uL — ABNORMAL LOW (ref 4.0–10.5)
nRBC: 0 % (ref 0.0–0.2)

## 2024-07-03 LAB — POTASSIUM: Potassium: 3.7 mmol/L (ref 3.5–5.1)

## 2024-07-03 LAB — MAGNESIUM: Magnesium: 1.9 mg/dL (ref 1.7–2.4)

## 2024-07-03 LAB — CBG MONITORING, ED
Glucose-Capillary: 69 mg/dL — ABNORMAL LOW (ref 70–99)
Glucose-Capillary: 85 mg/dL (ref 70–99)

## 2024-07-03 MED ORDER — LACTATED RINGERS IV BOLUS
1000.0000 mL | Freq: Once | INTRAVENOUS | Status: AC
  Administered 2024-07-03: 1000 mL via INTRAVENOUS

## 2024-07-03 MED ORDER — PANTOPRAZOLE SODIUM 40 MG IV SOLR
40.0000 mg | Freq: Two times a day (BID) | INTRAVENOUS | Status: DC
  Administered 2024-07-03 – 2024-07-09 (×13): 40 mg via INTRAVENOUS
  Filled 2024-07-03 (×13): qty 10

## 2024-07-03 MED ORDER — PROSOURCE PLUS PO LIQD: 30.0000 mL | Freq: Three times a day (TID) | ORAL | Status: DC

## 2024-07-03 MED ORDER — CYCLOBENZAPRINE HCL 10 MG PO TABS
10.0000 mg | ORAL_TABLET | Freq: Three times a day (TID) | ORAL | Status: DC | PRN
Start: 2024-07-03 — End: 2024-07-09

## 2024-07-03 MED ORDER — MORPHINE SULFATE (PF) 4 MG/ML IV SOLN
4.0000 mg | INTRAVENOUS | Status: DC | PRN
  Administered 2024-07-04 – 2024-07-07 (×14): 4 mg via INTRAVENOUS
  Filled 2024-07-03 (×14): qty 1

## 2024-07-03 MED ORDER — LIDOCAINE VISCOUS HCL 2 % MT SOLN
15.0000 mL | OROMUCOSAL | Status: DC | PRN
Start: 2024-07-03 — End: 2024-07-09

## 2024-07-03 MED ORDER — ONDANSETRON HCL 4 MG PO TABS: 4.0000 mg | ORAL_TABLET | Freq: Four times a day (QID) | ORAL | Status: DC | PRN

## 2024-07-03 MED ORDER — DROSPIRENONE 4 MG PO TABS: 1.0000 | ORAL_TABLET | Freq: Every day | ORAL | Status: DC

## 2024-07-03 MED ORDER — SENNOSIDES-DOCUSATE SODIUM 8.6-50 MG PO TABS
1.0000 | ORAL_TABLET | Freq: Two times a day (BID) | ORAL | Status: DC
Start: 2024-07-03 — End: 2024-07-06
  Filled 2024-07-03 (×3): qty 1

## 2024-07-03 MED ORDER — MORPHINE SULFATE (PF) 4 MG/ML IV SOLN
4.0000 mg | Freq: Once | INTRAVENOUS | Status: AC
  Administered 2024-07-03: 4 mg via INTRAVENOUS
  Filled 2024-07-03: qty 1

## 2024-07-03 MED ORDER — PHENOL 1.4 % MT LIQD: 1.0000 | OROMUCOSAL | Status: DC | PRN

## 2024-07-03 MED ORDER — SODIUM CHLORIDE 0.9 % IV SOLN: INTRAVENOUS | Status: DC

## 2024-07-03 MED ORDER — POTASSIUM CHLORIDE CRYS ER 20 MEQ PO TBCR
40.0000 meq | EXTENDED_RELEASE_TABLET | Freq: Once | ORAL | Status: AC
  Administered 2024-07-03: 40 meq via ORAL
  Filled 2024-07-03: qty 2

## 2024-07-03 MED ORDER — ALPRAZOLAM 0.25 MG PO TABS
0.2500 mg | ORAL_TABLET | Freq: Every evening | ORAL | Status: DC | PRN
Start: 2024-07-03 — End: 2024-07-09

## 2024-07-03 MED ORDER — KCL IN DEXTROSE-NACL 20-5-0.45 MEQ/L-%-% IV SOLN
INTRAVENOUS | Status: AC
  Filled 2024-07-03 (×3): qty 1000

## 2024-07-03 MED ORDER — ONDANSETRON HCL 4 MG/2ML IJ SOLN
4.0000 mg | Freq: Four times a day (QID) | INTRAMUSCULAR | Status: DC | PRN
  Administered 2024-07-03 – 2024-07-08 (×7): 4 mg via INTRAVENOUS
  Filled 2024-07-03 (×7): qty 2

## 2024-07-03 MED ORDER — ENOXAPARIN SODIUM 60 MG/0.6ML IJ SOSY
45.0000 mg | PREFILLED_SYRINGE | INTRAMUSCULAR | Status: DC
  Administered 2024-07-03 – 2024-07-08 (×3): 45 mg via SUBCUTANEOUS
  Filled 2024-07-03 (×4): qty 0.6

## 2024-07-03 MED ORDER — ONDANSETRON HCL 4 MG/2ML IJ SOLN
4.0000 mg | Freq: Once | INTRAMUSCULAR | Status: AC
  Administered 2024-07-03: 4 mg via INTRAVENOUS
  Filled 2024-07-03: qty 2

## 2024-07-03 MED ORDER — ADULT MULTIVITAMIN W/MINERALS CH
1.0000 | ORAL_TABLET | Freq: Every day | ORAL | Status: DC
Start: 2024-07-04 — End: 2024-07-04
  Administered 2024-07-04: 1 via ORAL
  Filled 2024-07-03: qty 1

## 2024-07-03 MED ORDER — ENOXAPARIN SODIUM 40 MG/0.4ML IJ SOSY: 40.0000 mg | PREFILLED_SYRINGE | INTRAMUSCULAR | Status: DC

## 2024-07-03 NOTE — ED Notes (Signed)
 CCMD called to initiate cardiac monitoring.

## 2024-07-03 NOTE — ED Triage Notes (Signed)
 Pt to ED via POV with c/o generalized weakness and dehydration. Pt states has been unable to tolerate PO. Pt states dx with head and neck cancer. Pt currently undergoing chemo and radiation for cancer. Pt states was told by her doctor if she needed fluids to come to ED not wait for later today.   Pt states has been intermittently vomitting, c/o burning/soreness to her throat. Pt states last chemo was Thurs.

## 2024-07-03 NOTE — Assessment & Plan Note (Signed)
 Baseline EBV positive nasopharyngeal cancer followed by Dr. Jacobo outpatient On radiation and chemotherapy Noted secondary odynophagia and intractable nausea and vomiting.  Cont regimen including compazine, zofran , xanax , viscous lidocaine 

## 2024-07-03 NOTE — Progress Notes (Signed)
 PT Cancellation Note  Patient Details Name: Brandi [name] MRN: [id number] DOB: 15-Jul-1996   Cancelled Treatment:    Reason Eval/Treat Not Completed: PT screened, no needs identified, will sign off. Orders received and chart reviewed. Per OT, pt reports being tired and weak but functioning at her baseline with no concerns declining PT needs. PT to sign off.    Dorina HERO. Fairly IV, PT, DPT Physical Therapist- [facility] 07/03/2024, 1:31 PM

## 2024-07-03 NOTE — Assessment & Plan Note (Signed)
Stable from respiratory standpoint Continue home inhalers 

## 2024-07-03 NOTE — Assessment & Plan Note (Signed)
 Continue PPI.

## 2024-07-03 NOTE — Assessment & Plan Note (Addendum)
 Replaced

## 2024-07-03 NOTE — ED Notes (Signed)
 Pt ringing call light. Upon checking pt she was requesting to see if she was going to be given more fluids. 1st bag finished.

## 2024-07-03 NOTE — H&P (Signed)
 History and Physical    Patient: Brandi [name] FMW:[id number] DOB: Oct 08, 1995 DOA: 07/03/2024 DOS: the patient was seen and examined on 07/03/2024 PCP: [name], MD  Patient coming from: Home  Chief Complaint:  Chief Complaint  Patient presents with   Weakness   Dehydration   HPI: Brandi [name] is a 28 y.o. female with medical history significant of stage III EBV positive nasopharyngeal cancer on chemo and radiation therapy, HTN, asthma, obesity  presenting with intractable nausea and vomiting. Pt noted to have been recently diagnoses with diagnosed with stage III EBV positive nasopharyngeal carcinoma in September of this year, has started on chemo and radiation therapy- followed by Dr. Jacobo.  Was recently admitted under observation status October 27 and 28 for similar issues.  Per report, radiation has caused burning sensation as well as difficulty swallowing.  Patient states she was prescribed a PPI.  However, she cannot afford and has not been taking for multiple days.  Symptoms have caused severe odynophagia and dysphagia with inability to tolerate p.o. intake.  No chest pain or shortness of breath.  Positive nausea.  Mild belly pain and diarrhea.  No focal hemiparesis or confusion.  Still smoking marijuana.  Discussed sensation at length.  Has had worsening fatigue and malaise secondary to decreased p.o. intake. Presented to the ER afebrile, hemodynamically stable.  Satting well on room air.  White count 3.6, hemoglobin 11.7, platelets 383, creatinine 0.84.  Potassium 3.1.  Urinalysis not indicative of infection.  Chest x-ray stable. Review of Systems: As mentioned in the history of present illness. All other systems reviewed and are negative. Past Medical History:  Diagnosis Date   Asthma    Cancer (HCC)    GERD (gastroesophageal reflux disease)    HTN (hypertension)    Past Surgical History:  Procedure Laterality Date   dental procedure     IR US  LIVER BIOPSY  05/17/2024    Social History:  reports that she has quit smoking. Her smoking use included e-cigarettes. She has never used smokeless tobacco. She reports that she does not currently use alcohol. She reports current drug use. Drug: Marijuana.  Allergies  Allergen Reactions   Shellfish Allergy Anaphylaxis   Tomato Other (See Comments)    Flares up exema    Family History  Problem Relation Age of Onset   Hypertension Maternal Grandmother    Diabetes Maternal Grandmother     Prior to Admission medications   Medication Sig Start Date End Date Taking? Authorizing Provider  acetaminophen  (TYLENOL ) 325 MG tablet Take 2 tablets (650 mg total) by mouth every 6 (six) hours as needed for mild pain (pain score 1-3) or fever (or Fever >/= 101). 05/19/24  Yes Fausto Sor A, DO  ALPRAZolam  (XANAX ) 0.25 MG tablet Take 1 tablet (0.25 mg total) by mouth at bedtime as needed for anxiety. 06/15/24  Yes [name], MD  cyclobenzaprine  (FLEXERIL ) 10 MG tablet Take 1 tablet (10 mg total) by mouth 3 (three) times daily as needed for muscle spasms. 06/14/24  Yes [name], MD  HYDROcodone -acetaminophen  (NORCO/VICODIN) 5-325 MG tablet Take 1 tablet by mouth every 6 (six) hours as needed for moderate pain (pain score 4-6). 06/18/24  Yes Floy Roberts, MD  lidocaine  (XYLOCAINE ) 2 % solution Use as directed 15 mLs in the mouth or throat every 3 (three) hours as needed for mouth pain. swish and swallow. 06/27/24  Yes Awanda City, MD  ondansetron  (ZOFRAN ) 8 MG tablet Take 1 tablet (8 mg total) by  mouth every 8 (eight) hours as needed for nausea or vomiting. Start on the third day after cisplatin. 05/30/24  Yes [name], MD  pantoprazole  (PROTONIX ) 40 MG tablet Take 1 tablet (40 mg total) by mouth daily. 04/18/24 04/18/25 Yes Bradler, Evan K, MD  phenol (CHLORASEPTIC) 1.4 % LIQD Use as directed 1 spray in the mouth or throat as needed for throat irritation / pain. 06/27/24  Yes Awanda City, MD   prochlorperazine (COMPAZINE) 10 MG tablet Take 1 tablet (10 mg total) by mouth every 6 (six) hours as needed (Nausea or vomiting). 05/30/24  Yes [name], MD  senna-docusate (SENOKOT-S) 8.6-50 MG tablet Take 1 tablet by mouth 2 (two) times daily. 05/19/24  Yes Fausto Sor A, DO  SLYND  4 MG TABS Take 1 tablet by mouth daily. 05/20/24  Yes [provider]  gentamicin ointment (GARAMYCIN) 0.1 % Apply 1 Application topically 3 (three) times daily. Patient not taking: Reported on 07/03/2024 05/22/24   [provider]    Physical Exam: Vitals:   07/03/24 0449 07/03/24 0455 07/03/24 0612 07/03/24 0946  BP:  (!) 151/103 (!) 145/99   Pulse:  90 89   Resp:  12 13   Temp:  98.1 F (36.7 C)  98.1 F (36.7 C)  TempSrc:  Oral  Oral  SpO2:  100% 100%   Weight: 91.2 kg     Height: 5' 2 (1.575 m)      Physical Exam Constitutional:      Appearance: She is normal weight.  HENT:     Head: Normocephalic and atraumatic.     Nose: Nose normal.     Mouth/Throat:     Mouth: Mucous membranes are dry.  Eyes:     Pupils: Pupils are equal, round, and reactive to light.  Cardiovascular:     Rate and Rhythm: Normal rate and regular rhythm.  Pulmonary:     Effort: Pulmonary effort is normal.  Abdominal:     General: Bowel sounds are normal.  Musculoskeletal:        General: Normal range of motion.     Cervical back: Normal range of motion.  Skin:    General: Skin is warm and dry.  Neurological:     General: No focal deficit present.  Psychiatric:        Mood and Affect: Mood normal.     Data Reviewed:  There are no new results to review at this time.  DG Chest 1 View EXAM: 1 VIEW(S) XRAY OF THE CHEST 07/03/2024 05:10:42 AM  COMPARISON: PA and lateral radiographs of the chest dated 10/31/2012.  CLINICAL HISTORY: worsening weakness  FINDINGS:  LINES, TUBES AND DEVICES: Monitor wires noted.  LUNGS AND PLEURA: No focal pulmonary opacity. No pulmonary  edema. No pleural effusion. No pneumothorax.  HEART AND MEDIASTINUM: No acute abnormality of the cardiac and mediastinal silhouettes.  BONES AND SOFT TISSUES: No acute osseous abnormality.  IMPRESSION: 1. No acute process.  Electronically signed by: Evalene Coho MD 07/03/2024 05:20 AM EST RP Workstation: [id number]  Lab Results  Component Value Date   WBC 3.6 (L) 07/03/2024   HGB 11.7 (L) 07/03/2024   HCT 35.1 (L) 07/03/2024   MCV 87.5 07/03/2024   PLT 383 07/03/2024   Last metabolic panel Lab Results  Component Value Date   GLUCOSE 97 07/03/2024   NA 133 (L) 07/03/2024   K 3.1 (L) 07/03/2024   CL 95 (L) 07/03/2024   CO2 20 (L) 07/03/2024   BUN  13 07/03/2024   CREATININE 0.84 07/03/2024   GFRNONAA >60 07/03/2024   CALCIUM 9.3 07/03/2024   PHOS 3.8 03/04/2024   PROT 8.2 (H) 07/03/2024   ALBUMIN 4.0 07/03/2024   BILITOT 1.0 07/03/2024   ALKPHOS 54 07/03/2024   AST 28 07/03/2024   ALT 54 (H) 07/03/2024   ANIONGAP 18 (H) 07/03/2024    Assessment and Plan: Intractable nausea and vomiting Odynophagia  Noted intractable nausea and vomiting in setting of nasopharyngeal cancer on active radiation and chemotherapy-followed by Dr. Jacobo outpatient Recurring issue in the setting of radiation and chemotherapy use ?Some component of gastritis as patient reports prior symptomatic improvement with PPI therapy to which she is no longer taking secondary to cost Positive mild epigastric tenderness on palpation Will place on IV PPI IV fluids Antiemetic therapy Pain control   Marijuana use Patient still smoking marijuana Discussed cessation in setting of active nasopharyngeal cancer Patient expressed understanding  GERD (gastroesophageal reflux disease) Minimally compliant with PPI therapy secondary to cost per patient IV PPI with some concern for gastritis Monitor  HTN (hypertension) BP stable Titrate home regimen  Asthma Stable from respiratory  standpoint Continue home inhalers  Hypokalemia K 3.1  Mag 1.9  Replete as appropriate  Follow    Nasopharyngeal carcinoma (HCC) Baseline EBV positive nasopharyngeal cancer followed by Dr. Jacobo outpatient On radiation and chemotherapy Noted secondary odynophagia and intractable nausea and vomiting.  Cont regimen including compazine, zofran , xanax , viscous lidocaine         Advance Care Planning:   Code Status: Full Code   Consults: None   Family Communication: No family at the bedside   Severity of Illness: The appropriate patient status for this patient is OBSERVATION. Observation status is judged to be reasonable and necessary in order to provide the required intensity of service to ensure the patient's safety. The patient's presenting symptoms, physical exam findings, and initial radiographic and laboratory data in the context of their medical condition is felt to place them at decreased risk for further clinical deterioration. Furthermore, it is anticipated that the patient will be medically stable for discharge from the hospital within 2 midnights of admission.   Author: Elspeth JINNY Masters, MD 07/03/2024 10:23 AM  For on call review [email or web address].

## 2024-07-03 NOTE — ED Notes (Signed)
 Called CCMD to initiate cardiac monitoring.

## 2024-07-03 NOTE — Progress Notes (Signed)
 PHARMACIST - PHYSICIAN COMMUNICATION  CONCERNING:  Enoxaparin  (Lovenox ) for DVT Prophylaxis    RECOMMENDATION: Patient was prescribed enoxaprin 40mg  q24 hours for VTE prophylaxis.   Filed Weights   07/03/24 0449  Weight: 91.2 kg (201 lb)    Body mass index is 36.76 kg/m.  Estimated Creatinine Clearance: 104.7 mL/min (by C-G formula based on SCr of 0.84 mg/dL).   Based on [facility] policy patient is candidate for enoxaparin  0.5mg /kg TBW SQ every 24 hours based on BMI being >30.   DESCRIPTION: Pharmacy has adjusted enoxaparin  dose per [facility] policy.  Patient is now receiving enoxaparin  45 mg every 24 hours    Lum VEAR Mania, PharmD Clinical Pharmacist  07/03/2024 7:36 AM

## 2024-07-03 NOTE — ED Notes (Signed)
 Pt BG 69. Pt given ginger ale. Apple juice and orange juice unavailable on the unit.

## 2024-07-03 NOTE — Progress Notes (Signed)
 OT Cancellation Note  Patient Details Name: Brandi [name] MRN: [id number] DOB: [birth year]   Cancelled Treatment:    Reason Eval/Treat Not Completed: OT screened, no needs identified, will sign off. Order received, chart reviewed. Pt reports near baseline functional independence. Denies skilled OT needs and declines therapy assessment, educated on re-consulting if new needs arise. Will sign off.   Elston Slot, M.S. OTR/L  07/03/24, 2:18 PM  ascom [phone]

## 2024-07-03 NOTE — Assessment & Plan Note (Addendum)
 Odynophagia  Noted intractable nausea and vomiting in setting of nasopharyngeal cancer on active radiation and chemotherapy-followed by Dr. Jacobo outpatient PEG tube placement on 11/5.  Still not able to tolerate goal tube feeds. Antiemetic therapy Pain control

## 2024-07-03 NOTE — Assessment & Plan Note (Addendum)
 Blood pressure improved with Norvasc .

## 2024-07-03 NOTE — Assessment & Plan Note (Addendum)
 Patient still smoking marijuana.  Patient can potentially develop cannabis hyperemesis syndrome which could be contributing to symptoms.

## 2024-07-03 NOTE — ED Provider Notes (Signed)
 [facility] Provider Note    Event Date/Time   First MD Initiated Contact with Patient 07/03/24 [phone]     (approximate)   History   Weakness and Dehydration   HPI  Brandi [name] is a 28 y.o. 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  Patient states that she had another round of chemo and radiation a couple of days ago and since then has not been able to tolerate more p.o.  She denies any fevers chest pain or abdominal pain.  States that she is too weak to perform most of her ADLs and feels as if she may be dehydrated.  She presents with her girlfriend who contributes to history  Patient had a similar presentation after chemotherapy and was admitted briefly from 10/27-10 28        Physical Exam   Triage Vital Signs: ED Triage Vitals  Encounter Vitals Group     BP      Girls Systolic BP Percentile      Girls Diastolic BP Percentile      Boys Systolic BP Percentile      Boys Diastolic BP Percentile      Pulse      Resp      Temp      Temp src      SpO2      Weight      Height      Head Circumference      Peak Flow      Pain Score      Pain Loc      Pain Education      Exclude from Growth Chart     Most recent vital signs: Vitals:   07/03/24 0455 07/03/24 0612  BP: (!) 151/103 (!) 145/99  Pulse: 90 89  Resp: 12 13  Temp: 98.1 F (36.7 C)   SpO2: 100% 100%    Nursing Triage Note reviewed. Vital signs reviewed and patients oxygen saturation is normoxic  General: Patient is well nourished, well developed, awake and alert, appears uncomfortable Head: Normocephalic and atraumatic Eyes: Normal inspection, extraocular muscles intact, no conjunctival pallor Ear, nose, throat: Slight swelling and trismus of oral cavity, no tongue edema, floor of  mouth is soft, I do not appreciate any thrush Neck: Fullness but no profound erythema, no crepitus Respiratory: Patient is in no respiratory distress, lungs CTAB Cardiovascular: Patient is not tachycardic, RRR without murmur appreciated GI: Abd SNT with no guarding or rebound  Back: Normal inspection of the back with good strength and range of motion throughout all ext Extremities: pulses intact with good cap refills, no LE pitting edema or calf tenderness Neuro: The patient is alert and oriented to person, place, and time, appropriately conversive, with 5/5 bilat UE/LE strength, no gross motor or sensory defects noted. Coordination appears to be adequate. Skin: Warm, dry, and intact Psych: normal mood and affect, no SI or HI  ED Results / Procedures / Treatments   Labs (all labs ordered are listed, but only abnormal results are displayed) Labs Reviewed  CBC WITH DIFFERENTIAL/PLATELET - Abnormal; Notable for the following components:      Result Value   WBC 3.6 (*)    Hemoglobin 11.7 (*)    HCT 35.1 (*)    Lymphs Abs 0.3 (*)    All other components within  normal limits  COMPREHENSIVE METABOLIC PANEL WITH GFR - Abnormal; Notable for the following components:   Sodium 133 (*)    Potassium 3.1 (*)    Chloride 95 (*)    CO2 20 (*)    Total Protein 8.2 (*)    ALT 54 (*)    Anion gap 18 (*)    All other components within normal limits  MAGNESIUM   URINALYSIS, COMPLETE (UACMP) WITH MICROSCOPIC     EKG EKG and rhythm strip are interpreted by myself:   EKG: [Normal sinus rhythm] at heart rate of 92, normal QRS duration, QTc 433, nonspecific ST segments and T waves no ectopy EKG not consistent with Acute STEMI Rhythm strip: NSR in lead II   RADIOLOGY Xray chest: No acute abnormality on my independent review interpretation radiologist agrees    PROCEDURES:  Critical Care performed: No  Procedures   MEDICATIONS ORDERED IN ED: Medications  lactated ringers  bolus 1,000 mL  (1,000 mLs Intravenous New Bag/Given 07/03/24 0537)  morphine  (PF) 4 MG/ML injection 4 mg (4 mg Intravenous Given 07/03/24 0533)  ondansetron  (ZOFRAN ) injection 4 mg (4 mg Intravenous Given 07/03/24 0533)  potassium chloride  SA (KLOR-CON  M) CR tablet 40 mEq (40 mEq Oral Given 07/03/24 0637)  morphine  (PF) 4 MG/ML injection 4 mg (4 mg Intravenous Given 07/03/24 9361)     IMPRESSION / MDM / ASSESSMENT AND PLAN / ED COURSE                                Differential diagnosis includes, but is not limited to, chemotherapy reaction, acute renal insufficiency, electrolyte derangement, anemia, aspiration, UTI   ED course: Patient appears highly uncomfortable and upon presentation was given IV fluids, Zofran  and morphine .  Blood work notable for low potassium which was repleted.  She was not profoundly neutropenic and chest x-ray demonstrated no aspiration.  She did have a very mildly elevated anion gap and she did receive some IV fluids.  Patient feels uncomfortable returning home today and has not been able to tolerate p.o. in our facility.  She has required several doses of pain medication.  Will discuss observation admission with hospitalist on-call   Clinical Course as of 07/03/24 0733  Mon Jul 03, 2024  0611 WBC(!): 3.6 Not neutropenic [HD]  0631 Potassium(!): 3.1 Will replete [HD]  0731 Case discussed with hospitalist for admission [HD]    Clinical Course User Index [HD] Nicholaus Rolland BRAVO, MD   -- Risk: 5 This patient has a high risk of morbidity due to further diagnostic testing or treatment. Rationale: This patient's evaluation and management involve a high risk of morbidity due to the potential severity of presenting symptoms, need for diagnostic testing, and/or initiation of treatment that may require close monitoring. The differential includes conditions with potential for significant deterioration or requiring escalation of care. Treatment decisions in the ED, including medication  administration, procedural interventions, or disposition planning, reflect this level of risk. COPA: 5 The patient has the following acute or chronic illness/injury that poses a possible threat to life or bodily function: [X] : The patient has a potentially serious acute condition or an acute exacerbation of a chronic illness requiring urgent evaluation and management in the Emergency Department. The clinical presentation necessitates immediate consideration of life-threatening or function-threatening diagnoses, even if they are ultimately ruled out.   FINAL CLINICAL IMPRESSION(S) / ED DIAGNOSES   Final diagnoses:  Intractable pain  Hypokalemia  Adverse  effect of chemotherapy, subsequent encounter     Rx / DC Orders   ED Discharge Orders     None        Note:  This document was prepared using Dragon voice recognition software and may include unintentional dictation errors.   Nicholaus Rolland BRAVO, MD 07/03/24 [phone]

## 2024-07-04 ENCOUNTER — Other Ambulatory Visit: Payer: Self-pay

## 2024-07-04 ENCOUNTER — Ambulatory Visit

## 2024-07-04 ENCOUNTER — Encounter: Payer: Self-pay | Admitting: Family Medicine

## 2024-07-04 DIAGNOSIS — C770 Secondary and unspecified malignant neoplasm of lymph nodes of head, face and neck: Secondary | ICD-10-CM | POA: Insufficient documentation

## 2024-07-04 DIAGNOSIS — C01 Malignant neoplasm of base of tongue: Secondary | ICD-10-CM | POA: Insufficient documentation

## 2024-07-04 DIAGNOSIS — E876 Hypokalemia: Secondary | ICD-10-CM | POA: Insufficient documentation

## 2024-07-04 DIAGNOSIS — J45909 Unspecified asthma, uncomplicated: Secondary | ICD-10-CM | POA: Insufficient documentation

## 2024-07-04 DIAGNOSIS — E860 Dehydration: Secondary | ICD-10-CM | POA: Diagnosis not present

## 2024-07-04 DIAGNOSIS — K219 Gastro-esophageal reflux disease without esophagitis: Secondary | ICD-10-CM | POA: Insufficient documentation

## 2024-07-04 DIAGNOSIS — I10 Essential (primary) hypertension: Secondary | ICD-10-CM | POA: Insufficient documentation

## 2024-07-04 DIAGNOSIS — M549 Dorsalgia, unspecified: Secondary | ICD-10-CM | POA: Insufficient documentation

## 2024-07-04 DIAGNOSIS — R1310 Dysphagia, unspecified: Secondary | ICD-10-CM | POA: Insufficient documentation

## 2024-07-04 DIAGNOSIS — J320 Chronic maxillary sinusitis: Secondary | ICD-10-CM | POA: Insufficient documentation

## 2024-07-04 DIAGNOSIS — Z510 Encounter for antineoplastic radiation therapy: Secondary | ICD-10-CM | POA: Insufficient documentation

## 2024-07-04 DIAGNOSIS — C119 Malignant neoplasm of nasopharynx, unspecified: Secondary | ICD-10-CM | POA: Insufficient documentation

## 2024-07-04 DIAGNOSIS — Z9221 Personal history of antineoplastic chemotherapy: Secondary | ICD-10-CM | POA: Insufficient documentation

## 2024-07-04 DIAGNOSIS — Z5111 Encounter for antineoplastic chemotherapy: Secondary | ICD-10-CM | POA: Insufficient documentation

## 2024-07-04 DIAGNOSIS — D72829 Elevated white blood cell count, unspecified: Secondary | ICD-10-CM | POA: Insufficient documentation

## 2024-07-04 DIAGNOSIS — R110 Nausea: Secondary | ICD-10-CM | POA: Insufficient documentation

## 2024-07-04 DIAGNOSIS — E871 Hypo-osmolality and hyponatremia: Secondary | ICD-10-CM | POA: Insufficient documentation

## 2024-07-04 DIAGNOSIS — D649 Anemia, unspecified: Secondary | ICD-10-CM | POA: Insufficient documentation

## 2024-07-04 DIAGNOSIS — Z923 Personal history of irradiation: Secondary | ICD-10-CM | POA: Insufficient documentation

## 2024-07-04 DIAGNOSIS — Z79899 Other long term (current) drug therapy: Secondary | ICD-10-CM | POA: Insufficient documentation

## 2024-07-04 DIAGNOSIS — R630 Anorexia: Secondary | ICD-10-CM | POA: Insufficient documentation

## 2024-07-04 DIAGNOSIS — D75839 Thrombocytosis, unspecified: Secondary | ICD-10-CM | POA: Insufficient documentation

## 2024-07-04 DIAGNOSIS — K5900 Constipation, unspecified: Secondary | ICD-10-CM | POA: Insufficient documentation

## 2024-07-04 DIAGNOSIS — Z87891 Personal history of nicotine dependence: Secondary | ICD-10-CM | POA: Insufficient documentation

## 2024-07-04 LAB — COMPREHENSIVE METABOLIC PANEL WITH GFR
ALT: 42 U/L (ref 0–44)
AST: 22 U/L (ref 15–41)
Albumin: 3.5 g/dL (ref 3.5–5.0)
Alkaline Phosphatase: 48 U/L (ref 38–126)
Anion gap: 9 (ref 5–15)
BUN: 7 mg/dL (ref 6–20)
CO2: 25 mmol/L (ref 22–32)
Calcium: 9.3 mg/dL (ref 8.9–10.3)
Chloride: 103 mmol/L (ref 98–111)
Creatinine, Ser: 0.71 mg/dL (ref 0.44–1.00)
GFR, Estimated: >60 mL/min (ref >60)
Glucose, Bld: 88 mg/dL (ref 70–99)
Potassium: 3.6 mmol/L (ref 3.5–5.1)
Sodium: 137 mmol/L (ref 135–145)
Total Bilirubin: 0.9 mg/dL (ref 0.0–1.2)
Total Protein: 6.5 g/dL (ref 6.5–8.1)

## 2024-07-04 LAB — CBC
HCT: 31.7 % — ABNORMAL LOW (ref 36.0–46.0)
Hemoglobin: 10.6 g/dL — ABNORMAL LOW (ref 12.0–15.0)
MCH: 29.6 pg (ref 26.0–34.0)
MCHC: 33.4 g/dL (ref 30.0–36.0)
MCV: 88.5 fL (ref 80.0–100.0)
Platelets: 332 K/uL (ref 150–400)
RBC: 3.58 MIL/uL — ABNORMAL LOW (ref 3.87–5.11)
RDW: 13.6 % (ref 11.5–15.5)
WBC: 2.9 K/uL — ABNORMAL LOW (ref 4.0–10.5)
nRBC: 0 % (ref 0.0–0.2)

## 2024-07-04 LAB — RAD ONC ARIA SESSION SUMMARY
Course Elapsed Days: 21
Plan Fractions Treated to Date: 12
Plan Prescribed Dose Per Fraction: 2 Gy
Plan Total Fractions Prescribed: 35
Plan Total Prescribed Dose: 70 Gy
Reference Point Dosage Given to Date: 24 Gy
Reference Point Session Dosage Given: 2 Gy
Session Number: 12

## 2024-07-04 MED ORDER — CEFAZOLIN SODIUM-DEXTROSE 2-4 GM/100ML-% IV SOLN
2.0000 g | Freq: Once | INTRAVENOUS | Status: AC
  Administered 2024-07-05: 2 g via INTRAVENOUS
  Filled 2024-07-04 (×4): qty 100

## 2024-07-04 NOTE — Progress Notes (Signed)
 Pt has returned from Radiation. Pt is A&Ox4.

## 2024-07-04 NOTE — Progress Notes (Signed)
   07/04/24 1600  Spiritual Encounters  Type of Visit Initial  Care provided to: Patient  Referral source Nurse (RN/NT/LPN)  Reason for visit Routine spiritual support  OnCall Visit No  Interventions  Spiritual Care Interventions Made Established relationship of care and support;Compassionate presence  Intervention Outcomes  Outcomes Connection to spiritual care   Chaplain provided compassionate care to patient. Chaplain let patient know there was a request for a visit in the system. Patient said they did not ask for a visit but thank you for coming. Chaplain shared with patient if you ever want a Chaplain to come just let staff know to call.

## 2024-07-04 NOTE — Progress Notes (Signed)
 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  She follows with oncology outpatient and has had prior admissions for similar problems.  Radiation has been causing significant pain, difficulty swallowing, and decreased taste.  Patient reports minimal p.o. intake.  She reports she has decreased energy, and is feeling weaker every day.  She has discussed possible PEG tube outpatient with the nutritionist and oncology team but has been fearful to initiate this as she thought it would spread her malignancy.  After further discussion today she is more amendable and would like to plan for G-tube placement now with IR.  Subjective: This morning patient reports her nausea is resolved.  She reports Zofran  is keeping her symptoms under control.  She still complains of significant secretions and is using the hand-held suction for this. When discussing her intake she reports that she has been unable to eat anything for many weeks.  She occasionally eats ice chips and water but even that is causing pain.  She reports she has tried Ensure at home but this is cost prohibitive and she ultimately vomits whenever she eats. She reports the oncology and nutritionist teams outpatient have discussed PEG tube with her before but she has been hesitant to proceed.  We discussed her concerns.  She is now more amendable.  She understands that given her current level of intake she does not have nutritional reserve to sustain chemotherapy and continue to fight her cancer.  She is interested in speaking with the interventional radiology team and proceeding with G-tube  placement.  Objective: Vitals:   07/04/24 0008 07/04/24 0526 07/04/24 0733 07/04/24 1147  BP: 136/84 (!) 141/103 (!) 134/94 (!) 145/114  Pulse: 85 84 84 77  Resp:  16 16 16   Temp: 97.9 F (36.6 C) 98.1 F (36.7 C) (!) 97.5 F (36.4 C) 99.1 F (37.3 C)  TempSrc: Oral Oral Oral Oral  SpO2: 99% 100% 99% 99%  Weight:      Height:        Intake/Output Summary (Last 24 hours) at 07/04/2024 1256 Last data filed at 07/04/2024 0617 Gross per 24 hour  Intake 1377.27 ml  Output --  Net 1377.27 ml   Filed Weights   07/03/24 0449  Weight: 91.2 kg    Examination: General exam: Appears calm and comfortable, NAD  Respiratory system: No work of breathing, symmetric chest wall expansion Cardiovascular system: S1 & S2 heard, RRR.  Gastrointestinal system: Abdomen is nondistended, soft and nontender.  Neuro: Alert and oriented. No focal neurological deficits. Extremities: Symmetric, expected ROM Skin: No rashes, lesions Psychiatry: Demonstrates appropriate judgement and insight. Mood & affect appropriate for situation.   Assessment & Plan:  Principal Problem:   Dehydration Active Problems:   Intractable nausea and vomiting   Marijuana use   HTN (hypertension)   GERD (gastroesophageal reflux disease)   Asthma   Hypokalemia   Nasopharyngeal carcinoma (HCC)    Nausea and vomiting Odynophagia Poor oral intake - In the setting of nasopharyngeal cancer on active radiation and chemotherapy.  May also have some gastritis component as she does have some improvement with PPI - Patient has an additional 6 weeks of radiation planned.  Her p.o. intake has significantly decreased.  She reports some days she does not eat anything at all - Interventional radiology consulted for G-tube placement.  She will require insufflation via NG tube at time of procedure.  IR to discuss this directly with the patient - Patient consenting to G-tube now.  She desires greater nutritional reserve.  She  understands that she will still be able to take p.o. as much as desired or tolerated.  She also understands G-tube will need to stay in place for at least 6 weeks - Comfort feeds for now.  Patient is refusing most pills and supplements  Nasopharyngeal carcinoma - EBV positive nasopharyngeal cancer followed by Dr. Jacobo outpatient - Currently on radiation and chemotherapy - Continue home meds at this time - G-tube placement as above  Marijuana use - Patient still actively smoking marijuana.  Was counseled on cessation  GERD - Continue with IV PPI for now.  Patient has been minimally compliant with PPI therapy outpatient due to cost  Hypertension - Continue home meds, titrate as needed  Asthma, not currently in exacerbation - Continue home inhalers  Chronic anemia Leukopenia - Secondary to chemotherapy as above.  Stable.  Hypokalemia - Replace as needed  DVT prophylaxis: Lovenox    Code Status: Full Code Disposition:  Obs pending G-tube tomorrow  Consultants:    Procedures:    Antimicrobials:  Anti-infectives (From admission, onward)    Start     Dose/Rate Route Frequency Ordered Stop   07/05/24 1200  ceFAZolin (ANCEF) IVPB 2g/100 mL premix        2 g 200 mL/hr over 30 Minutes Intravenous  Once 07/04/24 1254         Data Reviewed: I have personally reviewed following labs and imaging studies CBC: Recent Labs  Lab 06/29/24 0836 07/03/24 0525 07/04/24 0435  WBC 3.4* 3.6* 2.9*  NEUTROABS 2.4 2.8  --   HGB 11.4* 11.7* 10.6*  HCT 33.9* 35.1* 31.7*  MCV 87.4 87.5 88.5  PLT 440* 383 332   Basic Metabolic Panel: Recent Labs  Lab 06/29/24 0836 07/03/24 0525 07/03/24 2236 07/04/24 0435  NA 134* 133*  --  137  K 3.3* 3.1* 3.7 3.6  CL 102 95*  --  103  CO2 21* 20*  --  25  GLUCOSE 106* 97  --  88  BUN 10 13  --  7  CREATININE 0.85 0.84  --  0.71  CALCIUM 9.2 9.3  --  9.3  MG 1.9 1.9  --   --    GFR: Estimated Creatinine Clearance: 109.9 mL/min (by  C-G formula based on SCr of 0.71 mg/dL). Liver Function Tests: Recent Labs  Lab 07/03/24 0525 07/04/24 0435  AST 28 22  ALT 54* 42  ALKPHOS 54 48  BILITOT 1.0 0.9  PROT 8.2* 6.5  ALBUMIN 4.0 3.5   CBG: Recent Labs  Lab 07/03/24 1139 07/03/24 1405  GLUCAP 69* 85    No results found for this or any previous visit (from the past 240 hours).   Radiology Studies: DG Chest 1 View Result Date: 07/03/2024 EXAM: 1 VIEW(S) XRAY OF THE CHEST 07/03/2024 05:10:42 AM COMPARISON: PA and lateral radiographs of the chest dated 10/31/2012. CLINICAL HISTORY: worsening weakness FINDINGS: LINES, TUBES AND DEVICES: Monitor wires noted. LUNGS AND PLEURA: No focal pulmonary opacity. No pulmonary edema. No pleural effusion. No pneumothorax. HEART AND MEDIASTINUM: No acute abnormality of the cardiac and mediastinal silhouettes. BONES AND SOFT TISSUES: No acute osseous abnormality. IMPRESSION: 1.  No acute process. Electronically signed by: Evalene Coho MD 07/03/2024 05:20 AM EST RP Workstation: [id number]    Scheduled Meds:  (feeding supplement) PROSource Plus  30 mL Oral TID BM   Drospirenone   1 tablet Oral Daily   enoxaparin  (LOVENOX ) injection  45 mg Subcutaneous Q24H   multivitamin with minerals  1 tablet Oral Daily   pantoprazole  (PROTONIX ) IV  40 mg Intravenous Q12H   senna-docusate  1 tablet Oral BID   Continuous Infusions:  [START ON 07/05/2024]  ceFAZolin (ANCEF) IV       LOS: 0 days  MDM: Patient is high risk for one or more organ failure.  They necessitate ongoing hospitalization for continued IV therapies and subsequent lab monitoring. Total time spent interpreting labs and vitals, reviewing the medical record, coordinating care amongst consultants and care team members, directly assessing and discussing care with the patient and/or family: 55 min  Xaidyn Kepner, DO [facility]  To contact the attending physician between 7A-7P please use Epic Chat. To contact the covering  physician during after hours 7P-7A, please review Amion.  07/04/2024, 12:56 PM   *This document has been created with the assistance of dictation software. Please excuse typographical errors. *

## 2024-07-04 NOTE — Consult Note (Shared)
 Chief Complaint: image guided gastrostomy tube placement  Referring Provider(s): Leesa Kast, DO  Supervising Physician: Jenna Hacker  Patient Status: [facility] - In-pt  History of Present Illness: Brandi [name] is a 28 y.o. 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 PET scan done on 05/26/2024 confirming malignancy. Patient presented to the ED on 07/03/2024 with complaints of intractable nausea and vomiting. She reports inability to tolerate PO and burning/soreness to her throat. Last chemo was on 06/29/2024. She was recently admitted from 06/26/2024-06/27/2024 for a similar presentation after chemotherapy. She sees Dr Jacobo for oncology treatment. Received request for image guided gastrostomy tube placement.  Confirms NPO since [facility] *** She does not wear CPAP or use supplemental home O2.  Denies fever, chills, SHOB, CP, sore throat, N/V, abd pain, blood in stool or urine, abnormal bruising, leg swelling, back pain.   Allergies Reviewed:  Shellfish allergy and Tomato   Patient is Full Code  Past Medical History:  Diagnosis Date   Asthma    Cancer (HCC)    GERD (gastroesophageal reflux disease)    HTN (hypertension)     Past Surgical History:  Procedure Laterality Date   dental procedure     IR US  LIVER BIOPSY  05/17/2024      Medications: Prior to Admission medications   Medication Sig Start Date End Date Taking? Authorizing Provider  acetaminophen  (TYLENOL ) 325 MG tablet Take 2 tablets (650 mg total) by mouth every 6 (six) hours as needed for mild pain (pain score 1-3) or fever (or Fever >/= 101). 05/19/24  Yes Fausto Sor A, DO  ALPRAZolam  (XANAX ) 0.25 MG tablet Take 1 tablet (0.25 mg total) by mouth at bedtime as needed for anxiety. 06/15/24  Yes [name], MD  cyclobenzaprine  (FLEXERIL ) 10 MG tablet Take 1 tablet (10 mg  total) by mouth 3 (three) times daily as needed for muscle spasms. 06/14/24  Yes [name], MD  HYDROcodone -acetaminophen  (NORCO/VICODIN) 5-325 MG tablet Take 1 tablet by mouth every 6 (six) hours as needed for moderate pain (pain score 4-6). 06/18/24  Yes Floy Roberts, MD  lidocaine  (XYLOCAINE ) 2 % solution Use as directed 15 mLs in the mouth or throat every 3 (three) hours as needed for mouth pain. swish and swallow. 06/27/24  Yes Awanda City, MD  ondansetron  (ZOFRAN ) 8 MG tablet Take 1 tablet (8 mg total) by mouth every 8 (eight) hours as needed for nausea or vomiting. Start on the third day after cisplatin. 05/30/24  Yes [name], MD  pantoprazole  (PROTONIX ) 40 MG tablet Take 1 tablet (40 mg total) by mouth daily. 04/18/24 04/18/25 Yes Bradler, Evan K, MD  phenol (CHLORASEPTIC) 1.4 % LIQD Use as directed 1 spray in the mouth or throat as needed for throat irritation / pain. 06/27/24  Yes Awanda City, MD  prochlorperazine (COMPAZINE) 10 MG tablet Take 1 tablet (10 mg total) by mouth every 6 (six) hours as needed (Nausea or vomiting). 05/30/24  Yes [name], MD  senna-docusate (SENOKOT-S) 8.6-50 MG tablet Take 1 tablet by mouth 2 (two) times daily. 05/19/24  Yes Fausto Sor A, DO  SLYND  4 MG TABS Take 1 tablet by mouth daily. 05/20/24  Yes [provider]  gentamicin ointment (GARAMYCIN) 0.1 % Apply 1 Application topically 3 (three) times daily. Patient not taking: Reported on 07/03/2024 05/22/24   [provider]  Family History  Problem Relation Age of Onset   Hypertension Maternal Grandmother    Diabetes Maternal Grandmother     Social History   Socioeconomic History   Marital status: Significant Other    Spouse name: Not on file   Number of children: Not on file   Years of education: Not on file   Highest education level: Not on file  Occupational History   Not on file  Tobacco Use   Smoking status: Former    Types: E-cigarettes    Smokeless tobacco: Never  Vaping Use   Vaping status: Former  Substance and Sexual Activity   Alcohol use: Not Currently    Comment: social   Drug use: Yes    Types: Marijuana   Sexual activity: Yes    Partners: Female    Birth control/protection: None  Other Topics Concern   Not on file  Social History Narrative   Not on file   Social Drivers of Health   Financial Resource Strain: Not on file  Food Insecurity: Patient Declined (07/03/2024)   Hunger Vital Sign    Worried About Running Out of Food in the Last Year: Patient declined    Ran Out of Food in the Last Year: Patient declined  Transportation Needs: Patient Declined (07/03/2024)   PRAPARE - Administrator, Civil Service (Medical): Patient declined    Lack of Transportation (Non-Medical): Patient declined  Physical Activity: Not on file  Stress: Not on file  Social Connections: Patient Declined (07/03/2024)   Social Connection and Isolation Panel    Frequency of Communication with Friends and Family: Patient declined    Frequency of Social Gatherings with Friends and Family: Patient declined    Attends Religious Services: Patient declined    Database Administrator or Organizations: Patient declined    Attends Banker Meetings: Patient declined    Marital Status: Patient declined     Review of Systems: A 12 point ROS discussed and pertinent positives are indicated in the HPI above.  All other systems are negative.    Vital Signs: BP (!) 145/114 (BP Location: Left Arm)   Pulse 77   Temp 99.1 F (37.3 C) (Oral)   Resp 16   Ht 5' 2 (1.575 m)   Wt 201 lb (91.2 kg)   SpO2 99%   BMI 36.76 kg/m     Physical Exam  Imaging: DG Chest 1 View Result Date: 07/03/2024 EXAM: 1 VIEW(S) XRAY OF THE CHEST 07/03/2024 05:10:42 AM COMPARISON: PA and lateral radiographs of the chest dated 10/31/2012. CLINICAL HISTORY: worsening weakness FINDINGS: LINES, TUBES AND DEVICES: Monitor wires noted. LUNGS  AND PLEURA: No focal pulmonary opacity. No pulmonary edema. No pleural effusion. No pneumothorax. HEART AND MEDIASTINUM: No acute abnormality of the cardiac and mediastinal silhouettes. BONES AND SOFT TISSUES: No acute osseous abnormality. IMPRESSION: 1. No acute process. Electronically signed by: Evalene Coho MD 07/03/2024 05:20 AM EST RP Workstation: [id number]   CT ABDOMEN PELVIS W CONTRAST Result Date: 06/18/2024 EXAM: CT ABDOMEN AND PELVIS WITH CONTRAST 06/18/2024 12:25:32 PM TECHNIQUE: CT of the abdomen and pelvis was performed with the administration of 100 mL of iohexol  (OMNIPAQUE ) 300 MG/ML solution. Multiplanar reformatted images are provided for review. Automated exposure control, iterative reconstruction, and/or weight-based adjustment of the mA/kV was utilized to reduce the radiation dose to as low as reasonably achievable. COMPARISON: PET CT fusion study dated 05/26/2024 and CT of the abdomen and pelvis dated 04/05/2022. CLINICAL HISTORY: left lower  back/abdominal pain. Pt comes with c/o back pain and some lower belly pain. Pt states she had 1st round of chemo on Wed and radiation everyday except the weekend. Pt states since that her back has been hurting. Pt states she thinks they didn't flush her out good enough. Pt states she only got two bags of fluids and it was suppose to be 4. FINDINGS: LOWER CHEST: No acute abnormality. LIVER: The liver is unremarkable. GALLBLADDER AND BILE DUCTS: Gallbladder is unremarkable. No biliary ductal dilatation. SPLEEN: No acute abnormality. PANCREAS: No acute abnormality. ADRENAL GLANDS: No acute abnormality. KIDNEYS, URETERS AND BLADDER: No stones in the kidneys or ureters. No hydronephrosis. No perinephric or periureteral stranding. Urinary bladder is unremarkable. GI AND BOWEL: There are few scattered colonic diverticula. Stomach demonstrates no acute abnormality. There is no bowel obstruction. PERITONEUM AND RETROPERITONEUM: No ascites. No free air.  VASCULATURE: Aorta is normal in caliber. LYMPH NODES: No lymphadenopathy. REPRODUCTIVE ORGANS: No acute abnormality. BONES AND SOFT TISSUES: No acute osseous abnormality. No focal soft tissue abnormality. IMPRESSION: 1. No acute findings in the abdomen or pelvis. 2. Scattered colonic diverticula without evidence of diverticulitis. Electronically signed by: Evalene Coho MD 06/18/2024 12:55 PM EDT RP Workstation: [id number]    Labs:  CBC: Recent Labs    06/26/24 0706 06/29/24 0836 07/03/24 0525 07/04/24 0435  WBC 3.9* 3.4* 3.6* 2.9*  HGB 11.8* 11.4* 11.7* 10.6*  HCT 35.3* 33.9* 35.1* 31.7*  PLT 510* 440* 383 332    COAGS: Recent Labs    04/20/24 0914 05/17/24 0440  INR 1.1 1.1    BMP: Recent Labs    06/27/24 0417 06/29/24 0836 07/03/24 0525 07/03/24 2236 07/04/24 0435  NA 132* 134* 133*  --  137  K 4.7 3.3* 3.1* 3.7 3.6  CL 101 102 95*  --  103  CO2 20* 21* 20*  --  25  GLUCOSE 109* 106* 97  --  88  BUN 11 10 13   --  7  CALCIUM 9.1 9.2 9.3  --  9.3  CREATININE 0.73 0.85 0.84  --  0.71  GFRNONAA >60 >60 >60  --  >60    LIVER FUNCTION TESTS: Recent Labs    06/21/24 0911 06/26/24 0706 07/03/24 0525 07/04/24 0435  BILITOT 0.7 1.0 1.0 0.9  AST 18 39 28 22  ALT 20 47* 54* 42  ALKPHOS 64 56 54 48  PROT 7.9 8.3* 8.2* 6.5  ALBUMIN 3.5 3.7 4.0 3.5    TUMOR MARKERS: No results for input(s): AFPTM, CEA, CA199, CHROMGRNA in the last 8760 hours.  Assessment and Plan: Stage III EBV positive nasopharyngeal cancer-   Request for image guided gastrostomy tube placement.  No contraindications for procedure identified in ROS, physical exam, or review of pre-sedation considerations.  Labs reviewed and within acceptable range Imaging available and reviewed by Dr Jenna VSS, afebrile Will hold Lovenox  pre-procedure Abx indicated - cefazolin Order for NPO at [facility]   Risks and benefits image guided gastrostomy tube placement was discussed with the patient  including, but not limited to the need for a barium enema during the procedure, bleeding, infection, peritonitis and/or damage to adjacent structures.  All of the patient's questions were answered, patient is agreeable to proceed.  Consent signed and in chart.   Thank you for allowing our service to participate in Brandi [name] 's care.    Electronically Signed: Arris Meyn B Vincenzina Jagoda, NP   07/04/2024, 12:07 PM     I spent a total of 40 Minutes  in face to face in clinical consultation, greater than 50% of which was counseling/coordinating care for image guided gastrostomy tube placement.    (A copy of this note was sent to the referring provider and the time of visit.)

## 2024-07-04 NOTE — TOC CM/SW Note (Signed)
 Transition of Care [facility]) - Inpatient Brief Assessment   Patient Details  Name: Evalisse Prajapati MRN: [id number] Date of Birth: 1996-01-21  [facility] of [facility]) CM/SW Contact:    Daved JONETTA Hamilton, RN Phone Number: 07/04/2024, 9:00 AM   Clinical Narrative:   Transition of Care [facility]) Screening Note   Patient Details  Name: Dyamon Sosinski Date of Birth: 13-Jan-1996   Transition of Care [facility]) CM/SW Contact:    Daved JONETTA Hamilton, RN Phone Number: 07/04/2024, 9:00 AM    Transition of Care Department [facility]) has reviewed patient and no TOC needs have been identified at this time.  If new patient transition needs arise, please place a TOC consult.    Transition of Care Asessment: Insurance and Status: Insurance coverage has been reviewed Patient has primary care physician: Yes   Prior level of function:: Independent Prior/Current Home Services: No current home services Social Drivers of Health Review: SDOH reviewed no interventions necessary Readmission risk has been reviewed: No (patient in observation status, no score generated) Transition of care needs: no transition of care needs at this time

## 2024-07-04 NOTE — Progress Notes (Signed)
 Initial Nutrition Assessment  DOCUMENTATION CODES:   Obesity unspecified  INTERVENTION:   -Once PEG is placed and cleared for use:  237 ml Osmolite 1.5 6 times daily  75 ml free water flush before and after each feeding administration  Tube feeding regimen provides 2130 kcal (100% of needs), 89 grams of protein, and 1086 ml of H2O.  Total free water: 1986 ml daily  -Start with half a can daily and increase to full can next day  -Once feedings are initiated:  -MVI with minerals daily via tube -100 mg thiamine daily via tube x 7 days -Monitor Mg, K, and Phos and replete as needed secondary to high refeeding risk -Consult to transitions of care for arrangement for home tube feedings  -Spiritual care consult placed for added layer of support  NUTRITION DIAGNOSIS:   Increased nutrient needs related to cancer and cancer related treatments as evidenced by estimated needs.  GOAL:   Patient will meet greater than or equal to 90% of their needs  MONITOR:   Diet advancement, TF tolerance  REASON FOR ASSESSMENT:   Consult Assessment of nutrition requirement/status, Enteral/tube feeding initiation and management  ASSESSMENT:   28 year old female with stage III EBV positive nasopharyngeal cancer on chemo and radiation (diagnosed August 2025), hypertension, asthma, obesity, who presents with nausea and vomiting.  She follows with oncology outpatient and has had prior admissions for similar problems.  Radiation has been causing significant pain, difficulty swallowing, and decreased taste.  Patient admitted with with nausea, vomiting, odynophagia, poor oral intake, and nasopharyngeal carcinoma.   Reviewed I/O's: +1.4 L x 24 hours   Patient is familiar to this RD due to recent prior admission. She is followed by RD at cancer center for nasopharyngeal carcinoma, currently receiving chemotherapy and radiation.   Spoke with patient at bedside, who recognized RD from prior admission.  She shares intake has continued to decline over the past week. She reports only consuming some supplements, mostly Ensure Clear. Patient states she tried to drink a frozen Ensure Clear (like a slushie) yesterday and started vomiting, which concerned her and prompted hospitalization.   Reviewed weight history; patient has experienced a 7.3% weight loss over the past 3 months, which is not significant for time frame, however, concerning given poor oral intake and increased needs for cancer treatments.   Patient confirmed plan to place PEG tomorrow. She confirms that this is the right decision for her and knows that she needs good nutrition to support further cancer treatments. Assured patient that she could continue to eat and drink for comfort and the possibility of removing PEG in the future as swallow function and oral intake improve. Emotional support provided.   Case discussed with cancer center RD (Joli); provided updates on plan of care.   Case discussed with MD, RN, and transitions of care regarding recommendations and care plan.   Medications reviewed and include lovenox , protonix , and senokot.   Labs reviewed: CBGS: 69-85 (inpatient orders for glycemic control are none).    NUTRITION - FOCUSED PHYSICAL EXAM:  Flowsheet Row Most Recent Value  Orbital Region No depletion  Upper Arm Region Mild depletion  Thoracic and Lumbar Region No depletion  Buccal Region No depletion  Temple Region No depletion  Clavicle Bone Region No depletion  Clavicle and Acromion Bone Region No depletion  Scapular Bone Region No depletion  Dorsal Hand No depletion  Patellar Region No depletion  Anterior Thigh Region No depletion  Posterior Calf Region No depletion  Edema (  RD Assessment) None  Hair Reviewed  Eyes Reviewed  Mouth Reviewed  Skin Reviewed  Nails Reviewed    Diet Order:   Diet Order             Diet NPO time specified Except for: Sips with Meds  Diet effective midnight            Diet regular Room service appropriate? Yes; Fluid consistency: Thin  Diet effective now                   EDUCATION NEEDS:   Education needs have been addressed  Skin:  Skin Assessment: Reviewed RN Assessment  Last BM:  07/03/24  Height:   Ht Readings from Last 1 Encounters:  07/03/24 5' 2 (1.575 m)    Weight:   Wt Readings from Last 1 Encounters:  07/03/24 91.2 kg    Ideal Body Weight:  50 kg  BMI:  Body mass index is 36.76 kg/m.  Estimated Nutritional Needs:   Kcal:  1750-1950  Protein:  100-115 grams  Fluid:  1.7-1.9 L    Margery ORN, RD, LDN, CDCES Registered Dietitian III Certified Diabetes Care and Education Specialist If unable to reach this RD, please use RD Inpatient group chat on secure chat between hours of 8am-4 pm daily

## 2024-07-04 NOTE — Plan of Care (Signed)

## 2024-07-04 NOTE — Progress Notes (Signed)
 Pt is off unit for Radiation. Pt is stable at this time.

## 2024-07-05 ENCOUNTER — Ambulatory Visit

## 2024-07-05 ENCOUNTER — Observation Stay: Admitting: Speech Pathology

## 2024-07-05 ENCOUNTER — Observation Stay: Admitting: Radiology

## 2024-07-05 ENCOUNTER — Other Ambulatory Visit

## 2024-07-05 ENCOUNTER — Ambulatory Visit: Admitting: Oncology

## 2024-07-05 ENCOUNTER — Other Ambulatory Visit: Payer: Self-pay

## 2024-07-05 DIAGNOSIS — T50995A Adverse effect of other drugs, medicaments and biological substances, initial encounter: Secondary | ICD-10-CM | POA: Diagnosis present

## 2024-07-05 DIAGNOSIS — K5909 Other constipation: Secondary | ICD-10-CM | POA: Diagnosis present

## 2024-07-05 DIAGNOSIS — Z87891 Personal history of nicotine dependence: Secondary | ICD-10-CM | POA: Diagnosis not present

## 2024-07-05 DIAGNOSIS — D72819 Decreased white blood cell count, unspecified: Secondary | ICD-10-CM | POA: Diagnosis present

## 2024-07-05 DIAGNOSIS — E876 Hypokalemia: Secondary | ICD-10-CM | POA: Diagnosis present

## 2024-07-05 DIAGNOSIS — Z833 Family history of diabetes mellitus: Secondary | ICD-10-CM | POA: Diagnosis not present

## 2024-07-05 DIAGNOSIS — D6481 Anemia due to antineoplastic chemotherapy: Secondary | ICD-10-CM | POA: Diagnosis present

## 2024-07-05 DIAGNOSIS — Z6837 Body mass index (BMI) 37.0-37.9, adult: Secondary | ICD-10-CM | POA: Diagnosis not present

## 2024-07-05 DIAGNOSIS — K219 Gastro-esophageal reflux disease without esophagitis: Secondary | ICD-10-CM | POA: Diagnosis present

## 2024-07-05 DIAGNOSIS — I10 Essential (primary) hypertension: Secondary | ICD-10-CM | POA: Diagnosis present

## 2024-07-05 DIAGNOSIS — T451X5A Adverse effect of antineoplastic and immunosuppressive drugs, initial encounter: Secondary | ICD-10-CM | POA: Diagnosis present

## 2024-07-05 DIAGNOSIS — G893 Neoplasm related pain (acute) (chronic): Secondary | ICD-10-CM | POA: Diagnosis present

## 2024-07-05 DIAGNOSIS — R1013 Epigastric pain: Secondary | ICD-10-CM | POA: Diagnosis not present

## 2024-07-05 DIAGNOSIS — C119 Malignant neoplasm of nasopharynx, unspecified: Secondary | ICD-10-CM | POA: Diagnosis present

## 2024-07-05 DIAGNOSIS — E871 Hypo-osmolality and hyponatremia: Secondary | ICD-10-CM | POA: Diagnosis not present

## 2024-07-05 DIAGNOSIS — K2970 Gastritis, unspecified, without bleeding: Secondary | ICD-10-CM | POA: Diagnosis present

## 2024-07-05 DIAGNOSIS — R112 Nausea with vomiting, unspecified: Secondary | ICD-10-CM | POA: Diagnosis not present

## 2024-07-05 DIAGNOSIS — E872 Acidosis, unspecified: Secondary | ICD-10-CM | POA: Diagnosis present

## 2024-07-05 DIAGNOSIS — E860 Dehydration: Secondary | ICD-10-CM | POA: Diagnosis present

## 2024-07-05 DIAGNOSIS — E669 Obesity, unspecified: Secondary | ICD-10-CM | POA: Diagnosis present

## 2024-07-05 DIAGNOSIS — Z8249 Family history of ischemic heart disease and other diseases of the circulatory system: Secondary | ICD-10-CM | POA: Diagnosis not present

## 2024-07-05 DIAGNOSIS — F1290 Cannabis use, unspecified, uncomplicated: Secondary | ICD-10-CM | POA: Diagnosis present

## 2024-07-05 DIAGNOSIS — Z91013 Allergy to seafood: Secondary | ICD-10-CM | POA: Diagnosis not present

## 2024-07-05 DIAGNOSIS — R1310 Dysphagia, unspecified: Secondary | ICD-10-CM | POA: Diagnosis present

## 2024-07-05 DIAGNOSIS — R1084 Generalized abdominal pain: Secondary | ICD-10-CM | POA: Diagnosis not present

## 2024-07-05 DIAGNOSIS — B2700 Gammaherpesviral mononucleosis without complication: Secondary | ICD-10-CM | POA: Diagnosis present

## 2024-07-05 DIAGNOSIS — J45909 Unspecified asthma, uncomplicated: Secondary | ICD-10-CM | POA: Diagnosis present

## 2024-07-05 HISTORY — PX: IR GASTROSTOMY TUBE MOD SED: IMG625

## 2024-07-05 LAB — RAD ONC ARIA SESSION SUMMARY
Course Elapsed Days: 22
Plan Fractions Treated to Date: 13
Plan Prescribed Dose Per Fraction: 2 Gy
Plan Total Fractions Prescribed: 35
Plan Total Prescribed Dose: 70 Gy
Reference Point Dosage Given to Date: 26 Gy
Reference Point Session Dosage Given: 2 Gy
Session Number: 13

## 2024-07-05 LAB — GLUCOSE, CAPILLARY
Glucose-Capillary: 96 mg/dL (ref 70–99)
Glucose-Capillary: 97 mg/dL (ref 70–99)

## 2024-07-05 LAB — PHOSPHORUS: Phosphorus: 3.4 mg/dL (ref 2.5–4.6)

## 2024-07-05 LAB — MAGNESIUM: Magnesium: 1.7 mg/dL (ref 1.7–2.4)

## 2024-07-05 LAB — PROTIME-INR
INR: 1.1 (ref 0.8–1.2)
Prothrombin Time: 14.4 s (ref 11.4–15.2)

## 2024-07-05 MED ORDER — MIDAZOLAM HCL 2 MG/2ML IJ SOLN
INTRAMUSCULAR | Status: AC
  Filled 2024-07-05: qty 2

## 2024-07-05 MED ORDER — LIDOCAINE VISCOUS HCL 2 % MT SOLN
5.0000 mL | Freq: Once | OROMUCOSAL | Status: AC
  Administered 2024-07-05: 5 mL via OROMUCOSAL

## 2024-07-05 MED ORDER — HYDROMORPHONE HCL 1 MG/ML IJ SOLN
INTRAMUSCULAR | Status: AC | PRN
  Administered 2024-07-05: 1 mg via INTRAVENOUS

## 2024-07-05 MED ORDER — LIDOCAINE HCL 1 % IJ SOLN
INTRAMUSCULAR | Status: AC
  Filled 2024-07-05: qty 20

## 2024-07-05 MED ORDER — OSMOLITE 1.5 CAL PO LIQD: 237.0000 mL | ORAL | Status: DC

## 2024-07-05 MED ORDER — MIDAZOLAM HCL 5 MG/5ML IJ SOLN
INTRAMUSCULAR | Status: AC | PRN
  Administered 2024-07-05: 2 mg via INTRAVENOUS
  Administered 2024-07-05: .5 mg via INTRAVENOUS
  Administered 2024-07-05: 1 mg via INTRAVENOUS
  Administered 2024-07-05: .5 mg via INTRAVENOUS

## 2024-07-05 MED ORDER — ADULT MULTIVITAMIN W/MINERALS CH
1.0000 | ORAL_TABLET | Freq: Every day | ORAL | Status: DC
  Administered 2024-07-05 – 2024-07-09 (×5): 1
  Filled 2024-07-05 (×5): qty 1

## 2024-07-05 MED ORDER — HYDROMORPHONE HCL 1 MG/ML IJ SOLN
INTRAMUSCULAR | Status: AC
  Filled 2024-07-05: qty 1

## 2024-07-05 MED ORDER — OSMOLITE 1.5 CAL PO LIQD
119.0000 mL | ORAL | Status: DC
Start: 2024-07-05 — End: 2024-07-06
  Administered 2024-07-05: 119 mL

## 2024-07-05 MED ORDER — GLUCAGON HCL RDNA (DIAGNOSTIC) 1 MG IJ SOLR
INTRAMUSCULAR | Status: AC
  Filled 2024-07-05: qty 1

## 2024-07-05 MED ORDER — MIDAZOLAM HCL (PF) 2 MG/2ML IJ SOLN
INTRAMUSCULAR | Status: AC | PRN
  Administered 2024-07-05: 1 mg via INTRAVENOUS

## 2024-07-05 MED ORDER — VITAL HP 1.0 CAL PO LIQD: 1000.0000 mL | ORAL | Status: DC

## 2024-07-05 MED ORDER — LIDOCAINE VISCOUS HCL 2 % MT SOLN
OROMUCOSAL | Status: AC
  Filled 2024-07-05: qty 15

## 2024-07-05 MED ORDER — FREE WATER
150.0000 mL | Freq: Every day | Status: DC
  Administered 2024-07-05 – 2024-07-06 (×5): 150 mL

## 2024-07-05 MED ORDER — GLUCAGON HCL RDNA (DIAGNOSTIC) 1 MG IJ SOLR
INTRAMUSCULAR | Status: AC | PRN
  Administered 2024-07-05: 1 mg via INTRAVENOUS

## 2024-07-05 MED ORDER — FENTANYL CITRATE (PF) 100 MCG/2ML IJ SOLN
INTRAMUSCULAR | Status: AC | PRN
  Administered 2024-07-05 (×2): 50 ug via INTRAVENOUS

## 2024-07-05 MED ORDER — IOHEXOL 300 MG/ML  SOLN
25.0000 mL | Freq: Once | INTRAMUSCULAR | Status: AC | PRN
  Administered 2024-07-05: 10 mL

## 2024-07-05 MED ORDER — FENTANYL CITRATE (PF) 100 MCG/2ML IJ SOLN
INTRAMUSCULAR | Status: AC
  Filled 2024-07-05: qty 2

## 2024-07-05 MED ORDER — ONDANSETRON HCL 4 MG/2ML IJ SOLN
INTRAMUSCULAR | Status: AC | PRN
  Administered 2024-07-05: 4 mg via INTRAVENOUS

## 2024-07-05 MED ORDER — AMLODIPINE BESYLATE 5 MG PO TABS
5.0000 mg | ORAL_TABLET | Freq: Every day | ORAL | Status: DC
  Administered 2024-07-05 – 2024-07-09 (×5): 5 mg
  Filled 2024-07-05 (×5): qty 1

## 2024-07-05 MED ORDER — LIDOCAINE HCL 1 % IJ SOLN
20.0000 mL | Freq: Once | INTRAMUSCULAR | Status: AC
Start: 2024-07-05 — End: 2024-07-05
  Administered 2024-07-05: 10 mL via INTRADERMAL

## 2024-07-05 MED ORDER — ONDANSETRON HCL 4 MG/2ML IJ SOLN
INTRAMUSCULAR | Status: AC
  Filled 2024-07-05: qty 2

## 2024-07-05 MED ORDER — THIAMINE MONONITRATE 100 MG PO TABS
100.0000 mg | ORAL_TABLET | Freq: Every day | ORAL | Status: DC
  Administered 2024-07-05 – 2024-07-09 (×4): 100 mg
  Filled 2024-07-05 (×5): qty 1

## 2024-07-05 MED ORDER — OXYCODONE HCL 5 MG PO TABS: 5.0000 mg | ORAL_TABLET | Freq: Four times a day (QID) | ORAL | Status: DC | PRN

## 2024-07-05 NOTE — Discharge Instructions (Addendum)
 Home tube feeding regimen:  237 ml Osmolite 1.5 6 times daily (slowly work up from 1/2 can to full can)   75 ml free water flush before and after each feeding administration   Tube feeding regimen provides 2130 kcal (100% of needs), 89 grams of protein, and 1086 ml of H2O.  Total free water: 1986 ml daily

## 2024-07-05 NOTE — H&P (Signed)
 Chief Complaint: Nasopharyngeal cancer. Request is for  gastrostomy tube placement for nutritional access  Referring Physician(s): Dr. Alexandra Denzii  Supervising Physician: Karalee Beat  Patient Status: [facility] - In-pt  History of Present Illness: Brandi [name] is a 28 y.o. female  inpatient. History of HTN. 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 Presented to the ED at [facility] on 11.3.25 with nausea, vomiting and generalized weakness. Admitted for weakness and dehydration. Team is requesting a gastrostomy tube placement for nutritional access. CT abd pelvis from 10.19.25 shows the stomach in an accessible position. Case reviewed and approved by IR Attending Dr. Cordella Banner.   Girlfriend on phone. Patient alert sitting up on the side of bed. Endorses sore throat. Denies any fevers, headache, chest pain, SOB, cough, abdominal pain, nausea, vomiting or bleeding.   Patient is on lovenox  prophylactic dosage. Last given on 11.3.24 @2020 . WBC is 2.9. INR pending. No pertinent allergies. No NG tube.    Return precautions and treatment recommendations and follow-up discussed with the patient  who is agreeable with the plan.   Past Medical History:  Diagnosis Date   Asthma    Cancer (HCC)    GERD (gastroesophageal reflux disease)    HTN (hypertension)     Past Surgical History:  Procedure Laterality Date   dental procedure     IR US  LIVER BIOPSY  05/17/2024    Allergies: Shellfish allergy and Tomato  Medications: Prior to Admission medications   Medication Sig Start Date End Date Taking? Authorizing Provider  acetaminophen  (TYLENOL ) 325 MG tablet Take 2 tablets (650 mg total) by mouth every 6 (six) hours as needed for mild pain (pain score 1-3) or fever (or Fever >/= 101). 05/19/24  Yes Fausto Sor A, DO  ALPRAZolam  (XANAX ) 0.25 MG tablet Take 1 tablet (0.25 mg total) by mouth at bedtime as needed for anxiety. 06/15/24  Yes [name], MD   cyclobenzaprine  (FLEXERIL ) 10 MG tablet Take 1 tablet (10 mg total) by mouth 3 (three) times daily as needed for muscle spasms. 06/14/24  Yes [name], MD  HYDROcodone -acetaminophen  (NORCO/VICODIN) 5-325 MG tablet Take 1 tablet by mouth every 6 (six) hours as needed for moderate pain (pain score 4-6). 06/18/24  Yes Floy Roberts, MD  lidocaine  (XYLOCAINE ) 2 % solution Use as directed 15 mLs in the mouth or throat every 3 (three) hours as needed for mouth pain. swish and swallow. 06/27/24  Yes Awanda City, MD  ondansetron  (ZOFRAN ) 8 MG tablet Take 1 tablet (8 mg total) by mouth every 8 (eight) hours as needed for nausea or vomiting. Start on the third day after cisplatin. 05/30/24  Yes [name], MD  pantoprazole  (PROTONIX ) 40 MG tablet Take 1 tablet (40 mg total) by mouth daily. 04/18/24 04/18/25 Yes Bradler, Evan K, MD  phenol (CHLORASEPTIC) 1.4 % LIQD Use as directed 1 spray in the mouth or throat as needed for throat irritation / pain. 06/27/24  Yes Awanda City, MD  prochlorperazine (COMPAZINE) 10 MG tablet Take 1 tablet (10 mg total) by mouth every 6 (six) hours as needed (Nausea or vomiting). 05/30/24  Yes [name], MD  senna-docusate (SENOKOT-S) 8.6-50 MG tablet Take 1 tablet by mouth 2 (two) times daily. 05/19/24  Yes Fausto Sor A, DO  SLYND  4 MG TABS Take 1 tablet by mouth daily. 05/20/24  Yes [provider]  gentamicin ointment (GARAMYCIN) 0.1 % Apply 1 Application topically 3 (three) times daily. Patient not taking: Reported on 07/03/2024 05/22/24  [provider]     Family History  Problem Relation Age of Onset   Hypertension Maternal Grandmother    Diabetes Maternal Grandmother     Social History   Socioeconomic History   Marital status: Significant Other    Spouse name: Not on file   Number of children: Not on file   Years of education: Not on file   Highest education level: Not on file  Occupational History   Not on file   Tobacco Use   Smoking status: Former    Types: E-cigarettes   Smokeless tobacco: Never  Vaping Use   Vaping status: Former  Substance and Sexual Activity   Alcohol use: Not Currently    Comment: social   Drug use: Yes    Types: Marijuana   Sexual activity: Yes    Partners: Female    Birth control/protection: None  Other Topics Concern   Not on file  Social History Narrative   Not on file   Social Drivers of Health   Financial Resource Strain: Not on file  Food Insecurity: Patient Declined (07/03/2024)   Hunger Vital Sign    Worried About Running Out of Food in the Last Year: Patient declined    Ran Out of Food in the Last Year: Patient declined  Transportation Needs: Patient Declined (07/03/2024)   PRAPARE - Administrator, Civil Service (Medical): Patient declined    Lack of Transportation (Non-Medical): Patient declined  Physical Activity: Not on file  Stress: Not on file  Social Connections: Patient Declined (07/03/2024)   Social Connection and Isolation Panel    Frequency of Communication with Friends and Family: Patient declined    Frequency of Social Gatherings with Friends and Family: Patient declined    Attends Religious Services: Patient declined    Database Administrator or Organizations: Patient declined    Attends Banker Meetings: Patient declined    Marital Status: Patient declined     Review of Systems: A 12 point ROS discussed and pertinent positives are indicated in the HPI above.  All other systems are negative.  Review of Systems  Constitutional:  Negative for fatigue and fever.  HENT:  Positive for sore throat. Negative for congestion.   Respiratory:  Negative for cough and shortness of breath.   Gastrointestinal:  Negative for abdominal pain, diarrhea, nausea and vomiting.    Vital Signs: BP (!) 147/98 (BP Location: Left Arm)   Pulse 86   Temp 98.9 F (37.2 C) (Oral)   Resp 16   Ht 5' 2 (1.575 m)   Wt 201 lb (91.2  kg)   SpO2 100%   BMI 36.76 kg/m     Physical Exam Vitals and nursing note reviewed.  Constitutional:      Appearance: She is well-developed. She is obese.  HENT:     Head: Normocephalic and atraumatic.     Mouth/Throat:     Mouth: Mucous membranes are dry.  Eyes:     Conjunctiva/sclera: Conjunctivae normal.  Cardiovascular:     Rate and Rhythm: Normal rate.  Pulmonary:     Effort: Pulmonary effort is normal.  Musculoskeletal:        General: Normal range of motion.     Cervical back: Normal range of motion.  Skin:    General: Skin is warm and dry.  Neurological:     General: No focal deficit present.     Mental Status: She is alert and oriented to person, place,  and time. Mental status is at baseline.  Psychiatric:        Mood and Affect: Mood normal.        Behavior: Behavior normal.        Thought Content: Thought content normal.        Judgment: Judgment normal.     Imaging: DG Chest 1 View Result Date: 07/03/2024 EXAM: 1 VIEW(S) XRAY OF THE CHEST 07/03/2024 05:10:42 AM COMPARISON: PA and lateral radiographs of the chest dated 10/31/2012. CLINICAL HISTORY: worsening weakness FINDINGS: LINES, TUBES AND DEVICES: Monitor wires noted. LUNGS AND PLEURA: No focal pulmonary opacity. No pulmonary edema. No pleural effusion. No pneumothorax. HEART AND MEDIASTINUM: No acute abnormality of the cardiac and mediastinal silhouettes. BONES AND SOFT TISSUES: No acute osseous abnormality. IMPRESSION: 1. No acute process. Electronically signed by: Evalene Coho MD 07/03/2024 05:20 AM EST RP Workstation: [id number]   CT ABDOMEN PELVIS W CONTRAST Result Date: 06/18/2024 EXAM: CT ABDOMEN AND PELVIS WITH CONTRAST 06/18/2024 12:25:32 PM TECHNIQUE: CT of the abdomen and pelvis was performed with the administration of 100 mL of iohexol  (OMNIPAQUE ) 300 MG/ML solution. Multiplanar reformatted images are provided for review. Automated exposure control, iterative reconstruction, and/or  weight-based adjustment of the mA/kV was utilized to reduce the radiation dose to as low as reasonably achievable. COMPARISON: PET CT fusion study dated 05/26/2024 and CT of the abdomen and pelvis dated 04/05/2022. CLINICAL HISTORY: left lower back/abdominal pain. Pt comes with c/o back pain and some lower belly pain. Pt states she had 1st round of chemo on Wed and radiation everyday except the weekend. Pt states since that her back has been hurting. Pt states she thinks they didn't flush her out good enough. Pt states she only got two bags of fluids and it was suppose to be 4. FINDINGS: LOWER CHEST: No acute abnormality. LIVER: The liver is unremarkable. GALLBLADDER AND BILE DUCTS: Gallbladder is unremarkable. No biliary ductal dilatation. SPLEEN: No acute abnormality. PANCREAS: No acute abnormality. ADRENAL GLANDS: No acute abnormality. KIDNEYS, URETERS AND BLADDER: No stones in the kidneys or ureters. No hydronephrosis. No perinephric or periureteral stranding. Urinary bladder is unremarkable. GI AND BOWEL: There are few scattered colonic diverticula. Stomach demonstrates no acute abnormality. There is no bowel obstruction. PERITONEUM AND RETROPERITONEUM: No ascites. No free air. VASCULATURE: Aorta is normal in caliber. LYMPH NODES: No lymphadenopathy. REPRODUCTIVE ORGANS: No acute abnormality. BONES AND SOFT TISSUES: No acute osseous abnormality. No focal soft tissue abnormality. IMPRESSION: 1. No acute findings in the abdomen or pelvis. 2. Scattered colonic diverticula without evidence of diverticulitis. Electronically signed by: Evalene Coho MD 06/18/2024 12:55 PM EDT RP Workstation: [id number]    Labs:  CBC: Recent Labs    06/26/24 0706 06/29/24 0836 07/03/24 0525 07/04/24 0435  WBC 3.9* 3.4* 3.6* 2.9*  HGB 11.8* 11.4* 11.7* 10.6*  HCT 35.3* 33.9* 35.1* 31.7*  PLT 510* 440* 383 332    COAGS: Recent Labs    04/20/24 0914 05/17/24 0440 07/05/24 0814  INR 1.1 1.1 1.1     BMP: Recent Labs    06/27/24 0417 06/29/24 0836 07/03/24 0525 07/03/24 2236 07/04/24 0435  NA 132* 134* 133*  --  137  K 4.7 3.3* 3.1* 3.7 3.6  CL 101 102 95*  --  103  CO2 20* 21* 20*  --  25  GLUCOSE 109* 106* 97  --  88  BUN 11 10 13   --  7  CALCIUM 9.1 9.2 9.3  --  9.3  CREATININE 0.73 0.85 0.84  --  0.71  GFRNONAA >60 >60 >60  --  >60    LIVER FUNCTION TESTS: Recent Labs    06/21/24 0911 06/26/24 0706 07/03/24 0525 07/04/24 0435  BILITOT 0.7 1.0 1.0 0.9  AST 18 39 28 22  ALT 20 47* 54* 42  ALKPHOS 64 56 54 48  PROT 7.9 8.3* 8.2* 6.5  ALBUMIN 3.5 3.7 4.0 3.5    TUMOR MARKERS: No results for input(s): AFPTM, CEA, CA199, CHROMGRNA in the last 8760 hours.  Assessment and Plan:  28 y.o. female inpatient. History of HTN. 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 Presented to the ED at [facility] on 11.3.25 with nausea, vomiting and generalized weakness. Admitted for weakness and dehydration. Team is requesting a gastrostomy tube placement for nutritional access. CT abd pelvis from 10.19.25 shows the stomach in an accessible position. Case reviewed and approved by IR Attending Dr. Cordella Banner.   PLAN: IR Image Guided Gastrostomy Tube Placement.  Risks and benefits image guided gastrostomy tube placement was discussed with the patient including, but not limited to the need for a barium enema during the procedure, bleeding, infection, peritonitis and/or damage to adjacent structures.  All of the patient's questions were answered, patient is agreeable to proceed.  Consent signed and in chart.   Thank you for this interesting consult.  I greatly enjoyed meeting Brandi [name] and look forward to participating in their care.  A copy of this report was sent to the requesting provider on this date.  Electronically Signed: Delon JAYSON Beagle, NP 07/05/2024, 8:55 AM   I spent a total of 20 Minutes    in face to face in clinical consultation,  greater than 50% of which was counseling/coordinating care for g tube placement

## 2024-07-05 NOTE — Consult Note (Signed)
 Patient unavailable for consultation.  Presumably in IR receiving PEG tube.  Please have patient keep previously scheduled follow-up appointment in the cancer center for evaluation and consideration of treatment.

## 2024-07-05 NOTE — Procedures (Signed)
 Interventional Radiology Procedure Note  Procedure: Placement of percutaneous 54F push-in gastrostomy tube.  Complications: None  Recommendations: - Clears only today - May begin using tube in 4 hrs   Signed,  Wilkie LOIS Lent, MD

## 2024-07-05 NOTE — Plan of Care (Signed)

## 2024-07-05 NOTE — Progress Notes (Signed)
 Pt had PEG tube, 18 fr/10 ml placed earlier to mid abdomen.  Pt was given Morphine  prior to administration for comfort. Pain level ranging from 8 - 10. Completed initial flushes, med administration and feeding. Pt tolerated all meds, feeding and flushes fairly well. Pt ambulated to bathroom with 1 person assistance using front wheel walker. Pt was assisted back to bed Pt is stable at this time.

## 2024-07-05 NOTE — Progress Notes (Signed)
 Nutrition Follow-up  DOCUMENTATION CODES:   Obesity unspecified  INTERVENTION:   -Once PEG is placed and cleared for use:   237 ml Osmolite 1.5 6 times daily   75 ml free water flush before and after each feeding administration   Tube feeding regimen provides 2130 kcal (100% of needs), 89 grams of protein, and 1086 ml of H2O.  Total free water: 1986 ml daily   -Start with half a can daily and increase to full can next day   -Once feedings are initiated:   -MVI with minerals daily via tube -100 mg thiamine daily via tube x 7 days -Monitor Mg, K, and Phos and replete as needed secondary to high refeeding risk -RD connected with transitions of care team and placed feeding orders for home use; also reached out to cancer center RD for continuity of care -Nursing care order placed to assist with PEG care and education  NUTRITION DIAGNOSIS:   Increased nutrient needs related to cancer and cancer related treatments as evidenced by estimated needs.  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  She follows with oncology outpatient and has had prior admissions for similar problems.  Radiation has been causing significant pain, difficulty swallowing, and decreased taste.  11/5- s/p Procedure: Placement of percutaneous 77F push-in gastrostomy tube  Reviewed I/O's: +1.4 L x 24 hours  Patient received g-tube today. She has been advanced to a clear liquid diet. Per radiology, can use tube for feedings starting at 1725.   Reached out to [facility] to discuss discharge  needs and home feeding regimen. Discussed with RN to assist with education process.   Labs reviewed: CBGS: 69-85.    Diet Order:   Diet Order             Diet clear liquid Room service appropriate? Yes; Fluid consistency: Thin  Diet effective now                   EDUCATION NEEDS:   Education needs have been addressed  Skin:  Skin Assessment: Reviewed RN Assessment  Last BM:  07/03/24  Height:   Ht Readings from Last 1 Encounters:  07/03/24 5' 2 (1.575 m)    Weight:   Wt Readings from Last 1 Encounters:  07/03/24 91.2 kg    Ideal Body Weight:  50 kg  BMI:  Body mass index is 36.76 kg/m.  Estimated Nutritional Needs:   Kcal:  2000-2200  Protein:  90-105 grams  Fluid:  2.0-2.2 L    Margery ORN, RD, LDN, CDCES Registered Dietitian III Certified Diabetes Care and Education Specialist If unable to reach this RD, please use RD Inpatient group chat on secure chat between hours of 8am-4 pm daily

## 2024-07-05 NOTE — Hospital Course (Addendum)
 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  She follows with oncology outpatient and has had prior admissions for similar problems.  Radiation has been causing significant pain, difficulty swallowing, and decreased taste.  Patient reports minimal p.o. intake.  She reports she has decreased energy, and is feeling weaker every day.  She has discussed possible PEG tube outpatient with the nutritionist and oncology team but has been fearful to initiate this as she thought it would spread her malignancy.  After further discussion today she is more amendable and would like to plan for G-tube placement now with IR.   11/5.  Patient had radiation therapy this morning.  PEG tube placed this afternoon.  Okay to start feeding this evening at 6 PM.  Blood pressure high will control pain and also start Norvasc . 11/6.  Patient had nausea vomiting this morning.  Having abdominal pain since procedure. 11/7.  CT scan unremarkable.  Patient's abdominal pain is little bit better.  Only tolerating 50 cc of feeding at a time. 11/8.  Patient had a bowel movement.  Still taking IV meds for pain.  Will convert over to PEG tube meds today.  Patient states she is tolerating a little bit more of the feeding at a time. 11/9.  Patient stating she is in less pain.  Stating she is tolerating more of the tube feeds.  Wanting to go home.  Patient complains of some throat pain and states that secondary to the radiation.

## 2024-07-05 NOTE — Progress Notes (Signed)
 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  She follows with oncology outpatient and has had prior admissions for similar problems.  Radiation has been causing significant pain, difficulty swallowing, and decreased taste.  Patient reports minimal p.o. intake.  She reports she has decreased energy, and is feeling weaker every day.  She has discussed possible PEG tube outpatient with the nutritionist and oncology team but has been fearful to initiate this as she thought it would spread her malignancy.  After further discussion today she is more amendable and would like to plan for G-tube placement now with IR.   11/5.  Patient had radiation therapy this morning.  PEG tube placed this afternoon.  Okay to start feeding this evening at 6 PM.  Blood pressure high will control pain and also start Norvasc .    Assessment and Plan: Intractable nausea and vomiting Odynophagia  Noted intractable nausea and vomiting in setting of nasopharyngeal cancer on active radiation and chemotherapy-followed by Dr. Jacobo outpatient PEG tube placement today. Antiemetic therapy Pain control   Nasopharyngeal carcinoma (HCC) Baseline EBV positive nasopharyngeal cancer followed by Dr. Jacobo outpatient On radiation and chemotherapy   HTN (hypertension) Blood pressure high start Norvasc .  Marijuana use Patient still smoking marijuana.  Patient can potentially develop cannabis hyperemesis syndrome which could be contributing to symptoms.  GERD (gastroesophageal reflux disease) Continue PPI  Asthma Stable from respiratory standpoint Continue home inhalers  Hypokalemia Replaced          Subjective: Patient seen after radiation treatment this morning and before PEG tube placement.  Patient was feeling okay.  Physical Exam: Vitals:   07/05/24 1325 07/05/24 1330 07/05/24 1345 07/05/24 1400  BP: (!) 153/95 (!) 160/102 (!) 154/106 (!) 159/102  Pulse: 83 87 87 86  Resp: (!) 9 (!) 9 13 17   Temp:    (!) 97.4 F (36.3 C)  TempSrc:    Temporal  SpO2: 97% 97% 98% 99%  Weight:      Height:       Physical Exam HENT:     Head: Normocephalic.     Mouth/Throat:     Pharynx: No oropharyngeal exudate.  Eyes:     General: Lids are normal.     Conjunctiva/sclera: Conjunctivae normal.  Cardiovascular:     Rate and Rhythm: Normal rate and regular rhythm.     Heart sounds: Normal heart sounds, S1 normal and S2 normal.  Pulmonary:     Breath sounds: No decreased breath sounds, wheezing, rhonchi or rales.  Abdominal:     Palpations: Abdomen is soft.     Tenderness: There is no abdominal tenderness.  Musculoskeletal:     Right lower leg: No swelling.     Left lower leg: No swelling.  Skin:    General: Skin is warm.  Neurological:     Mental Status: She is alert and oriented to person, place, and time.     Data Reviewed: CMP normal range, white blood cell count 2.9, hemoglobin 10.6, platelet count 332  Family Communication: Declined  Disposition: Status is: Inpatient Remains inpatient appropriate because: PEG tube placed by interventional radiology today.  Feeding can start tonight.  Planned Discharge Destination: Home    Time spent: 63  minutes  Author: Charlie Patterson, MD 07/05/2024 2:32 PM  For on call review [email or web address].

## 2024-07-05 NOTE — Progress Notes (Signed)
 Patient clinically stable post IR Gtube placement per Dr Karalee, very tearful entire procedure at times. , thus received Versed  5 mg , Fentanyl  100 mcg, zofran  4 mg IV, as well as dilaudid 1 mg post procedure. Vitals noted,with bp as charted. Procedure straight forward and went well. Report given to Ruthanna Kitty RN post procedure/specials/18

## 2024-07-05 NOTE — Progress Notes (Signed)
 MEDICATION-RELATED CONSULT NOTE   IR Procedure Consult - Anticoagulant/Antiplatelet PTA/Inpatient Med List Review by Pharmacist    Procedure: Placement of percutaneous 73F push-in gastrostomy tube.     Completed: 11/5 @ 1320   Post-Procedural bleeding risk per IR MD assessment:  LOW  Antithrombotic medications on inpatient or PTA profile prior to procedure:      Enoxaparin  0.5mg /kg every 24 hours   Recommended restart time per IR Post-Procedure Guidelines:   Day 0  (at least 4 hours or at next standard dose interval)     Plan:     Continue enoxaparin  45mg  every 24 hours @ 2200 tonight    Brandi [name], PharmD, BCPS Clinical Pharmacist 07/05/2024 2:17 PM

## 2024-07-06 ENCOUNTER — Inpatient Hospital Stay: Admitting: Oncology

## 2024-07-06 ENCOUNTER — Other Ambulatory Visit: Payer: Self-pay | Admitting: Oncology

## 2024-07-06 ENCOUNTER — Inpatient Hospital Stay

## 2024-07-06 ENCOUNTER — Ambulatory Visit

## 2024-07-06 DIAGNOSIS — I10 Essential (primary) hypertension: Secondary | ICD-10-CM | POA: Diagnosis not present

## 2024-07-06 DIAGNOSIS — R109 Unspecified abdominal pain: Secondary | ICD-10-CM

## 2024-07-06 DIAGNOSIS — K5909 Other constipation: Secondary | ICD-10-CM

## 2024-07-06 DIAGNOSIS — R1084 Generalized abdominal pain: Secondary | ICD-10-CM | POA: Diagnosis not present

## 2024-07-06 DIAGNOSIS — C119 Malignant neoplasm of nasopharynx, unspecified: Secondary | ICD-10-CM | POA: Diagnosis not present

## 2024-07-06 DIAGNOSIS — R112 Nausea with vomiting, unspecified: Secondary | ICD-10-CM | POA: Diagnosis not present

## 2024-07-06 DIAGNOSIS — E871 Hypo-osmolality and hyponatremia: Secondary | ICD-10-CM | POA: Insufficient documentation

## 2024-07-06 LAB — CBC
HCT: 32.6 % — ABNORMAL LOW (ref 36.0–46.0)
Hemoglobin: 10.7 g/dL — ABNORMAL LOW (ref 12.0–15.0)
MCH: 29.6 pg (ref 26.0–34.0)
MCHC: 32.8 g/dL (ref 30.0–36.0)
MCV: 90.1 fL (ref 80.0–100.0)
Platelets: 275 K/uL (ref 150–400)
RBC: 3.62 MIL/uL — ABNORMAL LOW (ref 3.87–5.11)
RDW: 13.7 % (ref 11.5–15.5)
WBC: 4 K/uL (ref 4.0–10.5)
nRBC: 0 % (ref 0.0–0.2)

## 2024-07-06 LAB — GLUCOSE, CAPILLARY
Glucose-Capillary: 112 mg/dL — ABNORMAL HIGH (ref 70–99)
Glucose-Capillary: 121 mg/dL — ABNORMAL HIGH (ref 70–99)
Glucose-Capillary: 145 mg/dL — ABNORMAL HIGH (ref 70–99)
Glucose-Capillary: 83 mg/dL (ref 70–99)
Glucose-Capillary: 91 mg/dL (ref 70–99)
Glucose-Capillary: 94 mg/dL (ref 70–99)
Glucose-Capillary: 97 mg/dL (ref 70–99)

## 2024-07-06 LAB — MAGNESIUM: Magnesium: 1.8 mg/dL (ref 1.7–2.4)

## 2024-07-06 LAB — BASIC METABOLIC PANEL WITH GFR
Anion gap: 10 (ref 5–15)
BUN: 6 mg/dL (ref 6–20)
CO2: 27 mmol/L (ref 22–32)
Calcium: 9.1 mg/dL (ref 8.9–10.3)
Chloride: 99 mmol/L (ref 98–111)
Creatinine, Ser: 0.65 mg/dL (ref 0.44–1.00)
GFR, Estimated: >60 mL/min (ref >60)
Glucose, Bld: 106 mg/dL — ABNORMAL HIGH (ref 70–99)
Potassium: 3.4 mmol/L — ABNORMAL LOW (ref 3.5–5.1)
Sodium: 136 mmol/L (ref 135–145)

## 2024-07-06 LAB — PHOSPHORUS: Phosphorus: 3.9 mg/dL (ref 2.5–4.6)

## 2024-07-06 MED ORDER — FREE WATER
60.0000 mL | Freq: Every day | Status: DC
  Administered 2024-07-06 – 2024-07-09 (×14): 60 mL

## 2024-07-06 MED ORDER — SENNOSIDES 8.8 MG/5ML PO SYRP
5.0000 mL | ORAL_SOLUTION | Freq: Two times a day (BID) | ORAL | Status: DC
  Administered 2024-07-06 – 2024-07-09 (×6): 5 mL
  Filled 2024-07-06 (×7): qty 5

## 2024-07-06 MED ORDER — DOCUSATE SODIUM 50 MG/5ML PO LIQD
50.0000 mg | Freq: Two times a day (BID) | ORAL | Status: DC
  Administered 2024-07-06 – 2024-07-09 (×5): 50 mg
  Filled 2024-07-06 (×7): qty 10

## 2024-07-06 MED ORDER — IOHEXOL 9 MG/ML PO SOLN
500.0000 mL | ORAL | Status: AC
  Administered 2024-07-06: 500 mL via ORAL

## 2024-07-06 MED ORDER — POLYETHYLENE GLYCOL 3350 17 G PO PACK
17.0000 g | PACK | Freq: Every day | ORAL | Status: DC
  Filled 2024-07-06: qty 1

## 2024-07-06 MED ORDER — OSMOLITE 1.5 CAL PO LIQD
119.0000 mL | Freq: Every day | ORAL | Status: DC
  Administered 2024-07-06 (×2): 119 mL
  Administered 2024-07-06: 59.5 mL
  Administered 2024-07-07: 119 mL
  Administered 2024-07-07: 75 mL
  Administered 2024-07-07 (×3): 119 mL
  Administered 2024-07-07: 65 mL
  Administered 2024-07-08 (×2): 119 mL
  Administered 2024-07-08: 110 mL
  Administered 2024-07-08: 119 mL
  Administered 2024-07-08: 110 mL
  Administered 2024-07-09: 119 mL

## 2024-07-06 MED ORDER — POTASSIUM CHLORIDE 20 MEQ PO PACK
20.0000 meq | PACK | Freq: Once | ORAL | Status: AC
  Administered 2024-07-06: 20 meq
  Filled 2024-07-06: qty 1

## 2024-07-06 MED ORDER — SODIUM CHLORIDE 0.9 % IV SOLN
12.5000 mg | Freq: Once | INTRAVENOUS | Status: AC
  Administered 2024-07-06: 12.5 mg via INTRAVENOUS
  Filled 2024-07-06: qty 12.5

## 2024-07-06 MED ORDER — OSMOLITE 1.5 CAL PO LIQD: 237.0000 mL | Freq: Every day | ORAL | Status: DC

## 2024-07-06 NOTE — TOC Progression Note (Signed)
 Transition of Care [facility]) - Progression Note    Patient Details  Name: Brandi [name] MRN: [id number] Date of Birth: 15-Jan-1996  [facility] of [facility]) CM/SW Contact  Daved JONETTA Hamilton, RN Phone Number: 07/06/2024, 11:13 AM  Clinical Narrative:     This CM notified Mitch with [employer] for enteral supply orders, supplies should be delivered to patients house in a couple of days. Discussed with Nutrition and they will deliver supplies to patients room today to get her thru until the ordered supplies are delivered.   TOC will continue to follow.                    Expected Discharge Plan and Services                                               Social Drivers of Health (SDOH) Interventions SDOH Screenings   Food Insecurity: Patient Declined (07/03/2024)  Housing: Patient Declined (07/03/2024)  Transportation Needs: Patient Declined (07/03/2024)  Utilities: Patient Declined (07/03/2024)  Alcohol Screen: Low Risk  (05/30/2024)  Depression (PHQ2-9): Low Risk  (06/29/2024)  Social Connections: Patient Declined (07/03/2024)  Tobacco Use: Medium Risk (07/03/2024)    Readmission Risk Interventions     No data to display

## 2024-07-06 NOTE — Assessment & Plan Note (Addendum)
 Had bowel movement yesterday.  As needed lactulose.

## 2024-07-06 NOTE — Assessment & Plan Note (Addendum)
 After PEG tube placement.  CT scan unremarkable.  Converted off IV pain medications and over to PEG tube pain medications.  Prescribed 5 days of Norco 7.5/325 liquid via PEG.  Can go back on her Norco 5/325 after that.

## 2024-07-06 NOTE — Progress Notes (Signed)
 Nutrition Follow-up  DOCUMENTATION CODES:   Obesity unspecified  INTERVENTION:   -Continue TF via PEG:  119 ml Osmolite 1.5 6 times daily  30 ml free water flush before and after eahc feeding administration  On 07/07/24, titrate to a full bolus:   237 ml Osmolite 1.5 6 times daily   75 ml free water flush before and after each feeding administration   Tube feeding regimen provides 2130 kcal (100% of needs), 89 grams of protein, and 1086 ml of H2O.  Total free water: 1986 ml daily  -Continue MVI with minerals daily via tube -Continue 100 mg thiamine daily via tube x 7 days -Monitor Mg, K, and Phos and replete as needed secondary to high refeeding risk -RD connected with transitions of care team and placed feeding orders for home use; also reached out to cancer center RD for continuity of care; RD provided supply of TF per home use, as home health company will be unable to deliver TF to home until after day of discharge -Nursing care order placed to assist with PEG care and education  NUTRITION DIAGNOSIS:   Increased nutrient needs related to cancer and cancer related treatments as evidenced by estimated needs.  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  She follows with oncology outpatient and has had prior admissions for similar problems.  Radiation has been causing significant pain, difficulty swallowing, and decreased taste.  11/5- s/p Procedure: Placement of percutaneous 47F push-in gastrostomy tube   Reviewed I/O's: +1.1 L x 24 hours and +2.5 L  since admission   Spoke with patient at bedside, who was nauseous and groggy today.   Case discussed with RN, MD, and TOC. Per RN, patient feels full after feedings and complains of nausea. She did vomit this morning; RN provided medications for relief this morning. RD adjusted TF orders to decrease volume for comfort.   Per MD, plan to discharge home tomorrow. TOC has set up home health and TF supplies. RD provided home TF formula to patient, as home health company will not be able to deliver formula on day of discharge.    Medications reviewed and include senokot, colace, protonix , miralax , and thiamine.   Labs reviewed: K: 3.4 (supplemented), CBGS: 94-145 (inpatient orders for glycemic control are none).    Diet Order:   Diet Order             Diet clear liquid Room service appropriate? Yes; Fluid consistency: Thin  Diet effective now                   EDUCATION NEEDS:   Education needs have been addressed  Skin:  Skin Assessment: Reviewed RN Assessment  Last BM:  07/03/24  Height:   Ht Readings from Last 1 Encounters:  07/03/24 5' 2 (1.575 m)    Weight:   Wt Readings from Last 1 Encounters:  07/06/24 92.1 kg    Ideal Body Weight:  50 kg  BMI:  Body mass index is 37.14 kg/m.  Estimated Nutritional Needs:   Kcal:  2000-2200  Protein:  90-105 grams  Fluid:  2.0-2.2 L  Margery ORN, RD, LDN, CDCES Registered Dietitian III Certified Diabetes Care and Education Specialist If unable to reach this RD, please use RD Inpatient group chat on secure chat between hours of 8am-4 pm daily

## 2024-07-06 NOTE — Progress Notes (Signed)
 Referring Physician(s): Denzii, Alexandra,  DO  Supervising Physician: Luverne Aran  Patient Status:  [facility] - In-pt  Chief Complaint: image guided gastrostomy tube placement for nutritional access due to nasopharyngeal cancer  Subjective: Patient ambulating in room with walker. Girlfriend at bedside. Patient complaining of 10/10 abdominal pain she has been having and receiving PRN morphine  to manage her pain. She reports site is sore. States she vomited once this morning and received PRN Zofran  to relieve. She reports fullness before bolus feed is complete. Bolus feeds have been tolerated without complication per RN.   Allergies: Shellfish allergy and Tomato  Medications: Prior to Admission medications   Medication Sig Start Date End Date Taking? Authorizing Provider  acetaminophen  (TYLENOL ) 325 MG tablet Take 2 tablets (650 mg total) by mouth every 6 (six) hours as needed for mild pain (pain score 1-3) or fever (or Fever >/= 101). 05/19/24  Yes Fausto Sor A, DO  ALPRAZolam  (XANAX ) 0.25 MG tablet Take 1 tablet (0.25 mg total) by mouth at bedtime as needed for anxiety. 06/15/24  Yes [name], MD  cyclobenzaprine  (FLEXERIL ) 10 MG tablet Take 1 tablet (10 mg total) by mouth 3 (three) times daily as needed for muscle spasms. 06/14/24  Yes [name], MD  HYDROcodone -acetaminophen  (NORCO/VICODIN) 5-325 MG tablet Take 1 tablet by mouth every 6 (six) hours as needed for moderate pain (pain score 4-6). 06/18/24  Yes Floy Roberts, MD  lidocaine  (XYLOCAINE ) 2 % solution Use as directed 15 mLs in the mouth or throat every 3 (three) hours as needed for mouth pain. swish and swallow. 06/27/24  Yes Awanda City, MD  ondansetron  (ZOFRAN ) 8 MG tablet Take 1 tablet (8 mg total) by mouth every 8 (eight) hours as needed for nausea or vomiting. Start on the third day after cisplatin. 05/30/24  Yes [name], MD  pantoprazole  (PROTONIX ) 40 MG tablet Take 1 tablet (40 mg  total) by mouth daily. 04/18/24 04/18/25 Yes Bradler, Evan K, MD  phenol (CHLORASEPTIC) 1.4 % LIQD Use as directed 1 spray in the mouth or throat as needed for throat irritation / pain. 06/27/24  Yes Awanda City, MD  prochlorperazine (COMPAZINE) 10 MG tablet Take 1 tablet (10 mg total) by mouth every 6 (six) hours as needed (Nausea or vomiting). 05/30/24  Yes [name], MD  senna-docusate (SENOKOT-S) 8.6-50 MG tablet Take 1 tablet by mouth 2 (two) times daily. 05/19/24  Yes Fausto Sor A, DO  SLYND  4 MG TABS Take 1 tablet by mouth daily. 05/20/24  Yes [provider]  gentamicin ointment (GARAMYCIN) 0.1 % Apply 1 Application topically 3 (three) times daily. Patient not taking: Reported on 07/03/2024 05/22/24   [provider]     Vital Signs: BP (!) 149/97 (BP Location: Left Arm)   Pulse 80   Temp 98.5 F (36.9 C)   Resp 18   Ht 5' 2 (1.575 m)   Wt 203 lb 0.7 oz (92.1 kg)   SpO2 100%   BMI 37.14 kg/m   Physical Exam Constitutional:      Appearance: She is obese.  Cardiovascular:     Rate and Rhythm: Normal rate.  Pulmonary:     Effort: Pulmonary effort is normal. No respiratory distress.  Abdominal:     Palpations: Abdomen is soft.     Tenderness: There is abdominal tenderness.     Comments: Mild abdominal tenderness on palpation at g tube insertion site G tube in place with scant old dried  blood  2 t-tacks present Dressed appropriately  Skin:    General: Skin is warm.  Neurological:     Mental Status: She is alert and oriented to person, place, and time.     Imaging: IR GASTROSTOMY TUBE MOD SED Result Date: 07/05/2024 INDICATION: 28 year old female with nasopharyngeal carcinoma currently on therapy but struggling with dysphagia. She presents for placement of a percutaneous gastrostomy tube for supplemental nutrition. EXAM: Fluoroscopically guided placement of percutaneous push in balloon retention gastrostomy tube Interventional Radiologist:   Wilkie LOIS Lent, MD MEDICATIONS: 1 mg glucagon and 4 mg Zofran  administered intravenously by the Radiology nurse ANESTHESIA/SEDATION: Versed  5 mg IV; Fentanyl  100 mcg IV and 1 mg Dilaudid administered intravenously by the Radiology nurse Moderate Sedation Time:  25 minutes The patient's vital signs and level of consciousness were continuously monitored during the procedure by the interventional radiology nurse under my direct supervision. CONTRAST:  10mL OMNIPAQUE  IOHEXOL  300 MG/ML SOLN FLUOROSCOPY: Radiation exposure index: 26.3 mGy, air kerma COMPLICATIONS: None immediate. PROCEDURE: Informed written consent was obtained from the patient after a thorough discussion of the procedural risks, benefits and alternatives. All questions were addressed. Maximal Sterile Barrier Technique was utilized including caps, mask, sterile gowns, sterile gloves, sterile drape, hand hygiene and skin antiseptic. A timeout was performed prior to the initiation of the procedure. Maximal barrier sterile technique utilized including caps, mask, sterile gowns, sterile gloves, large sterile drape, hand hygiene, and chlorhexadine skin prep. An angled catheter was advanced over a wire under fluoroscopic guidance through the nose, down the esophagus and into the body of the stomach. The stomach was then insufflated with several 100 ml of air. Fluoroscopy confirmed location of the gastric bubble, as well as inferior displacement of the barium stained colon. Under direct fluoroscopic guidance, two T-tacks were placed, and the anterior gastric wall drawn up against the anterior abdominal wall. Percutaneous access was then obtained into the mid gastric body with an 18 gauge trocar needle. Aspiration of air, and injection of contrast material under fluoroscopy confirmed needle placement. An Amplatz wire was advanced in the gastric body and the access needle was removed. A 10 x 100 mm Athletis balloon was then advanced coaxially through a 18  French balloon retention percutaneous gastrostomy tube. The balloon/gastrostomy tube assembly was then advanced over the wire. The balloon was advanced through the anterior abdominal wall along the percutaneous tract. The balloon was then inflated to full effacement dilating the tract. As the balloon was deflated, the assembly was advanced over the wire and into the stomach carrying the gastrostomy tube into the stomach. The retention balloon was filled with 10 mL saline. Contrast was injected through the tube confirming its location within the stomach. The tube was then secured with the external bumper and capped. The patient will be observed for several hours with the newly placed tube on low wall suction to evaluate for any post procedure complication. The patient tolerated the procedure well, there is no immediate complication. IMPRESSION: Successful placement of an 67 French balloon retention gastrostomy tube. Electronically Signed   By: Wilkie Lent M.D.   On: 07/05/2024 14:29   DG Chest 1 View Result Date: 07/03/2024 EXAM: 1 VIEW(S) XRAY OF THE CHEST 07/03/2024 05:10:42 AM COMPARISON: PA and lateral radiographs of the chest dated 10/31/2012. CLINICAL HISTORY: worsening weakness FINDINGS: LINES, TUBES AND DEVICES: Monitor wires noted. LUNGS AND PLEURA: No focal pulmonary opacity. No pulmonary edema. No pleural effusion. No pneumothorax. HEART AND MEDIASTINUM: No acute abnormality of the cardiac  and mediastinal silhouettes. BONES AND SOFT TISSUES: No acute osseous abnormality. IMPRESSION: 1. No acute process. Electronically signed by: Evalene Coho MD 07/03/2024 05:20 AM EST RP Workstation: [id number]    Labs:  CBC: Recent Labs    06/29/24 0836 07/03/24 0525 07/04/24 0435 07/06/24 0424  WBC 3.4* 3.6* 2.9* 4.0  HGB 11.4* 11.7* 10.6* 10.7*  HCT 33.9* 35.1* 31.7* 32.6*  PLT 440* 383 332 275    COAGS: Recent Labs    04/20/24 0914 05/17/24 0440 07/05/24 0814  INR 1.1 1.1 1.1     BMP: Recent Labs    06/29/24 0836 07/03/24 0525 07/03/24 2236 07/04/24 0435 07/06/24 0424  NA 134* 133*  --  137 136  K 3.3* 3.1* 3.7 3.6 3.4*  CL 102 95*  --  103 99  CO2 21* 20*  --  25 27  GLUCOSE 106* 97  --  88 106*  BUN 10 13  --  7 6  CALCIUM 9.2 9.3  --  9.3 9.1  CREATININE 0.85 0.84  --  0.71 0.65  GFRNONAA >60 >60  --  >60 >60    LIVER FUNCTION TESTS: Recent Labs    06/21/24 0911 06/26/24 0706 07/03/24 0525 07/04/24 0435  BILITOT 0.7 1.0 1.0 0.9  AST 18 39 28 22  ALT 20 47* 54* 42  ALKPHOS 64 56 54 48  PROT 7.9 8.3* 8.2* 6.5  ALBUMIN 3.5 3.7 4.0 3.5    Assessment and Plan: Nasopharyngeal cancer- S/p gastrostomy tube placement on 07/05/2024 by Dr Karalee  Patient does have a higher than expected pain level, but physical exam is reassuring. Patient will need follow up in 10-14 days to assess t-tacks for removal.   Please re-consult IR team with any concerns.   Electronically Signed: Azriel Jakob B Olesya Wike, NP 07/06/2024, 1:09 PM   I spent a total of 15 Minutes at the the patient's bedside AND on the patient's hospital floor or unit, greater than 50% of which was counseling/coordinating care for gastrostomy tube follow up.

## 2024-07-06 NOTE — Plan of Care (Signed)

## 2024-07-06 NOTE — Progress Notes (Signed)
 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  She follows with oncology outpatient and has had prior admissions for similar problems.  Radiation has been causing significant pain, difficulty swallowing, and decreased taste.  Patient reports minimal p.o. intake.  She reports she has decreased energy, and is feeling weaker every day.  She has discussed possible PEG tube outpatient with the nutritionist and oncology team but has been fearful to initiate this as she thought it would spread her malignancy.  After further discussion today she is more amendable and would like to plan for G-tube placement now with IR.   11/5.  Patient had radiation therapy this morning.  PEG tube placed this afternoon.  Okay to start feeding this evening at 6 PM.  Blood pressure high will control pain and also start Norvasc .    Assessment and Plan: * Abdominal pain After PEG tube placement.  Will continue to watch today and make sure she is tolerating feeds and less pain prior to going home.  Nasopharyngeal carcinoma (HCC) Baseline EBV positive nasopharyngeal cancer followed by Dr. Jacobo outpatient On radiation and chemotherapy   Intractable nausea and vomiting Odynophagia  Noted intractable nausea and vomiting in setting of nasopharyngeal cancer on active radiation and chemotherapy-followed by Dr. Jacobo outpatient PEG tube placement on 11/5.  TOC working on obtaining tube feeds as outpatient. Antiemetic therapy Pain control   HTN (hypertension) Blood pressure high.  Continue Norvasc .  Try to control pain better  Marijuana use Patient still smoking marijuana.  Patient can potentially develop  cannabis hyperemesis syndrome which could be contributing to symptoms.  GERD (gastroesophageal reflux disease) Continue PPI  Asthma Stable from respiratory standpoint Continue home inhalers  Hypokalemia Replace via tube   Other constipation MiraLAX  down to tube until bowel movement.  Hyponatremia Improved        Subjective: Patient stated since the procedure with the feeding tube she is having abdominal pain.  Stated she also vomited this morning.  Has not had a bowel movement in a few days.  Physical Exam: Vitals:   07/05/24 1959 07/06/24 0401 07/06/24 0500 07/06/24 0751  BP: (!) 151/106 (!) 149/102  (!) 149/97  Pulse: 91 86  80  Resp: 16 16  18   Temp: 99 F (37.2 C) 98.3 F (36.8 C)  98.5 F (36.9 C)  TempSrc: Oral Oral    SpO2: 100% 100%  100%  Weight:   92.1 kg   Height:       Physical Exam HENT:     Head: Normocephalic.     Mouth/Throat:     Pharynx: No oropharyngeal exudate.  Eyes:     General: Lids are normal.     Conjunctiva/sclera: Conjunctivae normal.  Cardiovascular:     Rate and Rhythm: Normal rate and regular rhythm.     Heart sounds: Normal heart sounds, S1 normal and S2 normal.  Pulmonary:     Breath sounds: No decreased breath sounds, wheezing, rhonchi or rales.  Abdominal:     Palpations: Abdomen is soft.     Tenderness: There is generalized abdominal tenderness.  Musculoskeletal:     Right lower leg: No swelling.     Left lower leg: No swelling.  Skin:  General: Skin is warm.  Neurological:     Mental Status: She is alert and oriented to person, place, and time.     Data Reviewed: Sodium 134, potassium 3.3, creatinine 0.85, white blood cell count 3.4, hemoglobin 11.4, platelet count 440  Family Communication: Girlfriend at bedside  Disposition: Status is: Inpatient Remains inpatient appropriate because: Patient vomiting this morning and having abdominal pain we will watch another day in the hospital and reevaluate on a daily  basis.  Planned Discharge Destination: Home    Time spent: 28 minutes  Author: Charlie Patterson, MD 07/06/2024 2:49 PM  For on call review [email or web address].

## 2024-07-06 NOTE — Progress Notes (Signed)
 Patient has been nauseous all day. She refused half of her 1525 feeding and refused all of her 1800 feeding due to vomiting. Zofran  given at 1519 and can't be given again until 2119. MD and dietitian aware. MD ordered IV  Phenergan .

## 2024-07-06 NOTE — Assessment & Plan Note (Signed)
 Improved.

## 2024-07-07 ENCOUNTER — Ambulatory Visit

## 2024-07-07 ENCOUNTER — Inpatient Hospital Stay

## 2024-07-07 ENCOUNTER — Inpatient Hospital Stay: Attending: Oncology

## 2024-07-07 ENCOUNTER — Other Ambulatory Visit: Payer: Self-pay

## 2024-07-07 DIAGNOSIS — F419 Anxiety disorder, unspecified: Secondary | ICD-10-CM | POA: Insufficient documentation

## 2024-07-07 DIAGNOSIS — Z5111 Encounter for antineoplastic chemotherapy: Secondary | ICD-10-CM | POA: Insufficient documentation

## 2024-07-07 DIAGNOSIS — R1013 Epigastric pain: Secondary | ICD-10-CM | POA: Diagnosis not present

## 2024-07-07 DIAGNOSIS — D72819 Decreased white blood cell count, unspecified: Secondary | ICD-10-CM | POA: Insufficient documentation

## 2024-07-07 DIAGNOSIS — C119 Malignant neoplasm of nasopharynx, unspecified: Secondary | ICD-10-CM | POA: Insufficient documentation

## 2024-07-07 DIAGNOSIS — Z931 Gastrostomy status: Secondary | ICD-10-CM | POA: Insufficient documentation

## 2024-07-07 DIAGNOSIS — M549 Dorsalgia, unspecified: Secondary | ICD-10-CM | POA: Insufficient documentation

## 2024-07-07 DIAGNOSIS — R112 Nausea with vomiting, unspecified: Secondary | ICD-10-CM | POA: Diagnosis not present

## 2024-07-07 DIAGNOSIS — I10 Essential (primary) hypertension: Secondary | ICD-10-CM | POA: Insufficient documentation

## 2024-07-07 DIAGNOSIS — E876 Hypokalemia: Secondary | ICD-10-CM | POA: Insufficient documentation

## 2024-07-07 DIAGNOSIS — Z79899 Other long term (current) drug therapy: Secondary | ICD-10-CM | POA: Insufficient documentation

## 2024-07-07 DIAGNOSIS — J45909 Unspecified asthma, uncomplicated: Secondary | ICD-10-CM | POA: Insufficient documentation

## 2024-07-07 DIAGNOSIS — D75839 Thrombocytosis, unspecified: Secondary | ICD-10-CM | POA: Insufficient documentation

## 2024-07-07 DIAGNOSIS — Z87891 Personal history of nicotine dependence: Secondary | ICD-10-CM | POA: Insufficient documentation

## 2024-07-07 DIAGNOSIS — K219 Gastro-esophageal reflux disease without esophagitis: Secondary | ICD-10-CM | POA: Insufficient documentation

## 2024-07-07 DIAGNOSIS — K5900 Constipation, unspecified: Secondary | ICD-10-CM | POA: Insufficient documentation

## 2024-07-07 DIAGNOSIS — R1310 Dysphagia, unspecified: Secondary | ICD-10-CM | POA: Insufficient documentation

## 2024-07-07 DIAGNOSIS — D649 Anemia, unspecified: Secondary | ICD-10-CM | POA: Insufficient documentation

## 2024-07-07 LAB — CBC
HCT: 34.2 % — ABNORMAL LOW (ref 36.0–46.0)
Hemoglobin: 11.3 g/dL — ABNORMAL LOW (ref 12.0–15.0)
MCH: 29.5 pg (ref 26.0–34.0)
MCHC: 33 g/dL (ref 30.0–36.0)
MCV: 89.3 fL (ref 80.0–100.0)
Platelets: 257 K/uL (ref 150–400)
RBC: 3.83 MIL/uL — ABNORMAL LOW (ref 3.87–5.11)
RDW: 14.1 % (ref 11.5–15.5)
WBC: 3.9 K/uL — ABNORMAL LOW (ref 4.0–10.5)
nRBC: 0 % (ref 0.0–0.2)

## 2024-07-07 LAB — RAD ONC ARIA SESSION SUMMARY
Course Elapsed Days: 24
Plan Fractions Treated to Date: 14
Plan Prescribed Dose Per Fraction: 2 Gy
Plan Total Fractions Prescribed: 35
Plan Total Prescribed Dose: 70 Gy
Reference Point Dosage Given to Date: 28 Gy
Reference Point Session Dosage Given: 2 Gy
Session Number: 14

## 2024-07-07 LAB — BASIC METABOLIC PANEL WITH GFR
Anion gap: 11 (ref 5–15)
BUN: 7 mg/dL (ref 6–20)
CO2: 26 mmol/L (ref 22–32)
Calcium: 9.3 mg/dL (ref 8.9–10.3)
Chloride: 99 mmol/L (ref 98–111)
Creatinine, Ser: 0.85 mg/dL (ref 0.44–1.00)
GFR, Estimated: >60 mL/min (ref >60)
Glucose, Bld: 96 mg/dL (ref 70–99)
Potassium: 3.4 mmol/L — ABNORMAL LOW (ref 3.5–5.1)
Sodium: 136 mmol/L (ref 135–145)

## 2024-07-07 LAB — MAGNESIUM: Magnesium: 2 mg/dL (ref 1.7–2.4)

## 2024-07-07 LAB — GLUCOSE, CAPILLARY
Glucose-Capillary: 127 mg/dL — ABNORMAL HIGH (ref 70–99)
Glucose-Capillary: 132 mg/dL — ABNORMAL HIGH (ref 70–99)
Glucose-Capillary: 133 mg/dL — ABNORMAL HIGH (ref 70–99)
Glucose-Capillary: 136 mg/dL — ABNORMAL HIGH (ref 70–99)
Glucose-Capillary: 84 mg/dL (ref 70–99)
Glucose-Capillary: 92 mg/dL (ref 70–99)

## 2024-07-07 LAB — PHOSPHORUS: Phosphorus: 4 mg/dL (ref 2.5–4.6)

## 2024-07-07 MED ORDER — LACTULOSE 10 GM/15ML PO SOLN
20.0000 g | Freq: Once | ORAL | Status: AC
  Administered 2024-07-07: 20 g
  Filled 2024-07-07: qty 30

## 2024-07-07 MED ORDER — POLYETHYLENE GLYCOL 3350 17 G PO PACK
17.0000 g | PACK | Freq: Two times a day (BID) | ORAL | Status: DC
  Administered 2024-07-07 – 2024-07-09 (×2): 17 g
  Filled 2024-07-07 (×5): qty 1

## 2024-07-07 MED ORDER — POTASSIUM CHLORIDE 20 MEQ PO PACK
20.0000 meq | PACK | Freq: Every day | ORAL | Status: DC
  Administered 2024-07-07 – 2024-07-08 (×2): 20 meq via ORAL
  Filled 2024-07-07 (×2): qty 1

## 2024-07-07 MED ORDER — IOHEXOL 300 MG/ML  SOLN
100.0000 mL | Freq: Once | INTRAMUSCULAR | Status: AC | PRN
  Administered 2024-07-07: 100 mL via INTRAVENOUS

## 2024-07-07 MED ORDER — MORPHINE SULFATE (PF) 2 MG/ML IV SOLN
2.0000 mg | INTRAVENOUS | Status: DC | PRN
  Administered 2024-07-07 – 2024-07-08 (×4): 2 mg via INTRAVENOUS
  Filled 2024-07-07 (×4): qty 1

## 2024-07-07 NOTE — Progress Notes (Signed)
 Patient was unable to hold down contrast for CT. Messaged Dr. Cleatus about holding off the CT with contrast until tomorrow after patient could speak to Dr. Josette, Dr. Cleatus agreed. Patient refused first feeding at 2100 due to pain from previous feedings. Patient was agreeable to 0600 feeds if only doing 50 mL and slowly increasing the amount per feed. Patient participated and did the feed with minimal help. Tolerated well.

## 2024-07-07 NOTE — Progress Notes (Signed)
 Nutrition Follow-up  DOCUMENTATION CODES:   Obesity unspecified  INTERVENTION:   -Continue TF via PEG:   119 ml Osmolite 1.5 6 times daily   30 ml free water flush before and after eahc feeding administration   On 07/07/24, titrate to a full bolus:    237 ml Osmolite 1.5 6 times daily   75 ml free water flush before and after each feeding administration   Tube feeding regimen provides 2130 kcal (100% of needs), 89 grams of protein, and 1086 ml of H2O.  Total free water: 1986 ml daily   -Continue MVI with minerals daily via tube -Continue 100 mg thiamine daily via tube x 7 days -Monitor Mg, K, and Phos and replete as needed secondary to high refeeding risk -RD connected with transitions of care team and placed feeding orders for home use; also reached out to cancer center RD for continuity of care; RD provided supply of TF per home use, as home health company will be unable to deliver TF to home until after day of discharge -Nursing care order placed to assist with PEG care and education  NUTRITION DIAGNOSIS:   Increased nutrient needs related to cancer and cancer related treatments as evidenced by estimated needs.  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  She follows with oncology outpatient and has had prior admissions for similar problems.  Radiation has been causing significant pain, difficulty swallowing, and decreased taste.  11/5- s/p Procedure: Placement of percutaneous 38F push-in gastrostomy tube   Reviewed I/O's: +435 ml x 24 hours and  +2.9 L since admission  Case discussed with RN, patient refused 1800 feeding last night. She also experienced nausea and vomiting last night. Patient is accepting feeds now, however, will not take full amount (65 ml).   Per MD, plan to order CT scan.   TOC has set up home health and TF supplies. RD provided home TF formula to patient, as home health company will not be able to deliver formula on day of discharge.   Weight has been stable since admission.   Medications reviewed and include colace, senokot, lovenox , protonix , miralax , and thiamine.   Labs reviewed: K: 3.4, CBGS: 83-132 (inpatient orders for glycemic control are none).    Diet Order:   Diet Order             Diet clear liquid Room service appropriate? Yes; Fluid consistency: Thin  Diet effective now                   EDUCATION NEEDS:   Education needs have been addressed  Skin:  Skin Assessment: Reviewed RN Assessment  Last BM:  07/03/24  Height:   Ht Readings from Last 1 Encounters:  07/03/24 5' 2 (1.575 m)    Weight:   Wt Readings from Last 1 Encounters:  07/07/24 92.5 kg    Ideal Body Weight:  50 kg  BMI:  Body mass index is 37.3 kg/m.  Estimated Nutritional Needs:   Kcal:  2000-2200  Protein:  90-105 grams  Fluid:  2.0-2.2 L    Margery ORN, RD, LDN, CDCES  Registered Dietitian III Certified Diabetes Care and Education Specialist If unable to reach this RD, please use RD Inpatient group chat on secure chat between hours of 8am-4 pm daily

## 2024-07-07 NOTE — Plan of Care (Signed)

## 2024-07-07 NOTE — Progress Notes (Signed)
 [facility] CSW Progress Note  Clinical Social Work Intern contacted patient by phone to follow-up on emotional support. Intern had planned to see patient at infusion treatment this week, so called to check in over the phone since pt is admitted to the hospital. Patient reports she is still in a lot of pain since the PEG tube placement on 11/5, but feels she is being well cared for. Intern offered pt encouragement and gave her contact info again, encouraging her to call if she needs anything.      Follow Up Plan:  Patient will contact CSW with any support or resource needs and CSW Intern will continue following.     Thersia KATHEE Daring Clinical Social Work Intern [employer]    Patient is participating in a Managed Medicaid Plan:  Yes

## 2024-07-07 NOTE — Progress Notes (Addendum)
 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  She follows with oncology outpatient and has had prior admissions for similar problems.  Radiation has been causing significant pain, difficulty swallowing, and decreased taste.  Patient reports minimal p.o. intake.  She reports she has decreased energy, and is feeling weaker every day.  She has discussed possible PEG tube outpatient with the nutritionist and oncology team but has been fearful to initiate this as she thought it would spread her malignancy.  After further discussion today she is more amendable and would like to plan for G-tube placement now with IR.   11/5.  Patient had radiation therapy this morning.  PEG tube placed this afternoon.  Okay to start feeding this evening at 6 PM.  Blood pressure high will control pain and also start Norvasc . 11/6.  Patient had nausea vomiting this morning.  Having abdominal pain since procedure. 11/7.  CT scan ordered last night but not done till today and pending results.  Patient's abdominal pain is little bit better.  Only tolerating 50 cc of feeding at a time.     Assessment and Plan: * Abdominal pain After PEG tube placement.  CT scan ordered last night since she was vomiting and only tolerating a little bit of the tube feeds at that time and had worsening pain.  CT scan was done this morning but still pending read.  Only tolerating 50 cc of tube feeding at a time.  Nasopharyngeal carcinoma (HCC) Baseline EBV positive nasopharyngeal cancer followed by Dr. Jacobo outpatient On radiation and chemotherapy   Intractable nausea and vomiting Odynophagia  Noted intractable nausea and vomiting  in setting of nasopharyngeal cancer on active radiation and chemotherapy-followed by Dr. Jacobo outpatient PEG tube placement on 11/5.  TOC working on obtaining tube feeds as outpatient. Antiemetic therapy Pain control   HTN (hypertension) Blood pressure improved with Norvasc .  Try to control pain better  Marijuana use Patient still smoking marijuana.  Patient can potentially develop cannabis hyperemesis syndrome which could be contributing to symptoms.  GERD (gastroesophageal reflux disease) Continue PPI  Asthma Stable from respiratory standpoint Continue home inhalers  Hypokalemia Replace via tube again today   Other constipation Patient declined MiraLAX .  Will give lactulose.  Also had senna Colace.  Hyponatremia Improved        Subjective: Patient still having abdominal pain at the site of insertion of the PEG tube.  Only able to tolerate about 50 cc at a time of feeding.  Physical Exam: Vitals:   07/06/24 1941 07/07/24 0451 07/07/24 0500 07/07/24 0809  BP: 137/89 (!) 139/90  134/85  Pulse: 85 85  (!) 101  Resp: 16 16  20   Temp: 99.1 F (37.3 C) 98.8 F (37.1 C)  98.8 F (37.1 C)  TempSrc: Oral Oral    SpO2: 100% 100%  100%  Weight:   92.5 kg   Height:       Physical Exam HENT:     Head: Normocephalic.     Mouth/Throat:     Pharynx: No oropharyngeal exudate.  Eyes:     General: Lids are normal.     Conjunctiva/sclera: Conjunctivae normal.  Cardiovascular:     Rate and Rhythm: Normal rate  and regular rhythm.     Heart sounds: Normal heart sounds, S1 normal and S2 normal.  Pulmonary:     Breath sounds: No decreased breath sounds, wheezing, rhonchi or rales.  Abdominal:     Palpations: Abdomen is soft.     Tenderness: There is abdominal tenderness in the epigastric area.  Musculoskeletal:     Right lower leg: No swelling.     Left lower leg: No swelling.  Skin:    General: Skin is warm.  Neurological:     Mental Status: She is alert and  oriented to person, place, and time.     Data Reviewed: Sodium 136 potassium 3.4, creatinine 0.85 white blood cell count 3.9, hemoglobin 11.3, platelet count 257   Disposition: Status is: Inpatient Remains inpatient appropriate because: Close patient only tolerated about 50 cc of feed at a time will continue to monitor.  Still having a lot of abdominal pain.  CT scan pending report  Planned Discharge Destination: Home    Time spent: 28 minutes  Author: Charlie Patterson, MD 07/07/2024 3:30 PM  For on call review [email or web address].

## 2024-07-08 DIAGNOSIS — C119 Malignant neoplasm of nasopharynx, unspecified: Secondary | ICD-10-CM | POA: Diagnosis not present

## 2024-07-08 DIAGNOSIS — R112 Nausea with vomiting, unspecified: Secondary | ICD-10-CM | POA: Diagnosis not present

## 2024-07-08 DIAGNOSIS — I10 Essential (primary) hypertension: Secondary | ICD-10-CM | POA: Diagnosis not present

## 2024-07-08 DIAGNOSIS — R1013 Epigastric pain: Secondary | ICD-10-CM | POA: Diagnosis not present

## 2024-07-08 LAB — GLUCOSE, CAPILLARY
Glucose-Capillary: 101 mg/dL — ABNORMAL HIGH (ref 70–99)
Glucose-Capillary: 117 mg/dL — ABNORMAL HIGH (ref 70–99)
Glucose-Capillary: 139 mg/dL — ABNORMAL HIGH (ref 70–99)
Glucose-Capillary: 143 mg/dL — ABNORMAL HIGH (ref 70–99)
Glucose-Capillary: 160 mg/dL — ABNORMAL HIGH (ref 70–99)
Glucose-Capillary: 166 mg/dL — ABNORMAL HIGH (ref 70–99)

## 2024-07-08 LAB — PHOSPHORUS: Phosphorus: 3.5 mg/dL (ref 2.5–4.6)

## 2024-07-08 LAB — BASIC METABOLIC PANEL WITH GFR
Anion gap: 11 (ref 5–15)
BUN: 8 mg/dL (ref 6–20)
CO2: 26 mmol/L (ref 22–32)
Calcium: 9.3 mg/dL (ref 8.9–10.3)
Chloride: 98 mmol/L (ref 98–111)
Creatinine, Ser: 0.78 mg/dL (ref 0.44–1.00)
GFR, Estimated: >60 mL/min (ref >60)
Glucose, Bld: 149 mg/dL — ABNORMAL HIGH (ref 70–99)
Potassium: 3.6 mmol/L (ref 3.5–5.1)
Sodium: 135 mmol/L (ref 135–145)

## 2024-07-08 LAB — MAGNESIUM: Magnesium: 2 mg/dL (ref 1.7–2.4)

## 2024-07-08 MED ORDER — MORPHINE SULFATE (PF) 2 MG/ML IV SOLN: 1.0000 mg | INTRAVENOUS | Status: DC | PRN

## 2024-07-08 MED ORDER — HYDROCODONE-ACETAMINOPHEN 7.5-325 MG/15ML PO SOLN
10.0000 mL | ORAL | Status: DC | PRN
Start: 2024-07-08 — End: 2024-07-09
  Administered 2024-07-08 (×2): 10 mL via ORAL
  Filled 2024-07-08 (×2): qty 15

## 2024-07-08 NOTE — Progress Notes (Signed)
 Pt was able to tolerate up to 100 mL of Osmolite 1.5 this morning

## 2024-07-08 NOTE — Progress Notes (Signed)
 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  She follows with oncology outpatient and has had prior admissions for similar problems.  Radiation has been causing significant pain, difficulty swallowing, and decreased taste.  Patient reports minimal p.o. intake.  She reports she has decreased energy, and is feeling weaker every day.  She has discussed possible PEG tube outpatient with the nutritionist and oncology team but has been fearful to initiate this as she thought it would spread her malignancy.  After further discussion today she is more amendable and would like to plan for G-tube placement now with IR.   11/5.  Patient had radiation therapy this morning.  PEG tube placed this afternoon.  Okay to start feeding this evening at 6 PM.  Blood pressure high will control pain and also start Norvasc . 11/6.  Patient had nausea vomiting this morning.  Having abdominal pain since procedure. 11/7.  CT scan unremarkable.  Patient's abdominal pain is little bit better.  Only tolerating 50 cc of feeding at a time. 11/8.  Patient had a bowel movement.  Still taking IV meds for pain.  Will convert over to PEG tube meds today.  Patient states she is tolerating a little bit more of the feeding at a time.    Assessment and Plan: * Abdominal pain After PEG tube placement.  CT scan unremarkable.  Converted off IV pain medications and over to PEG tube pain medications.  Hopefully today can tolerate more feeds at a time.  Nasopharyngeal carcinoma (HCC) Baseline EBV positive nasopharyngeal cancer followed by Dr. Jacobo outpatient On radiation and chemotherapy   Intractable nausea and  vomiting Odynophagia  Noted intractable nausea and vomiting in setting of nasopharyngeal cancer on active radiation and chemotherapy-followed by Dr. Jacobo outpatient PEG tube placement on 11/5.  Still not able to tolerate goal tube feeds. Antiemetic therapy Pain control   HTN (hypertension) Blood pressure improved with Norvasc .    Marijuana use Patient still smoking marijuana.  Patient can potentially develop cannabis hyperemesis syndrome which could be contributing to symptoms.  GERD (gastroesophageal reflux disease) Continue PPI  Asthma Stable from respiratory standpoint Continue home inhalers  Hypokalemia Replaced   Other constipation Had bowel movement this morning.  Hyponatremia Improved        Subjective: Patient still complaining of abdominal pain.  CT scan unremarkable.  Admitted with weakness and dehydration and had a PEG tube placed.  Physical Exam: Vitals:   07/07/24 1949 07/08/24 0338 07/08/24 0442 07/08/24 0747  BP: 126/78 (!) 135/93  127/81  Pulse: (!) 101 97  (!) 108  Resp: 17 18  19   Temp: 97.7 F (36.5 C) 98.3 F (36.8 C)  99 F (37.2 C)  TempSrc:    Oral  SpO2: 96% 100%  100%  Weight:   94.9 kg   Height:       Physical Exam HENT:     Head: Normocephalic.     Mouth/Throat:     Pharynx: No oropharyngeal exudate.  Eyes:     General: Lids are normal.     Conjunctiva/sclera: Conjunctivae normal.  Cardiovascular:     Rate and Rhythm: Normal rate and regular rhythm.     Heart sounds: Normal heart sounds, S1  normal and S2 normal.  Pulmonary:     Breath sounds: No decreased breath sounds, wheezing, rhonchi or rales.  Abdominal:     Palpations: Abdomen is soft.     Tenderness: There is abdominal tenderness in the epigastric area.  Musculoskeletal:     Right lower leg: No swelling.     Left lower leg: No swelling.  Skin:    General: Skin is warm.  Neurological:     Mental Status: She is alert and oriented to person, place, and time.      Data Reviewed: Creatinine 0.78, electrolytes normal range, glucose 149  Disposition: Status is: Inpatient Remains inpatient appropriate because: Advised patient convert off IV pain medications and over to PEG tube pain medications today.  Hopefully she can start tolerating more with the tube feeds today.  Planned Discharge Destination: Home    Time spent: 28 minutes  Author: Charlie Patterson, MD 07/08/2024 11:49 AM  For on call review [email or web address].

## 2024-07-08 NOTE — Plan of Care (Signed)
  Problem: Education: Goal: Knowledge of General Education information will improve Description: Including pain rating scale, medication(s)/side effects and non-pharmacologic comfort measures Outcome: Progressing   Problem: Clinical Measurements: Goal: Will remain free from infection Outcome: Progressing   Problem: Activity: Goal: Risk for activity intolerance will decrease Outcome: Progressing   Problem: Nutrition: Goal: Adequate nutrition will be maintained Outcome: Progressing   Problem: Coping: Goal: Level of anxiety will decrease Outcome: Progressing   Problem: Pain Managment: Goal: General experience of comfort will improve and/or be controlled Outcome: Progressing   Problem: Safety: Goal: Ability to remain free from injury will improve Outcome: Progressing

## 2024-07-08 NOTE — Progress Notes (Signed)
 Patient is tolerating TF with no reactions noted. She is still unable to titrate to full bolus of 237 mL. She was feeling sick but was able to get approx 70 mL of TF. BG is slightly elevated but will continue to monitor the rest of shift.

## 2024-07-08 NOTE — Plan of Care (Signed)

## 2024-07-09 ENCOUNTER — Ambulatory Visit

## 2024-07-09 ENCOUNTER — Encounter: Payer: Self-pay | Admitting: Oncology

## 2024-07-09 ENCOUNTER — Other Ambulatory Visit: Payer: Self-pay

## 2024-07-09 DIAGNOSIS — I10 Essential (primary) hypertension: Secondary | ICD-10-CM | POA: Diagnosis not present

## 2024-07-09 DIAGNOSIS — R112 Nausea with vomiting, unspecified: Secondary | ICD-10-CM | POA: Diagnosis not present

## 2024-07-09 DIAGNOSIS — R1013 Epigastric pain: Secondary | ICD-10-CM | POA: Diagnosis not present

## 2024-07-09 DIAGNOSIS — C119 Malignant neoplasm of nasopharynx, unspecified: Secondary | ICD-10-CM | POA: Diagnosis not present

## 2024-07-09 LAB — BASIC METABOLIC PANEL WITH GFR
Anion gap: 9 (ref 5–15)
BUN: 9 mg/dL (ref 6–20)
CO2: 28 mmol/L (ref 22–32)
Calcium: 9.2 mg/dL (ref 8.9–10.3)
Chloride: 101 mmol/L (ref 98–111)
Creatinine, Ser: 0.73 mg/dL (ref 0.44–1.00)
GFR, Estimated: >60 mL/min (ref >60)
Glucose, Bld: 107 mg/dL — ABNORMAL HIGH (ref 70–99)
Potassium: 3.4 mmol/L — ABNORMAL LOW (ref 3.5–5.1)
Sodium: 138 mmol/L (ref 135–145)

## 2024-07-09 LAB — CBC
HCT: 32.6 % — ABNORMAL LOW (ref 36.0–46.0)
Hemoglobin: 10.8 g/dL — ABNORMAL LOW (ref 12.0–15.0)
MCH: 29.7 pg (ref 26.0–34.0)
MCHC: 33.1 g/dL (ref 30.0–36.0)
MCV: 89.6 fL (ref 80.0–100.0)
Platelets: 230 K/uL (ref 150–400)
RBC: 3.64 MIL/uL — ABNORMAL LOW (ref 3.87–5.11)
RDW: 14.6 % (ref 11.5–15.5)
WBC: 2.6 K/uL — ABNORMAL LOW (ref 4.0–10.5)
nRBC: 0 % (ref 0.0–0.2)

## 2024-07-09 LAB — GLUCOSE, CAPILLARY
Glucose-Capillary: 116 mg/dL — ABNORMAL HIGH (ref 70–99)
Glucose-Capillary: 96 mg/dL (ref 70–99)

## 2024-07-09 MED ORDER — POTASSIUM CHLORIDE 20 MEQ PO PACK
40.0000 meq | PACK | Freq: Once | ORAL | Status: AC
  Administered 2024-07-09: 40 meq
  Filled 2024-07-09: qty 2

## 2024-07-09 MED ORDER — ONDANSETRON HCL 8 MG PO TABS
8.0000 mg | ORAL_TABLET | Freq: Three times a day (TID) | ORAL | 0 refills | Status: DC | PRN
  Filled 2024-07-09: qty 20, 7d supply, fill #0

## 2024-07-09 MED ORDER — ADULT MULTIVITAMIN W/MINERALS CH
1.0000 | ORAL_TABLET | Freq: Every day | ORAL | 0 refills | Status: DC
  Filled 2024-07-09: qty 30, 30d supply, fill #0

## 2024-07-09 MED ORDER — AMLODIPINE BESYLATE 5 MG PO TABS
5.0000 mg | ORAL_TABLET | Freq: Every day | ORAL | 0 refills | Status: DC
  Filled 2024-07-09: qty 30, 30d supply, fill #0

## 2024-07-09 MED ORDER — HYDROCODONE-ACETAMINOPHEN 7.5-325 MG/15ML PO SOLN
10.0000 mL | ORAL | 0 refills | Status: AC | PRN
  Filled 2024-07-09: qty 300, 5d supply, fill #0

## 2024-07-09 MED ORDER — POTASSIUM CHLORIDE 20 MEQ PO PACK
20.0000 meq | PACK | Freq: Every day | ORAL | 0 refills | Status: DC
  Filled 2024-07-09: qty 7, 7d supply, fill #0

## 2024-07-09 MED ORDER — LACTULOSE 10 GM/15ML PO SOLN
30.0000 g | Freq: Every day | ORAL | 0 refills | Status: DC | PRN
  Filled 2024-07-09: qty 473, 10d supply, fill #0

## 2024-07-09 MED ORDER — FREE WATER: 60.0000 mL | Freq: Every day | Status: AC

## 2024-07-09 MED ORDER — DOCUSATE SODIUM 50 MG/5ML PO LIQD
50.0000 mg | Freq: Two times a day (BID) | ORAL | 0 refills | Status: DC
  Filled 2024-07-09: qty 300, 30d supply, fill #0

## 2024-07-09 MED ORDER — VITAMIN B-1 100 MG PO TABS
100.0000 mg | ORAL_TABLET | Freq: Every day | ORAL | 0 refills | Status: DC
  Filled 2024-07-09: qty 7, 7d supply, fill #0

## 2024-07-09 MED ORDER — POTASSIUM CHLORIDE ER 10 MEQ PO TBCR
10.0000 meq | EXTENDED_RELEASE_TABLET | Freq: Every day | ORAL | 0 refills | Status: DC
  Filled 2024-07-09: qty 7, 7d supply, fill #0

## 2024-07-09 MED ORDER — OSMOLITE 1.5 CAL PO LIQD: 119.0000 mL | Freq: Every day | ORAL | Status: DC

## 2024-07-09 MED ORDER — LORATADINE 10 MG PO TABS
10.0000 mg | ORAL_TABLET | Freq: Every day | ORAL | Status: DC | PRN
  Administered 2024-07-09: 10 mg via ORAL
  Filled 2024-07-09: qty 1

## 2024-07-09 NOTE — Discharge Summary (Signed)
 Physician Discharge Summary   Patient: Brandi [name] MRN: [id number] DOB: Apr 09, 1996  Admit date:     07/03/2024  Discharge date: 07/09/24  Discharge Physician: Charlie Patterson   PCP: [name], MD   Recommendations at discharge:   Follow-up PCP 5 days Follow-up for radiation therapy tomorrow Follow-up with Dr. Jacobo.  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  She follows with oncology outpatient and has had prior admissions for similar problems.  Radiation has been causing significant pain, difficulty swallowing, and decreased taste.  Patient reports minimal p.o. intake.  She reports she has decreased energy, and is feeling weaker every day.  She has discussed possible PEG tube outpatient with the nutritionist and oncology team but has been fearful to initiate this as she thought it would spread her malignancy.  After further discussion today she is more amendable and would like to plan for G-tube placement now with IR.   11/5.  Patient had radiation therapy this morning.  PEG tube placed this afternoon.  Okay to start feeding this evening at 6 PM.  Blood pressure high will control pain and also start Norvasc . 11/6.  Patient had nausea vomiting this morning.  Having abdominal pain since procedure. 11/7.  CT scan unremarkable.  Patient's abdominal pain is little bit better.  Only tolerating 50 cc of feeding at a time. 11/8.  Patient had a bowel movement.  Still taking IV meds for pain.  Will convert over to PEG tube  meds today.  Patient states she is tolerating a little bit more of the feeding at a time. 11/9.  Patient stating she is in less pain.  Stating she is tolerating more of the tube feeds.  Wanting to go home.  Patient complains of some throat pain and states that secondary to the radiation.    Assessment and Plan: * Abdominal pain After PEG tube placement.  CT scan unremarkable.  Converted off IV pain medications and over to PEG tube pain medications.  Prescribed 5 days of Norco 7.5/325 liquid via PEG.  Can go back on her Norco 5/325 after that.  Nasopharyngeal carcinoma (HCC) Baseline EBV positive nasopharyngeal cancer followed by Dr. Jacobo outpatient On radiation and chemotherapy   Intractable nausea and vomiting Odynophagia  Noted intractable nausea and vomiting in setting of nasopharyngeal cancer on active radiation and chemotherapy-followed by Dr. Jacobo outpatient PEG tube placement on 11/5.   Patient will slowly get up to her goal tube feeds of 1 can 6 times per day.  Currently doing about a half a can. Antiemetic therapy Pain control   HTN (hypertension) Blood pressure improved with Norvasc .    Marijuana use Patient still smoking marijuana.  Patient can potentially develop cannabis hyperemesis syndrome which could be contributing to symptoms.  GERD (gastroesophageal reflux disease) Continue PPI  Asthma Stable from respiratory standpoint Continue home inhalers  Hypokalemia Replace for 7 days upon discharge   Other constipation Had bowel movement yesterday.  As needed lactulose.  Hyponatremia Improved         Consultants: Interventional radiology Procedures performed: PEG tube  placement Disposition: Home Diet recommendation:  Liquid diet DISCHARGE MEDICATION: Allergies as of 07/09/2024       Reactions   Shellfish Allergy Anaphylaxis   Tomato Other (See Comments)   Flares up exema        Medication List     STOP taking these medications     acetaminophen  325 MG tablet Commonly known as: TYLENOL    gentamicin ointment 0.1 % Commonly known as: GARAMYCIN   HYDROcodone -acetaminophen  5-325 MG tablet Commonly known as: NORCO/VICODIN Replaced by: HYDROcodone -acetaminophen  7.5-325 mg/15 ml solution   prochlorperazine 10 MG tablet Commonly known as: COMPAZINE       TAKE these medications    ALPRAZolam  0.25 MG tablet Commonly known as: XANAX  Take 1 tablet (0.25 mg total) by mouth at bedtime as needed for anxiety.   amLODipine  5 MG tablet Commonly known as: NORVASC  Place 1 tablet (5 mg total) into feeding tube daily.   CertaVite/Antioxidants Tabs Place 1 tablet into feeding tube daily.   cyclobenzaprine  10 MG tablet Commonly known as: FLEXERIL  Take 1 tablet (10 mg total) by mouth 3 (three) times daily as needed for muscle spasms.   feeding supplement (OSMOLITE 1.5 CAL) Liqd Place 119 mLs into feeding tube 6 (six) times daily.   free water Soln Place 60 mLs into feeding tube 6 (six) times daily.   HYDROcodone -acetaminophen  7.5-325 mg/15 ml solution Commonly known as: HYCET Take 10 mLs by mouth every 4 (four) hours as needed for up to 5 days for moderate pain (pain score 4-6) or severe pain (pain score 7-10). Replaces: HYDROcodone -acetaminophen  5-325 MG tablet   lactulose 10 GM/15ML solution Commonly known as: CHRONULAC Take 45 mLs (30 g total) by mouth daily as needed for severe constipation.   lidocaine  2 % solution Commonly known as: XYLOCAINE  Use as directed 15 mLs in the mouth or throat every 3 (three) hours as needed for mouth pain. swish and swallow.   ondansetron  8 MG tablet Commonly known as: ZOFRAN  Place 1 tablet (8 mg total) into feeding tube every 8 (eight) hours as needed for nausea or vomiting. Start on the third day after cisplatin. What changed: how to take this   pantoprazole  40 MG tablet Commonly known as: Protonix  Take 1 tablet (40 mg total) by mouth daily.   phenol 1.4 %  Liqd Commonly known as: CHLORASEPTIC Use as directed 1 spray in the mouth or throat as needed for throat irritation / pain.   potassium chloride  10 MEQ tablet Commonly known as: KLOR-CON  Take 1 tablet (10 mEq total) by mouth daily.   senna-docusate 8.6-50 MG tablet Commonly known as: Senokot-S Take 1 tablet by mouth 2 (two) times daily.   Slynd  4 MG Tabs Generic drug: Drospirenone  Take 1 tablet by mouth daily.   thiamine 100 MG tablet Commonly known as: VITAMIN B1 Place 1 tablet (100 mg total) into feeding tube daily.               Durable Medical Equipment  (From admission, onward)           Start     Ordered   07/05/24 1359  For home use only DME Tube feeding  Once       Comments: 237 ml Osmolite 1.5 (or formulary equivalent) 6 times daily   75 ml free water flush before and after each feeding administration   Tube feeding regimen provides 2130 kcal (100% of needs), 89 grams of protein, and 1086 ml of H2O.  Total free water: 1986 ml daily  07/05/24 1401            Follow-up Information     [name], MD Follow up.   Specialty: Internal Medicine Why: Office closed today Patient to make own follow up appt  hospital follow up Contact information: [address] [facility] [address] [address] [phone]                Discharge Exam: Filed Weights   07/07/24 0500 07/08/24 0442 07/09/24 0500  Weight: 92.5 kg 94.9 kg 94 kg   Physical Exam HENT:     Head: Normocephalic.     Mouth/Throat:     Comments: Slight erythema back of throat but no exudate. Eyes:     General: Lids are normal.     Conjunctiva/sclera: Conjunctivae normal.  Cardiovascular:     Rate and Rhythm: Normal rate and regular rhythm.     Heart sounds: Normal heart sounds, S1 normal and S2 normal.  Pulmonary:     Breath sounds: No decreased breath sounds, wheezing, rhonchi or rales.  Abdominal:     Palpations: Abdomen is soft.     Tenderness: There is abdominal  tenderness in the epigastric area.  Musculoskeletal:     Right lower leg: No swelling.     Left lower leg: No swelling.  Skin:    General: Skin is warm.  Neurological:     Mental Status: She is alert and oriented to person, place, and time.      Condition at discharge: stable  The results of significant diagnostics from this hospitalization (including imaging, microbiology, ancillary and laboratory) are listed below for reference.   Imaging Studies: CT ABDOMEN PELVIS W CONTRAST Result Date: 07/07/2024 CLINICAL DATA:  Abdominal pain status post gastrostomy placement EXAM: CT ABDOMEN AND PELVIS WITH CONTRAST TECHNIQUE: Multidetector CT imaging of the abdomen and pelvis was performed using the standard protocol following bolus administration of intravenous contrast. RADIATION DOSE REDUCTION: This exam was performed according to the departmental dose-optimization program which includes automated exposure control, adjustment of the mA and/or kV according to patient size and/or use of iterative reconstruction technique. CONTRAST:  OMNIPAQUE  IOHEXOL  300 MG/ML  SOLN COMPARISON:  CT abdomen and pelvis dated 06/18/2024 FINDINGS: Lower chest: No focal consolidation or pulmonary nodule in the lung bases. No pleural effusion or pneumothorax demonstrated. Partially imaged heart size is normal. Hepatobiliary: No focal hepatic lesions. No intra or extrahepatic biliary ductal dilation. Normal gallbladder. Pancreas: No focal lesions or main ductal dilation. Spleen: Normal in size without focal abnormality. Adrenals/Urinary Tract: No adrenal nodules. No suspicious renal mass, calculi or hydronephrosis. No focal bladder wall thickening. Stomach/Bowel: Percutaneous gastrostomy tube in-situ. T tacks remain, tenting the anterior gastric wall. No evidence of bowel wall thickening, distention, or inflammatory changes. Normal appendix. Vascular/Lymphatic: No significant vascular findings are present. No enlarged  abdominal or pelvic lymph nodes. Reproductive: No adnexal masses. Other: Punctate foci of free air along the anterior midline peritoneum. No free fluid or fluid collection. Musculoskeletal: No acute or abnormal lytic or blastic osseous lesions. IMPRESSION: 1. Percutaneous gastrostomy tube in-situ. 2. Punctate foci of free air along the anterior midline peritoneum, likely related to recent gastrostomy tube placement. Electronically Signed   By: Limin  Xu M.D.   On: 07/07/2024 15:48   IR GASTROSTOMY TUBE MOD SED Result Date: 07/05/2024 INDICATION: 28 year old female with nasopharyngeal carcinoma currently on therapy but struggling with dysphagia. She presents for placement of a percutaneous gastrostomy tube for supplemental nutrition. EXAM: Fluoroscopically guided placement of percutaneous push  in balloon retention gastrostomy tube Interventional Radiologist:  Wilkie LOIS Lent, MD MEDICATIONS: 1 mg glucagon and 4 mg Zofran  administered intravenously by the Radiology nurse ANESTHESIA/SEDATION: Versed  5 mg IV; Fentanyl  100 mcg IV and 1 mg Dilaudid administered intravenously by the Radiology nurse Moderate Sedation Time:  25 minutes The patient's vital signs and level of consciousness were continuously monitored during the procedure by the interventional radiology nurse under my direct supervision. CONTRAST:  10mL OMNIPAQUE  IOHEXOL  300 MG/ML SOLN FLUOROSCOPY: Radiation exposure index: 26.3 mGy, air kerma COMPLICATIONS: None immediate. PROCEDURE: Informed written consent was obtained from the patient after a thorough discussion of the procedural risks, benefits and alternatives. All questions were addressed. Maximal Sterile Barrier Technique was utilized including caps, mask, sterile gowns, sterile gloves, sterile drape, hand hygiene and skin antiseptic. A timeout was performed prior to the initiation of the procedure. Maximal barrier sterile technique utilized including caps, mask, sterile gowns, sterile gloves,  large sterile drape, hand hygiene, and chlorhexadine skin prep. An angled catheter was advanced over a wire under fluoroscopic guidance through the nose, down the esophagus and into the body of the stomach. The stomach was then insufflated with several 100 ml of air. Fluoroscopy confirmed location of the gastric bubble, as well as inferior displacement of the barium stained colon. Under direct fluoroscopic guidance, two T-tacks were placed, and the anterior gastric wall drawn up against the anterior abdominal wall. Percutaneous access was then obtained into the mid gastric body with an 18 gauge trocar needle. Aspiration of air, and injection of contrast material under fluoroscopy confirmed needle placement. An Amplatz wire was advanced in the gastric body and the access needle was removed. A 10 x 100 mm Athletis balloon was then advanced coaxially through a 18 French balloon retention percutaneous gastrostomy tube. The balloon/gastrostomy tube assembly was then advanced over the wire. The balloon was advanced through the anterior abdominal wall along the percutaneous tract. The balloon was then inflated to full effacement dilating the tract. As the balloon was deflated, the assembly was advanced over the wire and into the stomach carrying the gastrostomy tube into the stomach. The retention balloon was filled with 10 mL saline. Contrast was injected through the tube confirming its location within the stomach. The tube was then secured with the external bumper and capped. The patient will be observed for several hours with the newly placed tube on low wall suction to evaluate for any post procedure complication. The patient tolerated the procedure well, there is no immediate complication. IMPRESSION: Successful placement of an 35 French balloon retention gastrostomy tube. Electronically Signed   By: Wilkie Lent M.D.   On: 07/05/2024 14:29   DG Chest 1 View Result Date: 07/03/2024 EXAM: 1 VIEW(S) XRAY OF THE  CHEST 07/03/2024 05:10:42 AM COMPARISON: PA and lateral radiographs of the chest dated 10/31/2012. CLINICAL HISTORY: worsening weakness FINDINGS: LINES, TUBES AND DEVICES: Monitor wires noted. LUNGS AND PLEURA: No focal pulmonary opacity. No pulmonary edema. No pleural effusion. No pneumothorax. HEART AND MEDIASTINUM: No acute abnormality of the cardiac and mediastinal silhouettes. BONES AND SOFT TISSUES: No acute osseous abnormality. IMPRESSION: 1. No acute process. Electronically signed by: Evalene Coho MD 07/03/2024 05:20 AM EST RP Workstation: [id number]   CT ABDOMEN PELVIS W CONTRAST Result Date: 06/18/2024 EXAM: CT ABDOMEN AND PELVIS WITH CONTRAST 06/18/2024 12:25:32 PM TECHNIQUE: CT of the abdomen and pelvis was performed with the administration of 100 mL of iohexol  (OMNIPAQUE ) 300 MG/ML solution. Multiplanar reformatted images are provided for review. Automated exposure  control, iterative reconstruction, and/or weight-based adjustment of the mA/kV was utilized to reduce the radiation dose to as low as reasonably achievable. COMPARISON: PET CT fusion study dated 05/26/2024 and CT of the abdomen and pelvis dated 04/05/2022. CLINICAL HISTORY: left lower back/abdominal pain. Pt comes with c/o back pain and some lower belly pain. Pt states she had 1st round of chemo on Wed and radiation everyday except the weekend. Pt states since that her back has been hurting. Pt states she thinks they didn't flush her out good enough. Pt states she only got two bags of fluids and it was suppose to be 4. FINDINGS: LOWER CHEST: No acute abnormality. LIVER: The liver is unremarkable. GALLBLADDER AND BILE DUCTS: Gallbladder is unremarkable. No biliary ductal dilatation. SPLEEN: No acute abnormality. PANCREAS: No acute abnormality. ADRENAL GLANDS: No acute abnormality. KIDNEYS, URETERS AND BLADDER: No stones in the kidneys or ureters. No hydronephrosis. No perinephric or periureteral stranding. Urinary bladder is  unremarkable. GI AND BOWEL: There are few scattered colonic diverticula. Stomach demonstrates no acute abnormality. There is no bowel obstruction. PERITONEUM AND RETROPERITONEUM: No ascites. No free air. VASCULATURE: Aorta is normal in caliber. LYMPH NODES: No lymphadenopathy. REPRODUCTIVE ORGANS: No acute abnormality. BONES AND SOFT TISSUES: No acute osseous abnormality. No focal soft tissue abnormality. IMPRESSION: 1. No acute findings in the abdomen or pelvis. 2. Scattered colonic diverticula without evidence of diverticulitis. Electronically signed by: Evalene Coho MD 06/18/2024 12:55 PM EDT RP Workstation: [id number]    Microbiology: Results for orders placed or performed during the hospital encounter of 05/16/24  Aerobic/Anaerobic Culture w Gram Stain (surgical/deep wound)     Status: None   Collection Time: 05/17/24  3:55 PM   Specimen: Tissue  Result Value Ref Range Status   Specimen Description   Final    TISSUE Performed at [facility], [address], [place], [place] [address]    Special Requests   Final    RT LYMPH NODE Performed at [facility], [address] [place], [place] [address]    Gram Stain NO WBC SEEN NO ORGANISMS SEEN   Final   Culture   Final    No growth aerobically or anaerobically. Performed at [facility], [address] [place], [place] [address]    Report Status 05/22/2024 FINAL  Final    Labs: CBC: Recent Labs  Lab 07/03/24 0525 07/04/24 0435 07/06/24 0424 07/07/24 0516 07/09/24 0446  WBC 3.6* 2.9* 4.0 3.9* 2.6*  NEUTROABS 2.8  --   --   --   --   HGB 11.7* 10.6* 10.7* 11.3* 10.8*  HCT 35.1* 31.7* 32.6* 34.2* 32.6*  MCV 87.5 88.5 90.1 89.3 89.6  PLT 383 332 275 257 230   Basic Metabolic Panel: Recent Labs  Lab 07/03/24 0525 07/03/24 2236 07/04/24 0435 07/05/24 1516 07/06/24 0424 07/07/24 0516 07/08/24 0800 07/09/24 0446  NA 133*  --  137  --  136 136 135 138  K 3.1*   < > 3.6  --  3.4* 3.4* 3.6  3.4*  CL 95*  --  103  --  99 99 98 101  CO2 20*  --  25  --  27 26 26 28   GLUCOSE 97  --  88  --  106* 96 149* 107*  BUN 13  --  7  --  6 7 8 9   CREATININE 0.84  --  0.71  --  0.65 0.85 0.78 0.73  CALCIUM 9.3  --  9.3  --  9.1 9.3 9.3 9.2  MG 1.9  --   --  1.7 1.8 2.0 2.0  --   PHOS  --   --   --  3.4 3.9 4.0 3.5  --    < > = values in this interval not displayed.   Liver Function Tests: Recent Labs  Lab 07/03/24 0525 07/04/24 0435  AST 28 22  ALT 54* 42  ALKPHOS 54 48  BILITOT 1.0 0.9  PROT 8.2* 6.5  ALBUMIN 4.0 3.5   CBG: Recent Labs  Lab 07/08/24 1653 07/08/24 2006 07/08/24 2349 07/09/24 0439 07/09/24 0907  GLUCAP 143* 117* 160* 116* 96    Discharge time spent: greater than 30 minutes.  Signed: Charlie Patterson, MD [facility] 07/09/2024

## 2024-07-09 NOTE — Plan of Care (Signed)

## 2024-07-10 ENCOUNTER — Telehealth: Payer: Self-pay

## 2024-07-10 ENCOUNTER — Other Ambulatory Visit: Payer: Self-pay

## 2024-07-10 ENCOUNTER — Ambulatory Visit
Admission: RE | Admit: 2024-07-10 | Discharge: 2024-07-10 | Disposition: A | Discharge status: Home / Self Care | Source: Ambulatory Visit | Attending: Radiation Oncology | Admitting: Radiation Oncology

## 2024-07-10 DIAGNOSIS — R52 Pain, unspecified: Secondary | ICD-10-CM

## 2024-07-10 DIAGNOSIS — E876 Hypokalemia: Secondary | ICD-10-CM

## 2024-07-10 LAB — RAD ONC ARIA SESSION SUMMARY
Course Elapsed Days: 27
Plan Fractions Treated to Date: 15
Plan Prescribed Dose Per Fraction: 2 Gy
Plan Total Fractions Prescribed: 35
Plan Total Prescribed Dose: 70 Gy
Reference Point Dosage Given to Date: 30 Gy
Reference Point Session Dosage Given: 2 Gy
Session Number: 15

## 2024-07-11 ENCOUNTER — Ambulatory Visit
Admission: RE | Admit: 2024-07-11 | Discharge: 2024-07-11 | Disposition: A | Discharge status: Home / Self Care | Source: Ambulatory Visit | Attending: Radiation Oncology | Admitting: Radiation Oncology

## 2024-07-11 ENCOUNTER — Other Ambulatory Visit: Payer: Self-pay

## 2024-07-11 LAB — RAD ONC ARIA SESSION SUMMARY
Course Elapsed Days: 28
Plan Fractions Treated to Date: 16
Plan Prescribed Dose Per Fraction: 2 Gy
Plan Total Fractions Prescribed: 35
Plan Total Prescribed Dose: 70 Gy
Reference Point Dosage Given to Date: 32 Gy
Reference Point Session Dosage Given: 2 Gy
Session Number: 16

## 2024-07-12 ENCOUNTER — Ambulatory Visit: Admitting: Oncology

## 2024-07-12 ENCOUNTER — Ambulatory Visit

## 2024-07-12 ENCOUNTER — Other Ambulatory Visit

## 2024-07-13 ENCOUNTER — Ambulatory Visit
Admission: RE | Admit: 2024-07-13 | Discharge: 2024-07-13 | Disposition: A | Discharge status: Home / Self Care | Source: Ambulatory Visit | Attending: Radiation Oncology | Admitting: Radiation Oncology

## 2024-07-13 ENCOUNTER — Inpatient Hospital Stay

## 2024-07-13 ENCOUNTER — Encounter: Payer: Self-pay | Admitting: Oncology

## 2024-07-13 ENCOUNTER — Other Ambulatory Visit: Payer: Self-pay

## 2024-07-13 ENCOUNTER — Inpatient Hospital Stay (HOSPITAL_BASED_OUTPATIENT_CLINIC_OR_DEPARTMENT_OTHER): Admitting: Oncology

## 2024-07-13 ENCOUNTER — Other Ambulatory Visit: Payer: Self-pay | Admitting: *Deleted

## 2024-07-13 ENCOUNTER — Ambulatory Visit

## 2024-07-13 VITALS — BP 120/90 | HR 104 | Temp 98.7°F | Resp 18 | Ht 62.0 in | Wt 197.0 lb

## 2024-07-13 VITALS — BP 122/85 | HR 108 | Temp 97.0°F | Resp 18

## 2024-07-13 DIAGNOSIS — C119 Malignant neoplasm of nasopharynx, unspecified: Secondary | ICD-10-CM | POA: Diagnosis not present

## 2024-07-13 LAB — CBC WITH DIFFERENTIAL (CANCER CENTER ONLY)
Abs Immature Granulocytes: 0.02 K/uL (ref 0.00–0.07)
Basophils Absolute: 0 K/uL (ref 0.0–0.1)
Basophils Relative: 0 %
Eosinophils Absolute: 0.1 K/uL (ref 0.0–0.5)
Eosinophils Relative: 3 %
HCT: 32.8 % — ABNORMAL LOW (ref 36.0–46.0)
Hemoglobin: 10.9 g/dL — ABNORMAL LOW (ref 12.0–15.0)
Immature Granulocytes: 1 %
Lymphocytes Relative: 8 %
Lymphs Abs: 0.2 K/uL — ABNORMAL LOW (ref 0.7–4.0)
MCH: 30.3 pg (ref 26.0–34.0)
MCHC: 33.2 g/dL (ref 30.0–36.0)
MCV: 91.1 fL (ref 80.0–100.0)
Monocytes Absolute: 0.2 K/uL (ref 0.1–1.0)
Monocytes Relative: 7 %
Neutro Abs: 2.1 K/uL (ref 1.7–7.7)
Neutrophils Relative %: 81 %
Platelet Count: 218 K/uL (ref 150–400)
RBC: 3.6 MIL/uL — ABNORMAL LOW (ref 3.87–5.11)
RDW: 16.1 % — ABNORMAL HIGH (ref 11.5–15.5)
WBC Count: 2.7 K/uL — ABNORMAL LOW (ref 4.0–10.5)
nRBC: 0 % (ref 0.0–0.2)

## 2024-07-13 LAB — BASIC METABOLIC PANEL - CANCER CENTER ONLY
Anion gap: 12 (ref 5–15)
BUN: 10 mg/dL (ref 6–20)
CO2: 28 mmol/L (ref 22–32)
Calcium: 9.3 mg/dL (ref 8.9–10.3)
Chloride: 96 mmol/L — ABNORMAL LOW (ref 98–111)
Creatinine: 0.86 mg/dL (ref 0.44–1.00)
GFR, Estimated: >60 mL/min (ref >60)
Glucose, Bld: 124 mg/dL — ABNORMAL HIGH (ref 70–99)
Potassium: 3.4 mmol/L — ABNORMAL LOW (ref 3.5–5.1)
Sodium: 136 mmol/L (ref 135–145)

## 2024-07-13 LAB — PREGNANCY, URINE: Preg Test, Ur: NEGATIVE

## 2024-07-13 LAB — RAD ONC ARIA SESSION SUMMARY
Course Elapsed Days: 30
Plan Fractions Treated to Date: 17
Plan Prescribed Dose Per Fraction: 2 Gy
Plan Total Fractions Prescribed: 35
Plan Total Prescribed Dose: 70 Gy
Reference Point Dosage Given to Date: 34 Gy
Reference Point Session Dosage Given: 2 Gy
Session Number: 17

## 2024-07-13 LAB — MAGNESIUM: Magnesium: 2.2 mg/dL (ref 1.7–2.4)

## 2024-07-13 LAB — PHOSPHORUS: Phosphorus: 4.3 mg/dL (ref 2.5–4.6)

## 2024-07-13 MED ORDER — SODIUM CHLORIDE 0.9 % IV SOLN
40.0000 mg/m2 | Freq: Once | INTRAVENOUS | Status: AC
  Administered 2024-07-13: 84 mg via INTRAVENOUS
  Filled 2024-07-13: qty 84

## 2024-07-13 MED ORDER — ENSURE CLEAR PO LIQD: 1.0000 | Freq: Two times a day (BID) | ORAL | 6 refills | Status: AC

## 2024-07-13 MED ORDER — MAGNESIUM SULFATE 2 GM/50ML IV SOLN
2.0000 g | Freq: Once | INTRAVENOUS | Status: AC
  Administered 2024-07-13: 2 g via INTRAVENOUS
  Filled 2024-07-13: qty 50

## 2024-07-13 MED ORDER — LORAZEPAM 2 MG/ML IJ SOLN
1.0000 mg | Freq: Once | INTRAMUSCULAR | Status: AC
  Administered 2024-07-13: 1 mg via INTRAVENOUS
  Filled 2024-07-13: qty 1

## 2024-07-13 MED ORDER — DEXAMETHASONE SOD PHOSPHATE PF 10 MG/ML IJ SOLN
10.0000 mg | Freq: Once | INTRAMUSCULAR | Status: AC
  Administered 2024-07-13: 10 mg via INTRAVENOUS

## 2024-07-13 MED ORDER — SODIUM CHLORIDE 0.9 % IV SOLN
INTRAVENOUS | Status: DC
  Filled 2024-07-13 (×2): qty 250

## 2024-07-13 MED ORDER — APREPITANT 130 MG/18ML IV EMUL
130.0000 mg | Freq: Once | INTRAVENOUS | Status: AC
  Administered 2024-07-13: 130 mg via INTRAVENOUS
  Filled 2024-07-13: qty 18

## 2024-07-13 MED ORDER — PALONOSETRON HCL INJECTION 0.25 MG/5ML
0.2500 mg | Freq: Once | INTRAVENOUS | Status: AC
  Administered 2024-07-13: 0.25 mg via INTRAVENOUS
  Filled 2024-07-13: qty 5

## 2024-07-13 MED ORDER — POTASSIUM CHLORIDE IN NACL 20-0.9 MEQ/L-% IV SOLN
Freq: Once | INTRAVENOUS | Status: AC
  Filled 2024-07-13: qty 1000

## 2024-07-13 NOTE — Progress Notes (Addendum)
 Nutrition Follow-up:  Patient with stage III EBV positive nasopharyngeal carcinoma.  Patient admitted to hospital for second time and PEG (18 Fr) placed on 11/5.    Met with patient during infusion.  Woke patient on arrival to chairside.  Patient says that she has been giving 1-1 1/2 cartons of formula via feeding tube.  Says that she can't do much more due to nausea.  Says she does not have a schedule of when to do the feeding.  Says that she is drinking liquids orally but has thick mucus in the back of throat that makes her nauseated.  Likes ensure clear but does not like boost breeze.  Unable to tolerate milky oral nutrition supplements.   Says that she has received a call from [employer] [facility] regarding enteral nutrition.   Observed PEG tube site and noted T-tac remaining.  Patient says that other one came off.    Medications: zofran , lactulose, senokot  Labs: K 3.4, phosphorus 3.5, Mag 2.0  Anthropometrics:   Weight 197 lb   207 lb 3.7 on 11/9  204 lb 2 oz on 10/29 210 lb on 10/22 219 lb on 10/19 216 lb on 8/21 235 lb on 02/29/24 233 lb on 08/04/24   Estimated Energy Needs  Kcals: 2000-2300 Protein: 95-114 g Fluid: > 2000 ml  NUTRITION DIAGNOSIS: Inadequate oral intake continues   MALNUTRITION DIAGNOSIS:  severe malnutrition continues   INTERVENTION:  Recommend 1/2 carton of osmolite 1.5 given at 8 am and 9 am, then again at 11 am and noon, then again at 2 pm and 3 pm and again at 5 pm and 6 pm. Flush with 60ml of water before and after each feeding.  Written schedule given to patient.   Observed patient give 60ml water flush, 1/2 carton of osmolite 1.5 and 60ml water flush during infusion today.  Tolerated feeding well Reviewed how to clean PEG site with patient.  Encouraged patient to follow-up with [facility] to send enteral supplies.  Recommend ensure clear BID orally for added calories and protein if able to tolerate    MONITORING, EVALUATION, GOAL: weight  trends, tube feeding   NEXT VISIT: Thursday, Nov 20 during infusion  Anush Wiedeman B. Dasie SOLON, CSO, LDN Registered Dietitian [phone]

## 2024-07-13 NOTE — Progress Notes (Signed)
 [facility] CSW Progress Note  Clinical Social Work Intern met with patient in infusion suite to follow-up on need for community resources and emotional support. Intern provided empathetic listening and validated patient's frustration with continued health challenges, offering encouragement for pt's resilience so far. Patient shared things she is looking forward to post-treatment that help her remain positive, such as a visit to [place] to see her sister.    Interventions: Provided brief mental health counseling with regard to emotional strain of illness, treatment side effects, and life changes.  Provided phone number for Medicaid transportation benefit CSW sent message to [facility] asking them to call patient and explain she was in the hospital, continue Disability application. Provided direct contact for CSW and encouraged patient to call with any resource or support needs.      Follow Up Plan:  CSW Intern will see patient at next infusion 11/20    Brandi [name] Clinical Social Work Intern [facility]    Patient is participating in a Managed Medicaid Plan:  Yes

## 2024-07-13 NOTE — Progress Notes (Unsigned)
 Patient came into clinic this morning feeling very weak, dizzy, anxious, and nauseas, with vomiting, she didn't take her nausea medication this morning. She is having pain when she vomits where her feeding tube is at. She keeps feeling like mucus is stuck, which she isn't coughing at all to help get it out.

## 2024-07-13 NOTE — Progress Notes (Unsigned)
 [facility]  [phone] [phone]  ID: Brandi [name] OB: 04-29-1996  MR#: [id number]  RDW#:[id number]  Patient Care Team: [name], MD as PCP - General (Internal Medicine) [name], MD as Consulting Physician (Radiation Oncology) [name], MD as Consulting Physician (Oncology)  CHIEF COMPLAINT: Stage III EBV positive nasopharyngeal carcinoma.  INTERVAL HISTORY: Patient returns to clinic today for further evaluation, hospital follow-up, consideration of cycle 4 of weekly cisplatin.  During her last hospital stay, patient had PEG tube inserted.  She continues to have increased anxiety and difficulty swallowing.  Patient has noted increased nausea in the past several days despite not receiving treatment in over a week.  She has abdominal pain at the site of her PEG tube.  She has no neurologic complaints.  She denies any recent fevers.  She has no chest pain, shortness of breath, cough, or hemoptysis.  She denies any vomiting or diarrhea.  She has no urinary complaints.  Patient feels generally terrible, but offers no further specific complaints today.  REVIEW OF SYSTEMS:   Review of Systems  Constitutional:  Positive for malaise/fatigue. Negative for chills and fever.  HENT:  Negative for sore throat.   Respiratory: Negative.  Negative for cough, hemoptysis and shortness of breath.   Cardiovascular: Negative.  Negative for chest pain and leg swelling.  Gastrointestinal:  Positive for abdominal pain, nausea and vomiting. Negative for constipation.  Genitourinary: Negative.  Negative for dysuria.  Musculoskeletal: Negative.  Negative for back pain.  Skin: Negative.  Negative for rash.  Neurological: Negative.  Negative for dizziness, focal weakness, weakness and headaches.  Psychiatric/Behavioral:  The patient is nervous/anxious.     As per HPI. Otherwise, a complete review of systems is negative.  PAST MEDICAL  HISTORY: Past Medical History:  Diagnosis Date   Asthma    Cancer (HCC)    GERD (gastroesophageal reflux disease)    HTN (hypertension)     PAST SURGICAL HISTORY: Past Surgical History:  Procedure Laterality Date   dental procedure     IR GASTROSTOMY TUBE MOD SED  07/05/2024   IR US  LIVER BIOPSY  05/17/2024    FAMILY HISTORY: Family History  Problem Relation Age of Onset   Hypertension Maternal Grandmother    Diabetes Maternal Grandmother     ADVANCED DIRECTIVES (Y/N):  N  HEALTH MAINTENANCE: Social History   Tobacco Use   Smoking status: Former    Types: E-cigarettes   Smokeless tobacco: Never  Vaping Use   Vaping status: Former  Substance Use Topics   Alcohol use: Not Currently    Comment: social   Drug use: Yes    Types: Marijuana     Colonoscopy:  PAP:  Bone density:  Lipid panel:  Allergies  Allergen Reactions   Shellfish Allergy Anaphylaxis   Tomato Other (See Comments)    Flares up exema    Current Outpatient Medications  Medication Sig Dispense Refill   ALPRAZolam  (XANAX ) 0.25 MG tablet Take 1 tablet (0.25 mg total) by mouth at bedtime as needed for anxiety. 30 tablet 0   amLODipine  (NORVASC ) 5 MG tablet Place 1 tablet (5 mg total) into feeding tube daily. 30 tablet 0   cyclobenzaprine  (FLEXERIL ) 10 MG tablet Take 1 tablet (10 mg total) by mouth 3 (three) times daily as needed for muscle spasms. 90 tablet 0   HYDROcodone -acetaminophen  (HYCET) 7.5-325 mg/15 ml solution Take 10 mLs by mouth every 4 (four) hours as needed for up to 5 days for  moderate pain (pain score 4-6) or severe pain (pain score 7-10). 300 mL 0   lactulose (CHRONULAC) 10 GM/15ML solution Take 45 mLs (30 g total) by mouth daily as needed for severe constipation. 473 mL 0   lidocaine  (XYLOCAINE ) 2 % solution Use as directed 15 mLs in the mouth or throat every 3 (three) hours as needed for mouth pain. swish and swallow. 100 mL 1   Multiple Vitamin (MULTIVITAMIN WITH MINERALS) TABS  tablet Place 1 tablet into feeding tube daily. 30 tablet 0   Nutritional Supplements (FEEDING SUPPLEMENT, OSMOLITE 1.5 CAL,) LIQD Place 119 mLs into feeding tube 6 (six) times daily.     ondansetron  (ZOFRAN ) 8 MG tablet Place 1 tablet (8 mg total) into feeding tube every 8 (eight) hours as needed for nausea or vomiting. Start on the third day after cisplatin. 20 tablet 0   pantoprazole  (PROTONIX ) 40 MG tablet Take 1 tablet (40 mg total) by mouth daily. 30 tablet 11   potassium chloride  (KLOR-CON ) 10 MEQ tablet Take 1 tablet (10 mEq total) by mouth daily. 7 tablet 0   senna-docusate (SENOKOT-S) 8.6-50 MG tablet Take 1 tablet by mouth 2 (two) times daily. 15 tablet 0   SLYND  4 MG TABS Take 1 tablet by mouth daily.     thiamine (VITAMIN B-1) 100 MG tablet Place 1 tablet (100 mg total) into feeding tube daily. 7 tablet 0   Water For Irrigation, Sterile (FREE WATER) SOLN Place 60 mLs into feeding tube 6 (six) times daily.     Nutritional Supplements (ENSURE CLEAR) LIQD Take 1 Bottle by mouth 2 (two) times daily. 296 mL 6   phenol (CHLORASEPTIC) 1.4 % LIQD Use as directed 1 spray in the mouth or throat as needed for throat irritation / pain. (Patient not taking: Reported on 07/13/2024)     No current facility-administered medications for this visit.   Facility-Administered Medications Ordered in Other Visits  Medication Dose Route Frequency Provider Last Rate Last Admin   0.9 %  sodium chloride  infusion   Intravenous Continuous [name], MD 10 mL/hr at 07/13/24 1004 New Bag at 07/13/24 1004    OBJECTIVE: Vitals:   07/13/24 0917  BP: (!) 120/90  Pulse: (!) 104  Resp: 18  Temp: 98.7 F (37.1 C)  SpO2: 98%       Body mass index is 36.03 kg/m.    ECOG FS:0 - Asymptomatic  General: Well-developed, well-nourished, no acute distress. Eyes: Pink conjunctiva, anicteric sclera. HEENT: Normocephalic, moist mucous membranes. Lungs: No audible wheezing or coughing. Heart: Regular rate  and rhythm. Abdomen: Soft, nontender, no obvious distention.  PEG tube in place. Musculoskeletal: No edema, cyanosis, or clubbing. Neuro: Alert, answering all questions appropriately. Cranial nerves grossly intact. Skin: No rashes or petechiae noted. Psych: Normal affect.  LAB RESULTS:  Lab Results  Component Value Date   NA 136 07/13/2024   K 3.4 (L) 07/13/2024   CL 96 (L) 07/13/2024   CO2 28 07/13/2024   GLUCOSE 124 (H) 07/13/2024   BUN 10 07/13/2024   CREATININE 0.86 07/13/2024   CALCIUM 9.3 07/13/2024   PROT 6.5 07/04/2024   ALBUMIN 3.5 07/04/2024   AST 22 07/04/2024   ALT 42 07/04/2024   ALKPHOS 48 07/04/2024   BILITOT 0.9 07/04/2024   GFRNONAA >60 07/13/2024   GFRAA >60 12/05/2019    Lab Results  Component Value Date   WBC 2.7 (L) 07/13/2024   NEUTROABS 2.1 07/13/2024   HGB 10.9 (L) 07/13/2024  HCT 32.8 (L) 07/13/2024   MCV 91.1 07/13/2024   PLT 218 07/13/2024     STUDIES: CT ABDOMEN PELVIS W CONTRAST Result Date: 07/07/2024 CLINICAL DATA:  Abdominal pain status post gastrostomy placement EXAM: CT ABDOMEN AND PELVIS WITH CONTRAST TECHNIQUE: Multidetector CT imaging of the abdomen and pelvis was performed using the standard protocol following bolus administration of intravenous contrast. RADIATION DOSE REDUCTION: This exam was performed according to the departmental dose-optimization program which includes automated exposure control, adjustment of the mA and/or kV according to patient size and/or use of iterative reconstruction technique. CONTRAST:  OMNIPAQUE  IOHEXOL  300 MG/ML  SOLN COMPARISON:  CT abdomen and pelvis dated 06/18/2024 FINDINGS: Lower chest: No focal consolidation or pulmonary nodule in the lung bases. No pleural effusion or pneumothorax demonstrated. Partially imaged heart size is normal. Hepatobiliary: No focal hepatic lesions. No intra or extrahepatic biliary ductal dilation. Normal gallbladder. Pancreas: No focal lesions or main ductal  dilation. Spleen: Normal in size without focal abnormality. Adrenals/Urinary Tract: No adrenal nodules. No suspicious renal mass, calculi or hydronephrosis. No focal bladder wall thickening. Stomach/Bowel: Percutaneous gastrostomy tube in-situ. T tacks remain, tenting the anterior gastric wall. No evidence of bowel wall thickening, distention, or inflammatory changes. Normal appendix. Vascular/Lymphatic: No significant vascular findings are present. No enlarged abdominal or pelvic lymph nodes. Reproductive: No adnexal masses. Other: Punctate foci of free air along the anterior midline peritoneum. No free fluid or fluid collection. Musculoskeletal: No acute or abnormal lytic or blastic osseous lesions. IMPRESSION: 1. Percutaneous gastrostomy tube in-situ. 2. Punctate foci of free air along the anterior midline peritoneum, likely related to recent gastrostomy tube placement. Electronically Signed   By: Limin  Xu M.D.   On: 07/07/2024 15:48   IR GASTROSTOMY TUBE MOD SED Result Date: 07/05/2024 INDICATION: 28 year old female with nasopharyngeal carcinoma currently on therapy but struggling with dysphagia. She presents for placement of a percutaneous gastrostomy tube for supplemental nutrition. EXAM: Fluoroscopically guided placement of percutaneous push in balloon retention gastrostomy tube Interventional Radiologist:  Wilkie LOIS Lent, MD MEDICATIONS: 1 mg glucagon and 4 mg Zofran  administered intravenously by the Radiology nurse ANESTHESIA/SEDATION: Versed  5 mg IV; Fentanyl  100 mcg IV and 1 mg Dilaudid administered intravenously by the Radiology nurse Moderate Sedation Time:  25 minutes The patient's vital signs and level of consciousness were continuously monitored during the procedure by the interventional radiology nurse under my direct supervision. CONTRAST:  10mL OMNIPAQUE  IOHEXOL  300 MG/ML SOLN FLUOROSCOPY: Radiation exposure index: 26.3 mGy, air kerma COMPLICATIONS: None immediate. PROCEDURE: Informed  written consent was obtained from the patient after a thorough discussion of the procedural risks, benefits and alternatives. All questions were addressed. Maximal Sterile Barrier Technique was utilized including caps, mask, sterile gowns, sterile gloves, sterile drape, hand hygiene and skin antiseptic. A timeout was performed prior to the initiation of the procedure. Maximal barrier sterile technique utilized including caps, mask, sterile gowns, sterile gloves, large sterile drape, hand hygiene, and chlorhexadine skin prep. An angled catheter was advanced over a wire under fluoroscopic guidance through the nose, down the esophagus and into the body of the stomach. The stomach was then insufflated with several 100 ml of air. Fluoroscopy confirmed location of the gastric bubble, as well as inferior displacement of the barium stained colon. Under direct fluoroscopic guidance, two T-tacks were placed, and the anterior gastric wall drawn up against the anterior abdominal wall. Percutaneous access was then obtained into the mid gastric body with an 18 gauge trocar needle. Aspiration of air, and injection  of contrast material under fluoroscopy confirmed needle placement. An Amplatz wire was advanced in the gastric body and the access needle was removed. A 10 x 100 mm Athletis balloon was then advanced coaxially through a 18 French balloon retention percutaneous gastrostomy tube. The balloon/gastrostomy tube assembly was then advanced over the wire. The balloon was advanced through the anterior abdominal wall along the percutaneous tract. The balloon was then inflated to full effacement dilating the tract. As the balloon was deflated, the assembly was advanced over the wire and into the stomach carrying the gastrostomy tube into the stomach. The retention balloon was filled with 10 mL saline. Contrast was injected through the tube confirming its location within the stomach. The tube was then secured with the external  bumper and capped. The patient will be observed for several hours with the newly placed tube on low wall suction to evaluate for any post procedure complication. The patient tolerated the procedure well, there is no immediate complication. IMPRESSION: Successful placement of an 72 French balloon retention gastrostomy tube. Electronically Signed   By: Wilkie Lent M.D.   On: 07/05/2024 14:29   DG Chest 1 View Result Date: 07/03/2024 EXAM: 1 VIEW(S) XRAY OF THE CHEST 07/03/2024 05:10:42 AM COMPARISON: PA and lateral radiographs of the chest dated 10/31/2012. CLINICAL HISTORY: worsening weakness FINDINGS: LINES, TUBES AND DEVICES: Monitor wires noted. LUNGS AND PLEURA: No focal pulmonary opacity. No pulmonary edema. No pleural effusion. No pneumothorax. HEART AND MEDIASTINUM: No acute abnormality of the cardiac and mediastinal silhouettes. BONES AND SOFT TISSUES: No acute osseous abnormality. IMPRESSION: 1. No acute process. Electronically signed by: Evalene Coho MD 07/03/2024 05:20 AM EST RP Workstation: [id number]   CT ABDOMEN PELVIS W CONTRAST Result Date: 06/18/2024 EXAM: CT ABDOMEN AND PELVIS WITH CONTRAST 06/18/2024 12:25:32 PM TECHNIQUE: CT of the abdomen and pelvis was performed with the administration of 100 mL of iohexol  (OMNIPAQUE ) 300 MG/ML solution. Multiplanar reformatted images are provided for review. Automated exposure control, iterative reconstruction, and/or weight-based adjustment of the mA/kV was utilized to reduce the radiation dose to as low as reasonably achievable. COMPARISON: PET CT fusion study dated 05/26/2024 and CT of the abdomen and pelvis dated 04/05/2022. CLINICAL HISTORY: left lower back/abdominal pain. Pt comes with c/o back pain and some lower belly pain. Pt states she had 1st round of chemo on Wed and radiation everyday except the weekend. Pt states since that her back has been hurting. Pt states she thinks they didn't flush her out good enough. Pt states she only  got two bags of fluids and it was suppose to be 4. FINDINGS: LOWER CHEST: No acute abnormality. LIVER: The liver is unremarkable. GALLBLADDER AND BILE DUCTS: Gallbladder is unremarkable. No biliary ductal dilatation. SPLEEN: No acute abnormality. PANCREAS: No acute abnormality. ADRENAL GLANDS: No acute abnormality. KIDNEYS, URETERS AND BLADDER: No stones in the kidneys or ureters. No hydronephrosis. No perinephric or periureteral stranding. Urinary bladder is unremarkable. GI AND BOWEL: There are few scattered colonic diverticula. Stomach demonstrates no acute abnormality. There is no bowel obstruction. PERITONEUM AND RETROPERITONEUM: No ascites. No free air. VASCULATURE: Aorta is normal in caliber. LYMPH NODES: No lymphadenopathy. REPRODUCTIVE ORGANS: No acute abnormality. BONES AND SOFT TISSUES: No acute osseous abnormality. No focal soft tissue abnormality. IMPRESSION: 1. No acute findings in the abdomen or pelvis. 2. Scattered colonic diverticula without evidence of diverticulitis. Electronically signed by: Evalene Coho MD 06/18/2024 12:55 PM EDT RP Workstation: [id number]    ASSESSMENT: Stage III EBV positive nasopharyngeal carcinoma.  PLAN:  Stage III EBV positive nasopharyngeal carcinoma: PET scan results from May 26, 2024 reviewed independently confirming stage of disease.  Patient will benefit from concurrent chemotherapy using weekly cisplatin along with daily XRT.  We discussed the possibility of port placement, but patient previously declined.  Despite patient's increased nausea, we will proceed with cycle 4 of weekly cisplatin today.  Continue daily XRT.  Return to clinic in 1 week for further evaluation and consideration of cycle 5.   Poor appetite/dysphagia: Patient now has PEG tube in place.  Appreciate dietary input.  Continue tube feeds as prescribed.   Anxiety: Continue Xanax  as prescribed. Nausea: Continue Zofran  and Compazine as prescribed.  Patient also received IV  antiemetics in clinic today. Constipation: Patient does not complain of this today.  Continue stool softeners. Back pain: Patient does not complain of this today.  Continue Flexeril  as needed. Leukopenia: Chronic and unchanged.  Monitor. Anemia: Globin has trended down slightly to 10.9, monitor. Thrombocytosis: Resolved. Hyponatremia: Resolved. Hypokalemia: Patient's potassium level is 3.4 today.  Monitor. Dysphagia: Continue Magic mouthwash as prescribed.  PEG tube as above.   Patient expressed understanding and was in agreement with this plan. She also understands that She can call clinic at any time with any questions, concerns, or complaints.    Cancer Staging  Nasopharyngeal carcinoma [facility]) Staging form: Pharynx - Nasopharynx, AJCC V9 - Clinical stage from 05/30/2024: Stage III (cTX, cN3, cM0) - Signed by [name], MD on 05/30/2024 Stage prefix: Initial diagnosis Method of lymph node assessment: Clinical   Evalene PARAS Jacobo, MD   07/14/2024 10:27 AM

## 2024-07-14 ENCOUNTER — Other Ambulatory Visit: Payer: Self-pay

## 2024-07-14 ENCOUNTER — Telehealth: Payer: Self-pay | Admitting: *Deleted

## 2024-07-14 ENCOUNTER — Ambulatory Visit
Admission: RE | Admit: 2024-07-14 | Discharge: 2024-07-14 | Disposition: A | Discharge status: Home / Self Care | Source: Ambulatory Visit | Attending: Radiation Oncology | Admitting: Radiation Oncology

## 2024-07-14 ENCOUNTER — Encounter: Payer: Self-pay | Admitting: Oncology

## 2024-07-14 LAB — RAD ONC ARIA SESSION SUMMARY
Course Elapsed Days: 31
Plan Fractions Treated to Date: 18
Plan Prescribed Dose Per Fraction: 2 Gy
Plan Total Fractions Prescribed: 35
Plan Total Prescribed Dose: 70 Gy
Reference Point Dosage Given to Date: 36 Gy
Reference Point Session Dosage Given: 2 Gy
Session Number: 18

## 2024-07-14 NOTE — Progress Notes (Signed)
 Complex Care Management Note  Care Guide Note 07/14/2024 Name: Brandi [name] MRN: [id number] DOB: Sep 29, 1995  Brandi [name] is a 28 y.o. year old female who sees Osei-Bonsu, Zachary, MD for primary care. I reached out to [place] by phone today to offer complex care management services.  [name] was given information about Complex Care Management services today including:   The Complex Care Management services include support from the care team which includes your Nurse Care Manager, Clinical Social Worker, or Pharmacist.  The Complex Care Management team is here to help remove barriers to the health concerns and goals most important to you. Complex Care Management services are voluntary, and the patient may decline or stop services at any time by request to their care team member.   Complex Care Management Consent Status: Patient agreed to services and verbal consent obtained.   Follow up plan:  Telephone appointment with complex care management team member scheduled for:  07/28/2024  Encounter Outcome:  Patient Scheduled  Thedford Franks, CMA [place]  [facility], [facility] Guide Direct Dial: [phone]  Fax: [phone] Website: [email or web address]

## 2024-07-15 ENCOUNTER — Ambulatory Visit

## 2024-07-17 ENCOUNTER — Inpatient Hospital Stay

## 2024-07-17 ENCOUNTER — Other Ambulatory Visit: Payer: Self-pay

## 2024-07-17 ENCOUNTER — Encounter: Payer: Self-pay | Admitting: Hospice and Palliative Medicine

## 2024-07-17 ENCOUNTER — Other Ambulatory Visit: Payer: Self-pay | Admitting: *Deleted

## 2024-07-17 ENCOUNTER — Telehealth: Payer: Self-pay | Admitting: *Deleted

## 2024-07-17 ENCOUNTER — Ambulatory Visit
Admission: RE | Admit: 2024-07-17 | Discharge: 2024-07-17 | Disposition: A | Discharge status: Home / Self Care | Source: Ambulatory Visit | Attending: Radiation Oncology | Admitting: Radiation Oncology

## 2024-07-17 ENCOUNTER — Inpatient Hospital Stay (HOSPITAL_BASED_OUTPATIENT_CLINIC_OR_DEPARTMENT_OTHER): Admitting: Hospice and Palliative Medicine

## 2024-07-17 VITALS — BP 128/87 | HR 108 | Temp 98.8°F | Ht 62.0 in | Wt 198.1 lb

## 2024-07-17 DIAGNOSIS — R110 Nausea: Secondary | ICD-10-CM

## 2024-07-17 DIAGNOSIS — R531 Weakness: Secondary | ICD-10-CM

## 2024-07-17 DIAGNOSIS — C119 Malignant neoplasm of nasopharynx, unspecified: Secondary | ICD-10-CM

## 2024-07-17 DIAGNOSIS — R112 Nausea with vomiting, unspecified: Secondary | ICD-10-CM

## 2024-07-17 DIAGNOSIS — E876 Hypokalemia: Secondary | ICD-10-CM

## 2024-07-17 LAB — CMP (CANCER CENTER ONLY)
ALT: 49 U/L — ABNORMAL HIGH (ref 0–44)
AST: 29 U/L (ref 15–41)
Albumin: 3.6 g/dL (ref 3.5–5.0)
Alkaline Phosphatase: 57 U/L (ref 38–126)
Anion gap: 11 (ref 5–15)
BUN: 12 mg/dL (ref 6–20)
CO2: 24 mmol/L (ref 22–32)
Calcium: 9 mg/dL (ref 8.9–10.3)
Chloride: 98 mmol/L (ref 98–111)
Creatinine: 0.79 mg/dL (ref 0.44–1.00)
GFR, Estimated: >60 mL/min (ref >60)
Glucose, Bld: 148 mg/dL — ABNORMAL HIGH (ref 70–99)
Potassium: 3.1 mmol/L — ABNORMAL LOW (ref 3.5–5.1)
Sodium: 133 mmol/L — ABNORMAL LOW (ref 135–145)
Total Bilirubin: 0.5 mg/dL (ref 0.0–1.2)
Total Protein: 6.7 g/dL (ref 6.5–8.1)

## 2024-07-17 LAB — RAD ONC ARIA SESSION SUMMARY
Course Elapsed Days: 34
Plan Fractions Treated to Date: 19
Plan Prescribed Dose Per Fraction: 2 Gy
Plan Total Fractions Prescribed: 35
Plan Total Prescribed Dose: 70 Gy
Reference Point Dosage Given to Date: 38 Gy
Reference Point Session Dosage Given: 2 Gy
Session Number: 19

## 2024-07-17 LAB — CBC WITH DIFFERENTIAL (CANCER CENTER ONLY)
Abs Immature Granulocytes: 0 K/uL (ref 0.00–0.07)
Basophils Absolute: 0 K/uL (ref 0.0–0.1)
Basophils Relative: 0 %
Eosinophils Absolute: 0 K/uL (ref 0.0–0.5)
Eosinophils Relative: 0 %
HCT: 30.7 % — ABNORMAL LOW (ref 36.0–46.0)
Hemoglobin: 10.2 g/dL — ABNORMAL LOW (ref 12.0–15.0)
Immature Granulocytes: 0 %
Lymphocytes Relative: 8 %
Lymphs Abs: 0.2 K/uL — ABNORMAL LOW (ref 0.7–4.0)
MCH: 30.2 pg (ref 26.0–34.0)
MCHC: 33.2 g/dL (ref 30.0–36.0)
MCV: 90.8 fL (ref 80.0–100.0)
Monocytes Absolute: 0.2 K/uL (ref 0.1–1.0)
Monocytes Relative: 7 %
Neutro Abs: 1.9 K/uL (ref 1.7–7.7)
Neutrophils Relative %: 85 %
Platelet Count: 282 K/uL (ref 150–400)
RBC: 3.38 MIL/uL — ABNORMAL LOW (ref 3.87–5.11)
RDW: 16.2 % — ABNORMAL HIGH (ref 11.5–15.5)
WBC Count: 2.3 K/uL — ABNORMAL LOW (ref 4.0–10.5)
nRBC: 0 % (ref 0.0–0.2)

## 2024-07-17 MED ORDER — PROCHLORPERAZINE MALEATE 10 MG PO TABS: 10.0000 mg | ORAL_TABLET | Freq: Three times a day (TID) | ORAL | 0 refills | Status: AC | PRN

## 2024-07-17 MED ORDER — SODIUM CHLORIDE 0.9 % IV SOLN
INTRAVENOUS | Status: DC
  Filled 2024-07-17 (×2): qty 250

## 2024-07-17 MED ORDER — ONDANSETRON HCL 4 MG/2ML IJ SOLN
8.0000 mg | Freq: Once | INTRAMUSCULAR | Status: AC
  Administered 2024-07-17: 8 mg via INTRAVENOUS
  Filled 2024-07-17: qty 4

## 2024-07-17 MED ORDER — POTASSIUM CHLORIDE 10 MEQ/100ML IV SOLN
10.0000 meq | Freq: Once | INTRAVENOUS | Status: AC
  Administered 2024-07-17: 10 meq via INTRAVENOUS
  Filled 2024-07-17: qty 100

## 2024-07-17 MED ORDER — NYSTATIN 100000 UNIT/ML MT SUSP: 5.0000 mL | Freq: Four times a day (QID) | OROMUCOSAL | 0 refills | Status: DC

## 2024-07-17 MED ORDER — POTASSIUM CHLORIDE 20 MEQ/15ML (10%) PO SOLN: 20.0000 meq | Freq: Every day | ORAL | 0 refills | Status: DC

## 2024-07-17 NOTE — Telephone Encounter (Signed)
 Spoke with Josh, ok to add pt to sch at 115. Labs/ smc- possible IV fliuds. Msg sent to scheduling to add.

## 2024-07-17 NOTE — Progress Notes (Signed)
 [name] [phone] [phone]  Patient Care Team: [name], MD as PCP - General (Internal Medicine) [name], MD as Consulting Physician (Radiation Oncology) [name], MD as Consulting Physician (Oncology)   NAME OF PATIENT: Brandi [name]  [id number]  March 01, 1996   DATE OF VISIT: 07/17/24  REASON FOR CONSULT: Brandi [name] is a 28 y.o. 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  INTERVAL HISTORY: Patient hospitalized 07/03/2024 to 07/09/2024 with abdominal pain and intractable nausea and vomiting.  Patient had tolerated treatment poorly with significant pain, difficulty swallowing, and decreased oral intake.  Patient underwent PEG placement on 07/05/2024.  Patient had abdominal pain following PEG placement but CT scan was unremarkable.  Patient had follow-up with Dr. Jacobo 07/13/2024 and patient received cycle 4 weekly cisplatin with plan to continue daily XRT.  Patient presents to [facility] today with complaint of weakness and nausea.  Patient reports since her hospitalization, she has had sick oral secretions, which trigger nausea and vomiting.  She often spits this out.  Endorses postnasal drip.  Denies fever or chills.  Says st most, she may vomit once or twice a day.  Historically, she admits to not taking her medications as they clog her feeding tube.  Denies any neurologic complaints. Denies recent fevers or illnesses. Denies any easy bleeding or bruising.  Denies chest pain. Denies any constipation, or diarrhea. Denies urinary complaints. Patient offers no further specific complaints today.   PAST MEDICAL HISTORY: Past Medical History:  Diagnosis Date   Asthma    Cancer (HCC)    GERD (gastroesophageal reflux disease)    HTN (hypertension)     PAST SURGICAL HISTORY:  Past Surgical History:   Procedure Laterality Date   dental procedure     IR GASTROSTOMY TUBE MOD SED  07/05/2024   IR US  LIVER BIOPSY  05/17/2024    HEMATOLOGY/ONCOLOGY HISTORY:  Oncology History  Nasopharyngeal carcinoma (HCC)  05/19/2024 Initial Diagnosis   Nasopharyngeal carcinoma (HCC)   05/30/2024 Cancer Staging   Staging form: Pharynx - Nasopharynx, AJCC V9 - Clinical stage from 05/30/2024: Stage III (cTX, cN3, cM0) - Signed by [name], MD on 05/30/2024 Stage prefix: Initial diagnosis Method of lymph node assessment: Clinical   06/14/2024 -  Chemotherapy   Patient is on Treatment Plan : HEAD/NECK Cisplatin (40) q7d       ALLERGIES:  is allergic to shellfish allergy and tomato.  MEDICATIONS:  Current Outpatient Medications  Medication Sig Dispense Refill   ALPRAZolam  (XANAX ) 0.25 MG tablet Take 1 tablet (0.25 mg total) by mouth at bedtime as needed for anxiety. 30 tablet 0   amLODipine  (NORVASC ) 5 MG tablet Place 1 tablet (5 mg total) into feeding tube daily. 30 tablet 0   cyclobenzaprine  (FLEXERIL ) 10 MG tablet Take 1 tablet (10 mg total) by mouth 3 (three) times daily as needed for muscle spasms. 90 tablet 0   lactulose (CHRONULAC) 10 GM/15ML solution Take 45 mLs (30 g total) by mouth daily as needed for severe constipation. 473 mL 0   lidocaine  (XYLOCAINE ) 2 % solution Use as directed 15 mLs in the mouth or throat every 3 (three) hours as needed for mouth pain. swish and swallow. 100 mL 1   Multiple Vitamin (MULTIVITAMIN WITH MINERALS) TABS tablet Place 1 tablet into feeding tube daily. 30 tablet 0   Nutritional Supplements (ENSURE CLEAR) LIQD Take 1 Bottle by  mouth 2 (two) times daily. 296 mL 6   Nutritional Supplements (FEEDING SUPPLEMENT, OSMOLITE 1.5 CAL,) LIQD Place 119 mLs into feeding tube 6 (six) times daily.     ondansetron  (ZOFRAN ) 8 MG tablet Place 1 tablet (8 mg total) into feeding tube every 8 (eight) hours as needed for nausea or vomiting. Start on the third day after  cisplatin. 20 tablet 0   pantoprazole  (PROTONIX ) 40 MG tablet Take 1 tablet (40 mg total) by mouth daily. 30 tablet 11   phenol (CHLORASEPTIC) 1.4 % LIQD Use as directed 1 spray in the mouth or throat as needed for throat irritation / pain.     potassium chloride  (KLOR-CON ) 10 MEQ tablet Take 1 tablet (10 mEq total) by mouth daily. 7 tablet 0   senna-docusate (SENOKOT-S) 8.6-50 MG tablet Take 1 tablet by mouth 2 (two) times daily. 15 tablet 0   SLYND  4 MG TABS Take 1 tablet by mouth daily.     thiamine (VITAMIN B-1) 100 MG tablet Place 1 tablet (100 mg total) into feeding tube daily. 7 tablet 0   Water For Irrigation, Sterile (FREE WATER) SOLN Place 60 mLs into feeding tube 6 (six) times daily.     No current facility-administered medications for this visit.   Facility-Administered Medications Ordered in Other Visits  Medication Dose Route Frequency Provider Last Rate Last Admin   0.9 %  sodium chloride  infusion   Intravenous Continuous Finnegan, Timothy J, MD 10 mL/hr at 07/13/24 1004 New Bag at 07/13/24 1004    VITAL SIGNS: Ht 5' 2 (1.575 m)   Wt 198 lb 1.6 oz (89.9 kg)   BMI 36.23 kg/m  Filed Weights   07/17/24 1339  Weight: 198 lb 1.6 oz (89.9 kg)    Estimated body mass index is 36.23 kg/m as calculated from the following:   Height as of this encounter: 5' 2 (1.575 m).   Weight as of this encounter: 198 lb 1.6 oz (89.9 kg).  LABS: CBC:    Component Value Date/Time   WBC 2.3 (L) 07/17/2024 1328   WBC 2.6 (L) 07/09/2024 0446   HGB 10.2 (L) 07/17/2024 1328   HCT 30.7 (L) 07/17/2024 1328   PLT 282 07/17/2024 1328   MCV 90.8 07/17/2024 1328   NEUTROABS 1.9 07/17/2024 1328   LYMPHSABS 0.2 (L) 07/17/2024 1328   MONOABS 0.2 07/17/2024 1328   EOSABS 0.0 07/17/2024 1328   BASOSABS 0.0 07/17/2024 1328   Comprehensive Metabolic Panel:    Component Value Date/Time   NA 136 07/13/2024 0848   K 3.4 (L) 07/13/2024 0848   CL 96 (L) 07/13/2024 0848   CO2 28 07/13/2024 0848    BUN 10 07/13/2024 0848   CREATININE 0.86 07/13/2024 0848   GLUCOSE 124 (H) 07/13/2024 0848   CALCIUM 9.3 07/13/2024 0848   AST 22 07/04/2024 0435   AST 18 06/21/2024 0911   ALT 42 07/04/2024 0435   ALT 20 06/21/2024 0911   ALKPHOS 48 07/04/2024 0435   BILITOT 0.9 07/04/2024 0435   BILITOT 0.7 06/21/2024 0911   PROT 6.5 07/04/2024 0435   ALBUMIN 3.5 07/04/2024 0435    RADIOGRAPHIC STUDIES: CT ABDOMEN PELVIS W CONTRAST Result Date: 07/07/2024 CLINICAL DATA:  Abdominal pain status post gastrostomy placement EXAM: CT ABDOMEN AND PELVIS WITH CONTRAST TECHNIQUE: Multidetector CT imaging of the abdomen and pelvis was performed using the standard protocol following bolus administration of intravenous contrast. RADIATION DOSE REDUCTION: This exam was performed according to the departmental dose-optimization program which includes  automated exposure control, adjustment of the mA and/or kV according to patient size and/or use of iterative reconstruction technique. CONTRAST:  OMNIPAQUE  IOHEXOL  300 MG/ML  SOLN COMPARISON:  CT abdomen and pelvis dated 06/18/2024 FINDINGS: Lower chest: No focal consolidation or pulmonary nodule in the lung bases. No pleural effusion or pneumothorax demonstrated. Partially imaged heart size is normal. Hepatobiliary: No focal hepatic lesions. No intra or extrahepatic biliary ductal dilation. Normal gallbladder. Pancreas: No focal lesions or main ductal dilation. Spleen: Normal in size without focal abnormality. Adrenals/Urinary Tract: No adrenal nodules. No suspicious renal mass, calculi or hydronephrosis. No focal bladder wall thickening. Stomach/Bowel: Percutaneous gastrostomy tube in-situ. T tacks remain, tenting the anterior gastric wall. No evidence of bowel wall thickening, distention, or inflammatory changes. Normal appendix. Vascular/Lymphatic: No significant vascular findings are present. No enlarged abdominal or pelvic lymph nodes. Reproductive: No adnexal masses.  Other: Punctate foci of free air along the anterior midline peritoneum. No free fluid or fluid collection. Musculoskeletal: No acute or abnormal lytic or blastic osseous lesions. IMPRESSION: 1. Percutaneous gastrostomy tube in-situ. 2. Punctate foci of free air along the anterior midline peritoneum, likely related to recent gastrostomy tube placement. Electronically Signed   By: Limin  Xu M.D.   On: 07/07/2024 15:48   IR GASTROSTOMY TUBE MOD SED Result Date: 07/05/2024 INDICATION: 28 year old female with nasopharyngeal carcinoma currently on therapy but struggling with dysphagia. She presents for placement of a percutaneous gastrostomy tube for supplemental nutrition. EXAM: Fluoroscopically guided placement of percutaneous push in balloon retention gastrostomy tube Interventional Radiologist:  Wilkie LOIS Lent, MD MEDICATIONS: 1 mg glucagon and 4 mg Zofran  administered intravenously by the Radiology nurse ANESTHESIA/SEDATION: Versed  5 mg IV; Fentanyl  100 mcg IV and 1 mg Dilaudid administered intravenously by the Radiology nurse Moderate Sedation Time:  25 minutes The patient's vital signs and level of consciousness were continuously monitored during the procedure by the interventional radiology nurse under my direct supervision. CONTRAST:  10mL OMNIPAQUE  IOHEXOL  300 MG/ML SOLN FLUOROSCOPY: Radiation exposure index: 26.3 mGy, air kerma COMPLICATIONS: None immediate. PROCEDURE: Informed written consent was obtained from the patient after a thorough discussion of the procedural risks, benefits and alternatives. All questions were addressed. Maximal Sterile Barrier Technique was utilized including caps, mask, sterile gowns, sterile gloves, sterile drape, hand hygiene and skin antiseptic. A timeout was performed prior to the initiation of the procedure. Maximal barrier sterile technique utilized including caps, mask, sterile gowns, sterile gloves, large sterile drape, hand hygiene, and chlorhexadine skin prep. An  angled catheter was advanced over a wire under fluoroscopic guidance through the nose, down the esophagus and into the body of the stomach. The stomach was then insufflated with several 100 ml of air. Fluoroscopy confirmed location of the gastric bubble, as well as inferior displacement of the barium stained colon. Under direct fluoroscopic guidance, two T-tacks were placed, and the anterior gastric wall drawn up against the anterior abdominal wall. Percutaneous access was then obtained into the mid gastric body with an 18 gauge trocar needle. Aspiration of air, and injection of contrast material under fluoroscopy confirmed needle placement. An Amplatz wire was advanced in the gastric body and the access needle was removed. A 10 x 100 mm [employer] balloon was then advanced coaxially through a 18 French balloon retention percutaneous gastrostomy tube. The balloon/gastrostomy tube assembly was then advanced over the wire. The balloon was advanced through the anterior abdominal wall along the percutaneous tract. The balloon was then inflated to full effacement dilating the tract. As the  balloon was deflated, the assembly was advanced over the wire and into the stomach carrying the gastrostomy tube into the stomach. The retention balloon was filled with 10 mL saline. Contrast was injected through the tube confirming its location within the stomach. The tube was then secured with the external bumper and capped. The patient will be observed for several hours with the newly placed tube on low wall suction to evaluate for any post procedure complication. The patient tolerated the procedure well, there is no immediate complication. IMPRESSION: Successful placement of an 77 French balloon retention gastrostomy tube. Electronically Signed   By: Wilkie Lent M.D.   On: 07/05/2024 14:29   DG Chest 1 View Result Date: 07/03/2024 EXAM: 1 VIEW(S) XRAY OF THE CHEST 07/03/2024 05:10:42 AM COMPARISON: PA and lateral radiographs  of the chest dated 10/31/2012. CLINICAL HISTORY: worsening weakness FINDINGS: LINES, TUBES AND DEVICES: Monitor wires noted. LUNGS AND PLEURA: No focal pulmonary opacity. No pulmonary edema. No pleural effusion. No pneumothorax. HEART AND MEDIASTINUM: No acute abnormality of the cardiac and mediastinal silhouettes. BONES AND SOFT TISSUES: No acute osseous abnormality. IMPRESSION: 1. No acute process. Electronically signed by: Evalene Coho MD 07/03/2024 05:20 AM EST RP Workstation: [id number]   CT ABDOMEN PELVIS W CONTRAST Result Date: 06/18/2024 EXAM: CT ABDOMEN AND PELVIS WITH CONTRAST 06/18/2024 12:25:32 PM TECHNIQUE: CT of the abdomen and pelvis was performed with the administration of 100 mL of iohexol  (OMNIPAQUE ) 300 MG/ML solution. Multiplanar reformatted images are provided for review. Automated exposure control, iterative reconstruction, and/or weight-based adjustment of the mA/kV was utilized to reduce the radiation dose to as low as reasonably achievable. COMPARISON: PET CT fusion study dated 05/26/2024 and CT of the abdomen and pelvis dated 04/05/2022. CLINICAL HISTORY: left lower back/abdominal pain. Pt comes with c/o back pain and some lower belly pain. Pt states she had 1st round of chemo on Wed and radiation everyday except the weekend. Pt states since that her back has been hurting. Pt states she thinks they didn't flush her out good enough. Pt states she only got two bags of fluids and it was suppose to be 4. FINDINGS: LOWER CHEST: No acute abnormality. LIVER: The liver is unremarkable. GALLBLADDER AND BILE DUCTS: Gallbladder is unremarkable. No biliary ductal dilatation. SPLEEN: No acute abnormality. PANCREAS: No acute abnormality. ADRENAL GLANDS: No acute abnormality. KIDNEYS, URETERS AND BLADDER: No stones in the kidneys or ureters. No hydronephrosis. No perinephric or periureteral stranding. Urinary bladder is unremarkable. GI AND BOWEL: There are few scattered colonic diverticula.  Stomach demonstrates no acute abnormality. There is no bowel obstruction. PERITONEUM AND RETROPERITONEUM: No ascites. No free air. VASCULATURE: Aorta is normal in caliber. LYMPH NODES: No lymphadenopathy. REPRODUCTIVE ORGANS: No acute abnormality. BONES AND SOFT TISSUES: No acute osseous abnormality. No focal soft tissue abnormality. IMPRESSION: 1. No acute findings in the abdomen or pelvis. 2. Scattered colonic diverticula without evidence of diverticulitis. Electronically signed by: Evalene Coho MD 06/18/2024 12:55 PM EDT RP Workstation: [id number]    PERFORMANCE STATUS (ECOG) : 1 - Symptomatic but completely ambulatory  Review of Systems Unless otherwise noted, a complete review of systems is negative.  Physical Exam General: NAD HEENT: White patches on tongue Cardiovascular: regular rate and rhythm Pulmonary: clear anterior/posterior fields Abdomen: soft, nontender, + bowel sounds, PEG intact with no drainage or redness or tenderness with palpation GU: no suprapubic tenderness Extremities: no edema, no joint deformities Skin: no rashes Neurological: nonfocal    IMPRESSION/PLAN: stage III EBV positive nasopharyngeal carcinoma -on concurrent chemoradiation  Nausea/vomiting -patient not consistently taking antiemetics.  She has ondansetron  ordered.  Will add prochlorperazine as needed.  Thrush -start nystatin oral rinse  Anxiety/depression -patient seems to be highly anxious in clinic.  She has been receiving lorazepam  during treatment for anticipatory nausea.  Has alprazolam  ordered to take as needed but is not taking it.  Discussed starting her on an SSRI but patient was not interested in any additional medications or treatment.  Will refer to social work.  Reportedly, has limited social support.  Hypokalemia/hyponatremia -patient not taking her potassium supplement.  I suspect that oral intake is poor and patient is not consistently utilizing her PEG as directed.  Will proceed  with IV fluids today and replete with IV K.  Will switch from potassium tablets to liquid to ease administration via PEG.  Case and plan discussed with Dr. Jacobo.  MD follow-up later this week   Patient expressed understanding and was in agreement with this plan. She also understands that She can call clinic at any time with any questions, concerns, or complaints.   Thank you for allowing me to participate in the care of this very pleasant patient.   Time Total: 25 minutes  Visit consisted of counseling and education dealing with the complex and emotionally intense issues of symptom management in the setting of serious illness.Greater than 50%  of this time was spent counseling and coordinating care related to the above assessment and plan.  Signed by: Fonda Mower, PhD, NP-C

## 2024-07-17 NOTE — Patient Instructions (Signed)
 Ondansetron Injection What is this medication? ONDANSETRON (on DAN se tron) prevents nausea and vomiting from chemotherapy, radiation, or surgery. It works by blocking substances in the body that may cause nausea or vomiting. It belongs to a class of medications called antiemetics. This medicine may be used for other purposes; ask your health care provider or pharmacist if you have questions. COMMON BRAND NAME(S): Zofran, Zofran in Dextrose, Zofran Solution What should I tell my care team before I take this medication? They need to know if you have any of these conditions: Heart disease History of irregular heartbeat Liver disease Low levels of magnesium or potassium in the blood An unusual or allergic reaction to ondansetron, granisetron, other medications, foods, dyes, or preservatives Pregnant or trying to get pregnant Breast-feeding How should I use this medication? This medication is injected into a vein. It is given by your care team in a hospital or clinic setting. Talk to your care team about the use of this medication in children. Special care may be needed. Overdosage: If you think you have taken too much of this medicine contact a poison control center or emergency room at once. NOTE: This medicine is only for you. Do not share this medicine with others. What if I miss a dose? This does not apply. What may interact with this medication? Do not take this medication with any of the following: Apomorphine Certain medications for fungal infections, such as fluconazole, itraconazole, ketoconazole, posaconazole, voriconazole Cisapride Dronedarone Pimozide Thioridazine This medication may also interact with the following: Carbamazepine Certain medications for depression, anxiety, or mental health conditions Fentanyl Linezolid MAOIs, such as Carbex, Eldepryl, Marplan, Nardil, and Parnate Methylene blue (injected into a vein) Other medications that cause heart rhythm changes,  such as dofetilide, ziprasidone Phenytoin Rifampicin Tramadol This list may not describe all possible interactions. Give your health care provider a list of all the medicines, herbs, non-prescription drugs, or dietary supplements you use. Also tell them if you smoke, drink alcohol, or use illegal drugs. Some items may interact with your medicine. What should I watch for while using this medication? Your condition will be monitored carefully while you are receiving this medication. What side effects may I notice from receiving this medication? Side effects that you should report to your care team as soon as possible: Allergic reactions--skin rash, itching, hives, swelling of the face, lips, tongue, or throat Bowel blockage--stomach cramping, unable to have a bowel movement or pass gas, loss of appetite, vomiting Chest pain (angina)--pain, pressure, or tightness in the chest, neck, back, or arms Heart rhythm changes--fast or irregular heartbeat, dizziness, feeling faint or lightheaded, chest pain, trouble breathing Irritability, confusion, fast or irregular heartbeat, muscle stiffness, twitching muscles, sweating, high fever, seizure, chills, vomiting, diarrhea, which may be signs of serotonin syndrome Side effects that usually do not require medical attention (report to your care team if they continue or are bothersome): Constipation Diarrhea General discomfort and fatigue Headache This list may not describe all possible side effects. Call your doctor for medical advice about side effects. You may report side effects to FDA at 1-800-FDA-1088. Where should I keep my medication? This medication is given in a hospital or clinic and will not be stored at home. NOTE: This sheet is a summary. It may not cover all possible information. If you have questions about this medicine, talk to your doctor, pharmacist, or health care provider.  2024 Elsevier/Gold Standard (2022-01-15 00:00:00)

## 2024-07-17 NOTE — Telephone Encounter (Signed)
 Contacted patient. Patient c/o weakness, nausea. Requesting IV fluids. Per [employer]. Ok to add lab/ smc/ possible iv fluids.  Pt can come at 115 today after her radiation treatment.

## 2024-07-18 ENCOUNTER — Emergency Department
Admission: EM | Admit: 2024-07-18 | Discharge: 2024-07-19 | Disposition: A | Discharge status: Home / Self Care | Attending: Emergency Medicine | Admitting: Emergency Medicine

## 2024-07-18 ENCOUNTER — Other Ambulatory Visit: Payer: Self-pay

## 2024-07-18 ENCOUNTER — Encounter: Payer: Self-pay | Admitting: Emergency Medicine

## 2024-07-18 ENCOUNTER — Ambulatory Visit
Admission: RE | Admit: 2024-07-18 | Discharge: 2024-07-18 | Disposition: A | Discharge status: Home / Self Care | Source: Ambulatory Visit | Attending: Radiation Oncology | Admitting: Radiation Oncology

## 2024-07-18 DIAGNOSIS — R112 Nausea with vomiting, unspecified: Secondary | ICD-10-CM | POA: Insufficient documentation

## 2024-07-18 DIAGNOSIS — C760 Malignant neoplasm of head, face and neck: Secondary | ICD-10-CM | POA: Insufficient documentation

## 2024-07-18 LAB — RAD ONC ARIA SESSION SUMMARY
Course Elapsed Days: 35
Plan Fractions Treated to Date: 20
Plan Prescribed Dose Per Fraction: 2 Gy
Plan Total Fractions Prescribed: 35
Plan Total Prescribed Dose: 70 Gy
Reference Point Dosage Given to Date: 40 Gy
Reference Point Session Dosage Given: 2 Gy
Session Number: 20

## 2024-07-18 LAB — COMPREHENSIVE METABOLIC PANEL WITH GFR
ALT: 50 U/L — ABNORMAL HIGH (ref 0–44)
AST: 23 U/L (ref 15–41)
Albumin: 4 g/dL (ref 3.5–5.0)
Alkaline Phosphatase: 72 U/L (ref 38–126)
Anion gap: 16 — ABNORMAL HIGH (ref 5–15)
BUN: 9 mg/dL (ref 6–20)
CO2: 23 mmol/L (ref 22–32)
Calcium: 10 mg/dL (ref 8.9–10.3)
Chloride: 100 mmol/L (ref 98–111)
Creatinine, Ser: 0.76 mg/dL (ref 0.44–1.00)
GFR, Estimated: >60 mL/min (ref >60)
Glucose, Bld: 96 mg/dL (ref 70–99)
Potassium: 3.9 mmol/L (ref 3.5–5.1)
Sodium: 139 mmol/L (ref 135–145)
Total Bilirubin: 0.4 mg/dL (ref 0.0–1.2)
Total Protein: 7 g/dL (ref 6.5–8.1)

## 2024-07-18 LAB — CBC
HCT: 33.9 % — ABNORMAL LOW (ref 36.0–46.0)
Hemoglobin: 10.9 g/dL — ABNORMAL LOW (ref 12.0–15.0)
MCH: 30.9 pg (ref 26.0–34.0)
MCHC: 32.2 g/dL (ref 30.0–36.0)
MCV: 96 fL (ref 80.0–100.0)
Platelets: 222 K/uL (ref 150–400)
RBC: 3.53 MIL/uL — ABNORMAL LOW (ref 3.87–5.11)
RDW: 16.8 % — ABNORMAL HIGH (ref 11.5–15.5)
WBC: 3 K/uL — ABNORMAL LOW (ref 4.0–10.5)
nRBC: 0 % (ref 0.0–0.2)

## 2024-07-18 LAB — LIPASE, BLOOD: Lipase: 19 U/L (ref 11–51)

## 2024-07-18 MED ORDER — ONDANSETRON HCL 4 MG/2ML IJ SOLN
4.0000 mg | Freq: Once | INTRAMUSCULAR | Status: AC | PRN
  Administered 2024-07-18: 4 mg via INTRAVENOUS
  Filled 2024-07-18: qty 2

## 2024-07-18 MED ORDER — METOCLOPRAMIDE HCL 5 MG/ML IJ SOLN
10.0000 mg | Freq: Once | INTRAMUSCULAR | Status: AC
  Administered 2024-07-18: 10 mg via INTRAVENOUS
  Filled 2024-07-18: qty 2

## 2024-07-18 MED ORDER — ONDANSETRON 4 MG PO TBDP
4.0000 mg | ORAL_TABLET | Freq: Once | ORAL | Status: DC | PRN
  Filled 2024-07-18: qty 1

## 2024-07-18 MED ORDER — SODIUM CHLORIDE 0.9 % IV BOLUS
1000.0000 mL | Freq: Once | INTRAVENOUS | Status: AC
Start: 2024-07-18 — End: 2024-07-18
  Administered 2024-07-18: 1000 mL via INTRAVENOUS

## 2024-07-18 MED ORDER — HYDROMORPHONE HCL 1 MG/ML IJ SOLN
1.0000 mg | Freq: Once | INTRAMUSCULAR | Status: AC
  Administered 2024-07-18: 1 mg via INTRAVENOUS

## 2024-07-18 MED ORDER — HYDROMORPHONE HCL 1 MG/ML IJ SOLN
INTRAMUSCULAR | Status: AC
  Filled 2024-07-18: qty 1

## 2024-07-18 MED ORDER — SODIUM CHLORIDE 0.9 % IV SOLN
12.5000 mg | Freq: Once | INTRAVENOUS | Status: AC
  Administered 2024-07-18: 12.5 mg via INTRAVENOUS
  Filled 2024-07-18: qty 12.5

## 2024-07-18 NOTE — ED Notes (Signed)
 Patient was able to consume a small amount of ginger ale, but she is still complaining of nausea. MD made aware.

## 2024-07-18 NOTE — ED Provider Notes (Signed)
 [facility] Provider Note    Event Date/Time   First MD Initiated Contact with Patient 07/18/24 2157     (approximate)  History   Chief Complaint: Emesis  HPI  Brandi [name] is a 28 y.o. 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  According to the patient and record review approximately 10 days ago patient had a G-tube placed which she has been using for supplemental feeding.  Patient is undergoing 5 days a week of radiation therapy and chemotherapy every Thursday.  Patient states over the last 3 days she has had significant amounts of nausea with frequent vomiting states even when she uses the G-tube for feedings she is still vomiting.  Patient states she is having some pain at the insertion site of her G-tube which she relates to all the nausea and vomiting as well.  Patient takes hydrocodone  at home for pain but it was not helping.  Takes nausea medication at home but continued to vomit so she came to the emergency department.  Patient denies any fever.  No urinary symptoms.  Has been experiencing some loose stool.  Physical Exam   Triage Vital Signs: ED Triage Vitals  Encounter Vitals Group     BP 07/18/24 2025 (!) 153/110     Girls Systolic BP Percentile --      Girls Diastolic BP Percentile --      Boys Systolic BP Percentile --      Boys Diastolic BP Percentile --      Pulse Rate 07/18/24 2025 (!) 112     Resp 07/18/24 2025 18     Temp --      Temp src --      SpO2 07/18/24 2025 100 %     Weight --      Height --      Head Circumference --      Peak Flow --      Pain Score 07/18/24 2027 10     Pain Loc --      Pain Education --      Exclude from Growth Chart --     Most recent vital signs: Vitals:   07/18/24 2025 07/18/24 2250  BP: (!) 153/110 (!) 140/89  Pulse: (!) 112   Resp: 18 20  SpO2: 100%     General: Awake, no distress.   CV:  Good peripheral perfusion.  Regular rate and rhythm  Resp:  Normal effort.  Equal breath sounds bilaterally.  Abd:  No distention.  Soft, mild tenderness around the G-tube insertion site but no erythema no sign of infection.   ED Results / Procedures / Treatments    MEDICATIONS ORDERED IN ED: Medications  HYDROmorphone (DILAUDID) 1 MG/ML injection (has no administration in time range)  ondansetron  (ZOFRAN ) injection 4 mg (4 mg Intravenous Given 07/18/24 2051)  HYDROmorphone (DILAUDID) injection 1 mg (1 mg Intravenous Given 07/18/24 2243)  promethazine  (PHENERGAN ) 12.5 mg in sodium chloride  0.9 % 50 mL IVPB (0 mg Intravenous Stopped 07/18/24 2307)  sodium chloride  0.9 % bolus 1,000 mL (1,000 mLs Intravenous New Bag/Given 07/18/24 2239)    IMPRESSION / MDM / ASSESSMENT AND PLAN / ED COURSE  I reviewed the triage vital signs and the nursing notes.  Patient's presentation is most consistent with acute presentation with potential threat to life or bodily function.  Patient presents emergency department for nausea vomiting and pain.  Patient is on  chemotherapy every Thursday and Monday through Friday receives radiation treatments.  Patient states the chemotherapy will make her nauseated but this time seems to be worse when she is having frequent episodes of vomiting has not been able to keep down much fluids so the patient came to the emergency department.  Patient is complaining of some pain around her G-tube insertion site she has some mild tenderness to this area but not significant.  Patient's workup tonight shows a reassuring CBC.  Chemistry shows slight anion gap elevation.  LFTs and lipase show no significant finding.  Will attempt to obtain a urine sample.  Will IV hydrate will treat pain and nausea and continue to closely monitor.  Patient care signed out to oncoming provider.  FINAL CLINICAL IMPRESSION(S) / ED DIAGNOSES   Nausea vomiting    Note:  This document was prepared  using Dragon voice recognition software and may include unintentional dictation errors.   Dorothyann Drivers, MD 07/18/24 2316

## 2024-07-18 NOTE — ED Triage Notes (Signed)
 Pt arrives POV c/o n/v since yesterday; worse after chemo treatment today. Pt has chemo every Thursday. Pt had feeding tube placed recently and has been intermittent nausea until today, states she has been constantly vomiting. Pt reports pain at tube site from vomiting.

## 2024-07-19 ENCOUNTER — Ambulatory Visit: Admitting: Oncology

## 2024-07-19 ENCOUNTER — Other Ambulatory Visit: Payer: Self-pay | Admitting: *Deleted

## 2024-07-19 ENCOUNTER — Ambulatory Visit
Admission: RE | Admit: 2024-07-19 | Discharge: 2024-07-19 | Disposition: A | Source: Ambulatory Visit | Attending: Radiation Oncology | Admitting: Radiation Oncology

## 2024-07-19 ENCOUNTER — Ambulatory Visit

## 2024-07-19 ENCOUNTER — Encounter: Payer: Self-pay | Admitting: Oncology

## 2024-07-19 ENCOUNTER — Other Ambulatory Visit: Payer: Self-pay

## 2024-07-19 ENCOUNTER — Other Ambulatory Visit

## 2024-07-19 ENCOUNTER — Inpatient Hospital Stay (HOSPITAL_BASED_OUTPATIENT_CLINIC_OR_DEPARTMENT_OTHER): Admitting: Hospice and Palliative Medicine

## 2024-07-19 DIAGNOSIS — R110 Nausea: Secondary | ICD-10-CM | POA: Diagnosis not present

## 2024-07-19 DIAGNOSIS — C119 Malignant neoplasm of nasopharynx, unspecified: Secondary | ICD-10-CM

## 2024-07-19 LAB — URINALYSIS, ROUTINE W REFLEX MICROSCOPIC
Bilirubin Urine: NEGATIVE
Glucose, UA: NEGATIVE mg/dL
Hgb urine dipstick: NEGATIVE
Ketones, ur: NEGATIVE mg/dL
Leukocytes,Ua: NEGATIVE
Nitrite: NEGATIVE
Protein, ur: >=300 mg/dL — AB
Specific Gravity, Urine: 1.025 (ref 1.005–1.030)
pH: 8 (ref 5.0–8.0)

## 2024-07-19 LAB — RAD ONC ARIA SESSION SUMMARY
Course Elapsed Days: 36
Plan Fractions Treated to Date: 1
Plan Prescribed Dose Per Fraction: 2 Gy
Plan Total Fractions Prescribed: 15
Plan Total Prescribed Dose: 30 Gy
Reference Point Dosage Given to Date: 42 Gy
Reference Point Session Dosage Given: 2 Gy
Session Number: 21

## 2024-07-19 LAB — POC URINE PREG, ED: Preg Test, Ur: NEGATIVE

## 2024-07-19 MED ORDER — FLUCONAZOLE 200 MG PO TABS: 200.0000 mg | ORAL_TABLET | Freq: Every day | ORAL | 0 refills | Status: DC

## 2024-07-19 NOTE — ED Provider Notes (Signed)
  Physical Exam  BP (!) 140/89 (BP Location: Left Arm)   Pulse (!) 112   Resp 20   SpO2 100%   Physical Exam  Procedures  Procedures  ED Course / MDM   Clinical Course as of 07/19/24 0327  Tue Jul 18, 2024  2320 S/o from Dr. Dorothyann - 54F w/ pharyngeal cancer on chemo + rads - recurrent visits for nausea/vomiting - now has gtube - since chemo on Thursday, worsened nausea, vomiting after gtube feeds - no indication for imaging on initial eval  TO DO: - re-eval, if sx controlled, stable for outpt f/u [MM]  Wed Jul 19, 2024  0052 Patient reevaluated, repeat abdominal exam benign.  Had a small sip of ginger ale though has not trialed much p.o. --will attempt to drink more and I will reevaluate her shortly.  If tolerating p.o., can consider discharge home.  If not tolerating, consider admission.  Agree with prior provider with no concern for acute intra-abdominal pathology at this time on my exam.  Have not yet collected urinalysis, will attempt to get sample shortly. [MM]  0106 Patient attempting to give urine sample now [MM]  0153 Hcg neg [MM]  0155 UA overall not consistent with infection, does appear contaminated  Patient denies any urinary symptoms, will defer treatment [MM]  0325 Patient reevaluated, resting comfortably in bed.  Tolerating p.o.  No further vomiting.  No ongoing pain.  Reiterates no urinary symptoms. She is feeling better and amenable to discharge home.  Recommend she follow-up with her PMD and/or her oncologist.  Abdominal exam benign, not clinically concern for acute underlying pathology at this time.  Mild tachycardia at triage has resolved.  ED return precautions in place.  Will continue with her home antiemetic regimen.  Patient agrees with plan. [MM]    Clinical Course User Index [MM] Clarine Ozell LABOR, MD   Medical Decision Making Amount and/or Complexity of Data Reviewed Labs: ordered.  Risk Prescription drug management.          Clarine Ozell LABOR, MD 07/19/24 [phone]

## 2024-07-19 NOTE — ED Notes (Signed)
 PT in no acute distress prior to discharge. Discharged instructions reviewed, all questions answered and pt verbalized understanding at this time. Pt has all belongings with them at time of discharge.

## 2024-07-19 NOTE — Progress Notes (Signed)
 Symptom Management Clinic [facility] at [facility] [phone] [phone]  Patient Care Team: [name], MD as PCP - General (Internal Medicine) [name], MD as Consulting Physician (Radiation Oncology) [name], MD as Consulting Physician (Oncology)   NAME OF PATIENT: Brandi [name]  [id number]  May 06, 1996   DATE OF VISIT: 07/19/24  REASON FOR CONSULT: Brandi [name] is a 28 y.o. 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  INTERVAL HISTORY: Patient presented to the emergency department overnight with complaint of nausea and vomiting.  She had a benign abdominal exam and symptoms improved with supportive care.  Patient discharged home.  She presents to the cancer center this morning for XRT.  Was an add-on at patient's request to discuss antiemetic regimen.  Denies any neurologic complaints. Denies recent fevers or illnesses. Denies any easy bleeding or bruising.  Denies chest pain. Denies any constipation, or diarrhea. Denies urinary complaints. Patient offers no further specific complaints today.   PAST MEDICAL HISTORY: Past Medical History:  Diagnosis Date   Asthma    Cancer (HCC)    GERD (gastroesophageal reflux disease)    HTN (hypertension)     PAST SURGICAL HISTORY:  Past Surgical History:  Procedure Laterality Date   dental procedure     IR GASTROSTOMY TUBE MOD SED  07/05/2024   IR US  LIVER BIOPSY  05/17/2024    HEMATOLOGY/ONCOLOGY HISTORY:  Oncology History  Nasopharyngeal carcinoma (HCC)  05/19/2024 Initial Diagnosis   Nasopharyngeal carcinoma (HCC)   05/30/2024 Cancer Staging   Staging form: Pharynx - Nasopharynx, AJCC V9 - Clinical stage from 05/30/2024: Stage III (cTX, cN3, cM0) - Signed by [name], MD on 05/30/2024 Stage prefix: Initial diagnosis Method of lymph node assessment: Clinical   06/14/2024 -   Chemotherapy   Patient is on Treatment Plan : HEAD/NECK Cisplatin  (40) q7d       ALLERGIES:  is allergic to shellfish allergy and tomato.  MEDICATIONS:  Current Outpatient Medications  Medication Sig Dispense Refill   ALPRAZolam  (XANAX ) 0.25 MG tablet Take 1 tablet (0.25 mg total) by mouth at bedtime as needed for anxiety. 30 tablet 0   amLODipine  (NORVASC ) 5 MG tablet Place 1 tablet (5 mg total) into feeding tube daily. 30 tablet 0   cyclobenzaprine  (FLEXERIL ) 10 MG tablet Take 1 tablet (10 mg total) by mouth 3 (three) times daily as needed for muscle spasms. 90 tablet 0   fluconazole  (DIFLUCAN ) 200 MG tablet Take 1 tablet (200 mg total) by mouth daily. 7 tablet 0   lactulose  (CHRONULAC ) 10 GM/15ML solution Take 45 mLs (30 g total) by mouth daily as needed for severe constipation. 473 mL 0   lidocaine  (XYLOCAINE ) 2 % solution Use as directed 15 mLs in the mouth or throat every 3 (three) hours as needed for mouth pain. swish and swallow. 100 mL 1   Multiple Vitamin (MULTIVITAMIN WITH MINERALS) TABS tablet Place 1 tablet into feeding tube daily. 30 tablet 0   Nutritional Supplements (ENSURE CLEAR) LIQD Take 1 Bottle by mouth 2 (two) times daily. 296 mL 6   Nutritional Supplements (FEEDING SUPPLEMENT, OSMOLITE 1.5 CAL,) LIQD Place 119 mLs into feeding tube 6 (six) times daily.     nystatin  (MYCOSTATIN ) 100000 UNIT/ML suspension Take 5 mLs (500,000 Units total) by mouth 4 (four) times daily. 60 mL 0   ondansetron  (ZOFRAN ) 8 MG tablet Place 1 tablet (8 mg total) into feeding tube every  8 (eight) hours as needed for nausea or vomiting. Start on the third day after cisplatin . 20 tablet 0   pantoprazole  (PROTONIX ) 40 MG tablet Take 1 tablet (40 mg total) by mouth daily. 30 tablet 11   phenol (CHLORASEPTIC) 1.4 % LIQD Use as directed 1 spray in the mouth or throat as needed for throat irritation / pain.     potassium chloride  20 MEQ/15ML (10%) SOLN Take 15 mLs (20 mEq total) by mouth daily. 473 mL 0    prochlorperazine  (COMPAZINE ) 10 MG tablet Take 1 tablet (10 mg total) by mouth every 8 (eight) hours as needed for nausea or vomiting. 30 tablet 0   senna-docusate (SENOKOT-S) 8.6-50 MG tablet Take 1 tablet by mouth 2 (two) times daily. 15 tablet 0   SLYND  4 MG TABS Take 1 tablet by mouth daily.     thiamine  (VITAMIN B-1) 100 MG tablet Place 1 tablet (100 mg total) into feeding tube daily. 7 tablet 0   Water  For Irrigation, Sterile (FREE WATER ) SOLN Place 60 mLs into feeding tube 6 (six) times daily.     No current facility-administered medications for this visit.   Facility-Administered Medications Ordered in Other Visits  Medication Dose Route Frequency Provider Last Rate Last Admin   0.9 %  sodium chloride  infusion   Intravenous Continuous [name], MD 10 mL/hr at 07/13/24 1004 New Bag at 07/13/24 1004    VITAL SIGNS: There were no vitals taken for this visit. There were no vitals filed for this visit.   Estimated body mass index is 36.23 kg/m as calculated from the following:   Height as of 07/17/24: 5' 2 (1.575 m).   Weight as of 07/17/24: 198 lb 1.6 oz (89.9 kg).  LABS: CBC:    Component Value Date/Time   WBC 3.0 (L) 07/18/2024 2034   HGB 10.9 (L) 07/18/2024 2034   HGB 10.2 (L) 07/17/2024 1328   HCT 33.9 (L) 07/18/2024 2034   PLT 222 07/18/2024 2034   PLT 282 07/17/2024 1328   MCV 96.0 07/18/2024 2034   NEUTROABS 1.9 07/17/2024 1328   LYMPHSABS 0.2 (L) 07/17/2024 1328   MONOABS 0.2 07/17/2024 1328   EOSABS 0.0 07/17/2024 1328   BASOSABS 0.0 07/17/2024 1328   Comprehensive Metabolic Panel:    Component Value Date/Time   NA 139 07/18/2024 2034   K 3.9 07/18/2024 2034   CL 100 07/18/2024 2034   CO2 23 07/18/2024 2034   BUN 9 07/18/2024 2034   CREATININE 0.76 07/18/2024 2034   CREATININE 0.79 07/17/2024 1328   GLUCOSE 96 07/18/2024 2034   CALCIUM 10.0 07/18/2024 2034   AST 23 07/18/2024 2034   AST 29 07/17/2024 1328   ALT 50 (H) 07/18/2024 2034    ALT 49 (H) 07/17/2024 1328   ALKPHOS 72 07/18/2024 2034   BILITOT 0.4 07/18/2024 2034   BILITOT 0.5 07/17/2024 1328   PROT 7.0 07/18/2024 2034   ALBUMIN 4.0 07/18/2024 2034    RADIOGRAPHIC STUDIES: CT ABDOMEN PELVIS W CONTRAST Result Date: 07/07/2024 CLINICAL DATA:  Abdominal pain status post gastrostomy placement EXAM: CT ABDOMEN AND PELVIS WITH CONTRAST TECHNIQUE: Multidetector CT imaging of the abdomen and pelvis was performed using the standard protocol following bolus administration of intravenous contrast. RADIATION DOSE REDUCTION: This exam was performed according to the departmental dose-optimization program which includes automated exposure control, adjustment of the mA and/or kV according to patient size and/or use of iterative reconstruction technique. CONTRAST:  OMNIPAQUE  IOHEXOL  300 MG/ML  SOLN COMPARISON:  CT abdomen and pelvis dated 06/18/2024 FINDINGS: Lower chest: No focal consolidation or pulmonary nodule in the lung bases. No pleural effusion or pneumothorax demonstrated. Partially imaged heart size is normal. Hepatobiliary: No focal hepatic lesions. No intra or extrahepatic biliary ductal dilation. Normal gallbladder. Pancreas: No focal lesions or main ductal dilation. Spleen: Normal in size without focal abnormality. Adrenals/Urinary Tract: No adrenal nodules. No suspicious renal mass, calculi or hydronephrosis. No focal bladder wall thickening. Stomach/Bowel: Percutaneous gastrostomy tube in-situ. T tacks remain, tenting the anterior gastric wall. No evidence of bowel wall thickening, distention, or inflammatory changes. Normal appendix. Vascular/Lymphatic: No significant vascular findings are present. No enlarged abdominal or pelvic lymph nodes. Reproductive: No adnexal masses. Other: Punctate foci of free air along the anterior midline peritoneum. No free fluid or fluid collection. Musculoskeletal: No acute or abnormal lytic or blastic osseous lesions. IMPRESSION: 1.  Percutaneous gastrostomy tube in-situ. 2. Punctate foci of free air along the anterior midline peritoneum, likely related to recent gastrostomy tube placement. Electronically Signed   By: Limin  Xu M.D.   On: 07/07/2024 15:48   IR GASTROSTOMY TUBE MOD SED Result Date: 07/05/2024 INDICATION: 28 year old female with nasopharyngeal carcinoma currently on therapy but struggling with dysphagia. She presents for placement of a percutaneous gastrostomy tube for supplemental nutrition. EXAM: Fluoroscopically guided placement of percutaneous push in balloon retention gastrostomy tube Interventional Radiologist:  Wilkie LOIS Lent, MD MEDICATIONS: 1 mg glucagon and 4 mg Zofran  administered intravenously by the Radiology nurse ANESTHESIA/SEDATION: Versed  5 mg IV; Fentanyl  100 mcg IV and 1 mg Dilaudid administered intravenously by the Radiology nurse Moderate Sedation Time:  25 minutes The patient's vital signs and level of consciousness were continuously monitored during the procedure by the interventional radiology nurse under my direct supervision. CONTRAST:  10mL OMNIPAQUE  IOHEXOL  300 MG/ML SOLN FLUOROSCOPY: Radiation exposure index: 26.3 mGy, air kerma COMPLICATIONS: None immediate. PROCEDURE: Informed written consent was obtained from the patient after a thorough discussion of the procedural risks, benefits and alternatives. All questions were addressed. Maximal Sterile Barrier Technique was utilized including caps, mask, sterile gowns, sterile gloves, sterile drape, hand hygiene and skin antiseptic. A timeout was performed prior to the initiation of the procedure. Maximal barrier sterile technique utilized including caps, mask, sterile gowns, sterile gloves, large sterile drape, hand hygiene, and chlorhexadine skin prep. An angled catheter was advanced over a wire under fluoroscopic guidance through the nose, down the esophagus and into the body of the stomach. The stomach was then insufflated with several 100 ml of  air. Fluoroscopy confirmed location of the gastric bubble, as well as inferior displacement of the barium stained colon. Under direct fluoroscopic guidance, two T-tacks were placed, and the anterior gastric wall drawn up against the anterior abdominal wall. Percutaneous access was then obtained into the mid gastric body with an 18 gauge trocar needle. Aspiration of air, and injection of contrast material under fluoroscopy confirmed needle placement. An Amplatz wire was advanced in the gastric body and the access needle was removed. A 10 x 100 mm Athletis balloon was then advanced coaxially through a 18 French balloon retention percutaneous gastrostomy tube. The balloon/gastrostomy tube assembly was then advanced over the wire. The balloon was advanced through the anterior abdominal wall along the percutaneous tract. The balloon was then inflated to full effacement dilating the tract. As the balloon was deflated, the assembly was advanced over the wire and into the stomach carrying the gastrostomy tube into the stomach. The retention balloon was filled with 10 mL saline.  Contrast was injected through the tube confirming its location within the stomach. The tube was then secured with the external bumper and capped. The patient will be observed for several hours with the newly placed tube on low wall suction to evaluate for any post procedure complication. The patient tolerated the procedure well, there is no immediate complication. IMPRESSION: Successful placement of an 54 French balloon retention gastrostomy tube. Electronically Signed   By: Wilkie Lent M.D.   On: 07/05/2024 14:29   DG Chest 1 View Result Date: 07/03/2024 EXAM: 1 VIEW(S) XRAY OF THE CHEST 07/03/2024 05:10:42 AM COMPARISON: PA and lateral radiographs of the chest dated 10/31/2012. CLINICAL HISTORY: worsening weakness FINDINGS: LINES, TUBES AND DEVICES: Monitor wires noted. LUNGS AND PLEURA: No focal pulmonary opacity. No pulmonary edema. No  pleural effusion. No pneumothorax. HEART AND MEDIASTINUM: No acute abnormality of the cardiac and mediastinal silhouettes. BONES AND SOFT TISSUES: No acute osseous abnormality. IMPRESSION: 1. No acute process. Electronically signed by: Evalene Coho MD 07/03/2024 05:20 AM EST RP Workstation: [id number]    PERFORMANCE STATUS (ECOG) : 1 - Symptomatic but completely ambulatory  Review of Systems Unless otherwise noted, a complete review of systems is negative.  Physical Exam General: NAD Pulmonary: Unlabored Extremities: no edema, no joint deformities Skin: no rashes Neurological: nonfocal  IMPRESSION/PLAN: stage III EBV positive nasopharyngeal carcinoma -on concurrent chemoradiation  Nausea/vomiting -received supportive care in the ED overnight.  Nontoxic-appearing.  Patient had questions about her antiemetic regimen which we discussed in detail today.  Patient admits that she is not consistently taking either ondansetron  or prochlorperazine.  I recommended that she trial scheduled dosing of antiemetics and we can consider rotating antiemetics if no improvement.  Likely nausea and vomiting secondary to treatment effects.  Case and plan discussed with Dr. Jacobo.  MD follow-up tomorrow.  Patient expressed understanding and was in agreement with this plan. She also understands that She can call clinic at any time with any questions, concerns, or complaints.   Thank you for allowing me to participate in the care of this very pleasant patient.   Time Total: 15 minutes  Visit consisted of counseling and education dealing with the complex and emotionally intense issues of symptom management in the setting of serious illness.Greater than 50%  of this time was spent counseling and coordinating care related to the above assessment and plan.  Signed by: Fonda Mower, PhD, NP-C

## 2024-07-19 NOTE — Discharge Instructions (Signed)
 Your evaluation in the emergency department was overall reassuring, and your symptoms improved with treatment and and IV hydration.  Please continue with your home antinausea medications and pain medications and follow-up with your primary care provider and/or oncologist for reevaluation.  Return to the emergency department with any new or worsening symptoms.

## 2024-07-20 ENCOUNTER — Inpatient Hospital Stay

## 2024-07-20 ENCOUNTER — Ambulatory Visit
Admission: RE | Admit: 2024-07-20 | Discharge: 2024-07-20 | Disposition: A | Discharge status: Home / Self Care | Source: Ambulatory Visit | Attending: Radiation Oncology | Admitting: Radiation Oncology

## 2024-07-20 ENCOUNTER — Encounter: Payer: Self-pay | Admitting: Oncology

## 2024-07-20 ENCOUNTER — Other Ambulatory Visit: Payer: Self-pay

## 2024-07-20 ENCOUNTER — Inpatient Hospital Stay (HOSPITAL_BASED_OUTPATIENT_CLINIC_OR_DEPARTMENT_OTHER): Admitting: Oncology

## 2024-07-20 VITALS — BP 138/90 | HR 100 | Temp 98.0°F | Resp 18 | Ht 62.0 in | Wt 195.0 lb

## 2024-07-20 VITALS — BP 150/98 | HR 93 | Temp 97.0°F | Resp 19

## 2024-07-20 DIAGNOSIS — C119 Malignant neoplasm of nasopharynx, unspecified: Secondary | ICD-10-CM | POA: Diagnosis not present

## 2024-07-20 LAB — RAD ONC ARIA SESSION SUMMARY
Course Elapsed Days: 37
Plan Fractions Treated to Date: 2
Plan Prescribed Dose Per Fraction: 2 Gy
Plan Total Fractions Prescribed: 15
Plan Total Prescribed Dose: 30 Gy
Reference Point Dosage Given to Date: 44 Gy
Reference Point Session Dosage Given: 2 Gy
Session Number: 22

## 2024-07-20 LAB — CBC WITH DIFFERENTIAL (CANCER CENTER ONLY)
Abs Immature Granulocytes: 0.02 K/uL (ref 0.00–0.07)
Basophils Absolute: 0 K/uL (ref 0.0–0.1)
Basophils Relative: 0 %
Eosinophils Absolute: 0 K/uL (ref 0.0–0.5)
Eosinophils Relative: 1 %
HCT: 29.5 % — ABNORMAL LOW (ref 36.0–46.0)
Hemoglobin: 9.8 g/dL — ABNORMAL LOW (ref 12.0–15.0)
Immature Granulocytes: 1 %
Lymphocytes Relative: 7 %
Lymphs Abs: 0.3 K/uL — ABNORMAL LOW (ref 0.7–4.0)
MCH: 30.8 pg (ref 26.0–34.0)
MCHC: 33.2 g/dL (ref 30.0–36.0)
MCV: 92.8 fL (ref 80.0–100.0)
Monocytes Absolute: 0.2 K/uL (ref 0.1–1.0)
Monocytes Relative: 7 %
Neutro Abs: 3.2 K/uL (ref 1.7–7.7)
Neutrophils Relative %: 84 %
Platelet Count: 363 K/uL (ref 150–400)
RBC: 3.18 MIL/uL — ABNORMAL LOW (ref 3.87–5.11)
RDW: 16.3 % — ABNORMAL HIGH (ref 11.5–15.5)
WBC Count: 3.7 K/uL — ABNORMAL LOW (ref 4.0–10.5)
nRBC: 0 % (ref 0.0–0.2)

## 2024-07-20 LAB — BASIC METABOLIC PANEL - CANCER CENTER ONLY
Anion gap: 10 (ref 5–15)
BUN: 14 mg/dL (ref 6–20)
CO2: 26 mmol/L (ref 22–32)
Calcium: 9.2 mg/dL (ref 8.9–10.3)
Chloride: 103 mmol/L (ref 98–111)
Creatinine: 0.98 mg/dL (ref 0.44–1.00)
GFR, Estimated: >60 mL/min (ref >60)
Glucose, Bld: 121 mg/dL — ABNORMAL HIGH (ref 70–99)
Potassium: 4 mmol/L (ref 3.5–5.1)
Sodium: 139 mmol/L (ref 135–145)

## 2024-07-20 LAB — MAGNESIUM: Magnesium: 2 mg/dL (ref 1.7–2.4)

## 2024-07-20 LAB — PHOSPHORUS: Phosphorus: 4 mg/dL (ref 2.5–4.6)

## 2024-07-20 MED ORDER — PALONOSETRON HCL INJECTION 0.25 MG/5ML
0.2500 mg | Freq: Once | INTRAVENOUS | Status: AC
  Administered 2024-07-20: 0.25 mg via INTRAVENOUS
  Filled 2024-07-20: qty 5

## 2024-07-20 MED ORDER — LORAZEPAM 2 MG/ML IJ SOLN
1.0000 mg | Freq: Once | INTRAMUSCULAR | Status: AC
  Administered 2024-07-20: 1 mg via INTRAVENOUS
  Filled 2024-07-20: qty 1

## 2024-07-20 MED ORDER — SODIUM CHLORIDE 0.9 % IV SOLN
40.0000 mg/m2 | Freq: Once | INTRAVENOUS | Status: AC
  Administered 2024-07-20: 84 mg via INTRAVENOUS
  Filled 2024-07-20: qty 84

## 2024-07-20 MED ORDER — SODIUM CHLORIDE 0.9 % IV SOLN
INTRAVENOUS | Status: DC
  Filled 2024-07-20: qty 250

## 2024-07-20 MED ORDER — POTASSIUM CHLORIDE IN NACL 20-0.9 MEQ/L-% IV SOLN
Freq: Once | INTRAVENOUS | Status: AC
  Filled 2024-07-20: qty 1000

## 2024-07-20 MED ORDER — APREPITANT 130 MG/18ML IV EMUL
130.0000 mg | Freq: Once | INTRAVENOUS | Status: AC
  Administered 2024-07-20: 130 mg via INTRAVENOUS
  Filled 2024-07-20: qty 18

## 2024-07-20 MED ORDER — DEXAMETHASONE SOD PHOSPHATE PF 10 MG/ML IJ SOLN
10.0000 mg | Freq: Once | INTRAMUSCULAR | Status: AC
  Administered 2024-07-20: 10 mg via INTRAVENOUS

## 2024-07-20 MED ORDER — MAGNESIUM SULFATE 2 GM/50ML IV SOLN
2.0000 g | Freq: Once | INTRAVENOUS | Status: AC
  Administered 2024-07-20: 2 g via INTRAVENOUS
  Filled 2024-07-20: qty 50

## 2024-07-20 NOTE — Progress Notes (Signed)
 Nutrition Follow-up:  Patient with stage III EBV positive nasopharyngeal carcinoma.  Patient receiving cisplatin  and radiation.  Followed by Dr Jacobo.  PEG (18 Fr) placed on 11/5  Met with patient during infusion.  Patient was sleepy.  Says that she has been able to take 3 carton of formula a day.  Takes most of the carton at one time.  Can't tell RD when she is giving tube feeding.  I give it when I feel hungry.  Drinking liquids orally (gingerale, gatorade).  Reports soft bowel movement this am.  Noted visit on 11/19 to ED with abdominal xray being benign.    Noted seen yesterday by [facility] [place], NP to discuss antiemetic regimen.  Noted not taking medication consistently.     Medications: zofran , compazine , lactulose , KCL  Labs: K WNL, phosphorus WNL, Mag WNL  Anthropometrics:   Weight 195 lb  197 lb on 11/13 207 lb on 11/9 204 lb 2 oz on 10/29 210 lb on 10/22 219 lb on 10/19 216 lb on 8/21 235 lb on 02/29/24   Estimated Energy Needs  Kcals: 2000-2300 Protein: 95-114 g Fluid: > 2 L  NUTRITION DIAGNOSIS: Inadequate oral intake continues  Malnutrition Diagnosis: Severe   INTERVENTION:  Encouraged taking antinausea medication Recommend 1 full carton of osmolite 1.5 4 times a day (8am, noon, 4pm and 8pm) with 60ml water  flush before and after each feeding.  Written instructions given to patient. Patient to continue to drink liquids orally for additional hydration.      MONITORING, EVALUATION, GOAL: weight trends, tube feeding   NEXT VISIT: Wed, Nov 26 during infusion  Laylani Pudwill B. Dasie SOLON, CSO, LDN Registered Dietitian [phone]

## 2024-07-20 NOTE — Progress Notes (Signed)
 [facility]  [phone] [phone]  ID: Brandi [name] OB: [birth year]  MR#: [id number]  RDW#:[id number]  Patient Care Team: [name], MD as PCP - General (Internal Medicine) [name], MD as Consulting Physician (Radiation Oncology) [name], MD as Consulting Physician (Oncology)  CHIEF COMPLAINT: Stage III EBV positive nasopharyngeal carcinoma.  INTERVAL HISTORY: Patient returns to clinic today for repeat laboratory work, further evaluation, and consideration of cycle 5 of weekly cisplatin .  She continues daily XRT as well.  She continues to have significant anxiety.  She also has persistent nausea and vomiting, but admits to not taking her antiemetics as directed.  She has dysphagia and reports nearly 100% of her nutrition comes by her PEG tube. She has no neurologic complaints.  She denies any recent fevers.  She has no chest pain, shortness of breath, cough, or hemoptysis.  She denies any vomiting or diarrhea.  She has no urinary complaints.  Patient feels generally terrible, but offers no further specific complaints today.  REVIEW OF SYSTEMS:   Review of Systems  Constitutional:  Positive for malaise/fatigue. Negative for chills and fever.  HENT:  Negative for sore throat.   Respiratory: Negative.  Negative for cough, hemoptysis and shortness of breath.   Cardiovascular: Negative.  Negative for chest pain and leg swelling.  Gastrointestinal:  Positive for nausea and vomiting. Negative for abdominal pain and constipation.  Genitourinary: Negative.  Negative for dysuria.  Musculoskeletal: Negative.  Negative for back pain.  Skin: Negative.  Negative for rash.  Neurological: Negative.  Negative for dizziness, focal weakness, weakness and headaches.  Psychiatric/Behavioral:  The patient is nervous/anxious.     As per HPI. Otherwise, a complete review of systems is negative.  PAST MEDICAL HISTORY: Past Medical History:   Diagnosis Date   Asthma    Cancer (HCC)    GERD (gastroesophageal reflux disease)    HTN (hypertension)     PAST SURGICAL HISTORY: Past Surgical History:  Procedure Laterality Date   dental procedure     IR GASTROSTOMY TUBE MOD SED  07/05/2024   IR US  LIVER BIOPSY  05/17/2024    FAMILY HISTORY: Family History  Problem Relation Age of Onset   Hypertension Maternal Grandmother    Diabetes Maternal Grandmother     ADVANCED DIRECTIVES (Y/N):  N  HEALTH MAINTENANCE: Social History   Tobacco Use   Smoking status: Former    Types: E-cigarettes   Smokeless tobacco: Never  Vaping Use   Vaping status: Former  Substance Use Topics   Alcohol use: Not Currently    Comment: social   Drug use: Yes    Types: Marijuana     Colonoscopy:  PAP:  Bone density:  Lipid panel:  Allergies  Allergen Reactions   Shellfish Allergy Anaphylaxis   Tomato Other (See Comments)    Flares up exema    Current Outpatient Medications  Medication Sig Dispense Refill   ALPRAZolam  (XANAX ) 0.25 MG tablet Take 1 tablet (0.25 mg total) by mouth at bedtime as needed for anxiety. 30 tablet 0   amLODipine  (NORVASC ) 5 MG tablet Place 1 tablet (5 mg total) into feeding tube daily. 30 tablet 0   cyclobenzaprine  (FLEXERIL ) 10 MG tablet Take 1 tablet (10 mg total) by mouth 3 (three) times daily as needed for muscle spasms. 90 tablet 0   fluconazole  (DIFLUCAN ) 200 MG tablet Take 1 tablet (200 mg total) by mouth daily. 7 tablet 0   lactulose  (CHRONULAC ) 10 GM/15ML solution Take 45  mLs (30 g total) by mouth daily as needed for severe constipation. 473 mL 0   lidocaine  (XYLOCAINE ) 2 % solution Use as directed 15 mLs in the mouth or throat every 3 (three) hours as needed for mouth pain. swish and swallow. 100 mL 1   Multiple Vitamin (MULTIVITAMIN WITH MINERALS) TABS tablet Place 1 tablet into feeding tube daily. 30 tablet 0   Nutritional Supplements (ENSURE CLEAR) LIQD Take 1 Bottle by mouth 2 (two) times daily.  296 mL 6   Nutritional Supplements (FEEDING SUPPLEMENT, OSMOLITE 1.5 CAL,) LIQD Place 119 mLs into feeding tube 6 (six) times daily.     nystatin  (MYCOSTATIN ) 100000 UNIT/ML suspension Take 5 mLs (500,000 Units total) by mouth 4 (four) times daily. 60 mL 0   ondansetron  (ZOFRAN ) 8 MG tablet Place 1 tablet (8 mg total) into feeding tube every 8 (eight) hours as needed for nausea or vomiting. Start on the third day after cisplatin . 20 tablet 0   pantoprazole  (PROTONIX ) 40 MG tablet Take 1 tablet (40 mg total) by mouth daily. 30 tablet 11   phenol (CHLORASEPTIC) 1.4 % LIQD Use as directed 1 spray in the mouth or throat as needed for throat irritation / pain.     potassium chloride  20 MEQ/15ML (10%) SOLN Take 15 mLs (20 mEq total) by mouth daily. 473 mL 0   prochlorperazine  (COMPAZINE ) 10 MG tablet Take 1 tablet (10 mg total) by mouth every 8 (eight) hours as needed for nausea or vomiting. 30 tablet 0   senna-docusate (SENOKOT-S) 8.6-50 MG tablet Take 1 tablet by mouth 2 (two) times daily. 15 tablet 0   SLYND  4 MG TABS Take 1 tablet by mouth daily.     thiamine  (VITAMIN B-1) 100 MG tablet Place 1 tablet (100 mg total) into feeding tube daily. 7 tablet 0   Water  For Irrigation, Sterile (FREE WATER ) SOLN Place 60 mLs into feeding tube 6 (six) times daily.     No current facility-administered medications for this visit.   Facility-Administered Medications Ordered in Other Visits  Medication Dose Route Frequency Provider Last Rate Last Admin   0.9 %  sodium chloride  infusion   Intravenous Continuous [name], MD 10 mL/hr at 07/13/24 1004 New Bag at 07/13/24 1004    OBJECTIVE: Vitals:   07/20/24 0853  BP: (!) 138/90  Pulse: 100  Resp: 18  Temp: 98 F (36.7 C)  SpO2: 100%       Body mass index is 35.67 kg/m.    ECOG FS:1 - Symptomatic but completely ambulatory  General: Well-developed, well-nourished, no acute distress. Eyes: Pink conjunctiva, anicteric sclera. HEENT:  Normocephalic, moist mucous membranes. Lungs: No audible wheezing or coughing. Heart: Regular rate and rhythm. Abdomen: Soft, nontender, no obvious distention. Musculoskeletal: No edema, cyanosis, or clubbing. Neuro: Alert, answering all questions appropriately. Cranial nerves grossly intact. Skin: No rashes or petechiae noted. Psych: Normal affect.  LAB RESULTS:  Lab Results  Component Value Date   NA 139 07/20/2024   K 4.0 07/20/2024   CL 103 07/20/2024   CO2 26 07/20/2024   GLUCOSE 121 (H) 07/20/2024   BUN 14 07/20/2024   CREATININE 0.98 07/20/2024   CALCIUM 9.2 07/20/2024   PROT 7.0 07/18/2024   ALBUMIN 4.0 07/18/2024   AST 23 07/18/2024   ALT 50 (H) 07/18/2024   ALKPHOS 72 07/18/2024   BILITOT 0.4 07/18/2024   GFRNONAA >60 07/20/2024   GFRAA >60 12/05/2019    Lab Results  Component Value Date  WBC 3.7 (L) 07/20/2024   NEUTROABS 3.2 07/20/2024   HGB 9.8 (L) 07/20/2024   HCT 29.5 (L) 07/20/2024   MCV 92.8 07/20/2024   PLT 363 07/20/2024     STUDIES: CT ABDOMEN PELVIS W CONTRAST Result Date: 07/07/2024 CLINICAL DATA:  Abdominal pain status post gastrostomy placement EXAM: CT ABDOMEN AND PELVIS WITH CONTRAST TECHNIQUE: Multidetector CT imaging of the abdomen and pelvis was performed using the standard protocol following bolus administration of intravenous contrast. RADIATION DOSE REDUCTION: This exam was performed according to the departmental dose-optimization program which includes automated exposure control, adjustment of the mA and/or kV according to patient size and/or use of iterative reconstruction technique. CONTRAST:  OMNIPAQUE  IOHEXOL  300 MG/ML  SOLN COMPARISON:  CT abdomen and pelvis dated 06/18/2024 FINDINGS: Lower chest: No focal consolidation or pulmonary nodule in the lung bases. No pleural effusion or pneumothorax demonstrated. Partially imaged heart size is normal. Hepatobiliary: No focal hepatic lesions. No intra or extrahepatic biliary ductal  dilation. Normal gallbladder. Pancreas: No focal lesions or main ductal dilation. Spleen: Normal in size without focal abnormality. Adrenals/Urinary Tract: No adrenal nodules. No suspicious renal mass, calculi or hydronephrosis. No focal bladder wall thickening. Stomach/Bowel: Percutaneous gastrostomy tube in-situ. T tacks remain, tenting the anterior gastric wall. No evidence of bowel wall thickening, distention, or inflammatory changes. Normal appendix. Vascular/Lymphatic: No significant vascular findings are present. No enlarged abdominal or pelvic lymph nodes. Reproductive: No adnexal masses. Other: Punctate foci of free air along the anterior midline peritoneum. No free fluid or fluid collection. Musculoskeletal: No acute or abnormal lytic or blastic osseous lesions. IMPRESSION: 1. Percutaneous gastrostomy tube in-situ. 2. Punctate foci of free air along the anterior midline peritoneum, likely related to recent gastrostomy tube placement. Electronically Signed   By: Limin  Xu M.D.   On: 07/07/2024 15:48   IR GASTROSTOMY TUBE MOD SED Result Date: 07/05/2024 INDICATION: 28 year old female with nasopharyngeal carcinoma currently on therapy but struggling with dysphagia. She presents for placement of a percutaneous gastrostomy tube for supplemental nutrition. EXAM: Fluoroscopically guided placement of percutaneous push in balloon retention gastrostomy tube Interventional Radiologist:  Wilkie LOIS Lent, MD MEDICATIONS: 1 mg glucagon  and 4 mg Zofran  administered intravenously by the Radiology nurse ANESTHESIA/SEDATION: Versed  5 mg IV; Fentanyl  100 mcg IV and 1 mg Dilaudid  administered intravenously by the Radiology nurse Moderate Sedation Time:  25 minutes The patient's vital signs and level of consciousness were continuously monitored during the procedure by the interventional radiology nurse under my direct supervision. CONTRAST:  10mL OMNIPAQUE  IOHEXOL  300 MG/ML SOLN FLUOROSCOPY: Radiation exposure index:  26.3 mGy, air kerma COMPLICATIONS: None immediate. PROCEDURE: Informed written consent was obtained from the patient after a thorough discussion of the procedural risks, benefits and alternatives. All questions were addressed. Maximal Sterile Barrier Technique was utilized including caps, mask, sterile gowns, sterile gloves, sterile drape, hand hygiene and skin antiseptic. A timeout was performed prior to the initiation of the procedure. Maximal barrier sterile technique utilized including caps, mask, sterile gowns, sterile gloves, large sterile drape, hand hygiene, and chlorhexadine skin prep. An angled catheter was advanced over a wire under fluoroscopic guidance through the nose, down the esophagus and into the body of the stomach. The stomach was then insufflated with several 100 ml of air. Fluoroscopy confirmed location of the gastric bubble, as well as inferior displacement of the barium stained colon. Under direct fluoroscopic guidance, two T-tacks were placed, and the anterior gastric wall drawn up against the anterior abdominal wall. Percutaneous access was then  obtained into the mid gastric body with an 18 gauge trocar needle. Aspiration of air, and injection of contrast material under fluoroscopy confirmed needle placement. An Amplatz wire was advanced in the gastric body and the access needle was removed. A 10 x 100 mm Athletis balloon was then advanced coaxially through a 18 French balloon retention percutaneous gastrostomy tube. The balloon/gastrostomy tube assembly was then advanced over the wire. The balloon was advanced through the anterior abdominal wall along the percutaneous tract. The balloon was then inflated to full effacement dilating the tract. As the balloon was deflated, the assembly was advanced over the wire and into the stomach carrying the gastrostomy tube into the stomach. The retention balloon was filled with 10 mL saline. Contrast was injected through the tube confirming its  location within the stomach. The tube was then secured with the external bumper and capped. The patient will be observed for several hours with the newly placed tube on low wall suction to evaluate for any post procedure complication. The patient tolerated the procedure well, there is no immediate complication. IMPRESSION: Successful placement of an 37 French balloon retention gastrostomy tube. Electronically Signed   By: Wilkie Lent M.D.   On: 07/05/2024 14:29   DG Chest 1 View Result Date: 07/03/2024 EXAM: 1 VIEW(S) XRAY OF THE CHEST 07/03/2024 05:10:42 AM COMPARISON: PA and lateral radiographs of the chest dated 10/31/2012. CLINICAL HISTORY: worsening weakness FINDINGS: LINES, TUBES AND DEVICES: Monitor wires noted. LUNGS AND PLEURA: No focal pulmonary opacity. No pulmonary edema. No pleural effusion. No pneumothorax. HEART AND MEDIASTINUM: No acute abnormality of the cardiac and mediastinal silhouettes. BONES AND SOFT TISSUES: No acute osseous abnormality. IMPRESSION: 1. No acute process. Electronically signed by: Evalene Coho MD 07/03/2024 05:20 AM EST RP Workstation: [id number]    ASSESSMENT: Stage III EBV positive nasopharyngeal carcinoma.  PLAN:    Stage III EBV positive nasopharyngeal carcinoma: PET scan results from May 26, 2024 reviewed independently confirming stage of disease.  Patient will benefit from concurrent chemotherapy using weekly cisplatin  along with daily XRT.  We discussed the possibility of port placement, but patient previously declined.  Proceed with cycle 5 of weekly cisplatin  today.  Continue daily XRT completing on August 10, 2024.  Return to clinic in 1 week for further evaluation and consideration of cycle 6.   Poor appetite/dysphagia: Patient now has PEG tube in place.  Appreciate dietary input.  Continue tube feeds as prescribed.   Anxiety/insomnia: Continue Xanax  as prescribed.  Patient has been instructed to take 0.5 mg nightly. Nausea: Continue  Zofran  and Compazine  as prescribed.  Patient also received IV antiemetics in clinic today. Constipation: Patient does not complain of this today.  Continue stool softeners. Back pain: Patient does not complain of this today.  Continue Flexeril  as needed. Leukopenia: Total white blood cell count improved to 3.7.  Monitor. Anemia: Hemoglobin has trended down to 9.8, monitor.   Hypokalemia: Resolved.   Dysphagia: Continue Magic mouthwash as prescribed.  PEG tube as above.   Patient expressed understanding and was in agreement with this plan. She also understands that She can call clinic at any time with any questions, concerns, or complaints.    Cancer Staging  Nasopharyngeal carcinoma [facility]) Staging form: Pharynx - Nasopharynx, AJCC V9 - Clinical stage from 05/30/2024: Stage III (cTX, cN3, cM0) - Signed by [name], MD on 05/30/2024 Stage prefix: Initial diagnosis Method of lymph node assessment: Clinical   Evalene PARAS Jacobo, MD   07/21/2024 7:37 AM

## 2024-07-20 NOTE — Progress Notes (Signed)
 [facility] CSW Progress Note  Clinical Social Work Intern met with patient in infusion suite to follow-up on need for community resources and emotional support.Patient reports she has continued to struggle with treatment side effects and was in the hospital again this week. Patient also said she has had trouble sleeping, and has already spoken to providers and adjusted medications accordingly.    Interventions: Provided brief mental health counseling with regard to emotional strain of illness, treatment side effects, and life changes.  Patient still has not heard from [facility] re disability since her last hospital stay week of 07/03/24. CSW provided direct contact for [facility] and sent email to Lori Sambol. Encouraged pt to call Medicaid phone number on the back of her insurance card re transportation benefit.      Follow Up Plan:  CSW will see patient on 08/03/24. Provided patient with CSW Intern direct contact info and encouraged her to call in the meantime in needs arise.    Thersia KATHEE Daring Clinical Social Work Intern [employer]    Patient is participating in a Managed Medicaid Plan:  Yes

## 2024-07-20 NOTE — Progress Notes (Signed)
 Patient is having trouble sleeping even with the xanax . She is still having nausea, constipation and diarrhea. Having stomach pain.

## 2024-07-21 ENCOUNTER — Encounter: Payer: Self-pay | Admitting: Oncology

## 2024-07-21 ENCOUNTER — Other Ambulatory Visit: Payer: Self-pay

## 2024-07-21 ENCOUNTER — Ambulatory Visit
Admission: RE | Admit: 2024-07-21 | Discharge: 2024-07-21 | Disposition: A | Discharge status: Home / Self Care | Source: Ambulatory Visit | Attending: Radiation Oncology | Admitting: Radiation Oncology

## 2024-07-21 LAB — RAD ONC ARIA SESSION SUMMARY
Course Elapsed Days: 38
Plan Fractions Treated to Date: 3
Plan Prescribed Dose Per Fraction: 2 Gy
Plan Total Fractions Prescribed: 15
Plan Total Prescribed Dose: 30 Gy
Reference Point Dosage Given to Date: 46 Gy
Reference Point Session Dosage Given: 2 Gy
Session Number: 23

## 2024-07-22 ENCOUNTER — Emergency Department
Admission: EM | Admit: 2024-07-22 | Discharge: 2024-07-23 | Disposition: A | Attending: Emergency Medicine | Admitting: Emergency Medicine

## 2024-07-22 ENCOUNTER — Other Ambulatory Visit: Payer: Self-pay

## 2024-07-22 ENCOUNTER — Emergency Department

## 2024-07-22 DIAGNOSIS — R059 Cough, unspecified: Secondary | ICD-10-CM | POA: Diagnosis not present

## 2024-07-22 DIAGNOSIS — R052 Subacute cough: Secondary | ICD-10-CM | POA: Insufficient documentation

## 2024-07-22 DIAGNOSIS — R070 Pain in throat: Secondary | ICD-10-CM | POA: Diagnosis present

## 2024-07-22 DIAGNOSIS — R110 Nausea: Secondary | ICD-10-CM | POA: Diagnosis present

## 2024-07-22 LAB — COMPREHENSIVE METABOLIC PANEL WITH GFR
ALT: 61 U/L — ABNORMAL HIGH (ref 0–44)
AST: 25 U/L (ref 15–41)
Albumin: 4.3 g/dL (ref 3.5–5.0)
Alkaline Phosphatase: 69 U/L (ref 38–126)
Anion gap: 14 (ref 5–15)
BUN: 16 mg/dL (ref 6–20)
CO2: 24 mmol/L (ref 22–32)
Calcium: 9.7 mg/dL (ref 8.9–10.3)
Chloride: 101 mmol/L (ref 98–111)
Creatinine, Ser: 0.89 mg/dL (ref 0.44–1.00)
GFR, Estimated: >60 mL/min (ref >60)
Glucose, Bld: 94 mg/dL (ref 70–99)
Potassium: 3.6 mmol/L (ref 3.5–5.1)
Sodium: 138 mmol/L (ref 135–145)
Total Bilirubin: 0.5 mg/dL (ref 0.0–1.2)
Total Protein: 7.1 g/dL (ref 6.5–8.1)

## 2024-07-22 LAB — CBC WITH DIFFERENTIAL/PLATELET
Abs Immature Granulocytes: 0.01 K/uL (ref 0.00–0.07)
Basophils Absolute: 0 K/uL (ref 0.0–0.1)
Basophils Relative: 0 %
Eosinophils Absolute: 0 K/uL (ref 0.0–0.5)
Eosinophils Relative: 0 %
HCT: 29.7 % — ABNORMAL LOW (ref 36.0–46.0)
Hemoglobin: 9.8 g/dL — ABNORMAL LOW (ref 12.0–15.0)
Immature Granulocytes: 0 %
Lymphocytes Relative: 10 %
Lymphs Abs: 0.3 K/uL — ABNORMAL LOW (ref 0.7–4.0)
MCH: 30.8 pg (ref 26.0–34.0)
MCHC: 33 g/dL (ref 30.0–36.0)
MCV: 93.4 fL (ref 80.0–100.0)
Monocytes Absolute: 0.4 K/uL (ref 0.1–1.0)
Monocytes Relative: 14 %
Neutro Abs: 2.4 K/uL (ref 1.7–7.7)
Neutrophils Relative %: 76 %
Platelets: 473 K/uL — ABNORMAL HIGH (ref 150–400)
RBC: 3.18 MIL/uL — ABNORMAL LOW (ref 3.87–5.11)
RDW: 17.2 % — ABNORMAL HIGH (ref 11.5–15.5)
WBC: 3.1 K/uL — ABNORMAL LOW (ref 4.0–10.5)
nRBC: 0 % (ref 0.0–0.2)

## 2024-07-22 LAB — MAGNESIUM: Magnesium: 2 mg/dL (ref 1.7–2.4)

## 2024-07-22 LAB — LACTIC ACID, PLASMA: Lactic Acid, Venous: 1.7 mmol/L (ref 0.5–1.9)

## 2024-07-22 LAB — TROPONIN T, HIGH SENSITIVITY: Troponin T High Sensitivity: <15 ng/L (ref 0–19)

## 2024-07-22 MED ORDER — HYDROCOD POLI-CHLORPHE POLI ER 10-8 MG/5ML PO SUER
5.0000 mL | Freq: Once | ORAL | Status: AC
  Administered 2024-07-22: 5 mL via ORAL
  Filled 2024-07-22: qty 5

## 2024-07-22 MED ORDER — LORAZEPAM 2 MG/ML IJ SOLN
1.0000 mg | Freq: Once | INTRAMUSCULAR | Status: AC
  Administered 2024-07-22: 1 mg via INTRAVENOUS
  Filled 2024-07-22: qty 1

## 2024-07-22 MED ORDER — KETOROLAC TROMETHAMINE 30 MG/ML IJ SOLN
15.0000 mg | Freq: Once | INTRAMUSCULAR | Status: AC
  Administered 2024-07-22: 15 mg via INTRAVENOUS
  Filled 2024-07-22: qty 1

## 2024-07-22 MED ORDER — LACTATED RINGERS IV BOLUS
1000.0000 mL | Freq: Once | INTRAVENOUS | Status: AC
  Administered 2024-07-22: 1000 mL via INTRAVENOUS

## 2024-07-22 MED ORDER — HYDROCOD POLI-CHLORPHE POLI ER 10-8 MG/5ML PO SUER: 5.0000 mL | Freq: Two times a day (BID) | ORAL | 0 refills | Status: DC | PRN

## 2024-07-22 MED ORDER — DEXAMETHASONE SOD PHOSPHATE PF 10 MG/ML IJ SOLN
10.0000 mg | Freq: Once | INTRAMUSCULAR | Status: AC
  Administered 2024-07-22: 10 mg via INTRAVENOUS

## 2024-07-22 MED ORDER — PREDNISONE 50 MG PO TABS: 50.0000 mg | ORAL_TABLET | Freq: Every day | ORAL | 0 refills | Status: AC

## 2024-07-22 NOTE — ED Provider Notes (Signed)
 [facility] Provider Note    Event Date/Time   First MD Initiated Contact with Patient 07/22/24 1847     (approximate)   History   throat pain and swelling    HPI  Brandi [name] is a 28 y.o. female who presents to the ED for evaluation of throat pain and swelling    I reviewed oncology clinic visit from 2 days ago.  History of stage III nasopharyngeal carcinoma.  Continues on radiation.  S/p chemotherapy.  Dysphagia and 100% nutrition via PEG tube.  Patient presents to the ED due to 2 weeks of a cough and associated sore throat and not feeling well.  She reports concern that her throat might be more swollen.  She reports still being able to swallow and vocalize at her baseline.  She has had a PEG tube for the past 2 weeks to supplement nutrition but she reports taking medications by mouth and trying to drink and eat small amounts by mouth as well, and has been able to continue to do this at her baseline.  Physical Exam   Triage Vital Signs: ED Triage Vitals  Encounter Vitals Group     BP 07/22/24 1830 (!) 144/101     Girls Systolic BP Percentile --      Girls Diastolic BP Percentile --      Boys Systolic BP Percentile --      Boys Diastolic BP Percentile --      Pulse Rate 07/22/24 1830 99     Resp 07/22/24 1830 18     Temp 07/22/24 1830 98.8 F (37.1 C)     Temp Source 07/22/24 1830 Oral     SpO2 07/22/24 1830 99 %     Weight 07/22/24 1837 195 lb (88.5 kg)     Height 07/22/24 1837 5' 2 (1.575 m)     Head Circumference --      Peak Flow --      Pain Score 07/22/24 1835 8     Pain Loc --      Pain Education --      Exclude from Growth Chart --     Most recent vital signs: Vitals:   07/22/24 2138 07/22/24 2200  BP: (!) 159/104 (!) 142/90  Pulse: 97 94  Resp: (!) 24 17  Temp: 98 F (36.7 C)   SpO2: 100% 95%    General: Awake, no distress.  Generally well-appearing, anxious CV:  Good peripheral perfusion.  Resp:  Normal effort.  No  wheezing Abd:  No distention.  PEG tube to the upper abdomen, soft and nontender abdomen, clean insertion site MSK:  No deformity noted.  Neuro:  No focal deficits appreciated. Other:     ED Results / Procedures / Treatments   Labs (all labs ordered are listed, but only abnormal results are displayed) Labs Reviewed  COMPREHENSIVE METABOLIC PANEL WITH GFR - Abnormal; Notable for the following components:      Result Value   ALT 61 (*)    All other components within normal limits  CBC WITH DIFFERENTIAL/PLATELET - Abnormal; Notable for the following components:   WBC 3.1 (*)    RBC 3.18 (*)    Hemoglobin 9.8 (*)    HCT 29.7 (*)    RDW 17.2 (*)    Platelets 473 (*)    Lymphs Abs 0.3 (*)    All other components within normal limits  CULTURE, BLOOD (SINGLE)  MAGNESIUM   LACTIC ACID, PLASMA  CBC WITH DIFFERENTIAL/PLATELET  TROPONIN T, HIGH SENSITIVITY  TROPONIN T, HIGH SENSITIVITY    EKG   RADIOLOGY CXR interpreted by me without evidence of acute cardiopulmonary pathology.  Official radiology report(s): DG Chest 2 View Result Date: 07/22/2024 EXAM: 2 VIEW(S) XRAY OF THE CHEST 07/22/2024 08:34:16 PM COMPARISON: 07/03/2024 CLINICAL HISTORY: productive cough, eval infiltrate FINDINGS: LUNGS AND PLEURA: No focal pulmonary opacity. No pleural effusion. No pneumothorax. HEART AND MEDIASTINUM: No acute abnormality of the cardiac and mediastinal silhouettes. BONES AND SOFT TISSUES: No acute osseous abnormality. IMPRESSION: 1. No acute cardiopulmonary process. Electronically signed by: Elsie Gravely MD 07/22/2024 08:39 PM EST RP Workstation: [id number]    PROCEDURES and INTERVENTIONS:  .1-3 Lead EKG Interpretation  Performed by: Claudene Rover, MD Authorized by: Claudene Rover, MD     Interpretation: normal     ECG rate:  88   ECG rate assessment: normal     Rhythm: sinus rhythm     Ectopy: none     Conduction: normal   .Ultrasound ED Peripheral IV (Provider)  Date/Time:  07/22/2024 11:35 PM  Performed by: Claudene Rover, MD Authorized by: Claudene Rover, MD   Procedure details:    Indications: multiple failed IV attempts and poor IV access     Skin Prep: chlorhexidine gluconate     Location: right cephalic v.   Angiocath:  20 G   Bedside Ultrasound Guided: Yes     Images: not archived     Patient tolerated procedure without complications: Yes     Dressing applied: Yes     Medications  lactated ringers  bolus 1,000 mL (1,000 mLs Intravenous New Bag/Given 07/22/24 2015)  LORazepam  (ATIVAN ) injection 1 mg (1 mg Intravenous Given 07/22/24 2014)  chlorpheniramine-HYDROcodone  (TUSSIONEX) 10-8 MG/5ML suspension 5 mL (5 mLs Oral Given 07/22/24 2048)  ketorolac  (TORADOL ) 30 MG/ML injection 15 mg (15 mg Intravenous Given 07/22/24 2048)  dexamethasone  (DECADRON ) injection 10 mg (10 mg Intravenous Given 07/22/24 2048)     IMPRESSION / MDM / ASSESSMENT AND PLAN / ED COURSE  I reviewed the triage vital signs and the nursing notes.  Differential diagnosis includes, but is not limited to, viral syndrome, bronchitis, pneumonia, pneumothorax, sepsis, upper airway obstruction  {Patient presents with symptoms of an acute illness or injury that is potentially life-threatening.  Patient with head and neck cancer presents with 2 weeks of a cough and feeling unwell.  Looks well on exam, satting well on room air and without signs of systemic illness.  CBC at baseline with chronic leukopenia.  CXR is clear.  Will provide supportive care and reassess  Patient reports feeling better after antitussives and steroids.  No infiltrates on x-ray, reassuring metabolic panel, electrolytes, lactic acid and troponin.  No clear indication for antibiotics at this point.  Can discharge with a few days of steroids and as needed antitussive  and have her follow-up with her oncologist.  Discussed ED return precautions  Clinical Course as of 07/22/24 2335  Sat Jul 22, 2024  1959 USIV [DS]  2000  2 weeks cough, productive [DS]  2319 Reassessed.  Feeling better.  She is comfortable going home and I think that is reasonable. [DS]    Clinical Course User Index [DS] Claudene Rover, MD     FINAL CLINICAL IMPRESSION(S) / ED DIAGNOSES   Final diagnoses:  Subacute cough     Rx / DC Orders   ED Discharge Orders          Ordered    predniSONE  (DELTASONE ) 50 MG tablet  Daily  07/22/24 2321    chlorpheniramine-HYDROcodone  (TUSSIONEX) 10-8 MG/5ML  Every 12 hours PRN        07/22/24 2321             Note:  This document was prepared using Dragon voice recognition software and may include unintentional dictation errors.   Claudene Rover, MD 07/22/24 [phone]

## 2024-07-22 NOTE — ED Notes (Signed)
 Osmolite and 250 waterfed into G tube,

## 2024-07-22 NOTE — ED Triage Notes (Signed)
 Pt to ED for throat swelling since just PTA. Pt is getting radiation to head and neck for cancer. Last treatment yesterday. Voice is clear. Is swallowing saliva and fluids. States throat is 'burning.' Pt is also having pain to surgical site where PEG tube was placed 2 weeks ago. No redness or swelling noted.  Respirations are unlabored.

## 2024-07-23 ENCOUNTER — Other Ambulatory Visit: Payer: Self-pay

## 2024-07-23 ENCOUNTER — Emergency Department
Admission: EM | Admit: 2024-07-23 | Discharge: 2024-07-24 | Disposition: A | Discharge status: Home / Self Care | Source: Home / Self Care | Attending: Emergency Medicine | Admitting: Emergency Medicine

## 2024-07-23 ENCOUNTER — Ambulatory Visit

## 2024-07-23 DIAGNOSIS — R110 Nausea: Secondary | ICD-10-CM | POA: Insufficient documentation

## 2024-07-23 DIAGNOSIS — R059 Cough, unspecified: Secondary | ICD-10-CM | POA: Insufficient documentation

## 2024-07-23 NOTE — ED Triage Notes (Signed)
 Pt to ed from home via POV for G tube issues. Pt was here yesterday for similar. Pt is caox4, in no acute distress and ambulatory to triage. Pt had labs collected yesterday as well and would like to wait to see doctor before being stuck. Pt was unable to get her meds today due to pharmacy being closed.

## 2024-07-23 NOTE — ED Provider Notes (Signed)
   [facility] Provider Note    Event Date/Time   First MD Initiated Contact with Patient 07/23/24 2348     (approximate)   History   G tube issues   HPI  Brandi [name] is a 28 y.o. 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  I reviewed the notes for patient was seen yesterday due to concerns for cough and feeling unwell.  She was discharged on steroids and Tussionex.  Patient had PEG tube placement in November.  Physical Exam   Triage Vital Signs: ED Triage Vitals [07/23/24 2303]  Encounter Vitals Group     BP (!) 152/100     Girls Systolic BP Percentile      Girls Diastolic BP Percentile      Boys Systolic BP Percentile      Boys Diastolic BP Percentile      Pulse Rate 100     Resp 16     Temp 98 F (36.7 C)     Temp Source Oral     SpO2 100 %     Weight      Height 5' 2 (1.575 m)     Head Circumference      Peak Flow      Pain Score 4     Pain Loc      Pain Education      Exclude from Growth Chart     Most recent vital signs: Vitals:   07/23/24 2303  BP: (!) 152/100  Pulse: 100  Resp: 16  Temp: 98 F (36.7 C)  SpO2: 100%     General: Awake, no distress.  CV:  Good peripheral perfusion.  Resp:  Normal effort.  Abd:  No distention.  Other:     ED Results / Procedures / Treatments   Labs (all labs ordered are listed, but only abnormal results are displayed) Labs Reviewed - No data to display   EKG  My interpretation of EKG:    RADIOLOGY I have reviewed the xray personally and interpreted    PROCEDURES:  Critical Care performed: {CriticalCareYesNo:19197::Yes, see critical care procedure note(s),No}  Procedures   MEDICATIONS ORDERED IN ED: Medications - No data to display   IMPRESSION / MDM / ASSESSMENT AND PLAN / ED COURSE  I reviewed the triage vital signs and the nursing  notes.   Patient's presentation is most consistent with {EM COPA:27473}     The patient is on the cardiac monitor to evaluate for evidence of arrhythmia and/or significant heart rate changes.      FINAL CLINICAL IMPRESSION(S) / ED DIAGNOSES   Final diagnoses:  None     Rx / DC Orders   ED Discharge Orders     None        Note:  This document was prepared using Dragon voice recognition software and may include unintentional dictation errors.

## 2024-07-24 ENCOUNTER — Ambulatory Visit: Admission: RE | Admit: 2024-07-24 | Source: Ambulatory Visit

## 2024-07-24 ENCOUNTER — Ambulatory Visit

## 2024-07-24 ENCOUNTER — Other Ambulatory Visit: Payer: Self-pay

## 2024-07-24 ENCOUNTER — Emergency Department

## 2024-07-24 ENCOUNTER — Ambulatory Visit
Admission: RE | Admit: 2024-07-24 | Discharge: 2024-07-24 | Disposition: A | Discharge status: Home / Self Care | Source: Ambulatory Visit | Attending: Radiation Oncology | Admitting: Radiation Oncology

## 2024-07-24 LAB — RAD ONC ARIA SESSION SUMMARY
Course Elapsed Days: 41
Plan Fractions Treated to Date: 4
Plan Prescribed Dose Per Fraction: 2 Gy
Plan Total Fractions Prescribed: 15
Plan Total Prescribed Dose: 30 Gy
Reference Point Dosage Given to Date: 48 Gy
Reference Point Session Dosage Given: 2 Gy
Session Number: 24

## 2024-07-24 LAB — COMPREHENSIVE METABOLIC PANEL WITH GFR
ALT: 73 U/L — ABNORMAL HIGH (ref 0–44)
AST: 36 U/L (ref 15–41)
Albumin: 4.2 g/dL (ref 3.5–5.0)
Alkaline Phosphatase: 65 U/L (ref 38–126)
Anion gap: 13 (ref 5–15)
BUN: 15 mg/dL (ref 6–20)
CO2: 20 mmol/L — ABNORMAL LOW (ref 22–32)
Calcium: 9.3 mg/dL (ref 8.9–10.3)
Chloride: 101 mmol/L (ref 98–111)
Creatinine, Ser: 0.8 mg/dL (ref 0.44–1.00)
GFR, Estimated: >60 mL/min (ref >60)
Glucose, Bld: 101 mg/dL — ABNORMAL HIGH (ref 70–99)
Potassium: 3.8 mmol/L (ref 3.5–5.1)
Sodium: 134 mmol/L — ABNORMAL LOW (ref 135–145)
Total Bilirubin: 0.4 mg/dL (ref 0.0–1.2)
Total Protein: 6.9 g/dL (ref 6.5–8.1)

## 2024-07-24 LAB — CBC WITH DIFFERENTIAL/PLATELET
Abs Immature Granulocytes: 0.02 K/uL (ref 0.00–0.07)
Basophils Absolute: 0 K/uL (ref 0.0–0.1)
Basophils Relative: 0 %
Eosinophils Absolute: 0 K/uL (ref 0.0–0.5)
Eosinophils Relative: 0 %
HCT: 27.9 % — ABNORMAL LOW (ref 36.0–46.0)
Hemoglobin: 9.6 g/dL — ABNORMAL LOW (ref 12.0–15.0)
Immature Granulocytes: 1 %
Lymphocytes Relative: 6 %
Lymphs Abs: 0.2 K/uL — ABNORMAL LOW (ref 0.7–4.0)
MCH: 31 pg (ref 26.0–34.0)
MCHC: 34.4 g/dL (ref 30.0–36.0)
MCV: 90 fL (ref 80.0–100.0)
Monocytes Absolute: 0.4 K/uL (ref 0.1–1.0)
Monocytes Relative: 10 %
Neutro Abs: 3.2 K/uL (ref 1.7–7.7)
Neutrophils Relative %: 83 %
Platelets: 444 K/uL — ABNORMAL HIGH (ref 150–400)
RBC: 3.1 MIL/uL — ABNORMAL LOW (ref 3.87–5.11)
RDW: 16.7 % — ABNORMAL HIGH (ref 11.5–15.5)
WBC: 3.9 K/uL — ABNORMAL LOW (ref 4.0–10.5)
nRBC: 0 % (ref 0.0–0.2)

## 2024-07-24 LAB — URINALYSIS, ROUTINE W REFLEX MICROSCOPIC
Bilirubin Urine: NEGATIVE
Glucose, UA: NEGATIVE mg/dL
Hgb urine dipstick: NEGATIVE
Ketones, ur: NEGATIVE mg/dL
Leukocytes,Ua: NEGATIVE
Nitrite: NEGATIVE
Protein, ur: NEGATIVE mg/dL
Specific Gravity, Urine: 1.02 (ref 1.005–1.030)
pH: 7 (ref 5.0–8.0)

## 2024-07-24 LAB — RESP PANEL BY RT-PCR (RSV, FLU A&B, COVID)  RVPGX2
Influenza A by PCR: NEGATIVE
Influenza B by PCR: NEGATIVE
Resp Syncytial Virus by PCR: NEGATIVE
SARS Coronavirus 2 by RT PCR: NEGATIVE

## 2024-07-24 LAB — PREGNANCY, URINE: Preg Test, Ur: NEGATIVE

## 2024-07-24 LAB — GROUP A STREP BY PCR: Group A Strep by PCR: NOT DETECTED

## 2024-07-24 LAB — HCG, QUANTITATIVE, PREGNANCY: hCG, Beta Chain, Quant, S: <1 m[IU]/mL (ref <5)

## 2024-07-24 LAB — LIPASE, BLOOD: Lipase: 21 U/L (ref 11–51)

## 2024-07-24 MED ORDER — PANTOPRAZOLE SODIUM 40 MG IV SOLR
40.0000 mg | Freq: Once | INTRAVENOUS | Status: AC
  Administered 2024-07-24: 40 mg via INTRAVENOUS
  Filled 2024-07-24: qty 10

## 2024-07-24 MED ORDER — HYDROMORPHONE HCL 1 MG/ML IJ SOLN
0.5000 mg | Freq: Once | INTRAMUSCULAR | Status: AC
  Administered 2024-07-24: 0.5 mg via INTRAVENOUS
  Filled 2024-07-24: qty 0.5

## 2024-07-24 MED ORDER — ONDANSETRON HCL 4 MG/2ML IJ SOLN
4.0000 mg | Freq: Once | INTRAMUSCULAR | Status: AC
  Administered 2024-07-24: 4 mg via INTRAVENOUS
  Filled 2024-07-24: qty 2

## 2024-07-24 MED ORDER — SODIUM CHLORIDE 0.9 % IV BOLUS
1000.0000 mL | Freq: Once | INTRAVENOUS | Status: AC
  Administered 2024-07-24: 1000 mL via INTRAVENOUS

## 2024-07-24 MED ORDER — PREDNISOLONE 15 MG/5ML PO SOLN: 50.0000 mg | Freq: Every day | ORAL | 0 refills | Status: DC

## 2024-07-24 MED ORDER — PANTOPRAZOLE SODIUM 40 MG PO PACK: 40.0000 mg | PACK | Freq: Every day | ORAL | 0 refills | Status: DC

## 2024-07-24 MED ORDER — PREDNISOLONE SODIUM PHOSPHATE 15 MG/5ML PO SOLN
50.0000 mg | Freq: Once | ORAL | Status: AC
  Administered 2024-07-24: 50 mg
  Filled 2024-07-24: qty 20

## 2024-07-24 NOTE — ED Notes (Signed)
 Pt discharged to home, instructions and medications reviewed.  Pt verbalized understanding, voices no questions at this time.

## 2024-07-24 NOTE — ED Provider Notes (Incomplete)
   [facility] Provider Note    Event Date/Time   First MD Initiated Contact with Patient 07/23/24 2348     (approximate)   History   G tube issues   HPI  Brandi [name] is a 28 y.o. 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  I reviewed the notes for patient was seen yesterday due to concerns for cough and feeling unwell.  She was discharged on steroids and Tussionex.  Patient had PEG tube placement in November.    Physical Exam   Triage Vital Signs: ED Triage Vitals [07/23/24 2303]  Encounter Vitals Group     BP (!) 152/100     Girls Systolic BP Percentile      Girls Diastolic BP Percentile      Boys Systolic BP Percentile      Boys Diastolic BP Percentile      Pulse Rate 100     Resp 16     Temp 98 F (36.7 C)     Temp Source Oral     SpO2 100 %     Weight      Height 5' 2 (1.575 m)     Head Circumference      Peak Flow      Pain Score 4     Pain Loc      Pain Education      Exclude from Growth Chart     Most recent vital signs: Vitals:   07/23/24 2303  BP: (!) 152/100  Pulse: 100  Resp: 16  Temp: 98 F (36.7 C)  SpO2: 100%     General: Awake, no distress.  CV:  Good peripheral perfusion.  Resp:  Normal effort.  Abd:  No distention.  Other:     ED Results / Procedures / Treatments   Labs (all labs ordered are listed, but only abnormal results are displayed) Labs Reviewed - No data to display   EKG  My interpretation of EKG:    RADIOLOGY I have reviewed the xray personally and interpreted    PROCEDURES:  Critical Care performed: {CriticalCareYesNo:19197::Yes, see critical care procedure note(s),No}  Procedures   MEDICATIONS ORDERED IN ED: Medications - No data to display   IMPRESSION / MDM / ASSESSMENT AND PLAN / ED COURSE  I reviewed the triage vital signs and the nursing  notes.   Patient's presentation is most consistent with {EM COPA:27473}     The patient is on the cardiac monitor to evaluate for evidence of arrhythmia and/or significant heart rate changes.      FINAL CLINICAL IMPRESSION(S) / ED DIAGNOSES   Final diagnoses:  None     Rx / DC Orders   ED Discharge Orders     None        Note:  This document was prepared using Dragon voice recognition software and may include unintentional dictation errors.

## 2024-07-24 NOTE — ED Notes (Signed)
 Pt given her full tube feed, tolerated well, denies nausea at this time.

## 2024-07-24 NOTE — Discharge Instructions (Addendum)
 I have switched to the prednisone  that was prescribed to be back to baseline and by mouth. I have also started you on an acid reducer to take daily to try to help with the burning sensation that you have.  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  If you develop fevers, worsening pain or any other concerns you should return to the ER for repeat evaluation

## 2024-07-25 ENCOUNTER — Other Ambulatory Visit: Payer: Self-pay

## 2024-07-25 ENCOUNTER — Ambulatory Visit
Admission: RE | Admit: 2024-07-25 | Discharge: 2024-07-25 | Disposition: A | Discharge status: Home / Self Care | Source: Ambulatory Visit | Attending: Radiation Oncology | Admitting: Radiation Oncology

## 2024-07-25 LAB — RAD ONC ARIA SESSION SUMMARY
Course Elapsed Days: 42
Plan Fractions Treated to Date: 5
Plan Prescribed Dose Per Fraction: 2 Gy
Plan Total Fractions Prescribed: 15
Plan Total Prescribed Dose: 30 Gy
Reference Point Dosage Given to Date: 50 Gy
Reference Point Session Dosage Given: 2 Gy
Session Number: 25

## 2024-07-26 ENCOUNTER — Other Ambulatory Visit: Payer: Self-pay

## 2024-07-26 ENCOUNTER — Other Ambulatory Visit

## 2024-07-26 ENCOUNTER — Ambulatory Visit
Admission: RE | Admit: 2024-07-26 | Discharge: 2024-07-26 | Disposition: A | Discharge status: Home / Self Care | Source: Ambulatory Visit | Attending: Radiation Oncology | Admitting: Radiation Oncology

## 2024-07-26 ENCOUNTER — Inpatient Hospital Stay

## 2024-07-26 ENCOUNTER — Inpatient Hospital Stay (HOSPITAL_BASED_OUTPATIENT_CLINIC_OR_DEPARTMENT_OTHER): Admitting: Oncology

## 2024-07-26 ENCOUNTER — Ambulatory Visit

## 2024-07-26 ENCOUNTER — Ambulatory Visit: Admitting: Oncology

## 2024-07-26 ENCOUNTER — Encounter: Payer: Self-pay | Admitting: Oncology

## 2024-07-26 VITALS — BP 118/73 | HR 102 | Temp 98.0°F | Resp 18 | Ht 62.0 in | Wt 191.2 lb

## 2024-07-26 VITALS — BP 137/93 | HR 85

## 2024-07-26 DIAGNOSIS — C119 Malignant neoplasm of nasopharynx, unspecified: Secondary | ICD-10-CM

## 2024-07-26 LAB — CBC WITH DIFFERENTIAL (CANCER CENTER ONLY)
Abs Immature Granulocytes: 0.03 K/uL (ref 0.00–0.07)
Basophils Absolute: 0 K/uL (ref 0.0–0.1)
Basophils Relative: 0 %
Eosinophils Absolute: 0 K/uL (ref 0.0–0.5)
Eosinophils Relative: 0 %
HCT: 30.2 % — ABNORMAL LOW (ref 36.0–46.0)
Hemoglobin: 10.2 g/dL — ABNORMAL LOW (ref 12.0–15.0)
Immature Granulocytes: 1 %
Lymphocytes Relative: 8 %
Lymphs Abs: 0.3 K/uL — ABNORMAL LOW (ref 0.7–4.0)
MCH: 30.5 pg (ref 26.0–34.0)
MCHC: 33.8 g/dL (ref 30.0–36.0)
MCV: 90.4 fL (ref 80.0–100.0)
Monocytes Absolute: 0.4 K/uL (ref 0.1–1.0)
Monocytes Relative: 9 %
Neutro Abs: 3.2 K/uL (ref 1.7–7.7)
Neutrophils Relative %: 82 %
Platelet Count: 491 K/uL — ABNORMAL HIGH (ref 150–400)
RBC: 3.34 MIL/uL — ABNORMAL LOW (ref 3.87–5.11)
RDW: 16.6 % — ABNORMAL HIGH (ref 11.5–15.5)
WBC Count: 3.9 K/uL — ABNORMAL LOW (ref 4.0–10.5)
nRBC: 0 % (ref 0.0–0.2)

## 2024-07-26 LAB — RAD ONC ARIA SESSION SUMMARY
Course Elapsed Days: 43
Plan Fractions Treated to Date: 6
Plan Prescribed Dose Per Fraction: 2 Gy
Plan Total Fractions Prescribed: 15
Plan Total Prescribed Dose: 30 Gy
Reference Point Dosage Given to Date: 52 Gy
Reference Point Session Dosage Given: 2 Gy
Session Number: 26

## 2024-07-26 LAB — BASIC METABOLIC PANEL - CANCER CENTER ONLY
Anion gap: 9 (ref 5–15)
BUN: 13 mg/dL (ref 6–20)
CO2: 25 mmol/L (ref 22–32)
Calcium: 9.3 mg/dL (ref 8.9–10.3)
Chloride: 101 mmol/L (ref 98–111)
Creatinine: 0.84 mg/dL (ref 0.44–1.00)
GFR, Estimated: >60 mL/min (ref >60)
Glucose, Bld: 112 mg/dL — ABNORMAL HIGH (ref 70–99)
Potassium: 3.6 mmol/L (ref 3.5–5.1)
Sodium: 135 mmol/L (ref 135–145)

## 2024-07-26 LAB — MAGNESIUM: Magnesium: 1.8 mg/dL (ref 1.7–2.4)

## 2024-07-26 MED ORDER — MAGNESIUM SULFATE 2 GM/50ML IV SOLN
2.0000 g | Freq: Once | INTRAVENOUS | Status: AC
  Administered 2024-07-26: 2 g via INTRAVENOUS
  Filled 2024-07-26: qty 50

## 2024-07-26 MED ORDER — APREPITANT 130 MG/18ML IV EMUL
130.0000 mg | Freq: Once | INTRAVENOUS | Status: AC
  Administered 2024-07-26: 130 mg via INTRAVENOUS
  Filled 2024-07-26: qty 18

## 2024-07-26 MED ORDER — PALONOSETRON HCL INJECTION 0.25 MG/5ML
0.2500 mg | Freq: Once | INTRAVENOUS | Status: AC
  Administered 2024-07-26: 0.25 mg via INTRAVENOUS
  Filled 2024-07-26: qty 5

## 2024-07-26 MED ORDER — DEXAMETHASONE SOD PHOSPHATE PF 10 MG/ML IJ SOLN
10.0000 mg | Freq: Once | INTRAMUSCULAR | Status: AC
  Administered 2024-07-26: 10 mg via INTRAVENOUS

## 2024-07-26 MED ORDER — POTASSIUM CHLORIDE IN NACL 20-0.9 MEQ/L-% IV SOLN
Freq: Once | INTRAVENOUS | Status: AC
  Filled 2024-07-26: qty 1000

## 2024-07-26 MED ORDER — SODIUM CHLORIDE 0.9 % IV SOLN
INTRAVENOUS | Status: DC
  Filled 2024-07-26: qty 250

## 2024-07-26 MED ORDER — LORAZEPAM 2 MG/ML IJ SOLN
1.0000 mg | Freq: Once | INTRAMUSCULAR | Status: AC
  Administered 2024-07-26: 1 mg via INTRAVENOUS
  Filled 2024-07-26: qty 1

## 2024-07-26 MED ORDER — SODIUM CHLORIDE 0.9 % IV SOLN
40.0000 mg/m2 | Freq: Once | INTRAVENOUS | Status: AC
  Administered 2024-07-26: 84 mg via INTRAVENOUS
  Filled 2024-07-26: qty 84

## 2024-07-26 NOTE — Progress Notes (Unsigned)
 [facility]  [phone] [phone]  ID: Brandi [name] OB: 02-22-1996  MR#: [id number]  RDW#:[id number]  Patient Care Team: [name], MD as PCP - General (Internal Medicine) [name], MD as Consulting Physician (Radiation Oncology) [name], MD as Consulting Physician (Oncology)  CHIEF COMPLAINT: Stage III EBV positive nasopharyngeal carcinoma.  INTERVAL HISTORY: Patient returns to clinic today for repeat laboratory, further evaluation, and consideration of cycle 6 of weekly cisplatin .  She also continues daily XRT. She continues to have significant anxiety.  Her nausea and vomiting have improved and she is tolerating her tube feeds better.  Significant dysphagia and reports nearly 100% of her nutrition comes by her PEG tube. She has no neurologic complaints.  She denies any recent fevers.  She has no chest pain, shortness of breath, cough, or hemoptysis.  She denies any vomiting or diarrhea.  She has no urinary complaints.  Patient offers no further specific complaints today.  REVIEW OF SYSTEMS:   Review of Systems  Constitutional:  Positive for malaise/fatigue. Negative for chills and fever.  HENT:  Negative for sore throat.   Respiratory: Negative.  Negative for cough, hemoptysis and shortness of breath.   Cardiovascular: Negative.  Negative for chest pain and leg swelling.  Gastrointestinal: Negative.  Negative for abdominal pain, constipation, nausea and vomiting.  Genitourinary: Negative.  Negative for dysuria.  Musculoskeletal: Negative.  Negative for back pain.  Skin: Negative.  Negative for rash.  Neurological:  Positive for weakness. Negative for dizziness, focal weakness and headaches.  Psychiatric/Behavioral:  The patient is nervous/anxious.     As per HPI. Otherwise, a complete review of systems is negative.  PAST MEDICAL HISTORY: Past Medical History:  Diagnosis Date   Asthma    Cancer (HCC)    head  and neck, getting radiation   GERD (gastroesophageal reflux disease)    HTN (hypertension)     PAST SURGICAL HISTORY: Past Surgical History:  Procedure Laterality Date   dental procedure     IR GASTROSTOMY TUBE MOD SED  07/05/2024   IR US  LIVER BIOPSY  05/17/2024    FAMILY HISTORY: Family History  Problem Relation Age of Onset   Hypertension Maternal Grandmother    Diabetes Maternal Grandmother     ADVANCED DIRECTIVES (Y/N):  N  HEALTH MAINTENANCE: Social History   Tobacco Use   Smoking status: Former    Types: E-cigarettes   Smokeless tobacco: Never  Vaping Use   Vaping status: Former  Substance Use Topics   Alcohol use: Not Currently    Comment: social   Drug use: Yes    Types: Marijuana     Colonoscopy:  PAP:  Bone density:  Lipid panel:  Allergies  Allergen Reactions   Shellfish Allergy Anaphylaxis   Tomato Other (See Comments)    Flares up exema    Current Outpatient Medications  Medication Sig Dispense Refill   ALPRAZolam  (XANAX ) 0.25 MG tablet Take 1 tablet (0.25 mg total) by mouth at bedtime as needed for anxiety. 30 tablet 0   amLODipine  (NORVASC ) 5 MG tablet Place 1 tablet (5 mg total) into feeding tube daily. 30 tablet 0   chlorpheniramine-HYDROcodone  (TUSSIONEX) 10-8 MG/5ML Take 5 mLs by mouth every 12 (twelve) hours as needed for cough. 70 mL 0   cyclobenzaprine  (FLEXERIL ) 10 MG tablet Take 1 tablet (10 mg total) by mouth 3 (three) times daily as needed for muscle spasms. 90 tablet 0   fluconazole  (DIFLUCAN ) 200 MG tablet Take 1 tablet (  200 mg total) by mouth daily. 7 tablet 0   lactulose  (CHRONULAC ) 10 GM/15ML solution Take 45 mLs (30 g total) by mouth daily as needed for severe constipation. 473 mL 0   lidocaine  (XYLOCAINE ) 2 % solution Use as directed 15 mLs in the mouth or throat every 3 (three) hours as needed for mouth pain. swish and swallow. 100 mL 1   Multiple Vitamin (MULTIVITAMIN WITH MINERALS) TABS tablet Place 1 tablet into feeding  tube daily. 30 tablet 0   Nutritional Supplements (ENSURE CLEAR) LIQD Take 1 Bottle by mouth 2 (two) times daily. 296 mL 6   Nutritional Supplements (FEEDING SUPPLEMENT, OSMOLITE 1.5 CAL,) LIQD Place 119 mLs into feeding tube 6 (six) times daily.     nystatin  (MYCOSTATIN ) 100000 UNIT/ML suspension Take 5 mLs (500,000 Units total) by mouth 4 (four) times daily. 60 mL 0   ondansetron  (ZOFRAN ) 8 MG tablet Place 1 tablet (8 mg total) into feeding tube every 8 (eight) hours as needed for nausea or vomiting. Start on the third day after cisplatin . 20 tablet 0   pantoprazole  sodium (PROTONIX ) 40 mg Place 40 mg into feeding tube daily for 14 days. 14 each 0   phenol (CHLORASEPTIC) 1.4 % LIQD Use as directed 1 spray in the mouth or throat as needed for throat irritation / pain.     potassium chloride  20 MEQ/15ML (10%) SOLN Take 15 mLs (20 mEq total) by mouth daily. 473 mL 0   prednisoLONE  (PRELONE ) 15 MG/5ML SOLN Take 16.7 mLs (50 mg total) by mouth daily before breakfast for 4 days. 66.8 mL 0   prochlorperazine  (COMPAZINE ) 10 MG tablet Take 1 tablet (10 mg total) by mouth every 8 (eight) hours as needed for nausea or vomiting. 30 tablet 0   senna-docusate (SENOKOT-S) 8.6-50 MG tablet Take 1 tablet by mouth 2 (two) times daily. 15 tablet 0   SLYND  4 MG TABS Take 1 tablet by mouth daily.     thiamine  (VITAMIN B-1) 100 MG tablet Place 1 tablet (100 mg total) into feeding tube daily. 7 tablet 0   Water  For Irrigation, Sterile (FREE WATER ) SOLN Place 60 mLs into feeding tube 6 (six) times daily.     No current facility-administered medications for this visit.   Facility-Administered Medications Ordered in Other Visits  Medication Dose Route Frequency Provider Last Rate Last Admin   0.9 %  sodium chloride  infusion   Intravenous Continuous [name], MD 10 mL/hr at 07/13/24 1004 New Bag at 07/13/24 1004    OBJECTIVE: Vitals:   07/26/24 0930  BP: 118/73  Pulse: (!) 102  Resp: 18  Temp: 98 F  (36.7 C)  SpO2: 100%       Body mass index is 34.97 kg/m.    ECOG FS:1 - Symptomatic but completely ambulatory  General: Well-developed, well-nourished, no acute distress. Eyes: Pink conjunctiva, anicteric sclera. HEENT: Normocephalic, moist mucous membranes. Lungs: No audible wheezing or coughing. Heart: Regular rate and rhythm. Abdomen: Soft, nontender, no obvious distention. Musculoskeletal: No edema, cyanosis, or clubbing. Neuro: Alert, answering all questions appropriately. Cranial nerves grossly intact. Skin: No rashes or petechiae noted. Psych: Normal affect.  LAB RESULTS:  Lab Results  Component Value Date   NA 135 07/26/2024   K 3.6 07/26/2024   CL 101 07/26/2024   CO2 25 07/26/2024   GLUCOSE 112 (H) 07/26/2024   BUN 13 07/26/2024   CREATININE 0.84 07/26/2024   CALCIUM 9.3 07/26/2024   PROT 6.9 07/24/2024   ALBUMIN 4.2  07/24/2024   AST 36 07/24/2024   ALT 73 (H) 07/24/2024   ALKPHOS 65 07/24/2024   BILITOT 0.4 07/24/2024   GFRNONAA >60 07/26/2024   GFRAA >60 12/05/2019    Lab Results  Component Value Date   WBC 3.9 (L) 07/26/2024   NEUTROABS 3.2 07/26/2024   HGB 10.2 (L) 07/26/2024   HCT 30.2 (L) 07/26/2024   MCV 90.4 07/26/2024   PLT 491 (H) 07/26/2024     STUDIES: DG Chest Port 1 View Result Date: 07/24/2024 EXAM: 1 VIEW(S) XRAY OF THE CHEST 07/24/2024 01:58:08 AM COMPARISON: 07/22/2024 CLINICAL HISTORY: SOB (shortness of breath) FINDINGS: LUNGS AND PLEURA: No focal pulmonary opacity. No pleural effusion. No pneumothorax. HEART AND MEDIASTINUM: No acute abnormality of the cardiac and mediastinal silhouettes. BONES AND SOFT TISSUES: No acute osseous abnormality. IMPRESSION: 1. No acute cardiopulmonary process to explain shortness of breath. Electronically signed by: Oneil Devonshire MD 07/24/2024 02:15 AM EST RP Workstation: [id number]   DG ABDOMEN PEG TUBE LOCATION Result Date: 07/24/2024 EXAM: 1 VIEW XRAY OF THE ABDOMEN 07/24/2024 01:58:08 AM  COMPARISON: None available. CLINICAL HISTORY: S/P percutaneous endoscopic gastrostomy (PEG) tube placement (HCC) FINDINGS: BOWEL: Nonobstructive bowel gas pattern. Contrast material opacifies the stomach and duodenal C-loop. SOFT TISSUES: Gastrostomy tube is in place overlying the mid body of the stomach. No opaque urinary calculi. BONES: No acute osseous abnormality. IMPRESSION: 1. Gastrostomy tube appropriately positioned within the stomach with contrast opacifying the stomach and duodenal C-loop. Electronically signed by: Oneil Devonshire MD 07/24/2024 02:13 AM EST RP Workstation: [id number]   DG Chest 2 View Result Date: 07/22/2024 EXAM: 2 VIEW(S) XRAY OF THE CHEST 07/22/2024 08:34:16 PM COMPARISON: 07/03/2024 CLINICAL HISTORY: productive cough, eval infiltrate FINDINGS: LUNGS AND PLEURA: No focal pulmonary opacity. No pleural effusion. No pneumothorax. HEART AND MEDIASTINUM: No acute abnormality of the cardiac and mediastinal silhouettes. BONES AND SOFT TISSUES: No acute osseous abnormality. IMPRESSION: 1. No acute cardiopulmonary process. Electronically signed by: Elsie Gravely MD 07/22/2024 08:39 PM EST RP Workstation: [id number]   CT ABDOMEN PELVIS W CONTRAST Result Date: 07/07/2024 CLINICAL DATA:  Abdominal pain status post gastrostomy placement EXAM: CT ABDOMEN AND PELVIS WITH CONTRAST TECHNIQUE: Multidetector CT imaging of the abdomen and pelvis was performed using the standard protocol following bolus administration of intravenous contrast. RADIATION DOSE REDUCTION: This exam was performed according to the departmental dose-optimization program which includes automated exposure control, adjustment of the mA and/or kV according to patient size and/or use of iterative reconstruction technique. CONTRAST:  OMNIPAQUE  IOHEXOL  300 MG/ML  SOLN COMPARISON:  CT abdomen and pelvis dated 06/18/2024 FINDINGS: Lower chest: No focal consolidation or pulmonary nodule in the lung bases. No pleural effusion or  pneumothorax demonstrated. Partially imaged heart size is normal. Hepatobiliary: No focal hepatic lesions. No intra or extrahepatic biliary ductal dilation. Normal gallbladder. Pancreas: No focal lesions or main ductal dilation. Spleen: Normal in size without focal abnormality. Adrenals/Urinary Tract: No adrenal nodules. No suspicious renal mass, calculi or hydronephrosis. No focal bladder wall thickening. Stomach/Bowel: Percutaneous gastrostomy tube in-situ. T tacks remain, tenting the anterior gastric wall. No evidence of bowel wall thickening, distention, or inflammatory changes. Normal appendix. Vascular/Lymphatic: No significant vascular findings are present. No enlarged abdominal or pelvic lymph nodes. Reproductive: No adnexal masses. Other: Punctate foci of free air along the anterior midline peritoneum. No free fluid or fluid collection. Musculoskeletal: No acute or abnormal lytic or blastic osseous lesions. IMPRESSION: 1. Percutaneous gastrostomy tube in-situ. 2. Punctate foci of free air along the anterior midline peritoneum,  likely related to recent gastrostomy tube placement. Electronically Signed   By: Limin  Xu M.D.   On: 07/07/2024 15:48   IR GASTROSTOMY TUBE MOD SED Result Date: 07/05/2024 INDICATION: 28 year old female with nasopharyngeal carcinoma currently on therapy but struggling with dysphagia. She presents for placement of a percutaneous gastrostomy tube for supplemental nutrition. EXAM: Fluoroscopically guided placement of percutaneous push in balloon retention gastrostomy tube Interventional Radiologist:  Wilkie LOIS Lent, MD MEDICATIONS: 1 mg glucagon  and 4 mg Zofran  administered intravenously by the Radiology nurse ANESTHESIA/SEDATION: Versed  5 mg IV; Fentanyl  100 mcg IV and 1 mg Dilaudid  administered intravenously by the Radiology nurse Moderate Sedation Time:  25 minutes The patient's vital signs and level of consciousness were continuously monitored during the procedure by the  interventional radiology nurse under my direct supervision. CONTRAST:  10mL OMNIPAQUE  IOHEXOL  300 MG/ML SOLN FLUOROSCOPY: Radiation exposure index: 26.3 mGy, air kerma COMPLICATIONS: None immediate. PROCEDURE: Informed written consent was obtained from the patient after a thorough discussion of the procedural risks, benefits and alternatives. All questions were addressed. Maximal Sterile Barrier Technique was utilized including caps, mask, sterile gowns, sterile gloves, sterile drape, hand hygiene and skin antiseptic. A timeout was performed prior to the initiation of the procedure. Maximal barrier sterile technique utilized including caps, mask, sterile gowns, sterile gloves, large sterile drape, hand hygiene, and chlorhexadine skin prep. An angled catheter was advanced over a wire under fluoroscopic guidance through the nose, down the esophagus and into the body of the stomach. The stomach was then insufflated with several 100 ml of air. Fluoroscopy confirmed location of the gastric bubble, as well as inferior displacement of the barium stained colon. Under direct fluoroscopic guidance, two T-tacks were placed, and the anterior gastric wall drawn up against the anterior abdominal wall. Percutaneous access was then obtained into the mid gastric body with an 18 gauge trocar needle. Aspiration of air, and injection of contrast material under fluoroscopy confirmed needle placement. An Amplatz wire was advanced in the gastric body and the access needle was removed. A 10 x 100 mm Athletis balloon was then advanced coaxially through a 18 French balloon retention percutaneous gastrostomy tube. The balloon/gastrostomy tube assembly was then advanced over the wire. The balloon was advanced through the anterior abdominal wall along the percutaneous tract. The balloon was then inflated to full effacement dilating the tract. As the balloon was deflated, the assembly was advanced over the wire and into the stomach carrying the  gastrostomy tube into the stomach. The retention balloon was filled with 10 mL saline. Contrast was injected through the tube confirming its location within the stomach. The tube was then secured with the external bumper and capped. The patient will be observed for several hours with the newly placed tube on low wall suction to evaluate for any post procedure complication. The patient tolerated the procedure well, there is no immediate complication. IMPRESSION: Successful placement of an 66 French balloon retention gastrostomy tube. Electronically Signed   By: Wilkie Lent M.D.   On: 07/05/2024 14:29   DG Chest 1 View Result Date: 07/03/2024 EXAM: 1 VIEW(S) XRAY OF THE CHEST 07/03/2024 05:10:42 AM COMPARISON: PA and lateral radiographs of the chest dated 10/31/2012. CLINICAL HISTORY: worsening weakness FINDINGS: LINES, TUBES AND DEVICES: Monitor wires noted. LUNGS AND PLEURA: No focal pulmonary opacity. No pulmonary edema. No pleural effusion. No pneumothorax. HEART AND MEDIASTINUM: No acute abnormality of the cardiac and mediastinal silhouettes. BONES AND SOFT TISSUES: No acute osseous abnormality. IMPRESSION: 1. No acute process.  Electronically signed by: Evalene Coho MD 07/03/2024 05:20 AM EST RP Workstation: [id number]    ASSESSMENT: Stage III EBV positive nasopharyngeal carcinoma.  PLAN:    Stage III EBV positive nasopharyngeal carcinoma: PET scan results from May 26, 2024 reviewed independently confirming stage of disease.  Patient will benefit from concurrent chemotherapy using weekly cisplatin  along with daily XRT.  We discussed the possibility of port placement, but patient previously declined.  Proceed with cycle 6 of weekly cisplatin  today.  Continue daily XRT completing on August 10, 2024.  Return to clinic in 1 week further evaluation and consideration of cycle 7.   Poor appetite/dysphagia: Patient now has PEG tube in place.  Appreciate dietary input.  Continue tube feeds as  prescribed.   Anxiety/insomnia: Continue Xanax  as prescribed.  Patient has been instructed to take 0.5 mg nightly. Nausea: Improved.  Continue Zofran  and Compazine  as prescribed.  Patient also received IV antiemetics in clinic today. Constipation: Patient does not complain of this today.  Continue stool softeners. Back pain: Patient does not complain of this today.  Continue Flexeril  as needed. Leukopenia: Chronic and unchanged.  Patient's total white blood cell count is 3.9. Anemia: Chronic and unchanged.  Patient's hemoglobin is 10.2. Thrombocytosis: Likely reactive, monitor. Dysphagia: Continue Magic mouthwash as prescribed.  PEG tube as above.   Patient expressed understanding and was in agreement with this plan. She also understands that She can call clinic at any time with any questions, concerns, or complaints.    Cancer Staging  Nasopharyngeal carcinoma [facility]) Staging form: Pharynx - Nasopharynx, AJCC V9 - Clinical stage from 05/30/2024: Stage III (cTX, cN3, cM0) - Signed by [name], MD on 05/30/2024 Stage prefix: Initial diagnosis Method of lymph node assessment: Clinical   Evalene PARAS Jacobo, MD   07/27/2024 7:34 AM

## 2024-07-26 NOTE — Progress Notes (Unsigned)
 Patient is not able to sleep. Not currently taking anything to help her sleep. She did say that the mucus isn't as bad, but it is still bothering her.

## 2024-07-26 NOTE — Progress Notes (Signed)
 Ok to tx with HR 102 per Dr Jacobo

## 2024-07-26 NOTE — Progress Notes (Signed)
 Nutrition Follow-up:  Patient with stage III EBV positive nasopharyngeal carcinoma.  Patient receiving cisplatin  and radiation.  Followed by Dr Jacobo.  PEG (18 Fr) placed on 11/5, noted ED visit for cough, throat swelling, g tube concerns.    Met with patient during infusion.  Reports that she is taking 3 cartons of formula per day via feeding tube.  Says that he is not hungry so does not take anymore tube feeding.  Also says that she is afraid she will get to full.  Later during the visit tells RD that her stomach is getting use to formula and thinks she can do more.  Drinking gingerale and gatorade by mouth.  Says that she is having diarrhea 3-4 times a day. She is still taking lactulose .    Medications: reviewed  Labs: glucose 112, BUN 13, creatinine 0.84, Na 135  Anthropometrics:   Weight 191 lb 3.2 oz 195 lb on 11/26 197 lb on 11/13 207 lb on 11/9 204 lb 2 oz on 10/29 210 lb on 10/22 219 lb on 10/19 216 lb on 8/21 235 lb on 02/29/24   Estimated Energy Needs  Kcals: 2000-2300 Protein: 95-114 g Fluid: > 2 L  NUTRITION DIAGNOSIS: Inadequate oral intake continues   MALNUTRITION DIAGNOSIS: Severe malnutrition   INTERVENTION:  Discussed with patient that she can give carton of formula with 60 ml water  flush before and 120 ml water  flush after in early AM if feeling hungry and then q 3-4 hours after that for total of 6 cartons per day to better meet nutritional needs.  Written instructions given to patient with water  flush and times for feeding. Patient says that she is going to try and increase nutrition. Provides 2130 calories, 89 g protein, 2166 ml free water .   Asked patient to stop lactulose  with 3-4 watery stools per day.  Instructed patient to restart if starts having issues with constipation.   [place] [employer] will not cover ensure clear shakes for patient. Coupons given Continue oral intake as able    MONITORING, EVALUATION, GOAL: weight trends, tube feeding   NEXT  VISIT: Thursday, Dec 4 during infusion  Joseantonio Dittmar B. Dasie SOLON, CSO, LDN Registered Dietitian [phone]

## 2024-07-27 ENCOUNTER — Other Ambulatory Visit: Payer: Self-pay

## 2024-07-27 ENCOUNTER — Emergency Department
Admission: EM | Admit: 2024-07-27 | Discharge: 2024-07-27 | Disposition: A | Discharge status: Home / Self Care | Attending: Emergency Medicine | Admitting: Emergency Medicine

## 2024-07-27 ENCOUNTER — Encounter: Payer: Self-pay | Admitting: Oncology

## 2024-07-27 DIAGNOSIS — R112 Nausea with vomiting, unspecified: Secondary | ICD-10-CM | POA: Diagnosis present

## 2024-07-27 DIAGNOSIS — R531 Weakness: Secondary | ICD-10-CM | POA: Diagnosis not present

## 2024-07-27 DIAGNOSIS — C119 Malignant neoplasm of nasopharynx, unspecified: Secondary | ICD-10-CM

## 2024-07-27 LAB — COMPREHENSIVE METABOLIC PANEL WITH GFR
ALT: 139 U/L — ABNORMAL HIGH (ref 0–44)
AST: 45 U/L — ABNORMAL HIGH (ref 15–41)
Albumin: 4.4 g/dL (ref 3.5–5.0)
Alkaline Phosphatase: 74 U/L (ref 38–126)
Anion gap: 13 (ref 5–15)
BUN: 13 mg/dL (ref 6–20)
CO2: 25 mmol/L (ref 22–32)
Calcium: 10 mg/dL (ref 8.9–10.3)
Chloride: 97 mmol/L — ABNORMAL LOW (ref 98–111)
Creatinine, Ser: 0.72 mg/dL (ref 0.44–1.00)
GFR, Estimated: >60 mL/min (ref >60)
Glucose, Bld: 120 mg/dL — ABNORMAL HIGH (ref 70–99)
Potassium: 3.7 mmol/L (ref 3.5–5.1)
Sodium: 134 mmol/L — ABNORMAL LOW (ref 135–145)
Total Bilirubin: 0.4 mg/dL (ref 0.0–1.2)
Total Protein: 7.4 g/dL (ref 6.5–8.1)

## 2024-07-27 LAB — CULTURE, BLOOD (SINGLE)
Culture: NO GROWTH
Special Requests: ADEQUATE

## 2024-07-27 LAB — CBC WITH DIFFERENTIAL/PLATELET
Abs Immature Granulocytes: 0.03 K/uL (ref 0.00–0.07)
Basophils Absolute: 0 K/uL (ref 0.0–0.1)
Basophils Relative: 0 %
Eosinophils Absolute: 0 K/uL (ref 0.0–0.5)
Eosinophils Relative: 0 %
HCT: 30.6 % — ABNORMAL LOW (ref 36.0–46.0)
Hemoglobin: 10.4 g/dL — ABNORMAL LOW (ref 12.0–15.0)
Immature Granulocytes: 1 %
Lymphocytes Relative: 3 %
Lymphs Abs: 0.2 K/uL — ABNORMAL LOW (ref 0.7–4.0)
MCH: 30.8 pg (ref 26.0–34.0)
MCHC: 34 g/dL (ref 30.0–36.0)
MCV: 90.5 fL (ref 80.0–100.0)
Monocytes Absolute: 0.4 K/uL (ref 0.1–1.0)
Monocytes Relative: 6 %
Neutro Abs: 5.7 K/uL (ref 1.7–7.7)
Neutrophils Relative %: 90 %
Platelets: 493 K/uL — ABNORMAL HIGH (ref 150–400)
RBC: 3.38 MIL/uL — ABNORMAL LOW (ref 3.87–5.11)
RDW: 16.8 % — ABNORMAL HIGH (ref 11.5–15.5)
WBC: 6.2 K/uL (ref 4.0–10.5)
nRBC: 0 % (ref 0.0–0.2)

## 2024-07-27 LAB — LIPASE, BLOOD: Lipase: 20 U/L (ref 11–51)

## 2024-07-27 MED ORDER — HYDROMORPHONE HCL 1 MG/ML IJ SOLN
0.5000 mg | Freq: Once | INTRAMUSCULAR | Status: AC
  Administered 2024-07-27: 0.5 mg via INTRAVENOUS
  Filled 2024-07-27: qty 0.5

## 2024-07-27 MED ORDER — SODIUM CHLORIDE 0.9 % IV BOLUS
1000.0000 mL | Freq: Once | INTRAVENOUS | Status: AC
  Administered 2024-07-27: 1000 mL via INTRAVENOUS

## 2024-07-27 MED ORDER — ONDANSETRON HCL 4 MG/2ML IJ SOLN
4.0000 mg | Freq: Once | INTRAMUSCULAR | Status: AC
  Administered 2024-07-27: 4 mg via INTRAVENOUS
  Filled 2024-07-27: qty 2

## 2024-07-27 MED ORDER — ONDANSETRON HCL 4 MG/2ML IJ SOLN
4.0000 mg | Freq: Once | INTRAMUSCULAR | Status: AC
Start: 2024-07-27 — End: 2024-07-27
  Administered 2024-07-27: 4 mg via INTRAVENOUS
  Filled 2024-07-27: qty 2

## 2024-07-27 MED ORDER — ONDANSETRON HCL 4 MG PO TABS: 8.0000 mg | ORAL_TABLET | Freq: Three times a day (TID) | ORAL | 0 refills | Status: DC | PRN

## 2024-07-27 NOTE — ED Provider Notes (Signed)
 [facility] Provider Note    Event Date/Time   First MD Initiated Contact with Patient 07/27/24 1600     (approximate)   History   Nausea   HPI  Brandi [name] is a 28 year old female with history of nasopharyngeal carcinoma on chemotherapy, PEG tube dependent presenting to the emergency department for evaluation of nausea and weakness.  Patient had her chemotherapy session yesterday.  Feels generally weak today with associated nausea and vomiting.  Reports she ran out of her Zofran  and the instructions on her medications today not to take Compazine  until 48 hours after her chemotherapy.  Tried taking a Xanax  but was unable to keep this down.  Reports she has felt similarly to this when she has received chemo in the past.  Denies significant abdominal pain, chest pain, shortness of breath, fevers.  Reviewed oncology visit from yesterday.  Continue plan for chemoradiation at that time.  Continued on tube feeds.  Plan for continued Xanax , Zofran , Compazine , Flexeril .       Physical Exam   Triage Vital Signs: ED Triage Vitals  Encounter Vitals Group     BP 07/27/24 1603 (!) 162/114     Girls Systolic BP Percentile --      Girls Diastolic BP Percentile --      Boys Systolic BP Percentile --      Boys Diastolic BP Percentile --      Pulse Rate 07/27/24 1602 (!) 110     Resp 07/27/24 1602 18     Temp 07/27/24 1602 99 F (37.2 C)     Temp Source 07/27/24 1602 Oral     SpO2 07/27/24 1602 100 %     Weight --      Height --      Head Circumference --      Peak Flow --      Pain Score 07/27/24 1603 8     Pain Loc --      Pain Education --      Exclude from Growth Chart --     Most recent vital signs: Vitals:   07/27/24 1602 07/27/24 1603  BP:  (!) 162/114  Pulse: (!) 110   Resp: 18   Temp: 99 F (37.2 C)   SpO2: 100%      General: Awake, interactive  CV:  Good peripheral perfusion Resp:  Unlabored respirations, lungs clear to  auscultation Abd:  Nondistended, soft, mild generalized tenderness palpation  Neuro:  Symmetric facial movement, fluid speech   ED Results / Procedures / Treatments   Labs (all labs ordered are listed, but only abnormal results are displayed) Labs Reviewed  CBC WITH DIFFERENTIAL/PLATELET - Abnormal; Notable for the following components:      Result Value   RBC 3.38 (*)    Hemoglobin 10.4 (*)    HCT 30.6 (*)    RDW 16.8 (*)    Platelets 493 (*)    Lymphs Abs 0.2 (*)    All other components within normal limits  COMPREHENSIVE METABOLIC PANEL WITH GFR - Abnormal; Notable for the following components:   Sodium 134 (*)    Chloride 97 (*)    Glucose, Bld 120 (*)    AST 45 (*)    ALT 139 (*)    All other components within normal limits  LIPASE, BLOOD     EKG EKG independently reviewed and interpreted by myself demonstrates:    RADIOLOGY Imaging independently reviewed and interpreted by myself demonstrates:   Formal  Radiology Read:  No results found.  PROCEDURES:  Critical Care performed: No  Procedures   MEDICATIONS ORDERED IN ED: Medications  sodium chloride  0.9 % bolus 1,000 mL (1,000 mLs Intravenous New Bag/Given 07/27/24 1642)  ondansetron  (ZOFRAN ) injection 4 mg (4 mg Intravenous Given 07/27/24 1642)  HYDROmorphone  (DILAUDID ) injection 0.5 mg (0.5 mg Intravenous Given 07/27/24 1641)  HYDROmorphone  (DILAUDID ) injection 0.5 mg (0.5 mg Intravenous Given 07/27/24 1815)  ondansetron  (ZOFRAN ) injection 4 mg (4 mg Intravenous Given 07/27/24 1913)     IMPRESSION / MDM / ASSESSMENT AND PLAN / ED COURSE  I reviewed the triage vital signs and the nursing notes.  Differential diagnosis includes, but is not limited to, nausea and vomiting associated with chemotherapy, anemia, electrolyte abnormality, lower suspicion significant acute intra-abdominal process given overall reassuring abdominal exam, history of similar episodes in the past  Patient's presentation is most  consistent with acute presentation with potential threat to life or bodily function.  28 year old female presenting with weakness and nausea and vomiting in the setting of recent chemotherapy.  Mild tachycardia on presentation.  Lab with normal white blood cell count, stable anemia.  CMP without critical derangements.  Normal lipase.  Will treat symptomatically with IV fluids, Zofran , Dilaudid .  Clinical Course as of 07/27/24 1919  Thu Jul 27, 2024  1759 Patient reassessed.  Reports mild ongoing generalized pain but reports that nausea has completely resolved.  Would like to trial tube feeding here to ensure that she is able to tolerate this.  Did bring materials for tube feeding.  Will reassess following this. [NR]  1915 Patient was able to tolerate tube feeds without recurrent vomiting.  She is comfortable with discharge home.  Will DC with prescription for Zofran .  Strict return precautions provided.  Patient discharged in stable condition. [NR]    Clinical Course User Index [NR] Levander Slate, MD     FINAL CLINICAL IMPRESSION(S) / ED DIAGNOSES   Final diagnoses:  Nausea and vomiting, unspecified vomiting type  Generalized weakness     Rx / DC Orders   ED Discharge Orders          Ordered    ondansetron  (ZOFRAN ) 4 MG tablet  Every 8 hours PRN        07/27/24 1919             Note:  This document was prepared using Dragon voice recognition software and may include unintentional dictation errors.   Levander Slate, MD 07/27/24 [phone]

## 2024-07-27 NOTE — ED Notes (Signed)
 PO challenged patient through g tube. About 5 minutes after feed patient started to complain of nausea and some mild abdominal pain. No episode of emesis at this time.

## 2024-07-27 NOTE — ED Triage Notes (Signed)
 Pt is currently receiving chemotherapy for cancer in her neck. Pt says she is out of her nausea medication. Pt tried to take a xanax , but threw it up. Pt also c/o generalized pain, weakness, and fatigue.

## 2024-07-27 NOTE — Discharge Instructions (Addendum)
 You were seen in the ER today for your nausea and vomiting.  I suspect this is likely related to your recent chemotherapy.  I am glad you are feeling better here.  I sent a prescription for Zofran  to your pharmacy.  Continue to follow-up with your oncology team as directed.  Return to the ER for new or worsening symptoms.

## 2024-07-28 ENCOUNTER — Other Ambulatory Visit: Payer: Self-pay

## 2024-07-28 ENCOUNTER — Observation Stay
Admission: EM | Admit: 2024-07-28 | Discharge: 2024-07-29 | Disposition: A | Attending: Emergency Medicine | Admitting: Emergency Medicine

## 2024-07-28 ENCOUNTER — Telehealth: Payer: Self-pay | Admitting: *Deleted

## 2024-07-28 ENCOUNTER — Encounter: Payer: Self-pay | Admitting: *Deleted

## 2024-07-28 DIAGNOSIS — E871 Hypo-osmolality and hyponatremia: Secondary | ICD-10-CM | POA: Diagnosis not present

## 2024-07-28 DIAGNOSIS — I10 Essential (primary) hypertension: Secondary | ICD-10-CM | POA: Diagnosis not present

## 2024-07-28 DIAGNOSIS — E876 Hypokalemia: Secondary | ICD-10-CM | POA: Insufficient documentation

## 2024-07-28 DIAGNOSIS — J45909 Unspecified asthma, uncomplicated: Secondary | ICD-10-CM | POA: Diagnosis not present

## 2024-07-28 DIAGNOSIS — R112 Nausea with vomiting, unspecified: Principal | ICD-10-CM | POA: Insufficient documentation

## 2024-07-28 DIAGNOSIS — F1290 Cannabis use, unspecified, uncomplicated: Secondary | ICD-10-CM | POA: Insufficient documentation

## 2024-07-28 DIAGNOSIS — C113 Malignant neoplasm of anterior wall of nasopharynx: Secondary | ICD-10-CM | POA: Diagnosis not present

## 2024-07-28 DIAGNOSIS — D75839 Thrombocytosis, unspecified: Secondary | ICD-10-CM | POA: Diagnosis not present

## 2024-07-28 DIAGNOSIS — Z79899 Other long term (current) drug therapy: Secondary | ICD-10-CM | POA: Insufficient documentation

## 2024-07-28 DIAGNOSIS — D61818 Other pancytopenia: Secondary | ICD-10-CM | POA: Insufficient documentation

## 2024-07-28 DIAGNOSIS — E860 Dehydration: Secondary | ICD-10-CM

## 2024-07-28 LAB — COMPREHENSIVE METABOLIC PANEL WITH GFR
ALT: 145 U/L — ABNORMAL HIGH (ref 0–44)
AST: 51 U/L — ABNORMAL HIGH (ref 15–41)
Albumin: 4 g/dL (ref 3.5–5.0)
Alkaline Phosphatase: 65 U/L (ref 38–126)
Anion gap: 12 (ref 5–15)
BUN: 12 mg/dL (ref 6–20)
CO2: 25 mmol/L (ref 22–32)
Calcium: 9.2 mg/dL (ref 8.9–10.3)
Chloride: 98 mmol/L (ref 98–111)
Creatinine, Ser: 0.7 mg/dL (ref 0.44–1.00)
GFR, Estimated: >60 mL/min (ref >60)
Glucose, Bld: 123 mg/dL — ABNORMAL HIGH (ref 70–99)
Potassium: 3.2 mmol/L — ABNORMAL LOW (ref 3.5–5.1)
Sodium: 135 mmol/L (ref 135–145)
Total Bilirubin: 0.3 mg/dL (ref 0.0–1.2)
Total Protein: 6.6 g/dL (ref 6.5–8.1)

## 2024-07-28 LAB — TROPONIN T, HIGH SENSITIVITY: Troponin T High Sensitivity: <15 ng/L (ref 0–19)

## 2024-07-28 LAB — CBC WITH DIFFERENTIAL/PLATELET
Abs Immature Granulocytes: 0.03 K/uL (ref 0.00–0.07)
Basophils Absolute: 0 K/uL (ref 0.0–0.1)
Basophils Relative: 0 %
Eosinophils Absolute: 0 K/uL (ref 0.0–0.5)
Eosinophils Relative: 0 %
HCT: 28.5 % — ABNORMAL LOW (ref 36.0–46.0)
Hemoglobin: 9.6 g/dL — ABNORMAL LOW (ref 12.0–15.0)
Immature Granulocytes: 1 %
Lymphocytes Relative: 3 %
Lymphs Abs: 0.2 K/uL — ABNORMAL LOW (ref 0.7–4.0)
MCH: 31 pg (ref 26.0–34.0)
MCHC: 33.7 g/dL (ref 30.0–36.0)
MCV: 91.9 fL (ref 80.0–100.0)
Monocytes Absolute: 0.5 K/uL (ref 0.1–1.0)
Monocytes Relative: 9 %
Neutro Abs: 4.5 K/uL (ref 1.7–7.7)
Neutrophils Relative %: 87 %
Platelets: 425 K/uL — ABNORMAL HIGH (ref 150–400)
RBC: 3.1 MIL/uL — ABNORMAL LOW (ref 3.87–5.11)
RDW: 17.1 % — ABNORMAL HIGH (ref 11.5–15.5)
WBC: 5.2 K/uL (ref 4.0–10.5)
nRBC: 0 % (ref 0.0–0.2)

## 2024-07-28 LAB — HCG, QUANTITATIVE, PREGNANCY: hCG, Beta Chain, Quant, S: <1 m[IU]/mL (ref <5)

## 2024-07-28 LAB — MAGNESIUM: Magnesium: 1.6 mg/dL — ABNORMAL LOW (ref 1.7–2.4)

## 2024-07-28 LAB — LIPASE, BLOOD: Lipase: 18 U/L (ref 11–51)

## 2024-07-28 MED ORDER — ALPRAZOLAM 0.25 MG PO TABS
0.2500 mg | ORAL_TABLET | Freq: Every evening | ORAL | Status: DC | PRN
Start: 2024-07-28 — End: 2024-07-29

## 2024-07-28 MED ORDER — ACETAMINOPHEN 325 MG PO TABS: 650.0000 mg | ORAL_TABLET | Freq: Four times a day (QID) | ORAL | Status: DC | PRN

## 2024-07-28 MED ORDER — DROSPIRENONE 4 MG PO TABS: 1.0000 | ORAL_TABLET | Freq: Every day | ORAL | Status: DC

## 2024-07-28 MED ORDER — SODIUM CHLORIDE 0.9 % IV SOLN
12.5000 mg | Freq: Four times a day (QID) | INTRAVENOUS | Status: DC | PRN
  Administered 2024-07-28: 12.5 mg via INTRAVENOUS
  Filled 2024-07-28: qty 12.5

## 2024-07-28 MED ORDER — TRAZODONE HCL 50 MG PO TABS: 25.0000 mg | ORAL_TABLET | Freq: Every evening | ORAL | Status: DC | PRN

## 2024-07-28 MED ORDER — ENOXAPARIN SODIUM 40 MG/0.4ML IJ SOSY
40.0000 mg | PREFILLED_SYRINGE | INTRAMUSCULAR | Status: DC
  Administered 2024-07-29: 40 mg via SUBCUTANEOUS
  Filled 2024-07-28: qty 0.4

## 2024-07-28 MED ORDER — METOCLOPRAMIDE HCL 5 MG/ML IJ SOLN
10.0000 mg | Freq: Four times a day (QID) | INTRAMUSCULAR | Status: DC | PRN
  Administered 2024-07-28 (×2): 10 mg via INTRAVENOUS
  Filled 2024-07-28 (×2): qty 2

## 2024-07-28 MED ORDER — SODIUM CHLORIDE 0.9 % IV SOLN: INTRAVENOUS | Status: DC

## 2024-07-28 MED ORDER — OMEPRAZOLE 2 MG/ML ORAL SUSPENSION
40.0000 mg | Freq: Every day | ORAL | Status: DC
  Filled 2024-07-28 (×5): qty 20

## 2024-07-28 MED ORDER — ONDANSETRON HCL 4 MG/2ML IJ SOLN: 4.0000 mg | Freq: Four times a day (QID) | INTRAMUSCULAR | Status: DC | PRN

## 2024-07-28 MED ORDER — AMLODIPINE BESYLATE 5 MG PO TABS
5.0000 mg | ORAL_TABLET | Freq: Every day | ORAL | Status: DC
  Administered 2024-07-28 – 2024-07-29 (×2): 5 mg
  Filled 2024-07-28 (×2): qty 1

## 2024-07-28 MED ORDER — FREE WATER
60.0000 mL | Freq: Every day | Status: DC
  Administered 2024-07-28 – 2024-07-29 (×7): 60 mL
  Filled 2024-07-28 (×3): qty 60

## 2024-07-28 MED ORDER — PANTOPRAZOLE SODIUM 40 MG IV SOLR
80.0000 mg | Freq: Once | INTRAVENOUS | Status: AC
  Administered 2024-07-28: 80 mg via INTRAVENOUS
  Filled 2024-07-28: qty 20

## 2024-07-28 MED ORDER — POTASSIUM CHLORIDE 20 MEQ PO PACK
20.0000 meq | PACK | Freq: Every day | ORAL | Status: DC
  Administered 2024-07-28 – 2024-07-29 (×2): 20 meq
  Filled 2024-07-28 (×2): qty 1

## 2024-07-28 MED ORDER — OSMOLITE 1.5 CAL PO LIQD
1000.0000 mL | Freq: Four times a day (QID) | ORAL | Status: DC
  Administered 2024-07-28 – 2024-07-29 (×3): 1000 mL

## 2024-07-28 MED ORDER — LACTULOSE 10 GM/15ML PO SOLN: 30.0000 g | Freq: Every day | ORAL | Status: DC | PRN

## 2024-07-28 MED ORDER — ACETAMINOPHEN 650 MG RE SUPP: 650.0000 mg | Freq: Four times a day (QID) | RECTAL | Status: DC | PRN

## 2024-07-28 MED ORDER — HYDROMORPHONE HCL 1 MG/ML IJ SOLN
1.0000 mg | Freq: Once | INTRAMUSCULAR | Status: AC
  Administered 2024-07-28: 1 mg via INTRAVENOUS
  Filled 2024-07-28: qty 1

## 2024-07-28 MED ORDER — ONDANSETRON HCL 4 MG/2ML IJ SOLN
4.0000 mg | Freq: Once | INTRAMUSCULAR | Status: AC
  Administered 2024-07-28: 4 mg via INTRAVENOUS
  Filled 2024-07-28: qty 2

## 2024-07-28 MED ORDER — SODIUM CHLORIDE 0.9 % IV BOLUS (SEPSIS)
2000.0000 mL | Freq: Once | INTRAVENOUS | Status: AC
  Administered 2024-07-28: 2000 mL via INTRAVENOUS

## 2024-07-28 MED ORDER — HYDROMORPHONE HCL 1 MG/ML IJ SOLN
1.0000 mg | INTRAMUSCULAR | Status: DC | PRN
  Administered 2024-07-28 – 2024-07-29 (×5): 1 mg via INTRAVENOUS
  Filled 2024-07-28 (×5): qty 1

## 2024-07-28 MED ORDER — ALBUTEROL SULFATE (2.5 MG/3ML) 0.083% IN NEBU: 2.5000 mg | INHALATION_SOLUTION | RESPIRATORY_TRACT | Status: DC | PRN

## 2024-07-28 MED ORDER — FLUCONAZOLE 100 MG PO TABS: 200.0000 mg | ORAL_TABLET | Freq: Every day | ORAL | Status: DC

## 2024-07-28 MED ORDER — CYCLOBENZAPRINE HCL 10 MG PO TABS
10.0000 mg | ORAL_TABLET | Freq: Three times a day (TID) | ORAL | Status: DC | PRN
Start: 2024-07-28 — End: 2024-07-29

## 2024-07-28 MED ORDER — POTASSIUM CHLORIDE 10 MEQ/100ML IV SOLN
10.0000 meq | INTRAVENOUS | Status: AC
  Administered 2024-07-28 (×2): 10 meq via INTRAVENOUS
  Filled 2024-07-28 (×2): qty 100

## 2024-07-28 NOTE — TOC CM/SW Note (Signed)
 Transition of Care [facility]) - Inpatient Brief Assessment   Patient Details  Name: Brandi [name] MRN: [id number] Date of Birth: 1996/03/27  [facility]) CM/SW Contact:    Daved JONETTA Hamilton, RN Phone Number: 07/28/2024, 1:43 PM   Clinical Narrative:   Transition of Care (TOC) Screening Note   Patient Details  Name: Brandi [name] Date of Birth: 06-28-1996   Transition of Care [facility]) CM/SW Contact:    Daved JONETTA Hamilton, RN Phone Number: 07/28/2024, 1:43 PM    Transition of Care Department [facility]) has reviewed patient and no TOC needs have been identified at this time. If new patient transition needs arise, please place a TOC consult.    Transition of Care Asessment: Insurance and Status: Insurance coverage has been reviewed Patient has primary care physician: Yes   Prior level of function:: Independent Prior/Current Home Services: No current home services Social Drivers of Health Review: SDOH reviewed no interventions necessary Readmission risk has been reviewed: No (patient in observation status, no score generated) Transition of care needs: no transition of care needs at this time

## 2024-07-28 NOTE — ED Notes (Signed)
 Called to inform floor of patient's soon arrival

## 2024-07-28 NOTE — Patient Outreach (Signed)
 Complex Care Management   Visit Note  07/28/2024  Name:  Brandi [name] MRN: [id number] DOB: 06-23-1996  Situation: RNCM chart review for initial however pt hospitalized today. Will refer back to caregiver for rescheduling.   Olam Ku, RN, BSN [place]  [facility], [name] RN Care Manager Direct Dial: [phone]  Fax: [phone]

## 2024-07-28 NOTE — Plan of Care (Signed)

## 2024-07-28 NOTE — ED Notes (Signed)
 Called CCMD for transfer of central monitoring at this time

## 2024-07-28 NOTE — H&P (Signed)
 History and Physical  [name] FMW:[id number] DOB: [birth year] DOA: 07/28/2024  PCP: [name], MD   Chief Complaint: Nausea, vomiting  HPI: Brandi [name] is a 28 y.o. 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  She is completely PEG tube dependent.  She received her last chemotherapy on 11/26 and the following day started having some crampy abdominal discomfort, nausea and vomiting.  She is also been having some diarrhea.  She denies any fevers, chills, hematemesis or any other complications.  She tells me that she has had symptoms like this nausea and vomiting with chemotherapy in the past, but not every time.  Review of Systems: Please see HPI for pertinent positives and negatives. A complete 10 system review of systems are otherwise negative.  Past Medical History:  Diagnosis Date   Asthma    Cancer (HCC)    head and neck, getting radiation   GERD (gastroesophageal reflux disease)    HTN (hypertension)    Past Surgical History:  Procedure Laterality Date   dental procedure     IR GASTROSTOMY TUBE MOD SED  07/05/2024   IR US  LIVER BIOPSY  05/17/2024   Social History:  reports that she has quit smoking. Her smoking use included e-cigarettes. She has never used smokeless tobacco. She reports that she does not currently use alcohol. She reports current drug use. Drug: Marijuana.  Allergies  Allergen Reactions   Shellfish Allergy Anaphylaxis   Tomato Other (See Comments)    Flares up exema    Family History  Problem Relation Age of Onset   Hypertension Maternal Grandmother    Diabetes Maternal Grandmother      Prior to Admission medications   Medication Sig Start Date End Date Taking? Authorizing Provider  ALPRAZolam  (XANAX ) 0.25 MG tablet Take 1 tablet (0.25 mg total) by mouth at bedtime as  needed for anxiety. 06/15/24  Yes [name], MD  amLODipine  (NORVASC ) 5 MG tablet Place 1 tablet (5 mg total) into feeding tube daily. 07/09/24  Yes Wieting, Richard, MD  cyclobenzaprine  (FLEXERIL ) 10 MG tablet Take 1 tablet (10 mg total) by mouth 3 (three) times daily as needed for muscle spasms. 06/14/24  Yes [name], MD  fluconazole  (DIFLUCAN ) 200 MG tablet Take 1 tablet (200 mg total) by mouth daily. 07/19/24  Yes Chrystal, Marcey, MD  lactulose  (CHRONULAC ) 10 GM/15ML solution Take 45 mLs (30 g total) by mouth daily as needed for severe constipation. 07/09/24  Yes Wieting, Richard, MD  ondansetron  (ZOFRAN ) 4 MG tablet Place 2 tablets (8 mg total) into feeding tube every 8 (eight) hours as needed for nausea or vomiting. 07/27/24  Yes Levander Slate, MD  pantoprazole  sodium (PROTONIX ) 40 mg Place 40 mg into feeding tube daily for 14 days. 07/24/24 08/07/24 Yes Ernest Ronal BRAVO, MD  phenol (CHLORASEPTIC) 1.4 % LIQD Use as directed 1 spray in the mouth or throat as needed for throat irritation / pain. 06/27/24  Yes Awanda City, MD  potassium chloride  20 MEQ/15ML (10%) SOLN Take 15 mLs (20 mEq total) by mouth daily. 07/17/24  Yes Borders, Fonda SAUNDERS, NP  prochlorperazine  (COMPAZINE ) 10 MG tablet Take 1 tablet (10 mg total) by mouth every 8 (eight) hours as needed for nausea or vomiting. 07/17/24  Yes Borders, Fonda SAUNDERS, NP  SLYND  4 MG TABS Take 1 tablet by mouth daily. 05/20/24  Yes [provider]  Water  For Irrigation, Sterile (FREE WATER ) SOLN Place 60 mLs into feeding tube 6 (six) times daily. 07/09/24  Yes Wieting, Richard, MD  chlorpheniramine-HYDROcodone  (TUSSIONEX) 10-8 MG/5ML Take 5 mLs by mouth every 12 (twelve) hours as needed for cough. Patient not taking: Reported on 07/28/2024 07/22/24   Claudene Rover, MD  lidocaine  (XYLOCAINE ) 2 % solution Use as directed 15 mLs in the mouth or throat every 3 (three) hours as needed for mouth pain. swish and swallow. Patient not taking:  Reported on 07/28/2024 06/27/24   Awanda City, MD  Multiple Vitamin (MULTIVITAMIN WITH MINERALS) TABS tablet Place 1 tablet into feeding tube daily. Patient not taking: Reported on 07/28/2024 07/09/24   Josette Ade, MD  Nutritional Supplements (ENSURE CLEAR) LIQD Take 1 Bottle by mouth 2 (two) times daily. 07/13/24   [name], MD  Nutritional Supplements (FEEDING SUPPLEMENT, OSMOLITE 1.5 CAL,) LIQD Place 119 mLs into feeding tube 6 (six) times daily. 07/09/24   Josette Ade, MD  nystatin  (MYCOSTATIN ) 100000 UNIT/ML suspension Take 5 mLs (500,000 Units total) by mouth 4 (four) times daily. Patient not taking: Reported on 07/28/2024 07/17/24   Borders, Fonda SAUNDERS, NP  ondansetron  (ZOFRAN ) 8 MG tablet Place 1 tablet (8 mg total) into feeding tube every 8 (eight) hours as needed for nausea or vomiting. Start on the third day after cisplatin . Patient not taking: Reported on 07/28/2024 07/09/24   Josette Ade, MD  prednisoLONE  (PRELONE ) 15 MG/5ML SOLN Take 16.7 mLs (50 mg total) by mouth daily before breakfast for 4 days. Patient not taking: Reported on 07/28/2024 07/24/24 07/28/24  Ernest Ronal BRAVO, MD  senna-docusate (SENOKOT-S) 8.6-50 MG tablet Take 1 tablet by mouth 2 (two) times daily. Patient not taking: Reported on 07/28/2024 05/19/24   Fausto Sor A, DO  thiamine  (VITAMIN B-1) 100 MG tablet Place 1 tablet (100 mg total) into feeding tube daily. Patient not taking: Reported on 07/28/2024 07/09/24   Josette Ade, MD    Physical Exam: BP (!) 177/128 (BP Location: Right Arm)   Pulse 89   Temp 98.4 F (36.9 C) (Oral)   Resp 18   SpO2 100%  General: Well-nourished well-developed female appearing her stated age, alert, oriented, calm, tired and chronically ill-appearing, but breathing comfortably on room air and not in any acute distress Cardiovascular: RRR, no murmurs or rubs, no peripheral edema  Respiratory: clear to auscultation bilaterally, no wheezes, no crackles   Abdomen: soft, nontender, slightly distended, PEG tube in place Skin: dry, no rashes  Musculoskeletal: no joint effusions, normal range of motion  Psychiatric: appropriate affect, normal speech  Neurologic: extraocular muscles intact, clear speech, moving all extremities with intact sensorium         Labs on Admission:  Basic Metabolic Panel: Recent Labs  Lab 07/22/24 2007 07/24/24 0035 07/26/24 0910 07/27/24 1622 07/28/24 0533  NA 138 134* 135 134* 135  K 3.6 3.8 3.6 3.7 3.2*  CL 101 101 101 97* 98  CO2 24 20* 25 25 25   GLUCOSE 94 101* 112* 120* 123*  BUN 16 15 13 13 12   CREATININE 0.89 0.80 0.84 0.72 0.70  CALCIUM 9.7 9.3 9.3 10.0 9.2  MG 2.0  --  1.8  --   --    Liver Function Tests: Recent Labs  Lab 07/22/24 2007 07/24/24 0035 07/27/24 1622 07/28/24 0533  AST 25 36 45* 51*  ALT 61* 73* 139* 145*  ALKPHOS 69 65 74 65  BILITOT 0.5 0.4 0.4 0.3  PROT 7.1 6.9  7.4 6.6  ALBUMIN 4.3 4.2 4.4 4.0   Recent Labs  Lab 07/24/24 0035 07/27/24 1622 07/28/24 0533  LIPASE 21 20 18    No results for input(s): AMMONIA in the last 168 hours. CBC: Recent Labs  Lab 07/22/24 2011 07/24/24 0035 07/26/24 0910 07/27/24 1622 07/28/24 0533  WBC 3.1* 3.9* 3.9* 6.2 5.2  NEUTROABS 2.4 3.2 3.2 5.7 4.5  HGB 9.8* 9.6* 10.2* 10.4* 9.6*  HCT 29.7* 27.9* 30.2* 30.6* 28.5*  MCV 93.4 90.0 90.4 90.5 91.9  PLT 473* 444* 491* 493* 425*   Cardiac Enzymes: No results for input(s): CKTOTAL, CKMB, CKMBINDEX, TROPONINI in the last 168 hours. BNP (last 3 results) No results for input(s): BNP in the last 8760 hours.  ProBNP (last 3 results) No results for input(s): PROBNP in the last 8760 hours.  CBG: No results for input(s): GLUCAP in the last 168 hours.  Radiological Exams on Admission: No results found.  Assessment/Plan Brandi [name] is a 28 y.o. female with medical history significant for stage III nasopharyngeal carcinoma undergoing daily radiation therapy as well  as cisplatin  chemotherapy being admitted to the hospital with intractable nausea and vomiting.    Intractable nausea and vomiting-patient without abdominal pain, signs or symptoms of obstruction or other complication, no fevers.  This nausea and vomiting is likely due to her cisplatin  chemotherapy.  She received palonosetron  on 11/26 the day of her chemotherapy, so Zofran  needs to be avoided until 11/29. -Observation admission -N.p.o. and hold tube feeds for now -IV fluids -IV Phenergan  or IV Reglan  as needed for nausea  Stage III nasopharyngeal carcinoma-undergoing daily radiation therapy, and chemotherapy under the care of Dr. Jacobo -Pain control with IV Dilaudid  -Dr. Jacobo added to inpatient treatment team -Will notify rad onc of the patient's admission  Hypokalemia-due to GI losses, replete with IV potassium  Hypertension-continue home amlodipine   DVT prophylaxis: Lovenox      Code Status: Full Code  Consults called: None  Admission status: Observation  Time spent: 48 minutes  Ellison Rieth CHRISTELLA Gail MD [facility] Pager [phone]  If 7PM-7AM, please contact night-coverage [email or web address] Password [facility]  07/28/2024, 8:23 AM

## 2024-07-28 NOTE — Progress Notes (Signed)
 MEWS Progress Note  Patient Details Name: Brandi [name] MRN: [id number] DOB: 1996/05/02 Today's Date: 07/28/2024   MEWS Flowsheet Documentation:  Assess: MEWS Score Temp: 98.4 F (36.9 C) BP: (!) 165/108 MAP (mmHg): 126 Pulse Rate: (!) 114 ECG Heart Rate: 94 Resp: 17 Level of Consciousness: Alert SpO2: 100 % O2 Device: Room Air Assess: MEWS Score MEWS Temp: 0 MEWS Systolic: 0 MEWS Pulse: 2 MEWS RR: 0 MEWS LOC: 0 MEWS Score: 2 MEWS Score Color: Yellow Assess: SIRS CRITERIA SIRS Temperature : 0 SIRS Respirations : 0 SIRS Pulse: 1 SIRS WBC: 0 SIRS Score Sum : 1 Assess: if the MEWS score is Yellow or Red Were vital signs accurate and taken at a resting state?: No, vital signs rechecked Does the patient meet 2 or more of the SIRS criteria?: No        [name] 07/28/2024, 8:39 PM

## 2024-07-28 NOTE — ED Provider Notes (Signed)
 [facility] Provider Note    Event Date/Time   First MD Initiated Contact with Patient 07/28/24 0530     (approximate)   History   Emesis   HPI  Brandi [name] is a 28 y.o. 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  Starting to have some epigastric and lower chest discomfort from repeated episodes of vomiting.  Also having diarrhea.  No fever.  Last chemotherapy was 2 days ago.  Was seen in the emergency department last night for the same and states after she had her tube feeding tonight she started vomiting again and is unable to keep medications down at home with her Zofran .   History provided by patient.    Past Medical History:  Diagnosis Date   Asthma    Cancer (HCC)    head and neck, getting radiation   GERD (gastroesophageal reflux disease)    HTN (hypertension)     Past Surgical History:  Procedure Laterality Date   dental procedure     IR GASTROSTOMY TUBE MOD SED  07/05/2024   IR US  LIVER BIOPSY  05/17/2024    MEDICATIONS:  Prior to Admission medications   Medication Sig Start Date End Date Taking? Authorizing Provider  ALPRAZolam  (XANAX ) 0.25 MG tablet Take 1 tablet (0.25 mg total) by mouth at bedtime as needed for anxiety. 06/15/24   [name], MD  amLODipine  (NORVASC ) 5 MG tablet Place 1 tablet (5 mg total) into feeding tube daily. 07/09/24   Josette Ade, MD  chlorpheniramine-HYDROcodone  (TUSSIONEX) 10-8 MG/5ML Take 5 mLs by mouth every 12 (twelve) hours as needed for cough. 07/22/24   Claudene Rover, MD  cyclobenzaprine  (FLEXERIL ) 10 MG tablet Take 1 tablet (10 mg total) by mouth 3 (three) times daily as needed for muscle spasms. 06/14/24   [name], MD  fluconazole  (DIFLUCAN ) 200 MG tablet Take 1 tablet (200 mg total) by mouth daily. 07/19/24   [name], MD  lactulose  (CHRONULAC ) 10  GM/15ML solution Take 45 mLs (30 g total) by mouth daily as needed for severe constipation. 07/09/24   Josette Ade, MD  lidocaine  (XYLOCAINE ) 2 % solution Use as directed 15 mLs in the mouth or throat every 3 (three) hours as needed for mouth pain. swish and swallow. 06/27/24   Awanda City, MD  Multiple Vitamin (MULTIVITAMIN WITH MINERALS) TABS tablet Place 1 tablet into feeding tube daily. 07/09/24   Josette Ade, MD  Nutritional Supplements (ENSURE CLEAR) LIQD Take 1 Bottle by mouth 2 (two) times daily. 07/13/24   [name], MD  Nutritional Supplements (FEEDING SUPPLEMENT, OSMOLITE 1.5 CAL,) LIQD Place 119 mLs into feeding tube 6 (six) times daily. 07/09/24   Josette Ade, MD  nystatin  (MYCOSTATIN ) 100000 UNIT/ML suspension Take 5 mLs (500,000 Units total) by mouth 4 (four) times daily. 07/17/24   Borders, Fonda SAUNDERS, NP  ondansetron  (ZOFRAN ) 4 MG tablet Place 2 tablets (8 mg total) into feeding tube every 8 (eight) hours as needed for nausea or vomiting. 07/27/24   Levander Slate, MD  ondansetron  (ZOFRAN ) 8 MG tablet Place 1 tablet (8 mg total) into feeding tube every 8 (eight) hours as needed for nausea or vomiting. Start on the third day after cisplatin . 07/09/24   Josette Ade, MD  pantoprazole  sodium (PROTONIX ) 40 mg Place 40 mg into feeding tube daily for 14 days. 07/24/24 08/07/24  Ernest Ronal BRAVO, MD  phenol (  CHLORASEPTIC) 1.4 % LIQD Use as directed 1 spray in the mouth or throat as needed for throat irritation / pain. 06/27/24   Awanda City, MD  potassium chloride  20 MEQ/15ML (10%) SOLN Take 15 mLs (20 mEq total) by mouth daily. 07/17/24   Borders, Fonda SAUNDERS, NP  prednisoLONE  (PRELONE ) 15 MG/5ML SOLN Take 16.7 mLs (50 mg total) by mouth daily before breakfast for 4 days. 07/24/24 07/28/24  Ernest Ronal BRAVO, MD  prochlorperazine  (COMPAZINE ) 10 MG tablet Take 1 tablet (10 mg total) by mouth every 8 (eight) hours as needed for nausea or vomiting. 07/17/24   Borders, Fonda SAUNDERS, NP   senna-docusate (SENOKOT-S) 8.6-50 MG tablet Take 1 tablet by mouth 2 (two) times daily. 05/19/24   Fausto Sor A, DO  SLYND  4 MG TABS Take 1 tablet by mouth daily. 05/20/24   [provider]  thiamine  (VITAMIN B-1) 100 MG tablet Place 1 tablet (100 mg total) into feeding tube daily. 07/09/24   Josette Ade, MD  Water  For Irrigation, Sterile (FREE WATER ) SOLN Place 60 mLs into feeding tube 6 (six) times daily. 07/09/24   Josette Ade, MD    Physical Exam   Triage Vital Signs: ED Triage Vitals  Encounter Vitals Group     BP 07/28/24 0512 (!) 156/117     Girls Systolic BP Percentile --      Girls Diastolic BP Percentile --      Boys Systolic BP Percentile --      Boys Diastolic BP Percentile --      Pulse Rate 07/28/24 0512 (!) 111     Resp 07/28/24 0512 20     Temp 07/28/24 0512 97.9 F (36.6 C)     Temp Source 07/28/24 0512 Axillary     SpO2 07/28/24 0512 100 %     Weight --      Height --      Head Circumference --      Peak Flow --      Pain Score 07/28/24 0510 10     Pain Loc --      Pain Education --      Exclude from Growth Chart --     Most recent vital signs: Vitals:   07/28/24 0512  BP: (!) 156/117  Pulse: (!) 111  Resp: 20  Temp: 97.9 F (36.6 C)  SpO2: 100%    CONSTITUTIONAL: Alert, responds appropriately to questions.  Appears extremely uncomfortable HEAD: Normocephalic, atraumatic EYES: Conjunctivae clear, pupils appear equal, sclera nonicteric ENT: normal nose; dry mucous membranes, cracked lips NECK: Supple, normal ROM CARD: Regular and tachycardic; S1 and S2 appreciated RESP: Normal chest excursion without splinting or tachypnea; breath sounds clear and equal bilaterally; no wheezes, no rhonchi, no rales, no hypoxia or respiratory distress, speaking full sentences ABD/GI: Non-distended; soft, non-tender, no rebound, no guarding, no peritoneal signs; PEG tube in place without surrounding redness, warmth, drainage or bleeding BACK: The  back appears normal EXT: Normal ROM in all joints; no deformity noted, no edema SKIN: Normal color for age and race; warm; no rash on exposed skin NEURO: Moves all extremities equally, normal speech PSYCH: The patient's mood and manner are appropriate.   ED Results / Procedures / Treatments   LABS: (all labs ordered are listed, but only abnormal results are displayed) Labs Reviewed  CBC WITH DIFFERENTIAL/PLATELET - Abnormal; Notable for the following components:      Result Value   RBC 3.10 (*)    Hemoglobin 9.6 (*)  HCT 28.5 (*)    RDW 17.1 (*)    Platelets 425 (*)    Lymphs Abs 0.2 (*)    All other components within normal limits  COMPREHENSIVE METABOLIC PANEL WITH GFR - Abnormal; Notable for the following components:   Potassium 3.2 (*)    Glucose, Bld 123 (*)    AST 51 (*)    ALT 145 (*)    All other components within normal limits  LIPASE, BLOOD  HCG, QUANTITATIVE, PREGNANCY  URINALYSIS, W/ REFLEX TO CULTURE (INFECTION SUSPECTED)  MAGNESIUM   TROPONIN T, HIGH SENSITIVITY     EKG:    Date: 07/28/2024  Rate: 84  Rhythm: normal sinus rhythm  QRS Axis: normal  Intervals: normal  ST/T Wave abnormalities: T wave flattening in anterior leads  Conduction Disutrbances: none  Narrative Interpretation: T wave flattening in anterior leads similar compared to previous    RADIOLOGY: My personal review and interpretation of imaging:    I have personally reviewed all radiology reports.   No results found.   PROCEDURES:  Critical Care performed: No      .1-3 Lead EKG Interpretation  Performed by: Leona Alen, Josette SAILOR, DO Authorized by: Syanne Looney, Josette SAILOR, DO     Interpretation: abnormal     ECG rate:  111   ECG rate assessment: tachycardic     Rhythm: sinus tachycardia     Ectopy: none     Conduction: normal       IMPRESSION / MDM / ASSESSMENT AND PLAN / ED COURSE  I reviewed the triage vital signs and the nursing notes.    Patient here with intractable  vomiting, dehydration.  The patient is on the cardiac monitor to evaluate for evidence of arrhythmia and/or significant heart rate changes.   DIFFERENTIAL DIAGNOSIS (includes but not limited to):   Intractable vomiting secondary to chemotherapy, dehydration, doubt appendicitis, bowel obstruction, colitis, perforation, esophageal rupture, ACS, PE   Patient's presentation is most consistent with acute presentation with potential threat to life or bodily function.   PLAN: Will repeat labs, urine today and obtain EKG and chest x-ray but labs from 12 hours ago showed no significant electrolyte derangement.  Creatinine normal.  Abdominal exam today is benign.  No indication for emergent abdominal imaging.  Will give IV fluids, Zofran , pain medication.  I feel she will need admission as she has had intractable symptoms and this is her second visit to the ED in less than 12 hours and she appears dehydrated clinically on exam.  She is agreeable to this plan.  Will discuss with the hospitalist.  MEDICATIONS GIVEN IN ED: Medications  0.9 %  sodium chloride  infusion (has no administration in time range)  potassium chloride  10 mEq in 100 mL IVPB (has no administration in time range)  sodium chloride  0.9 % bolus 2,000 mL (2,000 mLs Intravenous New Bag/Given 07/28/24 0622)  HYDROmorphone  (DILAUDID ) injection 1 mg (1 mg Intravenous Given 07/28/24 0618)  ondansetron  (ZOFRAN ) injection 4 mg (4 mg Intravenous Given 07/28/24 0617)  pantoprazole  (PROTONIX ) injection 80 mg (80 mg Intravenous Given 07/28/24 0619)     ED COURSE: EKG shows no new ischemic change, arrhythmia or interval abnormality.  Patient has anemia which is chronic.  Potassium is 3.2.  Minimally elevated LFTs.  Normal lipase.  Troponin negative.  Will admit for symptom management.  CONSULTS:  Consulted and discussed patient's case with hospitalist, Dr. Lawence.  I have recommended admission and consulting physician agrees and will place  admission orders.  Patient (  and family if present) agree with this plan.   I reviewed all nursing notes, vitals, pertinent previous records.  All labs, EKGs, imaging ordered have been independently reviewed and interpreted by myself.    OUTSIDE RECORDS REVIEWED: Reviewed recent oncology notes.       FINAL CLINICAL IMPRESSION(S) / ED DIAGNOSES   Final diagnoses:  Intractable vomiting with nausea  Dehydration     Rx / DC Orders   ED Discharge Orders     None        Note:  This document was prepared using Dragon voice recognition software and may include unintentional dictation errors.   Keilyn Nadal, Josette SAILOR, DO 07/28/24 [phone]

## 2024-07-28 NOTE — ED Triage Notes (Signed)
 Pt reports that she was just d/c last night from the ED. States that she gave herself her tube feedings when she got home and started vomiting again approximately 1 hour after administration. Last chemotherapy Wednesday. Pain in the epigastric area that goes up through her chest. No additional meds taken since being home. Dry heaves during triage. Denies fevers.

## 2024-07-29 ENCOUNTER — Ambulatory Visit

## 2024-07-29 DIAGNOSIS — R112 Nausea with vomiting, unspecified: Secondary | ICD-10-CM | POA: Diagnosis not present

## 2024-07-29 LAB — BASIC METABOLIC PANEL WITH GFR
Anion gap: 11 (ref 5–15)
BUN: 7 mg/dL (ref 6–20)
CO2: 24 mmol/L (ref 22–32)
Calcium: 8.6 mg/dL — ABNORMAL LOW (ref 8.9–10.3)
Chloride: 100 mmol/L (ref 98–111)
Creatinine, Ser: 0.59 mg/dL (ref 0.44–1.00)
GFR, Estimated: >60 mL/min (ref >60)
Glucose, Bld: 99 mg/dL (ref 70–99)
Potassium: 4 mmol/L (ref 3.5–5.1)
Sodium: 134 mmol/L — ABNORMAL LOW (ref 135–145)

## 2024-07-29 LAB — CBC
HCT: 28.9 % — ABNORMAL LOW (ref 36.0–46.0)
Hemoglobin: 9.5 g/dL — ABNORMAL LOW (ref 12.0–15.0)
MCH: 30.8 pg (ref 26.0–34.0)
MCHC: 32.9 g/dL (ref 30.0–36.0)
MCV: 93.8 fL (ref 80.0–100.0)
Platelets: 372 K/uL (ref 150–400)
RBC: 3.08 MIL/uL — ABNORMAL LOW (ref 3.87–5.11)
RDW: 17.2 % — ABNORMAL HIGH (ref 11.5–15.5)
WBC: 2.9 K/uL — ABNORMAL LOW (ref 4.0–10.5)
nRBC: 0.7 % — ABNORMAL HIGH (ref 0.0–0.2)

## 2024-07-29 MED ORDER — MAGNESIUM SULFATE 2 GM/50ML IV SOLN
2.0000 g | Freq: Once | INTRAVENOUS | Status: AC
  Administered 2024-07-29: 2 g via INTRAVENOUS
  Filled 2024-07-29: qty 50

## 2024-07-29 NOTE — Discharge Summary (Signed)
 Physician Discharge Summary   Patient: Brandi [name] MRN: [id number] DOB: [birth year]  Admit date:     07/28/2024  Discharge date: 07/29/24  Discharge Physician: Murvin Mana   PCP: [name], MD   Recommendations at discharge:   Follow-up with PCP in 1 week. Follow-up with oncology as previous scheduled.  Discharge Diagnoses: Principal Problem:   Intractable nausea and vomiting Active Problems:   Hypomagnesemia Hyponatremia. Pancytopenia secondary to chemotherapy. Class II obesity. Resolved Problems:   * No resolved hospital problems. * Hypokalemia. Reactive thrombocytosis. Hospital Course: Brandi [name] is a 28 y.o. 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  She is completely PEG tube dependent.  She received her last chemotherapy on 11/26 and the following day started having some crampy abdominal discomfort, nausea and vomiting.  She was told to hold off nausea medicine for 2 days after chemotherapy. After arriving hospital, patient was given IV fluids and symptomatic treatment.  Her condition had improved today.  She is tolerating tube feeding again.  Her potassium has normalized, she received 2 g magnesium  sulfate for magnesium  1.6.  She is no longer nauseous.  She is medically stable for discharge.        Consultants: None Procedures performed: None  Disposition: Home Diet recommendation:  Discharge Diet Orders (From admission, onward)     Start     Ordered   07/29/24 0000  Diet general       Comments: NPO, Tube feeding only   07/29/24 0949           NPO, TF only DISCHARGE MEDICATION: Allergies as of 07/29/2024       Reactions   Shellfish Allergy Anaphylaxis   Tomato Other (See Comments)   Flares up exema        Medication List     STOP taking these medications     CertaVite/Antioxidants Tabs   chlorpheniramine-HYDROcodone  10-8 MG/5ML Commonly known as: TUSSIONEX   lidocaine  2 % solution Commonly known as: XYLOCAINE    nystatin  100000 UNIT/ML suspension Commonly known as: MYCOSTATIN    prednisoLONE  15 MG/5ML Soln Commonly known as: PRELONE    senna-docusate 8.6-50 MG tablet Commonly known as: Senokot-S   thiamine  100 MG tablet Commonly known as: VITAMIN B1       TAKE these medications    ALPRAZolam  0.25 MG tablet Commonly known as: XANAX  Take 1 tablet (0.25 mg total) by mouth at bedtime as needed for anxiety.   amLODipine  5 MG tablet Commonly known as: NORVASC  Place 1 tablet (5 mg total) into feeding tube daily.   cyclobenzaprine  10 MG tablet Commonly known as: FLEXERIL  Take 1 tablet (10 mg total) by mouth 3 (three) times daily as needed for muscle spasms.   feeding supplement (OSMOLITE 1.5 CAL) Liqd Place 119 mLs into feeding tube 6 (six) times daily.   Ensure Clear Liqd Take 1 Bottle by mouth 2 (two) times daily.   fluconazole  200 MG tablet Commonly known as: DIFLUCAN  Take 1 tablet (200 mg total) by mouth daily.   free water  Soln Place 60 mLs into feeding tube 6 (six) times daily.   lactulose  10 GM/15ML solution Commonly known as: CHRONULAC  Take 45 mLs (30 g total) by mouth daily as needed for severe constipation.   ondansetron  4 MG tablet Commonly known as: Zofran  Place 2 tablets (8 mg total) into feeding tube every 8 (eight) hours as needed for nausea  or vomiting. What changed: Another medication with the same name was removed. Continue taking this medication, and follow the directions you see here.   pantoprazole  sodium 40 mg Commonly known as: PROTONIX  Place 40 mg into feeding tube daily for 14 days.   phenol 1.4 % Liqd Commonly known as: CHLORASEPTIC Use as directed 1 spray in the mouth or throat as needed for throat irritation / pain.   potassium chloride  20 MEQ/15ML (10%) Soln Take 15 mLs (20 mEq  total) by mouth daily.   prochlorperazine  10 MG tablet Commonly known as: COMPAZINE  Take 1 tablet (10 mg total) by mouth every 8 (eight) hours as needed for nausea or vomiting.   Slynd  4 MG Tabs Generic drug: Drospirenone  Take 1 tablet by mouth daily.        Follow-up Information     Osei-Bonsu, Zachary, MD Follow up in 1 week(s).   Specialty: Internal Medicine Why: hospital follow up Contact information: [address] [place] [address] [phone]                Discharge Exam: Brandi [name]   07/28/24 2020  Weight: 89.8 kg   General exam: Appears calm and comfortable  Respiratory system: Clear to auscultation. Respiratory effort normal. Cardiovascular system: S1 & S2 heard, RRR. No JVD, murmurs, rubs, gallops or clicks. No pedal edema. Gastrointestinal system: Abdomen is nondistended, soft and nontender. No organomegaly or masses felt. Normal bowel sounds heard. Central nervous system: Alert and oriented. No focal neurological deficits. Extremities: Symmetric 5 x 5 power. Skin: No rashes, lesions or ulcers Psychiatry: Judgement and insight appear normal. Mood & affect appropriate.    Condition at discharge: good  The results of significant diagnostics from this hospitalization (including imaging, microbiology, ancillary and laboratory) are listed below for reference.   Imaging Studies: DG Chest Port 1 View Result Date: 07/24/2024 EXAM: 1 VIEW(S) XRAY OF THE CHEST 07/24/2024 01:58:08 AM COMPARISON: 07/22/2024 CLINICAL HISTORY: SOB (shortness of breath) FINDINGS: LUNGS AND PLEURA: No focal pulmonary opacity. No pleural effusion. No pneumothorax. HEART AND MEDIASTINUM: No acute abnormality of the cardiac and mediastinal silhouettes. BONES AND SOFT TISSUES: No acute osseous abnormality. IMPRESSION: 1. No acute cardiopulmonary process to explain shortness of breath. Electronically signed by: Oneil Devonshire MD 07/24/2024 02:15 AM EST RP Workstation: [id number]    DG ABDOMEN PEG TUBE LOCATION Result Date: 07/24/2024 EXAM: 1 VIEW XRAY OF THE ABDOMEN 07/24/2024 01:58:08 AM COMPARISON: None available. CLINICAL HISTORY: S/P percutaneous endoscopic gastrostomy (PEG) tube placement (HCC) FINDINGS: BOWEL: Nonobstructive bowel gas pattern. Contrast material opacifies the stomach and duodenal C-loop. SOFT TISSUES: Gastrostomy tube is in place overlying the mid body of the stomach. No opaque urinary calculi. BONES: No acute osseous abnormality. IMPRESSION: 1. Gastrostomy tube appropriately positioned within the stomach with contrast opacifying the stomach and duodenal C-loop. Electronically signed by: Oneil Devonshire MD 07/24/2024 02:13 AM EST RP Workstation: [id number]   DG Chest 2 View Result Date: 07/22/2024 EXAM: 2 VIEW(S) XRAY OF THE CHEST 07/22/2024 08:34:16 PM COMPARISON: 07/03/2024 CLINICAL HISTORY: productive cough, eval infiltrate FINDINGS: LUNGS AND PLEURA: No focal pulmonary opacity. No pleural effusion. No pneumothorax. HEART AND MEDIASTINUM: No acute abnormality of the cardiac and mediastinal silhouettes. BONES AND SOFT TISSUES: No acute osseous abnormality. IMPRESSION: 1. No acute cardiopulmonary process. Electronically signed by: Elsie Gravely MD 07/22/2024 08:39 PM EST RP Workstation: [id number]   CT ABDOMEN PELVIS W CONTRAST Result Date: 07/07/2024 CLINICAL DATA:  Abdominal pain status post gastrostomy placement EXAM: CT ABDOMEN AND PELVIS WITH  CONTRAST TECHNIQUE: Multidetector CT imaging of the abdomen and pelvis was performed using the standard protocol following bolus administration of intravenous contrast. RADIATION DOSE REDUCTION: This exam was performed according to the departmental dose-optimization program which includes automated exposure control, adjustment of the mA and/or kV according to patient size and/or use of iterative reconstruction technique. CONTRAST:  OMNIPAQUE  IOHEXOL  300 MG/ML  SOLN COMPARISON:  CT abdomen and pelvis dated  06/18/2024 FINDINGS: Lower chest: No focal consolidation or pulmonary nodule in the lung bases. No pleural effusion or pneumothorax demonstrated. Partially imaged heart size is normal. Hepatobiliary: No focal hepatic lesions. No intra or extrahepatic biliary ductal dilation. Normal gallbladder. Pancreas: No focal lesions or main ductal dilation. Spleen: Normal in size without focal abnormality. Adrenals/Urinary Tract: No adrenal nodules. No suspicious renal mass, calculi or hydronephrosis. No focal bladder wall thickening. Stomach/Bowel: Percutaneous gastrostomy tube in-situ. T tacks remain, tenting the anterior gastric wall. No evidence of bowel wall thickening, distention, or inflammatory changes. Normal appendix. Vascular/Lymphatic: No significant vascular findings are present. No enlarged abdominal or pelvic lymph nodes. Reproductive: No adnexal masses. Other: Punctate foci of free air along the anterior midline peritoneum. No free fluid or fluid collection. Musculoskeletal: No acute or abnormal lytic or blastic osseous lesions. IMPRESSION: 1. Percutaneous gastrostomy tube in-situ. 2. Punctate foci of free air along the anterior midline peritoneum, likely related to recent gastrostomy tube placement. Electronically Signed   By: Limin  Xu M.D.   On: 07/07/2024 15:48   IR GASTROSTOMY TUBE MOD SED Result Date: 07/05/2024 INDICATION: 28 year old female with nasopharyngeal carcinoma currently on therapy but struggling with dysphagia. She presents for placement of a percutaneous gastrostomy tube for supplemental nutrition. EXAM: Fluoroscopically guided placement of percutaneous push in balloon retention gastrostomy tube Interventional Radiologist:  Wilkie LOIS Lent, MD MEDICATIONS: 1 mg glucagon  and 4 mg Zofran  administered intravenously by the Radiology nurse ANESTHESIA/SEDATION: Versed  5 mg IV; Fentanyl  100 mcg IV and 1 mg Dilaudid  administered intravenously by the Radiology nurse Moderate Sedation Time:  25  minutes The patient's vital signs and level of consciousness were continuously monitored during the procedure by the interventional radiology nurse under my direct supervision. CONTRAST:  10mL OMNIPAQUE  IOHEXOL  300 MG/ML SOLN FLUOROSCOPY: Radiation exposure index: 26.3 mGy, air kerma COMPLICATIONS: None immediate. PROCEDURE: Informed written consent was obtained from the patient after a thorough discussion of the procedural risks, benefits and alternatives. All questions were addressed. Maximal Sterile Barrier Technique was utilized including caps, mask, sterile gowns, sterile gloves, sterile drape, hand hygiene and skin antiseptic. A timeout was performed prior to the initiation of the procedure. Maximal barrier sterile technique utilized including caps, mask, sterile gowns, sterile gloves, large sterile drape, hand hygiene, and chlorhexadine skin prep. An angled catheter was advanced over a wire under fluoroscopic guidance through the nose, down the esophagus and into the body of the stomach. The stomach was then insufflated with several 100 ml of air. Fluoroscopy confirmed location of the gastric bubble, as well as inferior displacement of the barium stained colon. Under direct fluoroscopic guidance, two T-tacks were placed, and the anterior gastric wall drawn up against the anterior abdominal wall. Percutaneous access was then obtained into the mid gastric body with an 18 gauge trocar needle. Aspiration of air, and injection of contrast material under fluoroscopy confirmed needle placement. An Amplatz wire was advanced in the gastric body and the access needle was removed. A 10 x 100 mm Athletis balloon was then advanced coaxially through a 18 French balloon retention percutaneous gastrostomy  tube. The balloon/gastrostomy tube assembly was then advanced over the wire. The balloon was advanced through the anterior abdominal wall along the percutaneous tract. The balloon was then inflated to full effacement  dilating the tract. As the balloon was deflated, the assembly was advanced over the wire and into the stomach carrying the gastrostomy tube into the stomach. The retention balloon was filled with 10 mL saline. Contrast was injected through the tube confirming its location within the stomach. The tube was then secured with the external bumper and capped. The patient will be observed for several hours with the newly placed tube on low wall suction to evaluate for any post procedure complication. The patient tolerated the procedure well, there is no immediate complication. IMPRESSION: Successful placement of an 70 French balloon retention gastrostomy tube. Electronically Signed   By: Wilkie Lent M.D.   On: 07/05/2024 14:29   DG Chest 1 View Result Date: 07/03/2024 EXAM: 1 VIEW(S) XRAY OF THE CHEST 07/03/2024 05:10:42 AM COMPARISON: PA and lateral radiographs of the chest dated 10/31/2012. CLINICAL HISTORY: worsening weakness FINDINGS: LINES, TUBES AND DEVICES: Monitor wires noted. LUNGS AND PLEURA: No focal pulmonary opacity. No pulmonary edema. No pleural effusion. No pneumothorax. HEART AND MEDIASTINUM: No acute abnormality of the cardiac and mediastinal silhouettes. BONES AND SOFT TISSUES: No acute osseous abnormality. IMPRESSION: 1. No acute process. Electronically signed by: Evalene Coho MD 07/03/2024 05:20 AM EST RP Workstation: [id number]    Microbiology: Results for orders placed or performed during the hospital encounter of 07/23/24  Group A Strep by PCR ([facility] Only)     Status: None   Collection Time: 07/24/24  1:20 AM   Specimen: Throat; Sterile Swab  Result Value Ref Range Status   Group A Strep by PCR NOT DETECTED NOT DETECTED Final    Comment: Performed at [facility], [address], [place], [place] [address]  Resp panel by RT-PCR (RSV, Flu A&B, Covid) Anterior Nasal Swab     Status: None   Collection Time: 07/24/24  1:20 AM   Specimen: Anterior Nasal Swab  Result  Value Ref Range Status   SARS Coronavirus 2 by RT PCR NEGATIVE NEGATIVE Final    Comment: (NOTE) SARS-CoV-2 target nucleic acids are NOT DETECTED.  The SARS-CoV-2 RNA is generally detectable in upper respiratory specimens during the acute phase of infection. The lowest concentration of SARS-CoV-2 viral copies this assay can detect is 138 copies/mL. A negative result does not preclude SARS-Cov-2 infection and should not be used as the sole basis for treatment or other patient management decisions. A negative result may occur with  improper specimen collection/handling, submission of specimen other than nasopharyngeal swab, presence of viral mutation(s) within the areas targeted by this assay, and inadequate number of viral copies(<138 copies/mL). A negative result must be combined with clinical observations, patient history, and epidemiological information. The expected result is Negative.  Fact Sheet for Patients:  bloggercourse.com  Fact Sheet for Healthcare Providers:  seriousbroker.it  This test is no t yet approved or cleared by the [place]  FDA and  has been authorized for detection and/or diagnosis of SARS-CoV-2 by FDA under an Emergency Use Authorization (EUA). This EUA will remain  in effect (meaning this test can be used) for the duration of the COVID-19 declaration under Section 564(b)(1) of the Act, 21 U.S.C.section 360bbb-3(b)(1), unless the authorization is terminated  or revoked sooner.       Influenza A by PCR NEGATIVE NEGATIVE Final   Influenza B by PCR  NEGATIVE NEGATIVE Final    Comment: (NOTE) The Xpert Xpress SARS-CoV-2/FLU/RSV plus assay is intended as an aid in the diagnosis of influenza from Nasopharyngeal swab specimens and should not be used as a sole basis for treatment. Nasal washings and aspirates are unacceptable for Xpert Xpress SARS-CoV-2/FLU/RSV testing.  Fact Sheet for  Patients: bloggercourse.com  Fact Sheet for Healthcare Providers: seriousbroker.it  This test is not yet approved or cleared by the [place]  FDA and has been authorized for detection and/or diagnosis of SARS-CoV-2 by FDA under an Emergency Use Authorization (EUA). This EUA will remain in effect (meaning this test can be used) for the duration of the COVID-19 declaration under Section 564(b)(1) of the Act, 21 U.S.C. section 360bbb-3(b)(1), unless the authorization is terminated or revoked.     Resp Syncytial Virus by PCR NEGATIVE NEGATIVE Final    Comment: (NOTE) Fact Sheet for Patients: bloggercourse.com  Fact Sheet for Healthcare Providers: seriousbroker.it  This test is not yet approved or cleared by the [place]  FDA and has been authorized for detection and/or diagnosis of SARS-CoV-2 by FDA under an Emergency Use Authorization (EUA). This EUA will remain in effect (meaning this test can be used) for the duration of the COVID-19 declaration under Section 564(b)(1) of the Act, 21 U.S.C. section 360bbb-3(b)(1), unless the authorization is terminated or revoked.  Performed at [facility], [address] [place], [place] [address]     Labs: CBC: Recent Labs  Lab 07/22/24 2011 07/24/24 0035 07/26/24 0910 07/27/24 1622 07/28/24 0533 07/29/24 0451  WBC 3.1* 3.9* 3.9* 6.2 5.2 2.9*  NEUTROABS 2.4 3.2 3.2 5.7 4.5  --   HGB 9.8* 9.6* 10.2* 10.4* 9.6* 9.5*  HCT 29.7* 27.9* 30.2* 30.6* 28.5* 28.9*  MCV 93.4 90.0 90.4 90.5 91.9 93.8  PLT 473* 444* 491* 493* 425* 372   Basic Metabolic Panel: Recent Labs  Lab 07/22/24 2007 07/24/24 0035 07/26/24 0910 07/27/24 1622 07/28/24 0533 07/28/24 0541 07/29/24 0451  NA 138 134* 135 134* 135  --  134*  K 3.6 3.8 3.6 3.7 3.2*  --  4.0  CL 101 101 101 97* 98  --  100  CO2 24 20* 25 25 25   --  24  GLUCOSE 94  101* 112* 120* 123*  --  99  BUN 16 15 13 13 12   --  7  CREATININE 0.89 0.80 0.84 0.72 0.70  --  0.59  CALCIUM 9.7 9.3 9.3 10.0 9.2  --  8.6*  MG 2.0  --  1.8  --   --  1.6*  --    Liver Function Tests: Recent Labs  Lab 07/22/24 2007 07/24/24 0035 07/27/24 1622 07/28/24 0533  AST 25 36 45* 51*  ALT 61* 73* 139* 145*  ALKPHOS 69 65 74 65  BILITOT 0.5 0.4 0.4 0.3  PROT 7.1 6.9 7.4 6.6  ALBUMIN 4.3 4.2 4.4 4.0   CBG: No results for input(s): GLUCAP in the last 168 hours.  Discharge time spent: .  Signed: Murvin Mana, MD [facility] 07/29/2024

## 2024-07-29 NOTE — Plan of Care (Signed)

## 2024-07-30 ENCOUNTER — Inpatient Hospital Stay
Admission: EM | Admit: 2024-07-30 | Discharge: 2024-08-01 | DRG: 392 | Disposition: A | Attending: Internal Medicine | Admitting: Internal Medicine

## 2024-07-30 ENCOUNTER — Other Ambulatory Visit: Payer: Self-pay

## 2024-07-30 ENCOUNTER — Encounter: Payer: Self-pay | Admitting: Emergency Medicine

## 2024-07-30 DIAGNOSIS — I10 Essential (primary) hypertension: Secondary | ICD-10-CM | POA: Diagnosis present

## 2024-07-30 DIAGNOSIS — K920 Hematemesis: Secondary | ICD-10-CM | POA: Diagnosis present

## 2024-07-30 DIAGNOSIS — R112 Nausea with vomiting, unspecified: Principal | ICD-10-CM | POA: Diagnosis present

## 2024-07-30 DIAGNOSIS — D72819 Decreased white blood cell count, unspecified: Secondary | ICD-10-CM | POA: Diagnosis present

## 2024-07-30 DIAGNOSIS — J45909 Unspecified asthma, uncomplicated: Secondary | ICD-10-CM | POA: Diagnosis present

## 2024-07-30 DIAGNOSIS — E669 Obesity, unspecified: Secondary | ICD-10-CM | POA: Diagnosis present

## 2024-07-30 DIAGNOSIS — Z931 Gastrostomy status: Secondary | ICD-10-CM

## 2024-07-30 DIAGNOSIS — D649 Anemia, unspecified: Secondary | ICD-10-CM | POA: Diagnosis present

## 2024-07-30 DIAGNOSIS — C119 Malignant neoplasm of nasopharynx, unspecified: Secondary | ICD-10-CM | POA: Diagnosis present

## 2024-07-30 DIAGNOSIS — K219 Gastro-esophageal reflux disease without esophagitis: Secondary | ICD-10-CM | POA: Diagnosis present

## 2024-07-30 LAB — URINALYSIS, ROUTINE W REFLEX MICROSCOPIC
Bilirubin Urine: NEGATIVE
Glucose, UA: NEGATIVE mg/dL
Hgb urine dipstick: NEGATIVE
Ketones, ur: NEGATIVE mg/dL
Leukocytes,Ua: NEGATIVE
Nitrite: NEGATIVE
Protein, ur: 100 mg/dL — AB
Specific Gravity, Urine: 1.018 (ref 1.005–1.030)
pH: 9 — ABNORMAL HIGH (ref 5.0–8.0)

## 2024-07-30 LAB — COMPREHENSIVE METABOLIC PANEL WITH GFR
ALT: 138 U/L — ABNORMAL HIGH (ref 0–44)
AST: 39 U/L (ref 15–41)
Albumin: 4.2 g/dL (ref 3.5–5.0)
Alkaline Phosphatase: 68 U/L (ref 38–126)
Anion gap: 16 — ABNORMAL HIGH (ref 5–15)
BUN: 10 mg/dL (ref 6–20)
CO2: 25 mmol/L (ref 22–32)
Calcium: 9.7 mg/dL (ref 8.9–10.3)
Chloride: 95 mmol/L — ABNORMAL LOW (ref 98–111)
Creatinine, Ser: 0.86 mg/dL (ref 0.44–1.00)
GFR, Estimated: >60 mL/min (ref >60)
Glucose, Bld: 118 mg/dL — ABNORMAL HIGH (ref 70–99)
Potassium: 3.5 mmol/L (ref 3.5–5.1)
Sodium: 136 mmol/L (ref 135–145)
Total Bilirubin: 0.4 mg/dL (ref 0.0–1.2)
Total Protein: 6.9 g/dL (ref 6.5–8.1)

## 2024-07-30 LAB — LIPASE, BLOOD: Lipase: 15 U/L (ref 11–51)

## 2024-07-30 LAB — CBC
HCT: 30.9 % — ABNORMAL LOW (ref 36.0–46.0)
Hemoglobin: 10.5 g/dL — ABNORMAL LOW (ref 12.0–15.0)
MCH: 31.2 pg (ref 26.0–34.0)
MCHC: 34 g/dL (ref 30.0–36.0)
MCV: 91.7 fL (ref 80.0–100.0)
Platelets: 372 K/uL (ref 150–400)
RBC: 3.37 MIL/uL — ABNORMAL LOW (ref 3.87–5.11)
RDW: 16.9 % — ABNORMAL HIGH (ref 11.5–15.5)
WBC: 2 K/uL — ABNORMAL LOW (ref 4.0–10.5)
nRBC: 0 % (ref 0.0–0.2)

## 2024-07-30 MED ORDER — FENTANYL CITRATE (PF) 50 MCG/ML IJ SOSY
50.0000 ug | PREFILLED_SYRINGE | Freq: Once | INTRAMUSCULAR | Status: AC
  Administered 2024-07-30: 50 ug via INTRAVENOUS
  Filled 2024-07-30: qty 1

## 2024-07-30 MED ORDER — HALOPERIDOL LACTATE 5 MG/ML IJ SOLN
5.0000 mg | Freq: Once | INTRAMUSCULAR | Status: AC
  Administered 2024-07-30: 5 mg via INTRAVENOUS
  Filled 2024-07-30: qty 1

## 2024-07-30 MED ORDER — ONDANSETRON HCL 4 MG/2ML IJ SOLN
4.0000 mg | Freq: Once | INTRAMUSCULAR | Status: DC
  Filled 2024-07-30: qty 2

## 2024-07-30 MED ORDER — SODIUM CHLORIDE 0.9 % IV BOLUS
1000.0000 mL | Freq: Once | INTRAVENOUS | Status: AC
  Administered 2024-07-30: 1000 mL via INTRAVENOUS

## 2024-07-30 MED ORDER — METOCLOPRAMIDE HCL 5 MG/ML IJ SOLN
10.0000 mg | Freq: Once | INTRAMUSCULAR | Status: AC
  Administered 2024-07-30: 10 mg via INTRAVENOUS
  Filled 2024-07-30: qty 2

## 2024-07-30 NOTE — ED Provider Notes (Signed)
 [facility] Provider Note    Event Date/Time   First MD Initiated Contact with Patient 07/30/24 1942     (approximate)   History   Nausea and vomiting   HPI  Brandi [name] is a 28 y.o. female who presents to the emergency department today with primary concerns for nausea and vomiting.  Patient has history of similar symptoms.  Was discharged from the hospital yesterday for the symptoms.  She does have a history of nasopharyngeal cancer and is PEG tube dependent.  She says that since going home she started developing nausea and vomiting last night.  Did try the Zofran  that has been prescribed to her without any significant relief.  She denies any fevers or chills.     Physical Exam   Triage Vital Signs: ED Triage Vitals  Encounter Vitals Group     BP 07/30/24 1905 (!) 134/104     Girls Systolic BP Percentile --      Girls Diastolic BP Percentile --      Boys Systolic BP Percentile --      Boys Diastolic BP Percentile --      Pulse Rate 07/30/24 1905 (!) 129     Resp 07/30/24 1905 20     Temp 07/30/24 1903 98.7 F (37.1 C)     Temp Source 07/30/24 1903 Oral     SpO2 07/30/24 1905 100 %     Weight --      Height --      Head Circumference --      Peak Flow --      Pain Score 07/30/24 1906 10     Pain Loc --      Pain Education --      Exclude from Growth Chart --     Most recent vital signs: Vitals:   07/30/24 1903 07/30/24 1905  BP:  (!) 134/104  Pulse:  (!) 129  Resp:  20  Temp: 98.7 F (37.1 C)   SpO2:  100%   General: Awake, alert, oriented. CV:  Good peripheral perfusion. Tachycardia. Resp:  Normal effort. Regular rate and rhythm. Abd:  No distention. Non tender.   ED Results / Procedures / Treatments   Labs (all labs ordered are listed, but only abnormal results are displayed) Labs Reviewed  COMPREHENSIVE METABOLIC PANEL WITH GFR - Abnormal; Notable for the following components:      Result Value   Chloride 95 (*)     Glucose, Bld 118 (*)    ALT 138 (*)    Anion gap 16 (*)    All other components within normal limits  CBC - Abnormal; Notable for the following components:   WBC 2.0 (*)    RBC 3.37 (*)    Hemoglobin 10.5 (*)    HCT 30.9 (*)    RDW 16.9 (*)    All other components within normal limits  URINALYSIS, ROUTINE W REFLEX MICROSCOPIC - Abnormal; Notable for the following components:   Color, Urine YELLOW (*)    APPearance HAZY (*)    pH 9.0 (*)    Protein, ur 100 (*)    Bacteria, UA RARE (*)    All other components within normal limits  LIPASE, BLOOD  POC URINE PREG, ED     EKG  None   RADIOLOGY None   PROCEDURES:  Critical Care performed: No    MEDICATIONS ORDERED IN ED: Medications - No data to display   IMPRESSION / MDM / ASSESSMENT AND  PLAN / ED COURSE  I reviewed the triage vital signs and the nursing notes.                              Differential diagnosis includes, but is not limited to, obstruction, gastroenteritis, chronic n/v  Patient's presentation is most consistent with acute presentation with potential threat to life or bodily function.   The patient is on the cardiac monitor to evaluate for evidence of arrhythmia and/or significant heart rate changes.  Patient presented to the emergency department today because of concerns for nausea and vomiting.  Patient had just been admitted to the hospital and discharged yesterday for the same symptoms.  On exam patient with occasional dry heaving.  Abdomen is benign.  Blood work here without concerning leukocytosis or electrolyte abnormality.  Patient however is tachycardic.  Patient was given both Zofran  as well as Haldol  here in the emergency department with continued symptoms.  Patient also with continued tachycardia.  Will discuss with hospitalist service for admission.      FINAL CLINICAL IMPRESSION(S) / ED DIAGNOSES   Final diagnoses:  Nausea and vomiting, unspecified vomiting type      Note:   This document was prepared using Dragon voice recognition software and may include unintentional dictation errors.    Floy Roberts, MD 07/30/24 [phone]

## 2024-07-30 NOTE — ED Triage Notes (Addendum)
 Pt reports N/V and abd pain. Was recently admitted & d/c at 10am yesterday morning. Reports she began vomiting again last night at 10pm. Reports she has a feeding tube but can't keep anything down. Last chemo tx Wednesday

## 2024-07-31 ENCOUNTER — Ambulatory Visit

## 2024-07-31 ENCOUNTER — Other Ambulatory Visit: Payer: Self-pay

## 2024-07-31 DIAGNOSIS — E669 Obesity, unspecified: Secondary | ICD-10-CM

## 2024-07-31 DIAGNOSIS — K920 Hematemesis: Secondary | ICD-10-CM | POA: Diagnosis present

## 2024-07-31 DIAGNOSIS — D72819 Decreased white blood cell count, unspecified: Secondary | ICD-10-CM | POA: Diagnosis present

## 2024-07-31 DIAGNOSIS — C119 Malignant neoplasm of nasopharynx, unspecified: Secondary | ICD-10-CM

## 2024-07-31 DIAGNOSIS — I10 Essential (primary) hypertension: Secondary | ICD-10-CM

## 2024-07-31 DIAGNOSIS — Z931 Gastrostomy status: Secondary | ICD-10-CM | POA: Diagnosis not present

## 2024-07-31 DIAGNOSIS — R112 Nausea with vomiting, unspecified: Secondary | ICD-10-CM

## 2024-07-31 DIAGNOSIS — D649 Anemia, unspecified: Secondary | ICD-10-CM

## 2024-07-31 DIAGNOSIS — K2100 Gastro-esophageal reflux disease with esophagitis, without bleeding: Secondary | ICD-10-CM

## 2024-07-31 LAB — BASIC METABOLIC PANEL WITH GFR
Anion gap: 10 (ref 5–15)
BUN: 10 mg/dL (ref 6–20)
CO2: 26 mmol/L (ref 22–32)
Calcium: 8.7 mg/dL — ABNORMAL LOW (ref 8.9–10.3)
Chloride: 100 mmol/L (ref 98–111)
Creatinine, Ser: 0.72 mg/dL (ref 0.44–1.00)
GFR, Estimated: >60 mL/min (ref >60)
Glucose, Bld: 105 mg/dL — ABNORMAL HIGH (ref 70–99)
Potassium: 3.5 mmol/L (ref 3.5–5.1)
Sodium: 137 mmol/L (ref 135–145)

## 2024-07-31 LAB — CBC WITH DIFFERENTIAL/PLATELET
Abs Immature Granulocytes: 0.01 K/uL (ref 0.00–0.07)
Basophils Absolute: 0 K/uL (ref 0.0–0.1)
Basophils Relative: 0 %
Eosinophils Absolute: 0 K/uL (ref 0.0–0.5)
Eosinophils Relative: 0 %
HCT: 26.9 % — ABNORMAL LOW (ref 36.0–46.0)
Hemoglobin: 9 g/dL — ABNORMAL LOW (ref 12.0–15.0)
Immature Granulocytes: 0 %
Lymphocytes Relative: 7 %
Lymphs Abs: 0.2 K/uL — ABNORMAL LOW (ref 0.7–4.0)
MCH: 30.9 pg (ref 26.0–34.0)
MCHC: 33.5 g/dL (ref 30.0–36.0)
MCV: 92.4 fL (ref 80.0–100.0)
Monocytes Absolute: 0.5 K/uL (ref 0.1–1.0)
Monocytes Relative: 18 %
Neutro Abs: 2.3 K/uL (ref 1.7–7.7)
Neutrophils Relative %: 75 %
Platelets: 320 K/uL (ref 150–400)
RBC: 2.91 MIL/uL — ABNORMAL LOW (ref 3.87–5.11)
RDW: 17.1 % — ABNORMAL HIGH (ref 11.5–15.5)
WBC: 3.1 K/uL — ABNORMAL LOW (ref 4.0–10.5)
nRBC: 0 % (ref 0.0–0.2)

## 2024-07-31 LAB — APTT: aPTT: 28 s (ref 24–36)

## 2024-07-31 LAB — PROTIME-INR
INR: 1 (ref 0.8–1.2)
Prothrombin Time: 13.7 s (ref 11.4–15.2)

## 2024-07-31 LAB — URINE DRUG SCREEN
Amphetamines: NEGATIVE
Barbiturates: NEGATIVE
Benzodiazepines: NEGATIVE
Cocaine: NEGATIVE
Fentanyl: POSITIVE — AB
Methadone Scn, Ur: NEGATIVE
Opiates: POSITIVE — AB
Tetrahydrocannabinol: POSITIVE — AB

## 2024-07-31 LAB — TYPE AND SCREEN
ABO/RH(D): O POS
Antibody Screen: NEGATIVE

## 2024-07-31 LAB — PHOSPHORUS: Phosphorus: 2.9 mg/dL (ref 2.5–4.6)

## 2024-07-31 LAB — CBG MONITORING, ED: Glucose-Capillary: 112 mg/dL — ABNORMAL HIGH (ref 70–99)

## 2024-07-31 MED ORDER — ENOXAPARIN SODIUM 60 MG/0.6ML IJ SOSY: 45.0000 mg | PREFILLED_SYRINGE | INTRAMUSCULAR | Status: DC

## 2024-07-31 MED ORDER — DROSPIRENONE 4 MG PO TABS: 1.0000 | ORAL_TABLET | Freq: Every day | ORAL | Status: DC

## 2024-07-31 MED ORDER — PANTOPRAZOLE SODIUM 40 MG IV SOLR
40.0000 mg | Freq: Two times a day (BID) | INTRAVENOUS | Status: DC
  Administered 2024-07-31 – 2024-08-01 (×4): 40 mg via INTRAVENOUS
  Filled 2024-07-31 (×4): qty 10

## 2024-07-31 MED ORDER — METOCLOPRAMIDE HCL 5 MG/ML IJ SOLN
10.0000 mg | Freq: Four times a day (QID) | INTRAMUSCULAR | Status: DC
  Administered 2024-07-31 – 2024-08-01 (×7): 10 mg via INTRAVENOUS
  Filled 2024-07-31 (×7): qty 2

## 2024-07-31 MED ORDER — MORPHINE SULFATE (PF) 2 MG/ML IV SOLN
2.0000 mg | INTRAVENOUS | Status: DC | PRN
  Administered 2024-07-31 – 2024-08-01 (×6): 2 mg via INTRAVENOUS
  Filled 2024-07-31 (×7): qty 1

## 2024-07-31 MED ORDER — GUAIFENESIN 100 MG/5ML PO LIQD: 200.0000 mg | ORAL | Status: DC | PRN

## 2024-07-31 MED ORDER — SODIUM CHLORIDE 0.9 % IV SOLN: INTRAVENOUS | Status: DC

## 2024-07-31 MED ORDER — SODIUM CHLORIDE 0.9 % IV BOLUS: 500.0000 mL | Freq: Once | INTRAVENOUS | Status: DC

## 2024-07-31 MED ORDER — ALBUTEROL SULFATE HFA 108 (90 BASE) MCG/ACT IN AERS: 2.0000 | INHALATION_SPRAY | RESPIRATORY_TRACT | Status: DC | PRN

## 2024-07-31 MED ORDER — OXYCODONE HCL 5 MG PO TABS
5.0000 mg | ORAL_TABLET | Freq: Four times a day (QID) | ORAL | Status: DC | PRN
  Administered 2024-07-31: 5 mg
  Filled 2024-07-31 (×2): qty 1

## 2024-07-31 MED ORDER — LORAZEPAM 2 MG/ML IJ SOLN: 0.5000 mg | Freq: Three times a day (TID) | INTRAMUSCULAR | Status: DC | PRN

## 2024-07-31 MED ORDER — PROMETHAZINE (PHENERGAN) 6.25MG IN NS 50ML IVPB
6.2500 mg | Freq: Once | INTRAVENOUS | Status: AC
  Administered 2024-07-31: 6.25 mg via INTRAVENOUS
  Filled 2024-07-31: qty 6.25

## 2024-07-31 MED ORDER — SODIUM CHLORIDE 0.9 % IV BOLUS
1000.0000 mL | Freq: Once | INTRAVENOUS | Status: AC
  Administered 2024-07-31: 1000 mL via INTRAVENOUS

## 2024-07-31 MED ORDER — MAGNESIUM SULFATE 2 GM/50ML IV SOLN
2.0000 g | Freq: Once | INTRAVENOUS | Status: AC
  Administered 2024-07-31: 2 g via INTRAVENOUS
  Filled 2024-07-31: qty 50

## 2024-07-31 MED ORDER — OSMOLITE 1.5 CAL PO LIQD
237.0000 mL | Freq: Every day | ORAL | Status: DC
  Administered 2024-07-31: 40 mL
  Administered 2024-07-31: 60 mL
  Administered 2024-07-31: 237 mL
  Administered 2024-07-31: 30 mL
  Administered 2024-08-01: 90 mL
  Administered 2024-08-01: 80 mL
  Administered 2024-08-01: 70 mL

## 2024-07-31 MED ORDER — PROMETHAZINE HCL 25 MG PO TABS
25.0000 mg | ORAL_TABLET | Freq: Four times a day (QID) | ORAL | Status: DC | PRN
  Filled 2024-07-31: qty 1

## 2024-07-31 MED ORDER — ACETAMINOPHEN 325 MG PO TABS: 650.0000 mg | ORAL_TABLET | Freq: Four times a day (QID) | ORAL | Status: DC | PRN

## 2024-07-31 MED ORDER — ALPRAZOLAM 0.25 MG PO TABS: 0.2500 mg | ORAL_TABLET | Freq: Every evening | ORAL | Status: DC | PRN

## 2024-07-31 MED ORDER — ALBUTEROL SULFATE (2.5 MG/3ML) 0.083% IN NEBU: 2.5000 mg | INHALATION_SOLUTION | RESPIRATORY_TRACT | Status: DC | PRN

## 2024-07-31 MED ORDER — METOCLOPRAMIDE HCL 5 MG/ML IJ SOLN: 5.0000 mg | Freq: Four times a day (QID) | INTRAMUSCULAR | Status: DC

## 2024-07-31 MED ORDER — HYDRALAZINE HCL 20 MG/ML IJ SOLN: 5.0000 mg | INTRAMUSCULAR | Status: DC | PRN

## 2024-07-31 MED ORDER — AMLODIPINE BESYLATE 5 MG PO TABS
5.0000 mg | ORAL_TABLET | Freq: Every day | ORAL | Status: DC
  Administered 2024-07-31 – 2024-08-01 (×2): 5 mg
  Filled 2024-07-31 (×2): qty 1

## 2024-07-31 MED ORDER — FREE WATER
120.0000 mL | Freq: Every day | Status: DC
  Administered 2024-07-31: 50 mL
  Administered 2024-07-31 – 2024-08-01 (×6): 120 mL
  Filled 2024-07-31 (×5): qty 120

## 2024-07-31 MED ORDER — ONDANSETRON HCL 4 MG/2ML IJ SOLN: 4.0000 mg | Freq: Three times a day (TID) | INTRAMUSCULAR | Status: DC | PRN

## 2024-07-31 MED ORDER — LACTULOSE 10 GM/15ML PO SOLN: 30.0000 g | Freq: Every day | ORAL | Status: DC | PRN

## 2024-07-31 NOTE — Progress Notes (Addendum)
  Progress Note   Patient: Brandi [name] FMW:[id number] DOB: 1996-08-25 DOA: 07/30/2024     0 DOS: the patient was seen and examined on 07/31/2024   Same-day admission note:  28 years old female who was admitted around midnight with intractable nausea and vomiting.  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  She told me that she is supposed to get radiation therapy today and her last chemotherapy was on Wednesday.  I have informed radiation oncologist, Dr. Marcey Penton of patient's admission to the hospital. This morning, she feels better and has no vomiting but is still nauseous. I spoke about restarting her tube feeds and have placed a consult for registered dietitian. She lives at home with her significant other. I also spoke to her about management of her elevated blood pressure and she says that she is supposed to be on amlodipine  but she is noncompliant with it.  Author: Deliliah Room, MD 07/31/2024 9:23 AM  For on call review [email or web address].

## 2024-07-31 NOTE — Progress Notes (Signed)
 PHARMACIST - PHYSICIAN COMMUNICATION  CONCERNING:  Enoxaparin  (Lovenox ) for DVT Prophylaxis    RECOMMENDATION: Patient was prescribed enoxaprin 40mg  q24 hours for VTE prophylaxis.   There were no vitals filed for this visit.  There is no height or weight on file to calculate BMI.  Estimated Creatinine Clearance: 101.5 mL/min (by C-G formula based on SCr of 0.86 mg/dL).   Based on [facility] policy patient is candidate for enoxaparin  0.5mg /kg TBW SQ every 24 hours based on BMI being >30.  DESCRIPTION: Pharmacy has adjusted enoxaparin  dose per [facility] policy.  Patient is now receiving enoxaparin  0.5 mg/kg every 24 hours   Rankin CANDIE Dills, PharmD, [facility] 07/31/2024 12:34 AM

## 2024-07-31 NOTE — H&P (Addendum)
 History and Physical    Brandi [name] FMW:[id number] DOB: [birth year] DOA: 07/30/2024  Referring MD/NP/PA:   PCP: [name], MD   Patient coming from:  The patient is coming from home.     Chief Complaint: Intractable nausea, vomiting  HPI: Brandi [name] is a 28 y.o. 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  Patient was admitted from 1/28 - 11/29 due to intractable nausea vomiting  which was felt to be related chemotherapy.  Patient was treated with supportive care and IV fluid with improvement.  She states that after she went home, she continues to have nausea vomiting which has been progressively worsening.  Patient has had many times of vomiting.  She states that she vomited dark blood twice.  She continues to have nausea and vomiting in ED, but no blood in the vomitus when I saw pt in ED. She had upper abdominal pain earlier, which has resolved.  She has acid reflux symptoms.  No diarrhea.  No abdominal tenderness on examination.  No chest pain, cough, SOB.  No symptoms of UTI.  No fever or chills.  Data reviewed independently and ED Course: pt was found to have WBC 2.0, hemoglobin stable 10.5 (9.5 on 07/29/2024), GFR> 60, magnesium  1.6, UA not impressive, lipase 15, liver function normal except for ALT 138.  Temperature 99.8, blood pressure 152/93, heart rate 110-120s, RR 22, oxygen saturation 100% on room air.  Patient is placed in telemetry bed for observation.   EKG: I have personally reviewed.  Sinus rhythm, QTc 439, T wave inversion in inferior leads and precordial leads, early R wave progression.   Review of Systems:   General: no fevers, chills, no body weight gain, has poor appetite, has fatigue HEENT: no blurry  vision, hearing changes or sore throat Respiratory: no dyspnea, coughing, wheezing CV: no chest pain, no palpitations GI: Has nausea, vomiting, epigastric abdominal pain, hematemesis, no diarrhea GU: no dysuria, burning on urination, increased urinary frequency, hematuria  Ext: no leg edema Neuro: no unilateral weakness, numbness, or tingling, no vision change or hearing loss Skin: no rash, no skin tear. MSK: No muscle spasm, no deformity, no limitation of range of movement in spin Heme: No easy bruising.  Travel history: No recent long distant travel.   Allergy:  Allergies  Allergen Reactions   Shellfish Allergy Anaphylaxis   Tomato Other (See Comments)    Flares up exema    Past Medical History:  Diagnosis Date   Asthma    Cancer (HCC)    head and neck, getting radiation   GERD (gastroesophageal reflux disease)    HTN (hypertension)     Past Surgical History:  Procedure Laterality Date   dental procedure     IR GASTROSTOMY TUBE MOD SED  07/05/2024   IR US  LIVER BIOPSY  05/17/2024    Social History:  reports that she has quit smoking. Her smoking use included e-cigarettes. She has never used smokeless tobacco. She reports that she does not currently use alcohol. She reports current drug use. Drug: Marijuana.  Family History:  Family History  Problem Relation Age of Onset   Hypertension Maternal Grandmother    Diabetes Maternal Grandmother      Prior to Admission medications   Medication Sig Start Date End Date Taking? Authorizing Provider  ALPRAZolam  (XANAX ) 0.25 MG tablet Take 1 tablet (0.25 mg total) by mouth at bedtime as needed for anxiety. 06/15/24   [name], MD  amLODipine  (NORVASC ) 5 MG tablet Place 1 tablet (5 mg total) into feeding tube daily. 07/09/24   Josette Ade, MD  cyclobenzaprine  (FLEXERIL ) 10 MG tablet Take 1 tablet (10 mg total) by mouth 3 (three) times daily as needed for muscle spasms. 06/14/24   [name], MD   fluconazole  (DIFLUCAN ) 200 MG tablet Take 1 tablet (200 mg total) by mouth daily. 07/19/24   [name], MD  lactulose  (CHRONULAC ) 10 GM/15ML solution Take 45 mLs (30 g total) by mouth daily as needed for severe constipation. 07/09/24   Josette Ade, MD  Nutritional Supplements (ENSURE CLEAR) LIQD Take 1 Bottle by mouth 2 (two) times daily. 07/13/24   [name], MD  Nutritional Supplements (FEEDING SUPPLEMENT, OSMOLITE 1.5 CAL,) LIQD Place 119 mLs into feeding tube 6 (six) times daily. 07/09/24   Josette Ade, MD  ondansetron  (ZOFRAN ) 4 MG tablet Place 2 tablets (8 mg total) into feeding tube every 8 (eight) hours as needed for nausea or vomiting. 07/27/24   Levander Slate, MD  pantoprazole  sodium (PROTONIX ) 40 mg Place 40 mg into feeding tube daily for 14 days. 07/24/24 08/07/24  Ernest Ronal BRAVO, MD  phenol (CHLORASEPTIC) 1.4 % LIQD Use as directed 1 spray in the mouth or throat as needed for throat irritation / pain. 06/27/24   Awanda City, MD  potassium chloride  20 MEQ/15ML (10%) SOLN Take 15 mLs (20 mEq total) by mouth daily. 07/17/24   Borders, Fonda SAUNDERS, NP  prochlorperazine  (COMPAZINE ) 10 MG tablet Take 1 tablet (10 mg total) by mouth every 8 (eight) hours as needed for nausea or vomiting. 07/17/24   Borders, Fonda SAUNDERS, NP  SLYND  4 MG TABS Take 1 tablet by mouth daily. 05/20/24   [provider]  Water  For Irrigation, Sterile (FREE WATER ) SOLN Place 60 mLs into feeding tube 6 (six) times daily. 07/09/24   Josette Ade, MD    Physical Exam: Vitals:   07/31/24 0018 07/31/24 0030 07/31/24 0055 07/31/24 0100  BP: (!) 138/98 (!) 143/108  (!) 160/110  Pulse: (!) 130 (!) 120 (!) 142 (!) 129  Resp: 18  11 11   Temp: 99.1 F (37.3 C)     TempSrc:      SpO2: 100% 100% 100% 99%   General: Not in acute distress.  Dry mucous membrane HEENT:       Eyes: PERRL, EOMI, no jaundice       ENT: No discharge from the ears and nose, no pharynx injection, no tonsillar enlargement.         Neck: No JVD, no bruit, no mass felt. Heme: No neck lymph node enlargement. Cardiac: S1/S2, RRR, No murmurs, No gallops or rubs. Respiratory: No rales, wheezing, rhonchi or rubs. GI: Soft, nondistended, nontender, no rebound pain, no organomegaly, BS present.  Has gastrostomy tube in place with clean surroundings GU: No hematuria Ext: No pitting leg edema bilaterally. 1+DP/PT pulse bilaterally. Musculoskeletal: No joint deformities, No joint redness or warmth, no limitation of ROM in spin. Skin: No rashes.  Neuro: Alert, oriented X3, cranial nerves II-XII grossly intact, moves all extremities normally. Psych: Patient is not psychotic, no suicidal or hemocidal ideation.  Labs on Admission: I have personally reviewed following labs and imaging studies  CBC: Recent Labs  Lab 07/26/24 0910 07/27/24 1622 07/28/24 0533 07/29/24 0451 07/30/24 1909  WBC 3.9*  6.2 5.2 2.9* 2.0*  NEUTROABS 3.2 5.7 4.5  --   --   HGB 10.2* 10.4* 9.6* 9.5* 10.5*  HCT 30.2* 30.6* 28.5* 28.9* 30.9*  MCV 90.4 90.5 91.9 93.8 91.7  PLT 491* 493* 425* 372 372   Basic Metabolic Panel: Recent Labs  Lab 07/26/24 0910 07/27/24 1622 07/28/24 0533 07/28/24 0541 07/29/24 0451 07/30/24 1909  NA 135 134* 135  --  134* 136  K 3.6 3.7 3.2*  --  4.0 3.5  CL 101 97* 98  --  100 95*  CO2 25 25 25   --  24 25  GLUCOSE 112* 120* 123*  --  99 118*  BUN 13 13 12   --  7 10  CREATININE 0.84 0.72 0.70  --  0.59 0.86  CALCIUM 9.3 10.0 9.2  --  8.6* 9.7  MG 1.8  --   --  1.6*  --   --   PHOS  --   --   --   --   --  2.9   GFR: Estimated Creatinine Clearance: 101.5 mL/min (by C-G formula based on SCr of 0.86 mg/dL). Liver Function Tests: Recent Labs  Lab 07/27/24 1622 07/28/24 0533 07/30/24 1909  AST 45* 51* 39  ALT 139* 145* 138*  ALKPHOS 74 65 68  BILITOT 0.4 0.3 0.4  PROT 7.4 6.6 6.9  ALBUMIN 4.4 4.0 4.2   Recent Labs  Lab 07/27/24 1622 07/28/24 0533 07/30/24 1909  LIPASE 20 18 15    No results for  input(s): AMMONIA in the last 168 hours. Coagulation Profile: No results for input(s): INR, PROTIME in the last 168 hours. Cardiac Enzymes: No results for input(s): CKTOTAL, CKMB, CKMBINDEX, TROPONINI in the last 168 hours. BNP (last 3 results) No results for input(s): PROBNP in the last 8760 hours. HbA1C: No results for input(s): HGBA1C in the last 72 hours. CBG: No results for input(s): GLUCAP in the last 168 hours. Lipid Profile: No results for input(s): CHOL, HDL, LDLCALC, TRIG, CHOLHDL, LDLDIRECT in the last 72 hours. Thyroid  Function Tests: No results for input(s): TSH, T4TOTAL, FREET4, T3FREE, THYROIDAB in the last 72 hours. Anemia Panel: No results for input(s): VITAMINB12, FOLATE, FERRITIN, TIBC, IRON, RETICCTPCT in the last 72 hours. Urine analysis:    Component Value Date/Time   COLORURINE YELLOW (A) 07/30/2024 1907   APPEARANCEUR HAZY (A) 07/30/2024 1907   LABSPEC 1.018 07/30/2024 1907   PHURINE 9.0 (H) 07/30/2024 1907   GLUCOSEU NEGATIVE 07/30/2024 1907   HGBUR NEGATIVE 07/30/2024 1907   BILIRUBINUR NEGATIVE 07/30/2024 1907   KETONESUR NEGATIVE 07/30/2024 1907   PROTEINUR 100 (A) 07/30/2024 1907   UROBILINOGEN 1.0 05/20/2015 1230   NITRITE NEGATIVE 07/30/2024 1907   LEUKOCYTESUR NEGATIVE 07/30/2024 1907   Sepsis Labs: @LABRCNTIP (procalcitonin:4,lacticidven:4) ) Recent Results (from the past 240 hours)  Blood culture (single)     Status: None   Collection Time: 07/22/24  8:07 PM   Specimen: BLOOD  Result Value Ref Range Status   Specimen Description BLOOD RIGHT ANTECUBITAL  Final   Special Requests   Final    BOTTLES DRAWN AEROBIC AND ANAEROBIC Blood Culture adequate volume   Culture   Final    NO GROWTH 5 DAYS Performed at [facility], [address] [place], [place] [address]    Report Status 07/27/2024 FINAL  Final  Group A Strep by PCR ([facility] Only)     Status: None   Collection Time:  07/24/24  1:20 AM   Specimen: Throat; Sterile Swab  Result Value Ref Range Status   Group A Strep by PCR NOT DETECTED NOT DETECTED Final    Comment: Performed at [facility], [address] [place], [place] [address]  Resp panel by RT-PCR (RSV, Flu A&B, Covid) Anterior Nasal Swab     Status: None   Collection Time: 07/24/24  1:20 AM   Specimen: Anterior Nasal Swab  Result Value Ref Range Status   SARS Coronavirus 2 by RT PCR NEGATIVE NEGATIVE Final    Comment: (NOTE) SARS-CoV-2 target nucleic acids are NOT DETECTED.  The SARS-CoV-2 RNA is generally detectable in upper respiratory specimens during the acute phase of infection. The lowest concentration of SARS-CoV-2 viral copies this assay can detect is 138 copies/mL. A negative result does not preclude SARS-Cov-2 infection and should not be used as the sole basis for treatment or other patient management decisions. A negative result may occur with  improper specimen collection/handling, submission of specimen other than nasopharyngeal swab, presence of viral mutation(s) within the areas targeted by this assay, and inadequate number of viral copies(<138 copies/mL). A negative result must be combined with clinical observations, patient history, and epidemiological information. The expected result is Negative.  Fact Sheet for Patients:  bloggercourse.com  Fact Sheet for Healthcare Providers:  seriousbroker.it  This test is no t yet approved or cleared by the [place]  FDA and  has been authorized for detection and/or diagnosis of SARS-CoV-2 by FDA under an Emergency Use Authorization (EUA). This EUA will remain  in effect (meaning this test can be used) for the duration of the COVID-19 declaration under Section 564(b)(1) of the Act, 21 U.S.C.section 360bbb-3(b)(1), unless the authorization is terminated  or revoked sooner.       Influenza A by PCR NEGATIVE NEGATIVE  Final   Influenza B by PCR NEGATIVE NEGATIVE Final    Comment: (NOTE) The Xpert Xpress SARS-CoV-2/FLU/RSV plus assay is intended as an aid in the diagnosis of influenza from Nasopharyngeal swab specimens and should not be used as a sole basis for treatment. Nasal washings and aspirates are unacceptable for Xpert Xpress SARS-CoV-2/FLU/RSV testing.  Fact Sheet for Patients: bloggercourse.com  Fact Sheet for Healthcare Providers: seriousbroker.it  This test is not yet approved or cleared by the [place]  FDA and has been authorized for detection and/or diagnosis of SARS-CoV-2 by FDA under an Emergency Use Authorization (EUA). This EUA will remain in effect (meaning this test can be used) for the duration of the COVID-19 declaration under Section 564(b)(1) of the Act, 21 U.S.C. section 360bbb-3(b)(1), unless the authorization is terminated or revoked.     Resp Syncytial Virus by PCR NEGATIVE NEGATIVE Final    Comment: (NOTE) Fact Sheet for Patients: bloggercourse.com  Fact Sheet for Healthcare Providers: seriousbroker.it  This test is not yet approved or cleared by the [place]  FDA and has been authorized for detection and/or diagnosis of SARS-CoV-2 by FDA under an Emergency Use Authorization (EUA). This EUA will remain in effect (meaning this test can be used) for the duration of the COVID-19 declaration under Section 564(b)(1) of the Act, 21 U.S.C. section 360bbb-3(b)(1), unless the authorization is terminated or revoked.  Performed at [facility], [address] [place], [place] [address]      Radiological Exams on Admission:   Assessment/Plan Principal Problem:   Intractable nausea and vomiting Active Problems:   HTN (hypertension)   Gastrostomy tube in place [facility])   Hypomagnesemia   Hematemesis   Nasopharyngeal carcinoma (HCC)   Normocytic  anemia  Leukopenia   GERD (gastroesophageal reflux disease)   Asthma   Obesity (BMI 30-39.9)   Assessment and Plan:  Intractable nausea and vomiting: Etiology is not clear, may be related to recent chemotherapy.  Lipase normal 15.  Patient reports upper abdominal pain earlier, but no abdominal tenderness currently on examination.  -Placed in telemetry bed for outpatient - Hold tube feeding temporarily - Scheduled Reglan  5 mg every 6 hours - As needed Phenergan  25 mg every 6 hours - IV fluid: 2 L normal saline, 100 cc/h  -As needed morphine , oxycodone  for abdominal pain - if no improvement, may consider to repeat CT of abdomen/pelvis   HTN (hypertension) -IV hydralazine  as needed - Continue amlodipine   Gastrostomy tube in place [facility]) -Hold Tube feeding now  Hypomagnesemia: Mg 1.6. K 3.5 -give 2 g magnesium  sulfate IV - Check phosphorus level  Hematemesis: Currently no hematemesis while patient in ED. hemoglobin stable 10.5 (9.5 on 07/29/2024) -Protonix  IV 40 mg twice daily - INR/PTT/type screen - Repeat CBC ammonia  Nasopharyngeal carcinoma (HCC): doing daily radiation therapy and cisplatin  chemotherapy, last treatment was on 11/26. - Following up with Dr. Jacobo of oncology  Normocytic anemia: Hgb stable -f/u with CBC  Leukopenia: WBC 2.0, chronic issues, likely related to chemotherapy -Follow up with CBC  GERD (gastroesophageal reflux disease) -IV Protonix  as above  Asthma: Stable -As needed albuterol  and cough syrup  Obesity (BMI 30-39.9): Patient has Obesity Class II, with body weight 89.8 Kg and BMI 36.21 kg/m2.  - Encourage losing weight - Exercise and healthy diet         DVT ppx: SCD  Code Status: Full code   Family Communication:     not done, no family member is at bed side.    Disposition Plan:  Anticipate discharge back to previous environment  Consults called:  none  Admission status and Level of care: Telemetry:    for  obs    Dispo: The patient is from: Home              Anticipated d/c is to: Home              Anticipated d/c date is: 1 day              Patient currently is not medically stable to d/c.    Severity of Illness:  The appropriate patient status for this patient is OBSERVATION. Observation status is judged to be reasonable and necessary in order to provide the required intensity of service to ensure the patient's safety. The patient's presenting symptoms, physical exam findings, and initial radiographic and laboratory data in the context of their medical condition is felt to place them at decreased risk for further clinical deterioration. Furthermore, it is anticipated that the patient will be medically stable for discharge from the hospital within 2 midnights of admission.        Date of Service 07/31/2024    Caleb Exon [facility]   If 7PM-7AM, please contact night-coverage [email or web address] 07/31/2024, 1:12 AM

## 2024-07-31 NOTE — ED Notes (Signed)
 PT is being monitored by CCMD.

## 2024-08-01 ENCOUNTER — Other Ambulatory Visit: Payer: Self-pay

## 2024-08-01 ENCOUNTER — Ambulatory Visit

## 2024-08-01 DIAGNOSIS — R112 Nausea with vomiting, unspecified: Secondary | ICD-10-CM | POA: Insufficient documentation

## 2024-08-01 DIAGNOSIS — C770 Secondary and unspecified malignant neoplasm of lymph nodes of head, face and neck: Secondary | ICD-10-CM | POA: Insufficient documentation

## 2024-08-01 DIAGNOSIS — C119 Malignant neoplasm of nasopharynx, unspecified: Secondary | ICD-10-CM | POA: Insufficient documentation

## 2024-08-01 DIAGNOSIS — Z923 Personal history of irradiation: Secondary | ICD-10-CM | POA: Insufficient documentation

## 2024-08-01 DIAGNOSIS — Z87891 Personal history of nicotine dependence: Secondary | ICD-10-CM | POA: Insufficient documentation

## 2024-08-01 DIAGNOSIS — I10 Essential (primary) hypertension: Secondary | ICD-10-CM | POA: Insufficient documentation

## 2024-08-01 DIAGNOSIS — Z510 Encounter for antineoplastic radiation therapy: Secondary | ICD-10-CM | POA: Insufficient documentation

## 2024-08-01 DIAGNOSIS — K219 Gastro-esophageal reflux disease without esophagitis: Secondary | ICD-10-CM | POA: Insufficient documentation

## 2024-08-01 DIAGNOSIS — R1310 Dysphagia, unspecified: Secondary | ICD-10-CM | POA: Insufficient documentation

## 2024-08-01 DIAGNOSIS — R630 Anorexia: Secondary | ICD-10-CM | POA: Insufficient documentation

## 2024-08-01 DIAGNOSIS — C01 Malignant neoplasm of base of tongue: Secondary | ICD-10-CM | POA: Insufficient documentation

## 2024-08-01 DIAGNOSIS — D649 Anemia, unspecified: Secondary | ICD-10-CM | POA: Insufficient documentation

## 2024-08-01 DIAGNOSIS — R110 Nausea: Secondary | ICD-10-CM | POA: Insufficient documentation

## 2024-08-01 DIAGNOSIS — Z5111 Encounter for antineoplastic chemotherapy: Secondary | ICD-10-CM | POA: Insufficient documentation

## 2024-08-01 DIAGNOSIS — J45909 Unspecified asthma, uncomplicated: Secondary | ICD-10-CM | POA: Insufficient documentation

## 2024-08-01 DIAGNOSIS — Z9221 Personal history of antineoplastic chemotherapy: Secondary | ICD-10-CM | POA: Insufficient documentation

## 2024-08-01 DIAGNOSIS — Z79899 Other long term (current) drug therapy: Secondary | ICD-10-CM | POA: Insufficient documentation

## 2024-08-01 DIAGNOSIS — D72829 Elevated white blood cell count, unspecified: Secondary | ICD-10-CM | POA: Insufficient documentation

## 2024-08-01 DIAGNOSIS — M549 Dorsalgia, unspecified: Secondary | ICD-10-CM | POA: Insufficient documentation

## 2024-08-01 DIAGNOSIS — K5900 Constipation, unspecified: Secondary | ICD-10-CM | POA: Insufficient documentation

## 2024-08-01 DIAGNOSIS — E876 Hypokalemia: Secondary | ICD-10-CM | POA: Insufficient documentation

## 2024-08-01 DIAGNOSIS — D72819 Decreased white blood cell count, unspecified: Secondary | ICD-10-CM | POA: Insufficient documentation

## 2024-08-01 DIAGNOSIS — F419 Anxiety disorder, unspecified: Secondary | ICD-10-CM | POA: Insufficient documentation

## 2024-08-01 DIAGNOSIS — J320 Chronic maxillary sinusitis: Secondary | ICD-10-CM | POA: Insufficient documentation

## 2024-08-01 DIAGNOSIS — Z931 Gastrostomy status: Secondary | ICD-10-CM | POA: Insufficient documentation

## 2024-08-01 DIAGNOSIS — D75839 Thrombocytosis, unspecified: Secondary | ICD-10-CM | POA: Insufficient documentation

## 2024-08-01 DIAGNOSIS — E871 Hypo-osmolality and hyponatremia: Secondary | ICD-10-CM | POA: Insufficient documentation

## 2024-08-01 LAB — BASIC METABOLIC PANEL WITH GFR
Anion gap: 11 (ref 5–15)
BUN: 9 mg/dL (ref 6–20)
CO2: 24 mmol/L (ref 22–32)
Calcium: 8.9 mg/dL (ref 8.9–10.3)
Chloride: 101 mmol/L (ref 98–111)
Creatinine, Ser: 0.66 mg/dL (ref 0.44–1.00)
GFR, Estimated: >60 mL/min (ref >60)
Glucose, Bld: 95 mg/dL (ref 70–99)
Potassium: 3.5 mmol/L (ref 3.5–5.1)
Sodium: 136 mmol/L (ref 135–145)

## 2024-08-01 LAB — RAD ONC ARIA SESSION SUMMARY
Course Elapsed Days: 49
Plan Fractions Treated to Date: 7
Plan Prescribed Dose Per Fraction: 2 Gy
Plan Total Fractions Prescribed: 15
Plan Total Prescribed Dose: 30 Gy
Reference Point Dosage Given to Date: 54 Gy
Reference Point Session Dosage Given: 2 Gy
Session Number: 27

## 2024-08-01 LAB — GLUCOSE, CAPILLARY: Glucose-Capillary: 112 mg/dL — ABNORMAL HIGH (ref 70–99)

## 2024-08-01 MED ORDER — LORAZEPAM 0.5 MG PO TABS: 0.5000 mg | ORAL_TABLET | Freq: Four times a day (QID) | ORAL | 0 refills | Status: AC | PRN

## 2024-08-01 MED ORDER — HYDRALAZINE HCL 20 MG/ML IJ SOLN: 5.0000 mg | INTRAMUSCULAR | Status: DC | PRN

## 2024-08-01 MED ORDER — OXYCODONE HCL 5 MG PO TABS: 5.0000 mg | ORAL_TABLET | Freq: Four times a day (QID) | ORAL | 0 refills | Status: AC | PRN

## 2024-08-01 MED ORDER — METOCLOPRAMIDE HCL 5 MG/5ML PO SOLN: 10.0000 mg | Freq: Four times a day (QID) | ORAL | 1 refills | Status: DC | PRN

## 2024-08-01 MED ORDER — LORAZEPAM 0.5 MG PO TABS: 0.5000 mg | ORAL_TABLET | Freq: Four times a day (QID) | ORAL | Status: DC | PRN

## 2024-08-01 MED ORDER — SODIUM CHLORIDE 0.9 % IV SOLN: INTRAVENOUS | Status: DC

## 2024-08-01 NOTE — Assessment & Plan Note (Addendum)
 Continue symptomatic support with Phenergan  25 mg per tube every 6 hours as needed for nausea, vomiting Strict I's and O's 12/2: Resolved.  Patient was discharged home with Reglan  10 mg every 6 hours as needed for nausea and vomiting

## 2024-08-01 NOTE — Assessment & Plan Note (Signed)
 Home amlodipine  5 mg daily Hydralazine  5 mg IV every 5 hours as needed for SBP greater 165, 5 days ordered

## 2024-08-01 NOTE — Assessment & Plan Note (Signed)
 Tube feed has been resumed

## 2024-08-01 NOTE — Assessment & Plan Note (Signed)
 Continue outpatient follow-up with oncology as appropriate

## 2024-08-01 NOTE — Assessment & Plan Note (Signed)
 -  This complicates overall care and prognosis.

## 2024-08-01 NOTE — Assessment & Plan Note (Signed)
 Magnesium  2 g IV has been given on 12/1 Check magnesium  in the a.m.

## 2024-08-01 NOTE — Progress Notes (Addendum)
 Initial Nutrition Assessment  DOCUMENTATION CODES:   Obesity unspecified  INTERVENTION:   -TF via g-tube:   237 ml Osmolite 1.5 6 times daily   60 ml free water  flush before and after each feeding administration   Tube feeding regimen provides 2130 kcal (100% of needs), 89 grams of protein, and 1086 ml of H2O.  Total free water : 1806 ml daily  -RD forwarded note to [facility] for continuity of care  NUTRITION DIAGNOSIS:   Increased nutrient needs related to cancer and cancer related treatments as evidenced by estimated needs.  GOAL:   Patient will meet greater than or equal to 90% of their needs  MONITOR:   TF tolerance  REASON FOR ASSESSMENT:   Consult Enteral/tube feeding initiation and management  ASSESSMENT:   Brandi [name] is a 28 y.o. 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  Patient admitted with intractable nausea and vomiting likely related to recent chemotherapy.   11/5- s/p g-tube placement  Reviewed I/O's: +2.8 L x 24 hours and +4.8 L since admission  Emesis: 100 ml x 24 hours  Per H&P, patient was admitted from 11/28-11/29/25 intractable nausea vomiting  which was felt to be related chemotherapy. She was treated with IV fluids and discharged home, however, nausea and vomiting worsened when she returned home, including two instances of vomiting dark blood.  Patient currently undergoing daily radiation and cisplatin  chemotherapy. Last chemotherapy was on 08/25/24. Noted that patient does not always take her medications, including amlodipine  for blood pressure.   Spoke with patient at bedside, who is familiar to this RD due to multiple prior admissions. She has multiple complaints today and is  eager to go home. Patient expresses frustration with painful IV site and IV pump beeping; RD silenced pump and made nursing aware.   RD discussed her TF regimen at home. She states that it was unregulated and was titrating boluses up 10 ml every day, starting at 40. She also states she was often only taking TF when she felt hungry. These statements were consistent with outpatient [facility] notes, who has been working with patient to transition to full TF regimen of 6 cans Osmolite 1.5 to meet full nutritional needs. Patient reports that she is tolerating TF and nausea and vomiting have resolved today. She states he is hungry and requesting another feeding. RD reviewed feeding schedule with patient.   Reviewed weight history; patient has experienced a 3% weight loss over the past month, which is not signifciant for time frame.   Findings discussed with RN. RD also forwarded note to [facility] for continuity of Care.   Medications reviewed and include reglan  and sodium chloride  infusion @ 100 ml/hr.   Labs reviewed: CBGS: 112 (inpatient orders for glycemic control are none). Tox screen positive for opiates and tetrahydrocannabinol.   NUTRITION - FOCUSED PHYSICAL EXAM:  Flowsheet Row Most Recent Value  Orbital Region No depletion  Upper Arm Region Mild depletion  Thoracic and Lumbar Region No depletion  Buccal Region No depletion  Temple Region No depletion  Clavicle Bone Region No depletion  Clavicle and Acromion Bone Region No depletion  Scapular Bone Region No depletion  Dorsal Hand No depletion  Patellar Region No depletion  Anterior Thigh Region No depletion  Posterior Calf Region  No depletion  Edema (RD Assessment) Mild  Hair Reviewed  Eyes Reviewed  Mouth Reviewed  Skin Reviewed  Nails Reviewed    Diet Order:   Diet Order             Diet - low sodium heart healthy           Diet NPO time specified  Diet effective now                   EDUCATION  NEEDS:   Education needs have been addressed  Skin:  Skin Assessment: Reviewed RN Assessment  Last BM:  07/31/24  Height:   Ht Readings from Last 1 Encounters:  07/31/24 5' 2 (1.575 m)    Weight:   Wt Readings from Last 1 Encounters:  07/28/24 89.8 kg    Ideal Body Weight:  50 kg  BMI:  Body mass index is 36.21 kg/m.  Estimated Nutritional Needs:   Kcal:  2000-2200  Protein:  90-105 grams  Fluid:  2.0-2.2 L    Margery ORN, RD, LDN, CDCES Registered Dietitian III Certified Diabetes Care and Education Specialist If unable to reach this RD, please use RD Inpatient group chat on secure chat between hours of 8am-4 pm daily

## 2024-08-01 NOTE — Hospital Course (Addendum)
 [name] is a 28 year old female with history of morbid obesity, hypertension, nasopharyngeal carcinoma stage III, gastrostomy tube in place, GERD, asthma.  11/30: Presented to the ED for chief concerns of intractable nausea and vomiting.  12/1: Admitted to [facility] for intractable nausea and vomiting. Rounding hospitalist also discussed resuming tube feeds as patient's vomiting has improved.  12/2: I assumed care of the patient.  Pending registered dietitian recommendation.  Patient still NPO as she gets tube feeds.  Continue IV fluid sodium chloride  1 L bolus.

## 2024-08-01 NOTE — Plan of Care (Signed)

## 2024-08-01 NOTE — Discharge Summary (Signed)
 Physician Discharge Summary   Patient: Brandi [name] MRN: [id number] DOB: July 20, 1996  Admit date:     07/30/2024  Discharge date: 08/01/24  Discharge Physician: Dr. Sherre   PCP: [name], MD   Recommendations at discharge:   With radiation oncology as appropriate Take Reglan  liquid per tube as needed for nausea and vomiting  Discharge Diagnoses: Principal Problem:   Intractable nausea and vomiting Active Problems:   Hematemesis   Hypomagnesemia   Leukopenia   HTN (hypertension)   Gastrostomy tube in place [facility])   Nasopharyngeal carcinoma (HCC)   Normocytic anemia   GERD (gastroesophageal reflux disease)   Asthma   Morbid obesity (HCC)  Resolved Problems:   Obesity (BMI 30-39.9)  Hospital Course:  [name] is a 28 year old female with history of morbid obesity, hypertension, nasopharyngeal carcinoma stage III, gastrostomy tube in place, GERD, asthma.  11/30: Presented to the ED for chief concerns of intractable nausea and vomiting.  12/1: Admitted to [facility] service for intractable nausea and vomiting. Rounding hospitalist also discussed resuming tube feeds as patient's vomiting has improved.  12/2: I assumed care of the patient.  Pending registered dietitian recommendation.  Patient still NPO as she gets tube feeds.  Continue IV fluid sodium chloride  1 L bolus.  Assessment and Plan:  * Intractable nausea and vomiting Continue symptomatic support with Phenergan  25 mg per tube every 6 hours as needed for nausea, vomiting Strict I's and O's 12/2: Resolved.  Patient was discharged home with Reglan  10 mg every 6 hours as needed for nausea and vomiting  Hypomagnesemia Magnesium  2 g IV has been given on 12/1 Check magnesium  in the a.m.  Gastrostomy tube in place [facility]) Tube feed has been resumed  HTN (hypertension) Home amlodipine  5 mg daily Hydralazine  5 mg IV every 5 hours as needed for SBP greater 165, 5 days ordered  Nasopharyngeal  carcinoma (HCC) Continue outpatient follow-up with oncology as appropriate  Morbid obesity (HCC) This complicates overall care and prognosis.      Pain control -   Controlled Substance Reporting System database was reviewed. and patient was instructed, not to drive, operate heavy machinery, perform activities at heights, swimming or participation in water  activities or provide baby-sitting services while on Pain, Sleep and Anxiety Medications; until their outpatient Physician has advised to do so again. Also recommended to not to take more than prescribed Pain, Sleep and Anxiety Medications.  Consultants: Registered dietitian Procedures performed: None indicated Disposition: Home Diet recommendation:  Discharge Diet Orders (From admission, onward)     Start     Ordered   08/01/24 0000  Diet - low sodium heart healthy        08/01/24 1341           NPO tube feed only DISCHARGE MEDICATION: Allergies as of 08/01/2024       Reactions   Shellfish Allergy Anaphylaxis   Tomato Other (See Comments)   Flares up exema        Medication List     STOP taking these medications    ALPRAZolam  0.25 MG tablet Commonly known as: XANAX        TAKE these medications    amLODipine  5 MG tablet Commonly known as: NORVASC  Place 1 tablet (5 mg total) into feeding tube daily.   cyclobenzaprine  10 MG tablet Commonly known as: FLEXERIL  Take 1 tablet (10 mg total) by mouth 3 (three) times daily as needed for muscle spasms.   feeding supplement (OSMOLITE 1.5 CAL) Liqd Place  119 mLs into feeding tube 6 (six) times daily.   Ensure Clear Liqd Take 1 Bottle by mouth 2 (two) times daily.   fluconazole  200 MG tablet Commonly known as: DIFLUCAN  Take 1 tablet (200 mg total) by mouth daily.   free water  Soln Place 60 mLs into feeding tube 6 (six) times daily.   lactulose  10 GM/15ML solution Commonly known as: CHRONULAC  Take 45 mLs (30 g total) by mouth daily as needed for  severe constipation.   LORazepam  0.5 MG tablet Commonly known as: ATIVAN  Place 1 tablet (0.5 mg total) into feeding tube every 6 (six) hours as needed for up to 2 days for anxiety or sleep.   metoCLOPramide  5 MG/5ML solution Commonly known as: REGLAN  Take 10 mLs (10 mg total) by mouth every 6 (six) hours as needed for nausea or vomiting.   ondansetron  4 MG tablet Commonly known as: Zofran  Place 2 tablets (8 mg total) into feeding tube every 8 (eight) hours as needed for nausea or vomiting.   oxyCODONE  5 MG immediate release tablet Commonly known as: Oxy IR/ROXICODONE  Place 1 tablet (5 mg total) into feeding tube every 6 (six) hours as needed for up to 5 days for moderate pain (pain score 4-6).   pantoprazole  sodium 40 mg Commonly known as: PROTONIX  Place 40 mg into feeding tube daily for 14 days.   phenol 1.4 % Liqd Commonly known as: CHLORASEPTIC Use as directed 1 spray in the mouth or throat as needed for throat irritation / pain.   potassium chloride  20 MEQ/15ML (10%) Soln Take 15 mLs (20 mEq total) by mouth daily.   prochlorperazine  10 MG tablet Commonly known as: COMPAZINE  Take 1 tablet (10 mg total) by mouth every 8 (eight) hours as needed for nausea or vomiting.   Slynd  4 MG Tabs Generic drug: Drospirenone  Take 1 tablet by mouth daily.       Discharge Exam:  Temperature is 98.7, respirations 17, heart rate 108, blood pressure 143/111, SpO2 100% on room air.  Physical Exam Constitutional:      Appearance: She is obese.  HENT:     Head: Normocephalic and atraumatic.     Mouth/Throat:     Mouth: Mucous membranes are moist.  Eyes:     Extraocular Movements: Extraocular movements intact.  Cardiovascular:     Rate and Rhythm: Normal rate and regular rhythm.     Heart sounds: Normal heart sounds. No murmur heard. Abdominal:     General: Bowel sounds are normal.     Palpations: Abdomen is soft.     Tenderness: There is no abdominal tenderness.     Comments:  G tube in place. Obese abdomen  Skin:    General: Skin is warm and dry.     Capillary Refill: Capillary refill takes less than 2 seconds.  Neurological:     General: No focal deficit present.     Mental Status: She is alert and oriented to person, place, and time.   Condition at discharge: good  The results of significant diagnostics from this hospitalization (including imaging, microbiology, ancillary and laboratory) are listed below for reference.   Imaging Studies: DG Chest Port 1 View Result Date: 07/24/2024 EXAM: 1 VIEW(S) XRAY OF THE CHEST 07/24/2024 01:58:08 AM COMPARISON: 07/22/2024 CLINICAL HISTORY: SOB (shortness of breath) FINDINGS: LUNGS AND PLEURA: No focal pulmonary opacity. No pleural effusion. No pneumothorax. HEART AND MEDIASTINUM: No acute abnormality of the cardiac and mediastinal silhouettes. BONES AND SOFT TISSUES: No acute osseous abnormality. IMPRESSION: 1.  No acute cardiopulmonary process to explain shortness of breath. Electronically signed by: Oneil Devonshire MD 07/24/2024 02:15 AM EST RP Workstation: [id number]   DG ABDOMEN PEG TUBE LOCATION Result Date: 07/24/2024 EXAM: 1 VIEW XRAY OF THE ABDOMEN 07/24/2024 01:58:08 AM COMPARISON: None available. CLINICAL HISTORY: S/P percutaneous endoscopic gastrostomy (PEG) tube placement (HCC) FINDINGS: BOWEL: Nonobstructive bowel gas pattern. Contrast material opacifies the stomach and duodenal C-loop. SOFT TISSUES: Gastrostomy tube is in place overlying the mid body of the stomach. No opaque urinary calculi. BONES: No acute osseous abnormality. IMPRESSION: 1. Gastrostomy tube appropriately positioned within the stomach with contrast opacifying the stomach and duodenal C-loop. Electronically signed by: Oneil Devonshire MD 07/24/2024 02:13 AM EST RP Workstation: [id number]   DG Chest 2 View Result Date: 07/22/2024 EXAM: 2 VIEW(S) XRAY OF THE CHEST 07/22/2024 08:34:16 PM COMPARISON: 07/03/2024 CLINICAL HISTORY: productive cough, eval  infiltrate FINDINGS: LUNGS AND PLEURA: No focal pulmonary opacity. No pleural effusion. No pneumothorax. HEART AND MEDIASTINUM: No acute abnormality of the cardiac and mediastinal silhouettes. BONES AND SOFT TISSUES: No acute osseous abnormality. IMPRESSION: 1. No acute cardiopulmonary process. Electronically signed by: Elsie Gravely MD 07/22/2024 08:39 PM EST RP Workstation: [id number]   CT ABDOMEN PELVIS W CONTRAST Result Date: 07/07/2024 CLINICAL DATA:  Abdominal pain status post gastrostomy placement EXAM: CT ABDOMEN AND PELVIS WITH CONTRAST TECHNIQUE: Multidetector CT imaging of the abdomen and pelvis was performed using the standard protocol following bolus administration of intravenous contrast. RADIATION DOSE REDUCTION: This exam was performed according to the departmental dose-optimization program which includes automated exposure control, adjustment of the mA and/or kV according to patient size and/or use of iterative reconstruction technique. CONTRAST:  OMNIPAQUE  IOHEXOL  300 MG/ML  SOLN COMPARISON:  CT abdomen and pelvis dated 06/18/2024 FINDINGS: Lower chest: No focal consolidation or pulmonary nodule in the lung bases. No pleural effusion or pneumothorax demonstrated. Partially imaged heart size is normal. Hepatobiliary: No focal hepatic lesions. No intra or extrahepatic biliary ductal dilation. Normal gallbladder. Pancreas: No focal lesions or main ductal dilation. Spleen: Normal in size without focal abnormality. Adrenals/Urinary Tract: No adrenal nodules. No suspicious renal mass, calculi or hydronephrosis. No focal bladder wall thickening. Stomach/Bowel: Percutaneous gastrostomy tube in-situ. T tacks remain, tenting the anterior gastric wall. No evidence of bowel wall thickening, distention, or inflammatory changes. Normal appendix. Vascular/Lymphatic: No significant vascular findings are present. No enlarged abdominal or pelvic lymph nodes. Reproductive: No adnexal masses. Other:  Punctate foci of free air along the anterior midline peritoneum. No free fluid or fluid collection. Musculoskeletal: No acute or abnormal lytic or blastic osseous lesions. IMPRESSION: 1. Percutaneous gastrostomy tube in-situ. 2. Punctate foci of free air along the anterior midline peritoneum, likely related to recent gastrostomy tube placement. Electronically Signed   By: Limin  Xu M.D.   On: 07/07/2024 15:48   IR GASTROSTOMY TUBE MOD SED Result Date: 07/05/2024 INDICATION: 28 year old female with nasopharyngeal carcinoma currently on therapy but struggling with dysphagia. She presents for placement of a percutaneous gastrostomy tube for supplemental nutrition. EXAM: Fluoroscopically guided placement of percutaneous push in balloon retention gastrostomy tube Interventional Radiologist:  Wilkie LOIS Lent, MD MEDICATIONS: 1 mg glucagon  and 4 mg Zofran  administered intravenously by the Radiology nurse ANESTHESIA/SEDATION: Versed  5 mg IV; Fentanyl  100 mcg IV and 1 mg Dilaudid  administered intravenously by the Radiology nurse Moderate Sedation Time:  25 minutes The patient's vital signs and level of consciousness were continuously monitored during the procedure by the interventional radiology nurse under my direct supervision. CONTRAST:  10mL OMNIPAQUE   IOHEXOL  300 MG/ML SOLN FLUOROSCOPY: Radiation exposure index: 26.3 mGy, air kerma COMPLICATIONS: None immediate. PROCEDURE: Informed written consent was obtained from the patient after a thorough discussion of the procedural risks, benefits and alternatives. All questions were addressed. Maximal Sterile Barrier Technique was utilized including caps, mask, sterile gowns, sterile gloves, sterile drape, hand hygiene and skin antiseptic. A timeout was performed prior to the initiation of the procedure. Maximal barrier sterile technique utilized including caps, mask, sterile gowns, sterile gloves, large sterile drape, hand hygiene, and chlorhexadine skin prep. An angled  catheter was advanced over a wire under fluoroscopic guidance through the nose, down the esophagus and into the body of the stomach. The stomach was then insufflated with several 100 ml of air. Fluoroscopy confirmed location of the gastric bubble, as well as inferior displacement of the barium stained colon. Under direct fluoroscopic guidance, two T-tacks were placed, and the anterior gastric wall drawn up against the anterior abdominal wall. Percutaneous access was then obtained into the mid gastric body with an 18 gauge trocar needle. Aspiration of air, and injection of contrast material under fluoroscopy confirmed needle placement. An Amplatz wire was advanced in the gastric body and the access needle was removed. A 10 x 100 mm Athletis balloon was then advanced coaxially through a 18 French balloon retention percutaneous gastrostomy tube. The balloon/gastrostomy tube assembly was then advanced over the wire. The balloon was advanced through the anterior abdominal wall along the percutaneous tract. The balloon was then inflated to full effacement dilating the tract. As the balloon was deflated, the assembly was advanced over the wire and into the stomach carrying the gastrostomy tube into the stomach. The retention balloon was filled with 10 mL saline. Contrast was injected through the tube confirming its location within the stomach. The tube was then secured with the external bumper and capped. The patient will be observed for several hours with the newly placed tube on low wall suction to evaluate for any post procedure complication. The patient tolerated the procedure well, there is no immediate complication. IMPRESSION: Successful placement of an 53 French balloon retention gastrostomy tube. Electronically Signed   By: Wilkie Lent M.D.   On: 07/05/2024 14:29   DG Chest 1 View Result Date: 07/03/2024 EXAM: 1 VIEW(S) XRAY OF THE CHEST 07/03/2024 05:10:42 AM COMPARISON: PA and lateral radiographs of the  chest dated 10/31/2012. CLINICAL HISTORY: worsening weakness FINDINGS: LINES, TUBES AND DEVICES: Monitor wires noted. LUNGS AND PLEURA: No focal pulmonary opacity. No pulmonary edema. No pleural effusion. No pneumothorax. HEART AND MEDIASTINUM: No acute abnormality of the cardiac and mediastinal silhouettes. BONES AND SOFT TISSUES: No acute osseous abnormality. IMPRESSION: 1. No acute process. Electronically signed by: Evalene Coho MD 07/03/2024 05:20 AM EST RP Workstation: [id number]   Microbiology: Results for orders placed or performed during the hospital encounter of 07/23/24  Group A Strep by PCR ([facility] Only)     Status: None   Collection Time: 07/24/24  1:20 AM   Specimen: Throat; Sterile Swab  Result Value Ref Range Status   Group A Strep by PCR NOT DETECTED NOT DETECTED Final    Comment: Performed at [facility], [address] [place], [place] [address]  Resp panel by RT-PCR (RSV, Flu A&B, Covid) Anterior Nasal Swab     Status: None   Collection Time: 07/24/24  1:20 AM   Specimen: Anterior Nasal Swab  Result Value Ref Range Status   SARS Coronavirus 2 by RT PCR NEGATIVE NEGATIVE Final  Comment: (NOTE) SARS-CoV-2 target nucleic acids are NOT DETECTED.  The SARS-CoV-2 RNA is generally detectable in upper respiratory specimens during the acute phase of infection. The lowest concentration of SARS-CoV-2 viral copies this assay can detect is 138 copies/mL. A negative result does not preclude SARS-Cov-2 infection and should not be used as the sole basis for treatment or other patient management decisions. A negative result may occur with  improper specimen collection/handling, submission of specimen other than nasopharyngeal swab, presence of viral mutation(s) within the areas targeted by this assay, and inadequate number of viral copies(<138 copies/mL). A negative result must be combined with clinical observations, patient history, and epidemiological information. The  expected result is Negative.  Fact Sheet for Patients:  bloggercourse.com  Fact Sheet for Healthcare Providers:  seriousbroker.it  This test is no t yet approved or cleared by the [place]  FDA and  has been authorized for detection and/or diagnosis of SARS-CoV-2 by FDA under an Emergency Use Authorization (EUA). This EUA will remain  in effect (meaning this test can be used) for the duration of the COVID-19 declaration under Section 564(b)(1) of the Act, 21 U.S.C.section 360bbb-3(b)(1), unless the authorization is terminated  or revoked sooner.       Influenza A by PCR NEGATIVE NEGATIVE Final   Influenza B by PCR NEGATIVE NEGATIVE Final    Comment: (NOTE) The Xpert Xpress SARS-CoV-2/FLU/RSV plus assay is intended as an aid in the diagnosis of influenza from Nasopharyngeal swab specimens and should not be used as a sole basis for treatment. Nasal washings and aspirates are unacceptable for Xpert Xpress SARS-CoV-2/FLU/RSV testing.  Fact Sheet for Patients: bloggercourse.com  Fact Sheet for Healthcare Providers: seriousbroker.it  This test is not yet approved or cleared by the [place]  FDA and has been authorized for detection and/or diagnosis of SARS-CoV-2 by FDA under an Emergency Use Authorization (EUA). This EUA will remain in effect (meaning this test can be used) for the duration of the COVID-19 declaration under Section 564(b)(1) of the Act, 21 U.S.C. section 360bbb-3(b)(1), unless the authorization is terminated or revoked.     Resp Syncytial Virus by PCR NEGATIVE NEGATIVE Final    Comment: (NOTE) Fact Sheet for Patients: bloggercourse.com  Fact Sheet for Healthcare Providers: seriousbroker.it  This test is not yet approved or cleared by the [place]  FDA and has been authorized for detection and/or  diagnosis of SARS-CoV-2 by FDA under an Emergency Use Authorization (EUA). This EUA will remain in effect (meaning this test can be used) for the duration of the COVID-19 declaration under Section 564(b)(1) of the Act, 21 U.S.C. section 360bbb-3(b)(1), unless the authorization is terminated or revoked.  Performed at [facility], [address] [place], [place] [address]    Labs: CBC: Recent Labs  Lab 07/26/24 0910 07/27/24 1622 07/28/24 0533 07/29/24 0451 07/30/24 1909 07/31/24 0603  WBC 3.9* 6.2 5.2 2.9* 2.0* 3.1*  NEUTROABS 3.2 5.7 4.5  --   --  2.3  HGB 10.2* 10.4* 9.6* 9.5* 10.5* 9.0*  HCT 30.2* 30.6* 28.5* 28.9* 30.9* 26.9*  MCV 90.4 90.5 91.9 93.8 91.7 92.4  PLT 491* 493* 425* 372 372 320   Basic Metabolic Panel: Recent Labs  Lab 07/26/24 0910 07/27/24 1622 07/28/24 0533 07/28/24 0541 07/29/24 0451 07/30/24 1909 07/31/24 0603 08/01/24 0420  NA 135   < > 135  --  134* 136 137 136  K 3.6   < > 3.2*  --  4.0 3.5 3.5 3.5  CL 101   < >  98  --  100 95* 100 101  CO2 25   < > 25  --  24 25 26 24   GLUCOSE 112*   < > 123*  --  99 118* 105* 95  BUN 13   < > 12  --  7 10 10 9   CREATININE 0.84   < > 0.70  --  0.59 0.86 0.72 0.66  CALCIUM 9.3   < > 9.2  --  8.6* 9.7 8.7* 8.9  MG 1.8  --   --  1.6*  --   --   --   --   PHOS  --   --   --   --   --  2.9  --   --    < > = values in this interval not displayed.   Liver Function Tests: Recent Labs  Lab 07/27/24 1622 07/28/24 0533 07/30/24 1909  AST 45* 51* 39  ALT 139* 145* 138*  ALKPHOS 74 65 68  BILITOT 0.4 0.3 0.4  PROT 7.4 6.6 6.9  ALBUMIN 4.4 4.0 4.2   CBG: Recent Labs  Lab 07/31/24 1153 08/01/24 0748  GLUCAP 112* 112*   Discharge time spent: greater than 30 minutes.  Signed: Dr. Sherre [facility] 08/01/2024

## 2024-08-02 ENCOUNTER — Other Ambulatory Visit: Payer: Self-pay

## 2024-08-02 ENCOUNTER — Ambulatory Visit
Admission: RE | Admit: 2024-08-02 | Discharge: 2024-08-02 | Attending: Radiation Oncology | Admitting: Radiation Oncology

## 2024-08-02 ENCOUNTER — Ambulatory Visit

## 2024-08-02 DIAGNOSIS — Z79899 Other long term (current) drug therapy: Secondary | ICD-10-CM | POA: Diagnosis not present

## 2024-08-02 DIAGNOSIS — Z923 Personal history of irradiation: Secondary | ICD-10-CM | POA: Diagnosis not present

## 2024-08-02 DIAGNOSIS — C770 Secondary and unspecified malignant neoplasm of lymph nodes of head, face and neck: Secondary | ICD-10-CM | POA: Diagnosis not present

## 2024-08-02 DIAGNOSIS — I10 Essential (primary) hypertension: Secondary | ICD-10-CM | POA: Diagnosis not present

## 2024-08-02 DIAGNOSIS — Z87891 Personal history of nicotine dependence: Secondary | ICD-10-CM | POA: Diagnosis not present

## 2024-08-02 DIAGNOSIS — C01 Malignant neoplasm of base of tongue: Secondary | ICD-10-CM | POA: Diagnosis not present

## 2024-08-02 DIAGNOSIS — C119 Malignant neoplasm of nasopharynx, unspecified: Secondary | ICD-10-CM | POA: Diagnosis not present

## 2024-08-02 DIAGNOSIS — E876 Hypokalemia: Secondary | ICD-10-CM | POA: Diagnosis not present

## 2024-08-02 DIAGNOSIS — K219 Gastro-esophageal reflux disease without esophagitis: Secondary | ICD-10-CM | POA: Diagnosis not present

## 2024-08-02 DIAGNOSIS — R110 Nausea: Secondary | ICD-10-CM | POA: Diagnosis not present

## 2024-08-02 DIAGNOSIS — E871 Hypo-osmolality and hyponatremia: Secondary | ICD-10-CM | POA: Diagnosis not present

## 2024-08-02 DIAGNOSIS — M549 Dorsalgia, unspecified: Secondary | ICD-10-CM | POA: Diagnosis not present

## 2024-08-02 DIAGNOSIS — D649 Anemia, unspecified: Secondary | ICD-10-CM | POA: Diagnosis not present

## 2024-08-02 DIAGNOSIS — J320 Chronic maxillary sinusitis: Secondary | ICD-10-CM | POA: Diagnosis not present

## 2024-08-02 DIAGNOSIS — R630 Anorexia: Secondary | ICD-10-CM | POA: Diagnosis not present

## 2024-08-02 DIAGNOSIS — D75839 Thrombocytosis, unspecified: Secondary | ICD-10-CM | POA: Diagnosis not present

## 2024-08-02 DIAGNOSIS — K5900 Constipation, unspecified: Secondary | ICD-10-CM | POA: Diagnosis not present

## 2024-08-02 DIAGNOSIS — Z9221 Personal history of antineoplastic chemotherapy: Secondary | ICD-10-CM | POA: Diagnosis not present

## 2024-08-02 DIAGNOSIS — J45909 Unspecified asthma, uncomplicated: Secondary | ICD-10-CM | POA: Diagnosis not present

## 2024-08-02 DIAGNOSIS — R1310 Dysphagia, unspecified: Secondary | ICD-10-CM | POA: Diagnosis not present

## 2024-08-02 DIAGNOSIS — Z510 Encounter for antineoplastic radiation therapy: Secondary | ICD-10-CM | POA: Diagnosis present

## 2024-08-02 DIAGNOSIS — R112 Nausea with vomiting, unspecified: Secondary | ICD-10-CM | POA: Diagnosis not present

## 2024-08-02 DIAGNOSIS — D72829 Elevated white blood cell count, unspecified: Secondary | ICD-10-CM | POA: Diagnosis not present

## 2024-08-02 DIAGNOSIS — Z5111 Encounter for antineoplastic chemotherapy: Secondary | ICD-10-CM | POA: Diagnosis not present

## 2024-08-02 LAB — RAD ONC ARIA SESSION SUMMARY
Course Elapsed Days: 50
Plan Fractions Treated to Date: 8
Plan Prescribed Dose Per Fraction: 2 Gy
Plan Total Fractions Prescribed: 15
Plan Total Prescribed Dose: 30 Gy
Reference Point Dosage Given to Date: 56 Gy
Reference Point Session Dosage Given: 2 Gy
Session Number: 28

## 2024-08-03 ENCOUNTER — Encounter: Payer: Self-pay | Admitting: Oncology

## 2024-08-03 ENCOUNTER — Other Ambulatory Visit: Payer: Self-pay

## 2024-08-03 ENCOUNTER — Telehealth: Payer: Self-pay

## 2024-08-03 ENCOUNTER — Ambulatory Visit

## 2024-08-03 ENCOUNTER — Ambulatory Visit
Admission: RE | Admit: 2024-08-03 | Discharge: 2024-08-03 | Attending: Radiation Oncology | Admitting: Radiation Oncology

## 2024-08-03 ENCOUNTER — Inpatient Hospital Stay

## 2024-08-03 ENCOUNTER — Inpatient Hospital Stay: Admitting: Oncology

## 2024-08-03 VITALS — BP 122/90 | HR 103 | Temp 97.1°F | Resp 20 | Wt 192.6 lb

## 2024-08-03 VITALS — BP 144/103 | HR 95 | Resp 18

## 2024-08-03 DIAGNOSIS — K5900 Constipation, unspecified: Secondary | ICD-10-CM | POA: Insufficient documentation

## 2024-08-03 DIAGNOSIS — Z87891 Personal history of nicotine dependence: Secondary | ICD-10-CM | POA: Insufficient documentation

## 2024-08-03 DIAGNOSIS — J45909 Unspecified asthma, uncomplicated: Secondary | ICD-10-CM | POA: Insufficient documentation

## 2024-08-03 DIAGNOSIS — I10 Essential (primary) hypertension: Secondary | ICD-10-CM | POA: Insufficient documentation

## 2024-08-03 DIAGNOSIS — M549 Dorsalgia, unspecified: Secondary | ICD-10-CM | POA: Insufficient documentation

## 2024-08-03 DIAGNOSIS — Z510 Encounter for antineoplastic radiation therapy: Secondary | ICD-10-CM | POA: Diagnosis not present

## 2024-08-03 DIAGNOSIS — Z931 Gastrostomy status: Secondary | ICD-10-CM | POA: Insufficient documentation

## 2024-08-03 DIAGNOSIS — C119 Malignant neoplasm of nasopharynx, unspecified: Secondary | ICD-10-CM | POA: Diagnosis not present

## 2024-08-03 DIAGNOSIS — Z5111 Encounter for antineoplastic chemotherapy: Secondary | ICD-10-CM | POA: Insufficient documentation

## 2024-08-03 DIAGNOSIS — K219 Gastro-esophageal reflux disease without esophagitis: Secondary | ICD-10-CM | POA: Insufficient documentation

## 2024-08-03 DIAGNOSIS — R5383 Other fatigue: Secondary | ICD-10-CM | POA: Insufficient documentation

## 2024-08-03 DIAGNOSIS — D649 Anemia, unspecified: Secondary | ICD-10-CM | POA: Insufficient documentation

## 2024-08-03 DIAGNOSIS — R531 Weakness: Secondary | ICD-10-CM | POA: Insufficient documentation

## 2024-08-03 DIAGNOSIS — Z79899 Other long term (current) drug therapy: Secondary | ICD-10-CM | POA: Insufficient documentation

## 2024-08-03 DIAGNOSIS — R1310 Dysphagia, unspecified: Secondary | ICD-10-CM | POA: Insufficient documentation

## 2024-08-03 LAB — CBC WITH DIFFERENTIAL (CANCER CENTER ONLY)
Abs Immature Granulocytes: 0.03 K/uL (ref 0.00–0.07)
Basophils Absolute: 0 K/uL (ref 0.0–0.1)
Basophils Relative: 0 %
Eosinophils Absolute: 0 K/uL (ref 0.0–0.5)
Eosinophils Relative: 0 %
HCT: 25.6 % — ABNORMAL LOW (ref 36.0–46.0)
Hemoglobin: 8.7 g/dL — ABNORMAL LOW (ref 12.0–15.0)
Immature Granulocytes: 1 %
Lymphocytes Relative: 3 %
Lymphs Abs: 0.2 K/uL — ABNORMAL LOW (ref 0.7–4.0)
MCH: 31.4 pg (ref 26.0–34.0)
MCHC: 34 g/dL (ref 30.0–36.0)
MCV: 92.4 fL (ref 80.0–100.0)
Monocytes Absolute: 0.4 K/uL (ref 0.1–1.0)
Monocytes Relative: 9 %
Neutro Abs: 4 K/uL (ref 1.7–7.7)
Neutrophils Relative %: 87 %
Platelet Count: 210 K/uL (ref 150–400)
RBC: 2.77 MIL/uL — ABNORMAL LOW (ref 3.87–5.11)
RDW: 17.6 % — ABNORMAL HIGH (ref 11.5–15.5)
WBC Count: 4.6 K/uL (ref 4.0–10.5)
nRBC: 0.4 % — ABNORMAL HIGH (ref 0.0–0.2)

## 2024-08-03 LAB — BASIC METABOLIC PANEL - CANCER CENTER ONLY
Anion gap: 11 (ref 5–15)
BUN: 10 mg/dL (ref 6–20)
CO2: 28 mmol/L (ref 22–32)
Calcium: 9.9 mg/dL (ref 8.9–10.3)
Chloride: 98 mmol/L (ref 98–111)
Creatinine: 0.8 mg/dL (ref 0.44–1.00)
GFR, Estimated: >60 mL/min (ref >60)
Glucose, Bld: 139 mg/dL — ABNORMAL HIGH (ref 70–99)
Potassium: 3.7 mmol/L (ref 3.5–5.1)
Sodium: 137 mmol/L (ref 135–145)

## 2024-08-03 LAB — PREGNANCY, URINE: Preg Test, Ur: NEGATIVE

## 2024-08-03 LAB — RAD ONC ARIA SESSION SUMMARY
Course Elapsed Days: 51
Plan Fractions Treated to Date: 9
Plan Prescribed Dose Per Fraction: 2 Gy
Plan Total Fractions Prescribed: 15
Plan Total Prescribed Dose: 30 Gy
Reference Point Dosage Given to Date: 58 Gy
Reference Point Session Dosage Given: 2 Gy
Session Number: 29

## 2024-08-03 LAB — MAGNESIUM: Magnesium: 1.6 mg/dL — ABNORMAL LOW (ref 1.7–2.4)

## 2024-08-03 MED ORDER — PANTOPRAZOLE SODIUM 40 MG PO PACK: 40.0000 mg | PACK | Freq: Every day | ORAL | 0 refills | Status: DC

## 2024-08-03 MED ORDER — PALONOSETRON HCL INJECTION 0.25 MG/5ML
0.2500 mg | Freq: Once | INTRAVENOUS | Status: AC
  Administered 2024-08-03: 0.25 mg via INTRAVENOUS
  Filled 2024-08-03: qty 5

## 2024-08-03 MED ORDER — SODIUM CHLORIDE 0.9 % IV SOLN
40.0000 mg/m2 | Freq: Once | INTRAVENOUS | Status: AC
  Administered 2024-08-03: 84 mg via INTRAVENOUS
  Filled 2024-08-03: qty 84

## 2024-08-03 MED ORDER — MAGNESIUM SULFATE 2 GM/50ML IV SOLN
2.0000 g | Freq: Once | INTRAVENOUS | Status: AC
  Administered 2024-08-03: 2 g via INTRAVENOUS
  Filled 2024-08-03: qty 50

## 2024-08-03 MED ORDER — LORAZEPAM 2 MG/ML IJ SOLN
1.0000 mg | Freq: Once | INTRAMUSCULAR | Status: AC
  Administered 2024-08-03: 1 mg via INTRAVENOUS
  Filled 2024-08-03: qty 1

## 2024-08-03 MED ORDER — AMLODIPINE BESYLATE 5 MG PO TABS: 5.0000 mg | ORAL_TABLET | Freq: Every day | ORAL | 0 refills | Status: AC

## 2024-08-03 MED ORDER — DEXAMETHASONE SOD PHOSPHATE PF 10 MG/ML IJ SOLN
10.0000 mg | Freq: Once | INTRAMUSCULAR | Status: AC
  Administered 2024-08-03: 10 mg via INTRAVENOUS

## 2024-08-03 MED ORDER — APREPITANT 130 MG/18ML IV EMUL
130.0000 mg | Freq: Once | INTRAVENOUS | Status: AC
  Administered 2024-08-03: 130 mg via INTRAVENOUS
  Filled 2024-08-03: qty 18

## 2024-08-03 MED ORDER — SODIUM CHLORIDE 0.9 % IV SOLN
INTRAVENOUS | Status: DC
  Filled 2024-08-03: qty 250

## 2024-08-03 MED ORDER — POTASSIUM CHLORIDE IN NACL 20-0.9 MEQ/L-% IV SOLN
Freq: Once | INTRAVENOUS | Status: AC
  Filled 2024-08-03: qty 1000

## 2024-08-03 NOTE — Progress Notes (Signed)
 Pt only able to void 175cc of required 200cc prior to start of cisplatin .  Per Dr Jacobo okay to proceed.  No additional IVF required.    Disatolic 100's post treatment.  Pt states she has not taken norvasc  today, plans to pick up after treatment.  Pt has no complaints/concerns.

## 2024-08-03 NOTE — Progress Notes (Unsigned)
 [facility]  [phone] [phone]  ID: Brandi [name] OB: Oct 08, 1995  MR#: [id number]  RDW#:[id number]  Patient Care Team: [name], MD as PCP - General (Internal Medicine) [name], MD as Consulting Physician (Radiation Oncology) [name], MD as Consulting Physician (Oncology)  CHIEF COMPLAINT: Stage III EBV positive nasopharyngeal carcinoma.  INTERVAL HISTORY: Patient returns to clinic today for repeat laboratory, further evaluation, and consideration of cycle 6 of weekly cisplatin .  She also continues daily XRT. She continues to have significant anxiety.  Her nausea and vomiting have improved and she is tolerating her tube feeds better.  Significant dysphagia and reports nearly 100% of her nutrition comes by her PEG tube. She has no neurologic complaints.  She denies any recent fevers.  She has no chest pain, shortness of breath, cough, or hemoptysis.  She denies any vomiting or diarrhea.  She has no urinary complaints.  Patient offers no further specific complaints today.  REVIEW OF SYSTEMS:   Review of Systems  Constitutional:  Positive for malaise/fatigue. Negative for chills and fever.  HENT:  Negative for sore throat.   Respiratory: Negative.  Negative for cough, hemoptysis and shortness of breath.   Cardiovascular: Negative.  Negative for chest pain and leg swelling.  Gastrointestinal: Negative.  Negative for abdominal pain, constipation, nausea and vomiting.  Genitourinary: Negative.  Negative for dysuria.  Musculoskeletal: Negative.  Negative for back pain.  Skin: Negative.  Negative for rash.  Neurological:  Positive for weakness. Negative for dizziness, focal weakness and headaches.  Psychiatric/Behavioral:  The patient is nervous/anxious.     As per HPI. Otherwise, a complete review of systems is negative.  PAST MEDICAL HISTORY: Past Medical History:  Diagnosis Date   Asthma    Cancer (HCC)    head  and neck, getting radiation   GERD (gastroesophageal reflux disease)    HTN (hypertension)     PAST SURGICAL HISTORY: Past Surgical History:  Procedure Laterality Date   dental procedure     IR GASTROSTOMY TUBE MOD SED  07/05/2024   IR US  LIVER BIOPSY  05/17/2024    FAMILY HISTORY: Family History  Problem Relation Age of Onset   Hypertension Maternal Grandmother    Diabetes Maternal Grandmother     ADVANCED DIRECTIVES (Y/N):  N  HEALTH MAINTENANCE: Social History   Tobacco Use   Smoking status: Former    Types: E-cigarettes   Smokeless tobacco: Never  Vaping Use   Vaping status: Former  Substance Use Topics   Alcohol use: Not Currently    Comment: social   Drug use: Yes    Types: Marijuana     Colonoscopy:  PAP:  Bone density:  Lipid panel:  Allergies  Allergen Reactions   Shellfish Allergy Anaphylaxis   Tomato Other (See Comments)    Flares up exema    Current Outpatient Medications  Medication Sig Dispense Refill   cyclobenzaprine  (FLEXERIL ) 10 MG tablet Take 1 tablet (10 mg total) by mouth 3 (three) times daily as needed for muscle spasms. 90 tablet 0   lactulose  (CHRONULAC ) 10 GM/15ML solution Take 45 mLs (30 g total) by mouth daily as needed for severe constipation. 473 mL 0   LORazepam  (ATIVAN ) 0.5 MG tablet Place 1 tablet (0.5 mg total) into feeding tube every 6 (six) hours as needed for up to 2 days for anxiety or sleep. 6 tablet 0   metoCLOPramide  (REGLAN ) 5 MG/5ML solution Take 10 mLs (10 mg total) by mouth every 6 (six)  hours as needed for nausea or vomiting. 120 mL 1   Nutritional Supplements (ENSURE CLEAR) LIQD Take 1 Bottle by mouth 2 (two) times daily. 296 mL 6   Nutritional Supplements (FEEDING SUPPLEMENT, OSMOLITE 1.5 CAL,) LIQD Place 119 mLs into feeding tube 6 (six) times daily.     ondansetron  (ZOFRAN ) 4 MG tablet Place 2 tablets (8 mg total) into feeding tube every 8 (eight) hours as needed for nausea or vomiting. 30 tablet 0   oxyCODONE   (OXY IR/ROXICODONE ) 5 MG immediate release tablet Place 1 tablet (5 mg total) into feeding tube every 6 (six) hours as needed for up to 5 days for moderate pain (pain score 4-6). 20 tablet 0   phenol (CHLORASEPTIC) 1.4 % LIQD Use as directed 1 spray in the mouth or throat as needed for throat irritation / pain.     potassium chloride  20 MEQ/15ML (10%) SOLN Take 15 mLs (20 mEq total) by mouth daily. 473 mL 0   prochlorperazine  (COMPAZINE ) 10 MG tablet Take 1 tablet (10 mg total) by mouth every 8 (eight) hours as needed for nausea or vomiting. 30 tablet 0   SLYND  4 MG TABS Take 1 tablet by mouth daily.     Water  For Irrigation, Sterile (FREE WATER ) SOLN Place 60 mLs into feeding tube 6 (six) times daily.     amLODipine  (NORVASC ) 5 MG tablet Place 1 tablet (5 mg total) into feeding tube daily. 30 tablet 0   fluconazole  (DIFLUCAN ) 200 MG tablet Take 1 tablet (200 mg total) by mouth daily. (Patient not taking: Reported on 08/03/2024) 7 tablet 0   pantoprazole  sodium (PROTONIX ) 40 mg Place 40 mg into feeding tube daily for 14 days. 14 each 0   No current facility-administered medications for this visit.   Facility-Administered Medications Ordered in Other Visits  Medication Dose Route Frequency Provider Last Rate Last Admin   0.9 %  sodium chloride  infusion   Intravenous Continuous [name], MD 10 mL/hr at 07/13/24 1004 New Bag at 07/13/24 1004    OBJECTIVE: Vitals:   08/03/24 0907 08/03/24 0923  BP: (!) 125/100 (!) 122/90  Pulse: (!) 103   Resp: 20   Temp: (!) 97.1 F (36.2 C)   SpO2: 100%        Body mass index is 35.23 kg/m.    ECOG FS:1 - Symptomatic but completely ambulatory  General: Well-developed, well-nourished, no acute distress. Eyes: Pink conjunctiva, anicteric sclera. HEENT: Normocephalic, moist mucous membranes. Lungs: No audible wheezing or coughing. Heart: Regular rate and rhythm. Abdomen: Soft, nontender, no obvious distention. Musculoskeletal: No edema,  cyanosis, or clubbing. Neuro: Alert, answering all questions appropriately. Cranial nerves grossly intact. Skin: No rashes or petechiae noted. Psych: Normal affect.  LAB RESULTS:  Lab Results  Component Value Date   NA 137 08/03/2024   K 3.7 08/03/2024   CL 98 08/03/2024   CO2 28 08/03/2024   GLUCOSE 139 (H) 08/03/2024   BUN 10 08/03/2024   CREATININE 0.80 08/03/2024   CALCIUM 9.9 08/03/2024   PROT 6.9 07/30/2024   ALBUMIN 4.2 07/30/2024   AST 39 07/30/2024   ALT 138 (H) 07/30/2024   ALKPHOS 68 07/30/2024   BILITOT 0.4 07/30/2024   GFRNONAA >60 08/03/2024   GFRAA >60 12/05/2019    Lab Results  Component Value Date   WBC 4.6 08/03/2024   NEUTROABS 4.0 08/03/2024   HGB 8.7 (L) 08/03/2024   HCT 25.6 (L) 08/03/2024   MCV 92.4 08/03/2024   PLT 210 08/03/2024  STUDIES: DG Chest Port 1 View Result Date: 07/24/2024 EXAM: 1 VIEW(S) XRAY OF THE CHEST 07/24/2024 01:58:08 AM COMPARISON: 07/22/2024 CLINICAL HISTORY: SOB (shortness of breath) FINDINGS: LUNGS AND PLEURA: No focal pulmonary opacity. No pleural effusion. No pneumothorax. HEART AND MEDIASTINUM: No acute abnormality of the cardiac and mediastinal silhouettes. BONES AND SOFT TISSUES: No acute osseous abnormality. IMPRESSION: 1. No acute cardiopulmonary process to explain shortness of breath. Electronically signed by: Oneil Devonshire MD 07/24/2024 02:15 AM EST RP Workstation: [id number]   DG ABDOMEN PEG TUBE LOCATION Result Date: 07/24/2024 EXAM: 1 VIEW XRAY OF THE ABDOMEN 07/24/2024 01:58:08 AM COMPARISON: None available. CLINICAL HISTORY: S/P percutaneous endoscopic gastrostomy (PEG) tube placement (HCC) FINDINGS: BOWEL: Nonobstructive bowel gas pattern. Contrast material opacifies the stomach and duodenal C-loop. SOFT TISSUES: Gastrostomy tube is in place overlying the mid body of the stomach. No opaque urinary calculi. BONES: No acute osseous abnormality. IMPRESSION: 1. Gastrostomy tube appropriately positioned within the  stomach with contrast opacifying the stomach and duodenal C-loop. Electronically signed by: Oneil Devonshire MD 07/24/2024 02:13 AM EST RP Workstation: [id number]   DG Chest 2 View Result Date: 07/22/2024 EXAM: 2 VIEW(S) XRAY OF THE CHEST 07/22/2024 08:34:16 PM COMPARISON: 07/03/2024 CLINICAL HISTORY: productive cough, eval infiltrate FINDINGS: LUNGS AND PLEURA: No focal pulmonary opacity. No pleural effusion. No pneumothorax. HEART AND MEDIASTINUM: No acute abnormality of the cardiac and mediastinal silhouettes. BONES AND SOFT TISSUES: No acute osseous abnormality. IMPRESSION: 1. No acute cardiopulmonary process. Electronically signed by: Elsie Gravely MD 07/22/2024 08:39 PM EST RP Workstation: [id number]   CT ABDOMEN PELVIS W CONTRAST Result Date: 07/07/2024 CLINICAL DATA:  Abdominal pain status post gastrostomy placement EXAM: CT ABDOMEN AND PELVIS WITH CONTRAST TECHNIQUE: Multidetector CT imaging of the abdomen and pelvis was performed using the standard protocol following bolus administration of intravenous contrast. RADIATION DOSE REDUCTION: This exam was performed according to the departmental dose-optimization program which includes automated exposure control, adjustment of the mA and/or kV according to patient size and/or use of iterative reconstruction technique. CONTRAST:  OMNIPAQUE  IOHEXOL  300 MG/ML  SOLN COMPARISON:  CT abdomen and pelvis dated 06/18/2024 FINDINGS: Lower chest: No focal consolidation or pulmonary nodule in the lung bases. No pleural effusion or pneumothorax demonstrated. Partially imaged heart size is normal. Hepatobiliary: No focal hepatic lesions. No intra or extrahepatic biliary ductal dilation. Normal gallbladder. Pancreas: No focal lesions or main ductal dilation. Spleen: Normal in size without focal abnormality. Adrenals/Urinary Tract: No adrenal nodules. No suspicious renal mass, calculi or hydronephrosis. No focal bladder wall thickening. Stomach/Bowel: Percutaneous  gastrostomy tube in-situ. T tacks remain, tenting the anterior gastric wall. No evidence of bowel wall thickening, distention, or inflammatory changes. Normal appendix. Vascular/Lymphatic: No significant vascular findings are present. No enlarged abdominal or pelvic lymph nodes. Reproductive: No adnexal masses. Other: Punctate foci of free air along the anterior midline peritoneum. No free fluid or fluid collection. Musculoskeletal: No acute or abnormal lytic or blastic osseous lesions. IMPRESSION: 1. Percutaneous gastrostomy tube in-situ. 2. Punctate foci of free air along the anterior midline peritoneum, likely related to recent gastrostomy tube placement. Electronically Signed   By: Limin  Xu M.D.   On: 07/07/2024 15:48   IR GASTROSTOMY TUBE MOD SED Result Date: 07/05/2024 INDICATION: 28 year old female with nasopharyngeal carcinoma currently on therapy but struggling with dysphagia. She presents for placement of a percutaneous gastrostomy tube for supplemental nutrition. EXAM: Fluoroscopically guided placement of percutaneous push in balloon retention gastrostomy tube Interventional Radiologist:  Wilkie LOIS Lent, MD MEDICATIONS: 1 mg glucagon   and 4 mg Zofran  administered intravenously by the Radiology nurse ANESTHESIA/SEDATION: Versed  5 mg IV; Fentanyl  100 mcg IV and 1 mg Dilaudid  administered intravenously by the Radiology nurse Moderate Sedation Time:  25 minutes The patient's vital signs and level of consciousness were continuously monitored during the procedure by the interventional radiology nurse under my direct supervision. CONTRAST:  10mL OMNIPAQUE  IOHEXOL  300 MG/ML SOLN FLUOROSCOPY: Radiation exposure index: 26.3 mGy, air kerma COMPLICATIONS: None immediate. PROCEDURE: Informed written consent was obtained from the patient after a thorough discussion of the procedural risks, benefits and alternatives. All questions were addressed. Maximal Sterile Barrier Technique was utilized including caps,  mask, sterile gowns, sterile gloves, sterile drape, hand hygiene and skin antiseptic. A timeout was performed prior to the initiation of the procedure. Maximal barrier sterile technique utilized including caps, mask, sterile gowns, sterile gloves, large sterile drape, hand hygiene, and chlorhexadine skin prep. An angled catheter was advanced over a wire under fluoroscopic guidance through the nose, down the esophagus and into the body of the stomach. The stomach was then insufflated with several 100 ml of air. Fluoroscopy confirmed location of the gastric bubble, as well as inferior displacement of the barium stained colon. Under direct fluoroscopic guidance, two T-tacks were placed, and the anterior gastric wall drawn up against the anterior abdominal wall. Percutaneous access was then obtained into the mid gastric body with an 18 gauge trocar needle. Aspiration of air, and injection of contrast material under fluoroscopy confirmed needle placement. An Amplatz wire was advanced in the gastric body and the access needle was removed. A 10 x 100 mm Athlet[employer] balloon was then advanced coaxially through a 18 French balloon retention percutaneous gastrostomy tube. The balloon/gastrostomy tube assembly was then advanced over the wire. The balloon was advanced through the anterior abdominal wall along the percutaneous tract. The balloon was then inflated to full effacement dilating the tract. As the balloon was deflated, the assembly was advanced over the wire and into the stomach carrying the gastrostomy tube into the stomach. The retention balloon was filled with 10 mL saline. Contrast was injected through the tube confirming its location within the stomach. The tube was then secured with the external bumper and capped. The patient will be observed for several hours with the newly placed tube on low wall suction to evaluate for any post procedure complication. The patient tolerated the procedure well, there is no  immediate complication. IMPRESSION: Successful placement of an 24 French balloon retention gastrostomy tube. Electronically Signed   By: Wilkie Lent M.D.   On: 07/05/2024 14:29    ASSESSMENT: Stage III EBV positive nasopharyngeal carcinoma.  PLAN:    Stage III EBV positive nasopharyngeal carcinoma: PET scan results from May 26, 2024 reviewed independently confirming stage of disease.  Patient will benefit from concurrent chemotherapy using weekly cisplatin  along with daily XRT.  We discussed the possibility of port placement, but patient previously declined.  Proceed with cycle 6 of weekly cisplatin  today.  Continue daily XRT completing on August 10, 2024.  Return to clinic in 1 week further evaluation and consideration of cycle 7.   Poor appetite/dysphagia: Patient now has PEG tube in place.  Appreciate dietary input.  Continue tube feeds as prescribed.   Anxiety/insomnia: Continue Xanax  as prescribed.  Patient has been instructed to take 0.5 mg nightly. Nausea: Improved.  Continue Zofran  and Compazine  as prescribed.  Patient also received IV antiemetics in clinic today. Constipation: Patient does not complain of this today.  Continue stool softeners.  Back pain: Patient does not complain of this today.  Continue Flexeril  as needed. Leukopenia: Chronic and unchanged.  Patient's total white blood cell count is 3.9. Anemia: Chronic and unchanged.  Patient's hemoglobin is 10.2. Thrombocytosis: Likely reactive, monitor. Dysphagia: Continue Magic mouthwash as prescribed.  PEG tube as above.   Patient expressed understanding and was in agreement with this plan. She also understands that She can call clinic at any time with any questions, concerns, or complaints.    Cancer Staging  Nasopharyngeal carcinoma [facility]) Staging form: Pharynx - Nasopharynx, AJCC V9 - Clinical stage from 05/30/2024: Stage III (cTX, cN3, cM0) - Signed by [name], MD on 05/30/2024 Stage prefix: Initial  diagnosis Method of lymph node assessment: Clinical   Evalene PARAS Jacobo, MD   08/03/2024 9:37 AM

## 2024-08-03 NOTE — Telephone Encounter (Signed)
 Informed PCP to f/u with patient to continuing care for monitoring of BP per oncologist request

## 2024-08-03 NOTE — Telephone Encounter (Signed)
 Called to inform patient she left radiation without attending MD appointment, no answer left message

## 2024-08-03 NOTE — Progress Notes (Signed)
 [facility] CSW Progress Note  Clinical Social Work Intern met with patient in infusion suite to follow-up on need for community resources and emotional support.Patient reports she has been in the hospital again with uncontrolled nausea and vomiting. Intern provided empathetic listening and encouragement for difficulties pt is facing.    Interventions: Provided brief mental health counseling with regard to ongoing strain of illness, treatment side effects, and life changes.  Confirmed that patient has completed disability application through [facility]. Patient has not been able to schedule rides through [facility] due to an outdated phone number being associated with the account. Intern encouraged pt to call Medicaid Support line, listed on the back of insurance card, to update phone number so that she can schedule rides.      Follow Up Plan:  CSW Intern will call pt this week to schedule virtual visit next week.    Thersia KATHEE Daring Clinical Social Worker [facility]    Patient is participating in a Managed Medicaid Plan:  Yes

## 2024-08-03 NOTE — Progress Notes (Signed)
 Pt reported nausea upon start of visit.Okay to give premeds prior to prehydration fluids per Dr Jacobo.    Okay to run post hydration fluids with cisplatin  per Dr Jacobo.

## 2024-08-03 NOTE — Progress Notes (Signed)
 Nutrition Follow-up:  Patient with stage III EBV positive nasopharyngeal carcinoma.  Receiving cisplatin  and radiation.  Followed by Dr Jacobo.  PEG (18 Fr) placed on 11/5, multiple ED visits following each chemotherapy treatment.  Most recent admission on 11/30-12/2 for nausea and vomiting.    Met with patient during infusion.  Reports that she is feeling better but vomited last night.   Ate a few bites of jello off lunch tray today at the cancer center.  Reports no taste (expected side effect from radiation).  Reports that she is drinking gingerale and gatorade by mouth.  Reports 2 bowel movements yesterday.  She is not taking lactulose  anymore due 3+ liquid stools/day.  Reports continued thick saliva making her feel nauseated as well.  She says that she was unable to afford the protonix  that was precribed in the hospital but has been able to take the reglan .     Medications: ativan , zofran , protonix , compazine , reglan   Labs: Mag 1.6 (supplemented) Phosphorus on 11/30 2.9 K normal  Anthropometrics:   Weight 192 lb 9.6 oz today  191 lb 3.2 oz on 11/26 195 lb on 11/26 197 lb on 11/13 207 lb on 11/9 204 lb 2 oz on 10/29 210 lb on 10/22 219 lb on 10/19 216 lb on 8/21 235 lb on 02/29/24   Estimated Energy Needs  Kcals: 2000-2300 Protein: 95-114 g Fluid: > 2 L  NUTRITION DIAGNOSIS: Inadequate oral intake continues   MALNUTRITION DIAGNOSIS: severe malnutrition continues   INTERVENTION:  Recommend trial of [employer] 1.4 formula to see if better tolerated.  Samples of formula given for 5 day trial.   Written instructions given for patient to give 1 carton 5 times a day (8am, 11 am, 2pm, 5pm and 8pm).  Flush with 60ml of water  before and after each feeding.  Patient to drink fluids between feedings.  Continue antinausea medications due to side effects from chemotherapy.  Reviewed strategies to help with thick mucous/saliva.  Encouraged baking soda salt water  rinse.  Written  instructions given.      MONITORING, EVALUATION, GOAL: weight trends, intake   NEXT VISIT: phone call Dec 9 (Tuesday)  Itzelle Gains B. Dasie SOLON, CSO, LDN Registered Dietitian [phone]

## 2024-08-03 NOTE — Patient Instructions (Signed)
 [facility] - A DEPT OF [facility]  Discharge Instructions: Thank you for choosing [facility] to provide your oncology and hematology care.  If you have a lab appointment with the Cancer Center, please go directly to the Cancer Center and check in at the registration area.  Wear comfortable clothing and clothing appropriate for easy access to any Portacath or PICC line.   We strive to give you quality time with your provider. You may need to reschedule your appointment if you arrive late (15 or more minutes).  Arriving late affects you and other patients whose appointments are after yours.  Also, if you miss three or more appointments without notifying the office, you may be dismissed from the clinic at the provider's discretion.      For prescription refill requests, have your pharmacy contact our office and allow 72 hours for refills to be completed.    Today you received the following chemotherapy and/or immunotherapy agents Cisplatin      To help prevent nausea and vomiting after your treatment, we encourage you to take your nausea medication as directed.  BELOW ARE SYMPTOMS THAT SHOULD BE REPORTED IMMEDIATELY: *FEVER GREATER THAN 100.4 F (38 C) OR HIGHER *CHILLS OR SWEATING *NAUSEA AND VOMITING THAT IS NOT CONTROLLED WITH YOUR NAUSEA MEDICATION *UNUSUAL SHORTNESS OF BREATH *UNUSUAL BRUISING OR BLEEDING *URINARY PROBLEMS (pain or burning when urinating, or frequent urination) *BOWEL PROBLEMS (unusual diarrhea, constipation, pain near the anus) TENDERNESS IN MOUTH AND THROAT WITH OR WITHOUT PRESENCE OF ULCERS (sore throat, sores in mouth, or a toothache) UNUSUAL RASH, SWELLING OR PAIN  UNUSUAL VAGINAL DISCHARGE OR ITCHING   Items with * indicate a potential emergency and should be followed up as soon as possible or go to the Emergency Department if any problems should occur.  Please show the CHEMOTHERAPY ALERT CARD or IMMUNOTHERAPY  ALERT CARD at check-in to the Emergency Department and triage nurse.  Should you have questions after your visit or need to cancel or reschedule your appointment, please contact [facility] - A DEPT OF [facility]  [phone] and follow the prompts.  Office hours are 8:00 a.m. to 4:30 p.m. Monday - Friday. Please note that voicemails left after 4:00 p.m. may not be returned until the following business day.  We are closed weekends and major holidays. You have access to a nurse at all times for urgent questions. Please call the main number to the clinic [phone] and follow the prompts.  For any non-urgent questions, you may also contact your provider using MyChart. We now offer e-Visits for anyone 83 and older to request care online for non-urgent symptoms. For details visit [email or web address].   Also download the MyChart app! Go to the app store, search "MyChart", open the app, select [facility], and log in with your MyChart username and password.

## 2024-08-04 ENCOUNTER — Ambulatory Visit

## 2024-08-04 ENCOUNTER — Other Ambulatory Visit: Payer: Self-pay

## 2024-08-04 ENCOUNTER — Ambulatory Visit
Admission: RE | Admit: 2024-08-04 | Discharge: 2024-08-04 | Attending: Radiation Oncology | Admitting: Radiation Oncology

## 2024-08-04 DIAGNOSIS — Z510 Encounter for antineoplastic radiation therapy: Secondary | ICD-10-CM | POA: Diagnosis not present

## 2024-08-04 LAB — RAD ONC ARIA SESSION SUMMARY
Course Elapsed Days: 52
Plan Fractions Treated to Date: 10
Plan Prescribed Dose Per Fraction: 2 Gy
Plan Total Fractions Prescribed: 15
Plan Total Prescribed Dose: 30 Gy
Reference Point Dosage Given to Date: 60 Gy
Reference Point Session Dosage Given: 2 Gy
Session Number: 30

## 2024-08-05 ENCOUNTER — Emergency Department

## 2024-08-05 ENCOUNTER — Other Ambulatory Visit: Payer: Self-pay

## 2024-08-05 ENCOUNTER — Emergency Department
Admission: EM | Admit: 2024-08-05 | Discharge: 2024-08-05 | Disposition: A | Discharge status: Home / Self Care | Attending: Emergency Medicine | Admitting: Emergency Medicine

## 2024-08-05 DIAGNOSIS — I10 Essential (primary) hypertension: Secondary | ICD-10-CM | POA: Insufficient documentation

## 2024-08-05 DIAGNOSIS — J45909 Unspecified asthma, uncomplicated: Secondary | ICD-10-CM | POA: Insufficient documentation

## 2024-08-05 DIAGNOSIS — W19XXXA Unspecified fall, initial encounter: Secondary | ICD-10-CM

## 2024-08-05 DIAGNOSIS — Y92009 Unspecified place in unspecified non-institutional (private) residence as the place of occurrence of the external cause: Secondary | ICD-10-CM | POA: Insufficient documentation

## 2024-08-05 DIAGNOSIS — S40012A Contusion of left shoulder, initial encounter: Secondary | ICD-10-CM | POA: Insufficient documentation

## 2024-08-05 DIAGNOSIS — W000XXA Fall on same level due to ice and snow, initial encounter: Secondary | ICD-10-CM | POA: Insufficient documentation

## 2024-08-05 DIAGNOSIS — Z85818 Personal history of malignant neoplasm of other sites of lip, oral cavity, and pharynx: Secondary | ICD-10-CM | POA: Insufficient documentation

## 2024-08-05 LAB — BASIC METABOLIC PANEL WITH GFR
Anion gap: 12 (ref 5–15)
BUN: 14 mg/dL (ref 6–20)
CO2: 23 mmol/L (ref 22–32)
Calcium: 9.4 mg/dL (ref 8.9–10.3)
Chloride: 100 mmol/L (ref 98–111)
Creatinine, Ser: 0.6 mg/dL (ref 0.44–1.00)
GFR, Estimated: >60 mL/min (ref >60)
Glucose, Bld: 99 mg/dL (ref 70–99)
Potassium: 3.5 mmol/L (ref 3.5–5.1)
Sodium: 136 mmol/L (ref 135–145)

## 2024-08-05 LAB — CBC
HCT: 25.9 % — ABNORMAL LOW (ref 36.0–46.0)
Hemoglobin: 8.5 g/dL — ABNORMAL LOW (ref 12.0–15.0)
MCH: 30.8 pg (ref 26.0–34.0)
MCHC: 32.8 g/dL (ref 30.0–36.0)
MCV: 93.8 fL (ref 80.0–100.0)
Platelets: 208 K/uL (ref 150–400)
RBC: 2.76 MIL/uL — ABNORMAL LOW (ref 3.87–5.11)
RDW: 18.9 % — ABNORMAL HIGH (ref 11.5–15.5)
WBC: 5.3 K/uL (ref 4.0–10.5)
nRBC: 0 % (ref 0.0–0.2)

## 2024-08-05 LAB — HCG, QUANTITATIVE, PREGNANCY: hCG, Beta Chain, Quant, S: <1 m[IU]/mL (ref <5)

## 2024-08-05 LAB — MAGNESIUM: Magnesium: 1.6 mg/dL — ABNORMAL LOW (ref 1.7–2.4)

## 2024-08-05 LAB — TROPONIN T, HIGH SENSITIVITY: Troponin T High Sensitivity: <15 ng/L (ref 0–19)

## 2024-08-05 MED ORDER — ONDANSETRON HCL 4 MG/2ML IJ SOLN
4.0000 mg | Freq: Once | INTRAMUSCULAR | Status: DC
  Filled 2024-08-05: qty 2

## 2024-08-05 MED ORDER — SODIUM CHLORIDE 0.9 % IV BOLUS
1000.0000 mL | Freq: Once | INTRAVENOUS | Status: AC
  Administered 2024-08-05: 1000 mL via INTRAVENOUS

## 2024-08-05 MED ORDER — PANTOPRAZOLE SODIUM 40 MG IV SOLR
40.0000 mg | Freq: Once | INTRAVENOUS | Status: AC
  Administered 2024-08-05: 40 mg via INTRAVENOUS
  Filled 2024-08-05: qty 10

## 2024-08-05 MED ORDER — DROPERIDOL 2.5 MG/ML IJ SOLN
2.5000 mg | Freq: Once | INTRAMUSCULAR | Status: AC
  Administered 2024-08-05: 2.5 mg via INTRAVENOUS
  Filled 2024-08-05: qty 2

## 2024-08-05 NOTE — ED Provider Notes (Signed)
 [facility] Provider Note    Event Date/Time   First MD Initiated Contact with Patient 08/05/24 0154     (approximate)   History   Fall and Chest Pain   HPI  Brandi [name] is a 28 y.o. 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  Fell onto her left shoulder.  No head strike or loss of consciousness no other traumatic injuries.  Chronic unchanged abdominal discomfort and nausea and vomiting, no GI bleeding, no worsening.  No other acute medical complaints.  External Medical Documents Reviewed: Previous hospital notes      Physical Exam   Triage Vital Signs: ED Triage Vitals  Encounter Vitals Group     BP 08/05/24 0110 (!) 143/94     Girls Systolic BP Percentile --      Girls Diastolic BP Percentile --      Boys Systolic BP Percentile --      Boys Diastolic BP Percentile --      Pulse Rate 08/05/24 0110 (!) 108     Resp 08/05/24 0110 18     Temp 08/05/24 0110 98.1 F (36.7 C)     Temp Source 08/05/24 0110 Oral     SpO2 08/05/24 0110 98 %     Weight 08/05/24 0111 192 lb 7.4 oz (87.3 kg)     Height 08/05/24 0111 5' 2 (1.575 m)     Head Circumference --      Peak Flow --      Pain Score 08/05/24 0111 10     Pain Loc --      Pain Education --      Exclude from Growth Chart --     Most recent vital signs: Vitals:   08/05/24 0110 08/05/24 0300  BP: (!) 143/94 (!) 152/95  Pulse: (!) 108 96  Resp: 18 16  Temp: 98.1 F (36.7 C) 98.3 F (36.8 C)  SpO2: 98% 98%    General: Awake, no distress.  CV:  Good peripheral perfusion.  Resp:  Normal effort.  Abd:  No distention.  Other:  About the range at the shoulder but point tender on the musculature in the deltoid.  Neurovascular intact to the affected extremity.  Otherwise head to toe  examination shows no acute trauma, soft benign abdominal exam, normal vital signs albeit slightly hypertensive, skin appears warm well-perfused.   ED Results / Procedures / Treatments   Labs (all labs ordered are listed, but only abnormal results are displayed) Labs Reviewed  CBC - Abnormal; Notable for the following components:      Result Value   RBC 2.76 (*)    Hemoglobin 8.5 (*)    HCT 25.9 (*)    RDW 18.9 (*)    All other components within normal limits  MAGNESIUM  - Abnormal; Notable for the following components:   Magnesium  1.6 (*)    All other components within normal limits  BASIC METABOLIC PANEL WITH GFR  HCG, QUANTITATIVE, PREGNANCY  TROPONIN T, HIGH SENSITIVITY     I ordered and reviewed the above labs they are notable for cell counts electrolytes largely unremarkable and unchanged compared to prior, baseline anemia  EKG  ED ECG REPORT I, Ginnie Shams, the attending physician, personally viewed and interpreted this ECG.   Date: 08/05/2024  EKG Time:  0114  Rate: 105  Rhythm: sinus tachycardia  Axis: nl  Intervals:nl  ST&T Change: no stemi    RADIOLOGY I independently reviewed and interpreted shoulder x-ray and see no obvious fracture I also reviewed radiologist's formal read.   PROCEDURES:  Critical Care performed: No  Procedures   MEDICATIONS ORDERED IN ED: Medications  sodium chloride  0.9 % bolus 1,000 mL (1,000 mLs Intravenous New Bag/Given 08/05/24 0235)  droperidol  (INAPSINE ) 2.5 MG/ML injection 2.5 mg (2.5 mg Intravenous Given 08/05/24 0240)  pantoprazole  (PROTONIX ) injection 40 mg (40 mg Intravenous Given 08/05/24 0320)     IMPRESSION / MDM / ASSESSMENT AND PLAN / ED COURSE  I reviewed the triage vital signs and the nursing notes.                                Patient's presentation is most consistent with acute presentation with potential threat to life or bodily function.  Differential diagnosis includes, but is not limited to,  shoulder fracture dislocation, other traumatic injury, cyclic vomiting syndrome, dehydration electrolyte disturbance, considered but less likely intra-abdominal infection or obstruction    MDM:    Here primarily for a slip and fall and left shoulder injury with fortunately able to range, no evidence of fracture or dislocation, more likely shoulder contusion.  Neurovascular intact.  No other traumatic injuries noted.  Also, somewhat chronic vomiting abdominal discomfort unchanged from prior.  Electrolytes unremarkable.  Given fluids and droperidol  with good effect.  Requesting a medication for my GERD and given Protonix .  I considered hospitalization for admission or observation given unremarkable workup and stability Emergency Department with no significant electrolyte disturbances, plan will be for discharge home with close PMD/oncology follow-up.        FINAL CLINICAL IMPRESSION(S) / ED DIAGNOSES   Final diagnoses:  Fall, initial encounter  Contusion of left shoulder, initial encounter     Rx / DC Orders   ED Discharge Orders     None        Note:  This document was prepared using Dragon voice recognition software and may include unintentional dictation errors.    Cyrena Mylar, MD 08/05/24 0400

## 2024-08-05 NOTE — Discharge Instructions (Addendum)
Take acetaminophen 650 mg and ibuprofen 400 mg every 6 hours for pain.  Take with food.

## 2024-08-05 NOTE — ED Triage Notes (Addendum)
 Pt presents for nausea, vomiting and burning in abdomen that radiates up into the chest. Hx chemo Thursday and radiation yesterday (head/neck cancer). No changes in tube feeds. Niece at home also is sick- unable to elaborate. Also had a fall today- slipped on ice. No head strike. Landed on left shoulder.   Past Medical History:  Diagnosis Date   Asthma    Cancer (HCC)    head and neck, getting radiation   GERD (gastroesophageal reflux disease)    HTN (hypertension)

## 2024-08-07 ENCOUNTER — Ambulatory Visit

## 2024-08-07 ENCOUNTER — Ambulatory Visit
Admission: RE | Admit: 2024-08-07 | Discharge: 2024-08-07 | Attending: Radiation Oncology | Admitting: Radiation Oncology

## 2024-08-07 ENCOUNTER — Encounter

## 2024-08-07 ENCOUNTER — Other Ambulatory Visit: Payer: Self-pay

## 2024-08-07 DIAGNOSIS — Z510 Encounter for antineoplastic radiation therapy: Secondary | ICD-10-CM | POA: Diagnosis not present

## 2024-08-07 LAB — RAD ONC ARIA SESSION SUMMARY
Course Elapsed Days: 55
Plan Fractions Treated to Date: 11
Plan Prescribed Dose Per Fraction: 2 Gy
Plan Total Fractions Prescribed: 15
Plan Total Prescribed Dose: 30 Gy
Reference Point Dosage Given to Date: 62 Gy
Reference Point Session Dosage Given: 2 Gy
Session Number: 31

## 2024-08-08 ENCOUNTER — Inpatient Hospital Stay

## 2024-08-08 ENCOUNTER — Ambulatory Visit

## 2024-08-08 ENCOUNTER — Encounter: Payer: Self-pay | Admitting: Nurse Practitioner

## 2024-08-08 ENCOUNTER — Ambulatory Visit
Admission: RE | Admit: 2024-08-08 | Discharge: 2024-08-08 | Attending: Radiation Oncology | Admitting: Radiation Oncology

## 2024-08-08 ENCOUNTER — Inpatient Hospital Stay: Admitting: Nurse Practitioner

## 2024-08-08 ENCOUNTER — Other Ambulatory Visit: Payer: Self-pay

## 2024-08-08 ENCOUNTER — Telehealth: Payer: Self-pay | Admitting: *Deleted

## 2024-08-08 VITALS — BP 144/101 | HR 126 | Temp 99.5°F | Ht 62.0 in | Wt 187.9 lb

## 2024-08-08 VITALS — BP 140/93 | HR 115 | Temp 98.9°F | Resp 18

## 2024-08-08 DIAGNOSIS — R112 Nausea with vomiting, unspecified: Secondary | ICD-10-CM

## 2024-08-08 DIAGNOSIS — C119 Malignant neoplasm of nasopharynx, unspecified: Secondary | ICD-10-CM

## 2024-08-08 DIAGNOSIS — R110 Nausea: Secondary | ICD-10-CM

## 2024-08-08 DIAGNOSIS — R531 Weakness: Secondary | ICD-10-CM

## 2024-08-08 DIAGNOSIS — Z510 Encounter for antineoplastic radiation therapy: Secondary | ICD-10-CM | POA: Diagnosis not present

## 2024-08-08 LAB — RAD ONC ARIA SESSION SUMMARY
Course Elapsed Days: 56
Plan Fractions Treated to Date: 12
Plan Prescribed Dose Per Fraction: 2 Gy
Plan Total Fractions Prescribed: 15
Plan Total Prescribed Dose: 30 Gy
Reference Point Dosage Given to Date: 64 Gy
Reference Point Session Dosage Given: 2 Gy
Session Number: 32

## 2024-08-08 LAB — CBC WITH DIFFERENTIAL (CANCER CENTER ONLY)
Abs Immature Granulocytes: 0 K/uL (ref 0.00–0.07)
Basophils Absolute: 0 K/uL (ref 0.0–0.1)
Basophils Relative: 0 %
Eosinophils Absolute: 0 K/uL (ref 0.0–0.5)
Eosinophils Relative: 1 %
HCT: 27.8 % — ABNORMAL LOW (ref 36.0–46.0)
Hemoglobin: 9.4 g/dL — ABNORMAL LOW (ref 12.0–15.0)
Immature Granulocytes: 0 %
Lymphocytes Relative: 7 %
Lymphs Abs: 0.2 K/uL — ABNORMAL LOW (ref 0.7–4.0)
MCH: 31.1 pg (ref 26.0–34.0)
MCHC: 33.8 g/dL (ref 30.0–36.0)
MCV: 92.1 fL (ref 80.0–100.0)
Monocytes Absolute: 0.3 K/uL (ref 0.1–1.0)
Monocytes Relative: 10 %
Neutro Abs: 2.1 K/uL (ref 1.7–7.7)
Neutrophils Relative %: 82 %
Platelet Count: 191 K/uL (ref 150–400)
RBC: 3.02 MIL/uL — ABNORMAL LOW (ref 3.87–5.11)
RDW: 18 % — ABNORMAL HIGH (ref 11.5–15.5)
WBC Count: 2.6 K/uL — ABNORMAL LOW (ref 4.0–10.5)
nRBC: 0 % (ref 0.0–0.2)

## 2024-08-08 LAB — CMP (CANCER CENTER ONLY)
ALT: 74 U/L — ABNORMAL HIGH (ref 0–44)
AST: 26 U/L (ref 15–41)
Albumin: 4.3 g/dL (ref 3.5–5.0)
Alkaline Phosphatase: 70 U/L (ref 38–126)
Anion gap: 14 (ref 5–15)
BUN: 15 mg/dL (ref 6–20)
CO2: 29 mmol/L (ref 22–32)
Calcium: 10.2 mg/dL (ref 8.9–10.3)
Chloride: 95 mmol/L — ABNORMAL LOW (ref 98–111)
Creatinine: 0.78 mg/dL (ref 0.44–1.00)
GFR, Estimated: >60 mL/min (ref >60)
Glucose, Bld: 113 mg/dL — ABNORMAL HIGH (ref 70–99)
Potassium: 4.1 mmol/L (ref 3.5–5.1)
Sodium: 138 mmol/L (ref 135–145)
Total Bilirubin: 0.4 mg/dL (ref 0.0–1.2)
Total Protein: 7.1 g/dL (ref 6.5–8.1)

## 2024-08-08 LAB — MAGNESIUM: Magnesium: 1.5 mg/dL — ABNORMAL LOW (ref 1.7–2.4)

## 2024-08-08 MED ORDER — SODIUM CHLORIDE 0.9 % IV SOLN
Freq: Once | INTRAVENOUS | Status: AC
  Filled 2024-08-08: qty 250

## 2024-08-08 MED ORDER — FAMOTIDINE IN NACL 20-0.9 MG/50ML-% IV SOLN
20.0000 mg | Freq: Once | INTRAVENOUS | Status: AC
  Administered 2024-08-08: 20 mg via INTRAVENOUS
  Filled 2024-08-08: qty 50

## 2024-08-08 MED ORDER — PROCHLORPERAZINE EDISYLATE 10 MG/2ML IJ SOLN: 10.0000 mg | Freq: Once | INTRAMUSCULAR | Status: DC

## 2024-08-08 MED ORDER — MAGNESIUM SULFATE 2 GM/50ML IV SOLN
2.0000 g | Freq: Once | INTRAVENOUS | Status: AC
  Administered 2024-08-08: 2 g via INTRAVENOUS
  Filled 2024-08-08: qty 50

## 2024-08-08 MED ORDER — PROCHLORPERAZINE EDISYLATE 10 MG/2ML IJ SOLN
10.0000 mg | Freq: Once | INTRAMUSCULAR | Status: AC
  Administered 2024-08-08: 10 mg via INTRAVENOUS
  Filled 2024-08-08: qty 2

## 2024-08-08 NOTE — Telephone Encounter (Signed)
 Patient coming today at 1pm. Rn spoke w/pt

## 2024-08-08 NOTE — Telephone Encounter (Signed)
 Rn spoke with patient in response to her mychart msg. Pt c/o nausea/vomiting/weakness. Smc will be added today at 1pm after pt's radiation tx. Per Morna, NP - she will need cbc with diff, cmp, mg and possible iv fluids with antiemetics.

## 2024-08-08 NOTE — Progress Notes (Signed)
 Pt is complaining of feeling jittery and requesting zanex. Notified Morna Husband, NP. Explained to pt that the jitteriness is probably from the nausea medicine and the provider wants to hold off on giving Zanex for now. Jitteriness better but complaining of heart burn notified Morna Husband, NP IV pepcid  ordered and given. Pt feeling better and heart burn has improved. Vitals obtained with heart rate 115, B/P 140/93, oxygen saturation 100% on room air. Notified Morna Husband, NP of vitals signs and ok to send pt home.

## 2024-08-08 NOTE — Progress Notes (Signed)
 Nutrition Follow-up:  Patient with stage III EBV positive naopharyngeal carcinoma.  Completed cisplatin  on 12/4, last radiation planned for 12/12.  Multiple hospital admissions/ED visits for nausea, vomiting during treatment  Met with patient during IV fluids.  Reports nausea and vomiting due to thick mucus in the back of her throat.  Says that she gargled to try and get thick saliva up.  Reports that she thick mucus is making her throw up.  No bowel movement in last 2-3 days.  Spitting out mucous in emesis bag during visit today.  Said that she has been using osmolite and [employer] over the weekend.  When asked how many cartons she used she said 1/2 carton.  Later reports that she is giving full carton at a time.  Unclear how many cartons she is giving during the day.  Patient not feeling well, shaking and requesting another warm blanket during visit.      Medications: zofran , compazine , reglan , lactulose   Labs: Mag 1.5 (supplemented today)  Anthropometrics:   Weight 187 lb 14.4 oz  192 lb 9.6 oz on 12/4 195 lb on 11/26 207 lb on 11/9 204 lb 2 oz on 10/29 210 lb on 10/22 219 lb on 10/19 235 lb on 02/29/24    Estimated Energy Needs  Kcals: 2000-2300 Protein: 95-114 g Fluid: > 2000 ml  NUTRITION DIAGNOSIS: Inadequate oral intake continues   MALNUTRITION DIAGNOSIS: severe malnutrition continues   INTERVENTION:  Reviewed strategies to help with thick saliva. No difference in [facility] formula vs osmolite.  Reviewed importance of continuing to give tube feeding for adequate nutrition and fluid Encouraged antiemetics and to restart lactulose  with no bowel movement in 2-3 days.  Oral intake as able    MONITORING, EVALUATION, GOAL: weight trends, intake, tube feeding   NEXT VISIT: Thursday, Dec 18   Briannon Boggio B. Dasie SOLON, CSO, LDN Registered Dietitian [phone]

## 2024-08-09 ENCOUNTER — Ambulatory Visit
Admission: RE | Admit: 2024-08-09 | Discharge: 2024-08-09 | Attending: Radiation Oncology | Admitting: Radiation Oncology

## 2024-08-09 ENCOUNTER — Ambulatory Visit

## 2024-08-09 ENCOUNTER — Other Ambulatory Visit: Payer: Self-pay

## 2024-08-09 ENCOUNTER — Encounter: Payer: Self-pay | Admitting: Oncology

## 2024-08-09 DIAGNOSIS — Z510 Encounter for antineoplastic radiation therapy: Secondary | ICD-10-CM | POA: Diagnosis not present

## 2024-08-09 LAB — RAD ONC ARIA SESSION SUMMARY
Course Elapsed Days: 57
Plan Fractions Treated to Date: 13
Plan Prescribed Dose Per Fraction: 2 Gy
Plan Total Fractions Prescribed: 15
Plan Total Prescribed Dose: 30 Gy
Reference Point Dosage Given to Date: 66 Gy
Reference Point Session Dosage Given: 2 Gy
Session Number: 33

## 2024-08-09 NOTE — Progress Notes (Signed)
 [facility]  [phone] [phone]  ID: Brandi [name] OB: 12/11/1995  MR#: [id number]  RDW#:[id number]  Patient Care Team: [name], MD as PCP - General (Internal Medicine) [name], MD as Consulting Physician (Radiation Oncology) [name], MD as Consulting Physician (Oncology)  CHIEF COMPLAINT: Stage III EBV positive nasopharyngeal carcinoma.  INTERVAL HISTORY: Patient presents today to symptom management clinic with reports of weakness, nausea and vomiting, and dizziness.  She completed her final cycle of low-dose cisplatin  on December 4.  She reports that she has had multiple episodes of vomiting since yesterday.  Today during clinical room she had 1 episode of vomiting appeared to be clear liquid.  She reports feelings of reflux.  She is now taking 100% of nutrition through PEG tube.  She is going to be meeting with Joli registered dietitian today as well.  Unable to take Protonix  due to cost.  Patient reports that she had not been consistently trying her antiemetics.  I instructed her on the importance of using her antiemetics to help prevention of vomiting.  She verbalizes understanding.  She appears to be very anxious she does report history of anxiety.  She reports anxiety today from feeling so bad.  We will proceed with IV fluid bolus along with IV antiemetics and IV Pepcid .  Patient in agreement with plan of care.  REVIEW OF SYSTEMS:   Review of Systems  Constitutional:  Positive for malaise/fatigue. Negative for chills and fever.  HENT:  Negative for sore throat.   Respiratory: Negative.  Negative for cough, hemoptysis and shortness of breath.   Cardiovascular: Negative.  Negative for chest pain and leg swelling.  Gastrointestinal:  Positive for nausea and vomiting. Negative for abdominal pain and constipation.  Genitourinary: Negative.  Negative for dysuria.  Musculoskeletal: Negative.  Negative for back pain.   Skin: Negative.  Negative for rash.  Neurological:  Positive for weakness. Negative for dizziness, focal weakness and headaches.  Psychiatric/Behavioral:  The patient is nervous/anxious.     As per HPI. Otherwise, a complete review of systems is negative.  PAST MEDICAL HISTORY: Past Medical History:  Diagnosis Date   Asthma    Cancer (HCC)    head and neck, getting radiation   GERD (gastroesophageal reflux disease)    HTN (hypertension)     PAST SURGICAL HISTORY: Past Surgical History:  Procedure Laterality Date   dental procedure     IR GASTROSTOMY TUBE MOD SED  07/05/2024   IR US  LIVER BIOPSY  05/17/2024    FAMILY HISTORY: Family History  Problem Relation Age of Onset   Hypertension Maternal Grandmother    Diabetes Maternal Grandmother     ADVANCED DIRECTIVES (Y/N):  N  HEALTH MAINTENANCE: Social History   Tobacco Use   Smoking status: Former    Types: E-cigarettes   Smokeless tobacco: Never  Vaping Use   Vaping status: Former  Substance Use Topics   Alcohol use: Not Currently    Comment: social   Drug use: Yes    Types: Marijuana     Colonoscopy:  PAP:  Bone density:  Lipid panel:  Allergies  Allergen Reactions   Shellfish Allergy Anaphylaxis   Tomato Other (See Comments)    Flares up exema    Current Outpatient Medications  Medication Sig Dispense Refill   amLODipine  (NORVASC ) 5 MG tablet Place 1 tablet (5 mg total) into feeding tube daily. 30 tablet 0   cyclobenzaprine  (FLEXERIL ) 10 MG tablet Take 1 tablet (10 mg  total) by mouth 3 (three) times daily as needed for muscle spasms. 90 tablet 0   lactulose  (CHRONULAC ) 10 GM/15ML solution Take 45 mLs (30 g total) by mouth daily as needed for severe constipation. 473 mL 0   metoCLOPramide  (REGLAN ) 5 MG/5ML solution Take 10 mLs (10 mg total) by mouth every 6 (six) hours as needed for nausea or vomiting. 120 mL 1   Nutritional Supplements (ENSURE CLEAR) LIQD Take 1 Bottle by mouth 2 (two) times daily.  296 mL 6   Nutritional Supplements (FEEDING SUPPLEMENT, OSMOLITE 1.5 CAL,) LIQD Place 119 mLs into feeding tube 6 (six) times daily.     ondansetron  (ZOFRAN ) 4 MG tablet Place 2 tablets (8 mg total) into feeding tube every 8 (eight) hours as needed for nausea or vomiting. 30 tablet 0   pantoprazole  sodium (PROTONIX ) 40 mg Place 40 mg into feeding tube daily for 14 days. 14 each 0   phenol (CHLORASEPTIC) 1.4 % LIQD Use as directed 1 spray in the mouth or throat as needed for throat irritation / pain.     potassium chloride  20 MEQ/15ML (10%) SOLN Take 15 mLs (20 mEq total) by mouth daily. 473 mL 0   prochlorperazine  (COMPAZINE ) 10 MG tablet Take 1 tablet (10 mg total) by mouth every 8 (eight) hours as needed for nausea or vomiting. 30 tablet 0   SLYND  4 MG TABS Take 1 tablet by mouth daily.     Water  For Irrigation, Sterile (FREE WATER ) SOLN Place 60 mLs into feeding tube 6 (six) times daily.     Current Facility-Administered Medications  Medication Dose Route Frequency Provider Last Rate Last Admin   prochlorperazine  (COMPAZINE ) injection 10 mg  10 mg Intravenous Once Lanell Jacobsen, NP        OBJECTIVE: Vitals:   08/08/24 1341 08/08/24 1348  BP: (!) 158/120 (!) 144/101  Pulse: (!) 153 (!) 126  Temp: 99.5 F (37.5 C)   SpO2: 99%        Body mass index is 34.37 kg/m.    ECOG FS:1 - Symptomatic but completely ambulatory  General: Well-developed, well-nourished, no acute distress. Eyes: Pink conjunctiva, anicteric sclera. HEENT: Normocephalic, moist mucous membranes. Lungs: No audible wheezing or coughing. Heart: Regular rate and rhythm. Abdomen: Soft, nontender, no obvious distention. Musculoskeletal: No edema, cyanosis, or clubbing. Neuro: Alert, answering all questions appropriately. Cranial nerves grossly intact. Skin: No rashes or petechiae noted. Psych: Normal affect.  LAB RESULTS:  Lab Results  Component Value Date   NA 138 08/08/2024   K 4.1 08/08/2024   CL 95 (L)  08/08/2024   CO2 29 08/08/2024   GLUCOSE 113 (H) 08/08/2024   BUN 15 08/08/2024   CREATININE 0.78 08/08/2024   CALCIUM 10.2 08/08/2024   PROT 7.1 08/08/2024   ALBUMIN 4.3 08/08/2024   AST 26 08/08/2024   ALT 74 (H) 08/08/2024   ALKPHOS 70 08/08/2024   BILITOT 0.4 08/08/2024   GFRNONAA >60 08/08/2024   GFRAA >60 12/05/2019    Lab Results  Component Value Date   WBC 2.6 (L) 08/08/2024   NEUTROABS 2.1 08/08/2024   HGB 9.4 (L) 08/08/2024   HCT 27.8 (L) 08/08/2024   MCV 92.1 08/08/2024   PLT 191 08/08/2024     STUDIES: DG Shoulder Left Result Date: 08/05/2024 EXAM: 1 VIEW(S) XRAY OF THE LEFT SHOULDER 08/05/2024 01:58:00 AM COMPARISON: None available. CLINICAL HISTORY: fall FINDINGS: BONES AND JOINTS: Glenohumeral joint is normally aligned. No acute fracture. No malalignment. The [facility] joint is unremarkable. SOFT  TISSUES: No abnormal calcifications. Visualized lung is unremarkable. IMPRESSION: 1. No acute fracture or dislocation. Electronically signed by: Morgane Naveau MD 08/05/2024 02:05 AM EST RP Workstation: [id number]   DG Chest 2 View Result Date: 08/05/2024 EXAM: 2 VIEW(S) XRAY OF THE CHEST 08/05/2024 01:58:00 AM COMPARISON: 07/24/2024 CLINICAL HISTORY: cp FINDINGS: LUNGS AND PLEURA: No focal pulmonary opacity. No pleural effusion. No pneumothorax. HEART AND MEDIASTINUM: No acute abnormality of the cardiac and mediastinal silhouettes. BONES AND SOFT TISSUES: No acute osseous abnormality. IMPRESSION: 1. No acute cardiopulmonary process. Electronically signed by: Morgane Naveau MD 08/05/2024 02:05 AM EST RP Workstation: [id number]   DG Chest Port 1 View Result Date: 07/24/2024 EXAM: 1 VIEW(S) XRAY OF THE CHEST 07/24/2024 01:58:08 AM COMPARISON: 07/22/2024 CLINICAL HISTORY: SOB (shortness of breath) FINDINGS: LUNGS AND PLEURA: No focal pulmonary opacity. No pleural effusion. No pneumothorax. HEART AND MEDIASTINUM: No acute abnormality of the cardiac and mediastinal silhouettes. BONES  AND SOFT TISSUES: No acute osseous abnormality. IMPRESSION: 1. No acute cardiopulmonary process to explain shortness of breath. Electronically signed by: Oneil Devonshire MD 07/24/2024 02:15 AM EST RP Workstation: [id number]   DG ABDOMEN PEG TUBE LOCATION Result Date: 07/24/2024 EXAM: 1 VIEW XRAY OF THE ABDOMEN 07/24/2024 01:58:08 AM COMPARISON: None available. CLINICAL HISTORY: S/P percutaneous endoscopic gastrostomy (PEG) tube placement (HCC) FINDINGS: BOWEL: Nonobstructive bowel gas pattern. Contrast material opacifies the stomach and duodenal C-loop. SOFT TISSUES: Gastrostomy tube is in place overlying the mid body of the stomach. No opaque urinary calculi. BONES: No acute osseous abnormality. IMPRESSION: 1. Gastrostomy tube appropriately positioned within the stomach with contrast opacifying the stomach and duodenal C-loop. Electronically signed by: Oneil Devonshire MD 07/24/2024 02:13 AM EST RP Workstation: [id number]   DG Chest 2 View Result Date: 07/22/2024 EXAM: 2 VIEW(S) XRAY OF THE CHEST 07/22/2024 08:34:16 PM COMPARISON: 07/03/2024 CLINICAL HISTORY: productive cough, eval infiltrate FINDINGS: LUNGS AND PLEURA: No focal pulmonary opacity. No pleural effusion. No pneumothorax. HEART AND MEDIASTINUM: No acute abnormality of the cardiac and mediastinal silhouettes. BONES AND SOFT TISSUES: No acute osseous abnormality. IMPRESSION: 1. No acute cardiopulmonary process. Electronically signed by: Elsie Gravely MD 07/22/2024 08:39 PM EST RP Workstation: [id number]    ASSESSMENT: Stage III EBV positive nasopharyngeal carcinoma.  PLAN:    Nausea/vomiting: Multiple episodes of vomiting since yesterday per patient reports with 1 episode here in clinic.  Proceed with a liter of normal saline along with IV Pepcid  and IV Compazine .  Encouraged patient to utilize antiemetics at home to prevent nausea and vomiting.  [facility] dietitian to meet with patient today as well. Tachycardia: Likely secondary to vomiting and  dehydration proceed with IV fluid bolus repeat vitals post bolus   Patient expressed understanding and was in agreement with this plan. She also understands that She can call clinic at any time with any questions, concerns, or complaints.  Follow-up plan: F/u as scheduled and PRN   Cancer Staging  Nasopharyngeal carcinoma [facility]) Staging form: Pharynx - Nasopharynx, AJCC V9 - Clinical stage from 05/30/2024: Stage III (cTX, cN3, cM0) - Signed by [name], MD on 05/30/2024 Stage prefix: Initial diagnosis Method of lymph node assessment: Clinical   Morna Husband, NP   08/09/2024 1:19 PM

## 2024-08-10 ENCOUNTER — Ambulatory Visit

## 2024-08-10 ENCOUNTER — Inpatient Hospital Stay

## 2024-08-10 ENCOUNTER — Other Ambulatory Visit: Payer: Self-pay

## 2024-08-10 ENCOUNTER — Ambulatory Visit
Admission: RE | Admit: 2024-08-10 | Discharge: 2024-08-10 | Attending: Radiation Oncology | Admitting: Radiation Oncology

## 2024-08-10 ENCOUNTER — Telehealth: Payer: Self-pay | Admitting: *Deleted

## 2024-08-10 ENCOUNTER — Encounter: Payer: Self-pay | Admitting: Oncology

## 2024-08-10 DIAGNOSIS — Z510 Encounter for antineoplastic radiation therapy: Secondary | ICD-10-CM | POA: Diagnosis not present

## 2024-08-10 LAB — RAD ONC ARIA SESSION SUMMARY
Course Elapsed Days: 58
Plan Fractions Treated to Date: 14
Plan Prescribed Dose Per Fraction: 2 Gy
Plan Total Fractions Prescribed: 15
Plan Total Prescribed Dose: 30 Gy
Reference Point Dosage Given to Date: 68 Gy
Reference Point Session Dosage Given: 2 Gy
Session Number: 34

## 2024-08-10 NOTE — Telephone Encounter (Signed)
 I got a call from the case manager at [facility] with concerns about patient based on high utilization of ED visits and admissions. She said there was concern that her marijuana use is contributing to her nausea and vomiting and asked me to see if this had been directly discussed with patient. Please advise.

## 2024-08-11 ENCOUNTER — Other Ambulatory Visit: Payer: Self-pay | Admitting: *Deleted

## 2024-08-11 ENCOUNTER — Other Ambulatory Visit: Payer: Self-pay

## 2024-08-11 ENCOUNTER — Inpatient Hospital Stay

## 2024-08-11 ENCOUNTER — Ambulatory Visit

## 2024-08-11 ENCOUNTER — Ambulatory Visit
Admission: RE | Admit: 2024-08-11 | Discharge: 2024-08-11 | Attending: Radiation Oncology | Admitting: Radiation Oncology

## 2024-08-11 ENCOUNTER — Inpatient Hospital Stay: Admitting: Nurse Practitioner

## 2024-08-11 ENCOUNTER — Other Ambulatory Visit (HOSPITAL_COMMUNITY): Payer: Self-pay

## 2024-08-11 ENCOUNTER — Encounter: Payer: Self-pay | Admitting: Nurse Practitioner

## 2024-08-11 VITALS — BP 130/88 | HR 95 | Temp 99.0°F | Resp 18

## 2024-08-11 DIAGNOSIS — E876 Hypokalemia: Secondary | ICD-10-CM

## 2024-08-11 DIAGNOSIS — R112 Nausea with vomiting, unspecified: Secondary | ICD-10-CM

## 2024-08-11 DIAGNOSIS — Z510 Encounter for antineoplastic radiation therapy: Secondary | ICD-10-CM | POA: Diagnosis not present

## 2024-08-11 DIAGNOSIS — F419 Anxiety disorder, unspecified: Secondary | ICD-10-CM | POA: Diagnosis not present

## 2024-08-11 DIAGNOSIS — C119 Malignant neoplasm of nasopharynx, unspecified: Secondary | ICD-10-CM

## 2024-08-11 DIAGNOSIS — R531 Weakness: Secondary | ICD-10-CM

## 2024-08-11 LAB — CBC WITH DIFFERENTIAL (CANCER CENTER ONLY)
Abs Immature Granulocytes: 0.01 K/uL (ref 0.00–0.07)
Basophils Absolute: 0 K/uL (ref 0.0–0.1)
Basophils Relative: 0 %
Eosinophils Absolute: 0 K/uL (ref 0.0–0.5)
Eosinophils Relative: 1 %
HCT: 26.3 % — ABNORMAL LOW (ref 36.0–46.0)
Hemoglobin: 9.1 g/dL — ABNORMAL LOW (ref 12.0–15.0)
Immature Granulocytes: 0 %
Lymphocytes Relative: 5 %
Lymphs Abs: 0.1 K/uL — ABNORMAL LOW (ref 0.7–4.0)
MCH: 31.8 pg (ref 26.0–34.0)
MCHC: 34.6 g/dL (ref 30.0–36.0)
MCV: 92 fL (ref 80.0–100.0)
Monocytes Absolute: 0.1 K/uL (ref 0.1–1.0)
Monocytes Relative: 6 %
Neutro Abs: 2.2 K/uL (ref 1.7–7.7)
Neutrophils Relative %: 88 %
Platelet Count: 142 K/uL — ABNORMAL LOW (ref 150–400)
RBC: 2.86 MIL/uL — ABNORMAL LOW (ref 3.87–5.11)
RDW: 17.8 % — ABNORMAL HIGH (ref 11.5–15.5)
WBC Count: 2.5 K/uL — ABNORMAL LOW (ref 4.0–10.5)
nRBC: 0 % (ref 0.0–0.2)

## 2024-08-11 LAB — RAD ONC ARIA SESSION SUMMARY
Course Elapsed Days: 59
Plan Fractions Treated to Date: 15
Plan Prescribed Dose Per Fraction: 2 Gy
Plan Total Fractions Prescribed: 15
Plan Total Prescribed Dose: 30 Gy
Reference Point Dosage Given to Date: 70 Gy
Reference Point Session Dosage Given: 2 Gy
Session Number: 35

## 2024-08-11 LAB — CMP (CANCER CENTER ONLY)
ALT: 49 U/L — ABNORMAL HIGH (ref 0–44)
AST: 19 U/L (ref 15–41)
Albumin: 4.3 g/dL (ref 3.5–5.0)
Alkaline Phosphatase: 72 U/L (ref 38–126)
Anion gap: 15 (ref 5–15)
BUN: 14 mg/dL (ref 6–20)
CO2: 28 mmol/L (ref 22–32)
Calcium: 9.8 mg/dL (ref 8.9–10.3)
Chloride: 90 mmol/L — ABNORMAL LOW (ref 98–111)
Creatinine: 0.94 mg/dL (ref 0.44–1.00)
GFR, Estimated: >60 mL/min (ref >60)
Glucose, Bld: 124 mg/dL — ABNORMAL HIGH (ref 70–99)
Potassium: 3 mmol/L — ABNORMAL LOW (ref 3.5–5.1)
Sodium: 133 mmol/L — ABNORMAL LOW (ref 135–145)
Total Bilirubin: 0.4 mg/dL (ref 0.0–1.2)
Total Protein: 7.1 g/dL (ref 6.5–8.1)

## 2024-08-11 LAB — MAGNESIUM: Magnesium: 1.6 mg/dL — ABNORMAL LOW (ref 1.7–2.4)

## 2024-08-11 MED ORDER — MAGNESIUM SULFATE 2 GM/50ML IV SOLN
2.0000 g | Freq: Once | INTRAVENOUS | Status: AC
  Administered 2024-08-11: 2 g via INTRAVENOUS
  Filled 2024-08-11: qty 50

## 2024-08-11 MED ORDER — ALPRAZOLAM 0.25 MG PO TABS: 0.2500 mg | ORAL_TABLET | Freq: Every evening | ORAL | 0 refills | Status: AC | PRN

## 2024-08-11 MED ORDER — ONDANSETRON HCL 4 MG/2ML IJ SOLN
4.0000 mg | Freq: Once | INTRAMUSCULAR | Status: AC
  Administered 2024-08-11: 4 mg via INTRAVENOUS
  Filled 2024-08-11: qty 2

## 2024-08-11 MED ORDER — OMEPRAZOLE 20 MG PO CPDR: 20.0000 mg | DELAYED_RELEASE_CAPSULE | Freq: Every day | ORAL | 1 refills | Status: AC

## 2024-08-11 MED ORDER — SODIUM CHLORIDE 0.9 % IV SOLN
INTRAVENOUS | Status: DC
  Filled 2024-08-11 (×2): qty 250

## 2024-08-11 MED ORDER — POTASSIUM CHLORIDE 10 MEQ/100ML IV SOLN
10.0000 meq | Freq: Once | INTRAVENOUS | Status: AC
  Administered 2024-08-11: 10 meq via INTRAVENOUS
  Filled 2024-08-11: qty 100

## 2024-08-11 MED ORDER — FAMOTIDINE IN NACL 20-0.9 MG/50ML-% IV SOLN
20.0000 mg | Freq: Once | INTRAVENOUS | Status: AC
  Administered 2024-08-11: 20 mg via INTRAVENOUS
  Filled 2024-08-11: qty 50

## 2024-08-11 MED ORDER — POTASSIUM CHLORIDE 20 MEQ/15ML (10%) PO SOLN: 20.0000 meq | Freq: Every day | ORAL | 2 refills | Status: AC

## 2024-08-11 NOTE — Progress Notes (Signed)
 [facility] CSW Progress Note  CSW Intern called patient at appointed time for counseling, however unable to complete appointment due to patient's need for IV fluids after radiation treatment. Intern will call next week and see patient on 08/17/24 at next [facility] appointment.  Meleah Demeyer B. Delores Clinical Social Work Intern [employer]

## 2024-08-11 NOTE — Progress Notes (Signed)
 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  INTERVAL HISTORY: Patient presents today to symptom management clinic from radiation with reports of weakness, nausea and vomiting. She completed her final cycle of low-dose cisplatin  on December 4.  Completed final radiation treatment today.  While down in radiation patient was frequently in the restroom vomiting.  Today Patient reports feeling nauseated with frequent vomiting.  She reports that she thinks that all of the mucous from radiation makes her sick on her stomach.  She does report using her antiemetic medication earlier today before radiation visit.  I repeat cbc with diff, cmp, and magnesium  today.  Patient did receive ivf bolus earlier this week with magnesium  replacement.  Despite 2 gm magnesium  replacement earlier this week her Hg level remains 1.6.  Today her potassium is low at 3.0.  She reports that she has not been taking her potassium and doesn't know where her prescription is.  I resent the prescription and will give IV mag and Potassium today along IVF bolus with IV zofran  and pepcid .  She reports having symptoms of reflux, unable to afford protonix  that was prescribed to her.  I sent in a prescription of omeprazole  today.    Due to ongoing concerns with reflux being that she has a peg tube I have referred her to GI today.  I also feel that she is having a degree of depression and anxiety as well.  She is currently active with our child psychotherapist.  She was started on xanax  with week for anxiety.  I have also sent in a referral for psychiatry.     REVIEW OF SYSTEMS:    Review of Systems  Constitutional:  Positive for malaise/fatigue. Negative for chills and fever.  HENT:  Negative for sore throat.   Respiratory: Negative.  Negative for cough, hemoptysis and shortness of breath.   Cardiovascular: Negative.  Negative for chest pain and leg swelling.  Gastrointestinal:  Positive for nausea and vomiting. Negative for abdominal pain and constipation.  Genitourinary: Negative.  Negative for dysuria.  Musculoskeletal: Negative.  Negative for back pain.  Skin: Negative.  Negative for rash.  Neurological:  Positive for weakness. Negative for dizziness, focal weakness and headaches.  Psychiatric/Behavioral:  The patient is nervous/anxious.     As per HPI. Otherwise, a complete review of systems is negative.  PAST MEDICAL HISTORY: Past Medical History:  Diagnosis Date   Asthma    Cancer (HCC)    head and neck, getting radiation   GERD (gastroesophageal reflux disease)    HTN (hypertension)     PAST SURGICAL HISTORY: Past Surgical History:  Procedure Laterality Date   dental procedure     IR GASTROSTOMY TUBE MOD SED  07/05/2024   IR US  LIVER BIOPSY  05/17/2024    FAMILY HISTORY: Family History  Problem Relation Age of Onset   Hypertension Maternal Grandmother    Diabetes Maternal Grandmother     ADVANCED DIRECTIVES (Y/N):  N  HEALTH MAINTENANCE: Social History   Tobacco Use   Smoking status: Former    Types: E-cigarettes   Smokeless tobacco: Never  Vaping Use   Vaping status: Former  Substance Use Topics   Alcohol use: Not Currently  Comment: social   Drug use: Yes    Types: Marijuana     Colonoscopy:  PAP:  Bone density:  Lipid panel:  Allergies  Allergen Reactions   Shellfish Allergy Anaphylaxis   Tomato Other (See Comments)    Flares up exema    Current Outpatient Medications  Medication Sig Dispense Refill   amLODipine  (NORVASC ) 5 MG tablet Place 1 tablet (5 mg total) into feeding tube daily. 30 tablet 0    Nutritional Supplements (ENSURE CLEAR) LIQD Take 1 Bottle by mouth 2 (two) times daily. 296 mL 6   Nutritional Supplements (FEEDING SUPPLEMENT, OSMOLITE 1.5 CAL,) LIQD Place 119 mLs into feeding tube 6 (six) times daily.     ondansetron  (ZOFRAN ) 4 MG tablet Place 2 tablets (8 mg total) into feeding tube every 8 (eight) hours as needed for nausea or vomiting. 30 tablet 0   prochlorperazine  (COMPAZINE ) 10 MG tablet Take 1 tablet (10 mg total) by mouth every 8 (eight) hours as needed for nausea or vomiting. 30 tablet 0   SLYND  4 MG TABS Take 1 tablet by mouth daily.     Water  For Irrigation, Sterile (FREE WATER ) SOLN Place 60 mLs into feeding tube 6 (six) times daily.     ALPRAZolam  (XANAX ) 0.25 MG tablet Take 1 tablet (0.25 mg total) by mouth at bedtime as needed for anxiety. (Patient not taking: Reported on 08/11/2024) 30 tablet 0   cyclobenzaprine  (FLEXERIL ) 10 MG tablet Take 1 tablet (10 mg total) by mouth 3 (three) times daily as needed for muscle spasms. (Patient not taking: Reported on 08/11/2024) 90 tablet 0   lactulose  (CHRONULAC ) 10 GM/15ML solution Take 45 mLs (30 g total) by mouth daily as needed for severe constipation. (Patient not taking: Reported on 08/11/2024) 473 mL 0   metoCLOPramide  (REGLAN ) 5 MG/5ML solution Take 10 mLs (10 mg total) by mouth every 6 (six) hours as needed for nausea or vomiting. (Patient not taking: Reported on 08/11/2024) 120 mL 1   omeprazole  (PRILOSEC) 20 MG capsule Take 1 capsule (20 mg total) by mouth daily. Open capsule pour into water  administer through tube flush with water  after administration 30 capsule 1   phenol (CHLORASEPTIC) 1.4 % LIQD Use as directed 1 spray in the mouth or throat as needed for throat irritation / pain. (Patient not taking: Reported on 08/11/2024)     potassium chloride  20 MEQ/15ML (10%) SOLN Take 15 mLs (20 mEq total) by mouth daily. (Patient not taking: Reported on 08/11/2024) 473 mL 2   No current facility-administered medications  for this visit.    OBJECTIVE: Vitals:   08/11/24 1142  BP: 130/88  Pulse: 95  Resp: 18  Temp: 99 F (37.2 C)  SpO2: 100%       There is no height or weight on file to calculate BMI.    ECOG FS:1 - Symptomatic but completely ambulatory  General: Well-developed, well-nourished, no acute distress. Eyes: Pink conjunctiva, anicteric sclera. HEENT: Normocephalic, moist mucous membranes. Lungs: No audible wheezing or coughing. Heart: Regular rate and rhythm. Abdomen: Soft, nontender, no obvious distention. Musculoskeletal: No edema, cyanosis, or clubbing. Neuro: Alert, answering all questions appropriately. Cranial nerves grossly intact. Skin: No rashes or petechiae noted. Psych: Normal affect.  LAB RESULTS:  Lab Results  Component Value Date   NA 133 (L) 08/11/2024   K 3.0 (L) 08/11/2024   CL 90 (L) 08/11/2024   CO2 28 08/11/2024   GLUCOSE 124 (H) 08/11/2024   BUN 14 08/11/2024   CREATININE 0.94  08/11/2024   CALCIUM 9.8 08/11/2024   PROT 7.1 08/11/2024   ALBUMIN 4.3 08/11/2024   AST 19 08/11/2024   ALT 49 (H) 08/11/2024   ALKPHOS 72 08/11/2024   BILITOT 0.4 08/11/2024   GFRNONAA >60 08/11/2024   GFRAA >60 12/05/2019    Lab Results  Component Value Date   WBC 2.5 (L) 08/11/2024   NEUTROABS 2.2 08/11/2024   HGB 9.1 (L) 08/11/2024   HCT 26.3 (L) 08/11/2024   MCV 92.0 08/11/2024   PLT 142 (L) 08/11/2024   Lab Results  Component Value Date   MG 1.6 (L) 08/11/2024   MG 1.5 (L) 08/08/2024   MG 1.6 (L) 08/05/2024     STUDIES: DG Shoulder Left Result Date: 08/05/2024 EXAM: 1 VIEW(S) XRAY OF THE LEFT SHOULDER 08/05/2024 01:58:00 AM COMPARISON: None available. CLINICAL HISTORY: fall FINDINGS: BONES AND JOINTS: Glenohumeral joint is normally aligned. No acute fracture. No malalignment. The [facility] joint is unremarkable. SOFT TISSUES: No abnormal calcifications. Visualized lung is unremarkable. IMPRESSION: 1. No acute fracture or dislocation. Electronically signed by:  Morgane Naveau MD 08/05/2024 02:05 AM EST RP Workstation: [id number]   DG Chest 2 View Result Date: 08/05/2024 EXAM: 2 VIEW(S) XRAY OF THE CHEST 08/05/2024 01:58:00 AM COMPARISON: 07/24/2024 CLINICAL HISTORY: cp FINDINGS: LUNGS AND PLEURA: No focal pulmonary opacity. No pleural effusion. No pneumothorax. HEART AND MEDIASTINUM: No acute abnormality of the cardiac and mediastinal silhouettes. BONES AND SOFT TISSUES: No acute osseous abnormality. IMPRESSION: 1. No acute cardiopulmonary process. Electronically signed by: Morgane Naveau MD 08/05/2024 02:05 AM EST RP Workstation: [id number]   DG Chest Port 1 View Result Date: 07/24/2024 EXAM: 1 VIEW(S) XRAY OF THE CHEST 07/24/2024 01:58:08 AM COMPARISON: 07/22/2024 CLINICAL HISTORY: SOB (shortness of breath) FINDINGS: LUNGS AND PLEURA: No focal pulmonary opacity. No pleural effusion. No pneumothorax. HEART AND MEDIASTINUM: No acute abnormality of the cardiac and mediastinal silhouettes. BONES AND SOFT TISSUES: No acute osseous abnormality. IMPRESSION: 1. No acute cardiopulmonary process to explain shortness of breath. Electronically signed by: Oneil Devonshire MD 07/24/2024 02:15 AM EST RP Workstation: [id number]   DG ABDOMEN PEG TUBE LOCATION Result Date: 07/24/2024 EXAM: 1 VIEW XRAY OF THE ABDOMEN 07/24/2024 01:58:08 AM COMPARISON: None available. CLINICAL HISTORY: S/P percutaneous endoscopic gastrostomy (PEG) tube placement (HCC) FINDINGS: BOWEL: Nonobstructive bowel gas pattern. Contrast material opacifies the stomach and duodenal C-loop. SOFT TISSUES: Gastrostomy tube is in place overlying the mid body of the stomach. No opaque urinary calculi. BONES: No acute osseous abnormality. IMPRESSION: 1. Gastrostomy tube appropriately positioned within the stomach with contrast opacifying the stomach and duodenal C-loop. Electronically signed by: Oneil Devonshire MD 07/24/2024 02:13 AM EST RP Workstation: [id number]   DG Chest 2 View Result Date: 07/22/2024 EXAM: 2  VIEW(S) XRAY OF THE CHEST 07/22/2024 08:34:16 PM COMPARISON: 07/03/2024 CLINICAL HISTORY: productive cough, eval infiltrate FINDINGS: LUNGS AND PLEURA: No focal pulmonary opacity. No pleural effusion. No pneumothorax. HEART AND MEDIASTINUM: No acute abnormality of the cardiac and mediastinal silhouettes. BONES AND SOFT TISSUES: No acute osseous abnormality. IMPRESSION: 1. No acute cardiopulmonary process. Electronically signed by: Elsie Gravely MD 07/22/2024 08:39 PM EST RP Workstation: [id number]    ASSESSMENT: Stage III EBV positive nasopharyngeal carcinoma.  PLAN:    Nausea/vomiting/Reflux: Multiple episodes of vomiting today in radiation clinic.  Proceed with IVF bolus and zofran .  She does report that she utilized her antiemetic earlier today.  Encouraged her to utilize antiemetic routinely.  Patient reports feelings of reflux as well.  Unable to afford protonix .  Sent RX for  omeprazole  today.  Refer to GI.  Proceed with IV pepcid  today here in clinic.  Hypokalemia: Today mild hypokalemia at 3.0 she reports that she has not been taking potassium.  Resent Rx of potassium, encouraged her to pick it up and start today.  Replaced with 10 Meq IV today. Hypomagnesia: Today at 1.6 due to ongoing vomiting will go ahead and replace with 2 gm iv today. Anxiety/depression:  Patient in high stress situation with her health.  Very anxious at times, flat affect.  Continues to work with our clinical child psychotherapist.  Xanax  sent in for patient this week.  Referred to psychiatry for assistance with management of anxiety/depression.    Patient expressed understanding and was in agreement with this plan. She also understands that She can call clinic at any time with any questions, concerns, or complaints.  Follow-up plan: Proceed with ivf bolus today with iv zofran , pepcid , magnesium , potassium F/U as scheduled and PRN Refer to GI and psychiatry LP   Cancer Staging  Nasopharyngeal carcinoma [facility]) Staging  form: Pharynx - Nasopharynx, AJCC V9 - Clinical stage from 05/30/2024: Stage III (cTX, cN3, cM0) - Signed by [name], MD on 05/30/2024 Stage prefix: Initial diagnosis Method of lymph node assessment: Clinical   Morna Husband, NP   08/11/2024 10:26 PM

## 2024-08-11 NOTE — Patient Instructions (Signed)
 Nausea and Vomiting, Adult Nausea is feeling that you have an upset stomach and that you are about to vomit. Vomiting is when food in your stomach forcefully comes out of your mouth. Vomiting can make you feel weak. If you vomit, or if you are not able to drink enough fluids, you may not have enough water in your body (get dehydrated). If you do not have enough water in your body, you may: Feel tired. Feel thirsty. Have a dry mouth. Have cracked lips. Pee (urinate) less often. Older adults and people with other diseases or a weak body defense system (immune system) are at higher risk for not having enough water in the body. If you feel like you may vomit or you vomit, it is important to follow instructions from your doctor about how to take care of yourself. Follow these instructions at home: Watch your symptoms for any changes. Tell your doctor about them. Eating and drinking     Take an ORS (oral rehydration solution). This is a drink that is sold at pharmacies and stores. Drink clear fluids in small amounts as you are able, such as: Water. Ice chips. Fruit juice that has water added (diluted fruit juice). Low-calorie sports drinks. Eat bland, easy-to-digest foods in small amounts as you are able, such as: Bananas. Applesauce. Rice. Low-fat (lean) meats. Toast. Crackers. Avoid drinking fluids that have a lot of sugar or caffeine in them. This includes energy drinks, sports drinks, and soda. Avoid alcohol. Avoid spicy or fatty foods. General instructions Take over-the-counter and prescription medicines only as told by your doctor. Drink enough fluid to keep your pee (urine) pale yellow. Wash your hands often with soap and water for at least 20 seconds. If you cannot use soap and water, use hand sanitizer. Make sure that everyone in your home washes their hands well and often. Rest at home until you feel better. Watch your condition for any changes. Take slow and deep breaths  when you feel like you may vomit. Keep all follow-up visits. Contact a doctor if: Your symptoms get worse. You have new symptoms. You have a fever. You cannot drink fluids without vomiting. You feel like you may vomit for more than 2 days. You feel light-headed or dizzy. You have a headache. You have muscle cramps. You have a rash. You have pain while peeing. Get help right away if: You have pain in your chest, neck, arm, or jaw. You feel very weak or you faint. You vomit again and again. You have vomit that is bright red or looks like black coffee grounds. You have bloody or black poop (stools) or poop that looks like tar. You have a very bad headache, a stiff neck, or both. You have very bad pain, cramping, or bloating in your belly (abdomen). You have trouble breathing. You are breathing very quickly. Your heart is beating very quickly. Your skin feels cold and clammy. You feel confused. You have signs of losing too much water in your body, such as: Dark pee, very little pee, or no pee. Cracked lips. Dry mouth. Sunken eyes. Sleepiness. Weakness. These symptoms may be an emergency. Get help right away. Call 911. Do not wait to see if the symptoms will go away. Do not drive yourself to the hospital. Summary Nausea is feeling that you have an upset stomach and that you are about to vomit. Vomiting is when food in your stomach comes out of your mouth. Follow instructions from your doctor about eating and drinking.  Take over-the-counter and prescription medicines only as told by your doctor. Contact your doctor if your symptoms get worse or you have new symptoms. Keep all follow-up visits. This information is not intended to replace advice given to you by your health care provider. Make sure you discuss any questions you have with your health care provider. Document Revised: 02/21/2021 Document Reviewed: 02/21/2021 Elsevier Patient Education  2024 [employer].

## 2024-08-14 NOTE — Radiation Completion Notes (Signed)
 Patient Name: Brandi [name], Brandi [name] MRN: [id number] Date of Birth: 1996/07/16 Referring Physician: EVALENE REUSING, M.D. Date of Service: 2024-08-14 Radiation Oncologist: Marcey Penton, M.D. [facility] [place]                             RADIATION ONCOLOGY END OF TREATMENT NOTE     Diagnosis: C11.9 Malignant neoplasm of nasopharynx, unspecified Staging on 2024-05-30: Nasopharyngeal carcinoma (HCC) T=cTX, N=cN3, M=cM0 Intent: Curative     HPI: Patient is a 28 year old female who presented to the emergency room with bilateral adenopathy in her head and neck.  She was having pain and swelling and was initially treated with a for acute lymphadenitis.  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  PET scan was performed showing hypermetabolic right nasopharyngeal mass with extensive hypermetabolic cervical nodal adenopathy including right retropharyngeal and bilateral level 2 and 3.  She is currently on steroid therapy was having some dysphagia.  She has been seen by medical oncology with plan to proceed with concurrent chemoradiation.  She is seen today for radiation oncology opinion.      ==========DELIVERED PLANS==========  First Treatment Date: 2024-06-13 Last Treatment Date: 2024-08-11   Plan Name: HN Site: Neck Technique: IMRT Mode: Photon Dose Per Fraction: 2 Gy Prescribed Dose (Delivered / Prescribed): 40 Gy / 40 Gy Prescribed Fxs (Delivered / Prescribed): 20 / 20   Plan Name: HN:1 Site: Neck Technique: IMRT Mode: Photon Dose Per Fraction: 2 Gy Prescribed Dose (Delivered / Prescribed): 30 Gy / 30 Gy Prescribed Fxs (Delivered / Prescribed): 15 / 15     ==========ON TREATMENT VISIT DATES========== 2024-06-13, 2024-06-20, 2024-07-04, 2024-07-11, 2024-07-19, 2024-07-25, 2024-08-01, 2024-08-08     ==========UPCOMING VISITS========== 09/13/2024 [facility] RAD ONCOLOGY FOLLOW UP 30 Penton Marcey, MD  08/17/2024 [facility] MED ONC NUT 45 Dasie Simple, RD  08/17/2024 [facility] ONC EST PT Reusing Evalene PARAS, MD  08/17/2024 [facility] ONC LAB [facility]  08/15/2024 [facility] TELEPHONE CALL 60 Matthews, Lisa D, RN        ==========APPENDIX - ON TREATMENT VISIT NOTES==========   See weekly On Treatment Notes in Epic for details in the Media tab (listed as Progress notes on the On Treatment Visit Dates listed above).

## 2024-08-15 ENCOUNTER — Other Ambulatory Visit: Payer: Self-pay | Admitting: *Deleted

## 2024-08-15 NOTE — Patient Outreach (Addendum)
 Complex Care Management   Visit Note  08/15/2024  Name:  Brandi [name] MRN: [id number] DOB: February 28, 1996  Situation: Referral received for Complex Care Management related to HTN I obtained verbal consent from Patient.  Visit completed with Patient  on the phone  Background:   Past Medical History:  Diagnosis Date   Asthma    Cancer (HCC)    head and neck, getting radiation   GERD (gastroesophageal reflux disease)    HTN (hypertension)     Assessment:RNCM assist with this PCP office for referrals to [facility]BCI. RNCM sent message to this PCP via his EMR (eClinicalWorks) concerning the request for a blood pressure device prescription be sent to [facility] (pt's pharmacy) for home monitoring her her HTN. Care GAPS also discussed and encouraged. Patient Reported Symptoms:  Cognitive Cognitive Status: Able to follow simple commands, Alert and oriented to person, place, and time, Normal speech and language skills   Health Maintenance Behaviors: Annual physical exam Healing Pattern: Fast Health Facilitated by: Rest  Neurological Neurological Review of Symptoms: Headaches (Pt reports due to lack of eating but easily resolved once she eats.) Neurological Management Strategies: Coping strategies, Medication therapy, Routine screening Neurological Self-Management Outcome: 3 (uncertain)  HEENT HEENT Symptoms Reported: Other: (Pt complains of a sore throat/dry mouth due to her cancer history and ongoing chemotherapy. Pt has medication but states it makes her drowsy.) HEENT Management Strategies: Adequate rest, Coping strategies, Routine screening HEENT Self-Management Outcome: 4 (good) HEENT Comment: Throat-RNCM educated on other OTC remedies to assist with her sore throat/dry month such as ice chips or suger-free popsicles, avoid alcohol and caffeine beverages, use sugar-free lozengers or gum, saliva substitues or oral moisturizing sprays such as Biotin. Also encouraged honey, soups, and  avoid spicy acidic and salty foods. Encuraged humidifers throught the home as pt verified she has two running at all times.    Cardiovascular Cardiovascular Symptoms Reported: No symptoms reported Does patient have uncontrolled Hypertension?: Yes Is patient checking Blood Pressure at home?: No Patient's Recent BP reading at home: Pt states she does not take her bp with no available device. RNCM offered to contact her provider for prescription for a home blood pressures device be sent to her local pharmacy (confirmed [facility]). Cardiovascular Management Strategies: Coping strategies, Adequate rest, Routine screening Weight: 187 lb (84.8 kg) Cardiovascular Self-Management Outcome: 4 (good)  Respiratory Respiratory Symptoms Reported: No symptoms reported Respiratory Management Strategies: Routine screening, Adequate rest Respiratory Self-Management Outcome: 4 (good)  Endocrine Endocrine Symptoms Reported: No symptoms reported Is patient diabetic?: No Endocrine Self-Management Outcome: 4 (good)  Gastrointestinal Gastrointestinal Symptoms Reported: Constipation (Pt has good relief from the medication Lactulose .) Gastrointestinal Management Strategies: Medication therapy, Coping strategies Gastrointestinal Self-Management Outcome: 4 (good)    Genitourinary Genitourinary Symptoms Reported: No symptoms reported Genitourinary Management Strategies: Coping strategies Genitourinary Self-Management Outcome: 4 (good)  Integumentary Integumentary Symptoms Reported: No symptoms reported Skin Management Strategies: Routine screening Skin Self-Management Outcome: 4 (good)  Musculoskeletal Musculoskelatal Symptoms Reviewed: No symptoms reported Musculoskeletal Management Strategies: Coping strategies, Routine screening Musculoskeletal Self-Management Outcome: 4 (good) Falls in the past year?: No Number of falls in past year: 1 or less Was there an injury with Fall?: No Fall Risk Category  Calculator: 0 Patient Fall Risk Level: Low Fall Risk Patient at Risk for Falls Due to: No Fall Risks  Psychosocial Psychosocial Symptoms Reported: No symptoms reported Additional Psychological Details: 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 Behavioral Management Strategies: Adequate rest, Coping strategies, Support system, Counseling Behavioral Health Self-Management Outcome: 4 (good) Major Change/Loss/Stressor/Fears (CP): Medical condition, self Techniques to Cope with Loss/Stress/Change: Counseling, Medication, Support group Quality of Family Relationships: helpful, involved, supportive Do you feel physically threatened by others?: No    08/15/2024    PHQ2-9 Depression Screening   Little interest or pleasure in doing things Several days  Feeling down, depressed, or hopeless Not at all  PHQ-2 - Total Score 1  Trouble falling or staying asleep, or sleeping too much    Feeling tired or having little energy    Poor appetite or overeating     Feeling bad about yourself - or that you are a failure or have let yourself or your family down    Trouble concentrating on things, such as reading the newspaper or watching television    Moving or speaking so slowly that other people could have noticed.  Or the opposite - being so fidgety or restless that you have been moving around a lot more than usual    Thoughts that you would be better off dead, or hurting yourself in some way    PHQ2-9 Total Score    If you checked off any problems, how difficult have these problems made it for you to do your work, take care of things at home, or get along with other people    Depression Interventions/Treatment  (Pt decline counseling with VBCI but received counseling at the cancer center when needed.)    Today's Vitals   08/15/24 1325  Weight: 187 lb (84.8 kg)   Pain Scale: 0-10 Pain Score: 10-Worst pain ever Pain Type:  Chronic pain Pain Location: Throat Pain Orientation: Other (Comment) (orally (throat)) Pain Descriptors / Indicators: Sore Pain Onset: On-going Patients Stated Pain Goal: 0 Pain Intervention(s): Medication (See eMAR), Other (Comment) (states she uses humidifiers, oral hydration) Multiple Pain Sites: No  Medications Reviewed Today     Reviewed by Alvia Olam BIRCH, RN (Registered Nurse) on 08/15/24 at 1318  Med List Status: <None>   Medication Order Taking? Sig Documenting Provider Last Dose Status Informant  ALPRAZolam  (XANAX ) 0.25 MG tablet [id number] Yes Take 1 tablet (0.25 mg total) by mouth at bedtime as needed for anxiety. [name], MD  Active   amLODipine  (NORVASC ) 5 MG tablet [id number] Yes Place 1 tablet (5 mg total) into feeding tube daily. [name], MD  Active   cyclobenzaprine  (FLEXERIL ) 10 MG tablet [id number]  Take 1 tablet (10 mg total) by mouth 3 (three) times daily as needed for muscle spasms.  Patient not taking: Reported on 08/11/2024   [name], MD  Active Self  lactulose  (CHRONULAC ) 10 GM/15ML solution [id number] Yes Take 45 mLs (30 g total) by mouth daily as needed for severe constipation. Josette Ade, MD  Active Self  metoCLOPramide  (REGLAN ) 5 MG/5ML solution [id number] Yes Take 10 mLs (10 mg total) by mouth every 6 (six) hours as needed for nausea or vomiting. Cox, Amy N, DO  Active   Nutritional Supplements (ENSURE CLEAR) LIQD [id number] Yes Take 1 Bottle by mouth 2 (two) times daily. [name], MD  Active Self  Nutritional Supplements (FEEDING SUPPLEMENT, OSMOLITE 1.5 CAL,) LIQD [id number] Yes Place 119 mLs into feeding tube 6 (six) times daily. Josette Ade, MD  Active Self  omeprazole  (PRILOSEC) 20 MG capsule [id number] Yes Take 1 capsule (20 mg total) by mouth daily. Open capsule pour into water  administer through tube flush with water   after administration Lanell Jacobsen, NP  Active   ondansetron  (ZOFRAN ) 4 MG tablet  [id number] Yes Place 2 tablets (8 mg total) into feeding tube every 8 (eight) hours as needed for nausea or vomiting. Levander Slate, MD  Active Self  phenol [facility]) 1.4 % LIQD [id number]  Use as directed 1 spray in the mouth or throat as needed for throat irritation / pain.  Patient not taking: Reported on 08/11/2024   Awanda City, MD  Active Self  potassium chloride  20 MEQ/15ML (10%) SOLN [id number] Yes Take 15 mLs (20 mEq total) by mouth daily. Lanell Jacobsen, NP  Active   prochlorperazine  (COMPAZINE ) 10 MG tablet [id number] Yes Take 1 tablet (10 mg total) by mouth every 8 (eight) hours as needed for nausea or vomiting. Borders, Fonda SAUNDERS, NP  Active Self  SLYND  4 MG TABS [id number]  Take 1 tablet by mouth daily.  Patient not taking: Reported on 08/15/2024   [provider]  Active Self  Water  For Irrigation, Sterile (FREE WATER ) SOLN [id number]  Place 60 mLs into feeding tube 6 (six) times daily.  Patient not taking: Reported on 08/15/2024   Josette Ade, MD  Active Self            Recommendation:   PCP Follow-up Continue Current Plan of Care  Follow Up Plan:   Telephone follow up appointment date/time:  08/29/2024 @ 1:00 PM   Olam Ku, RN, BSN [place]  [facility], [facility] RN Care Manager Direct Dial: [phone]  Fax: [phone]

## 2024-08-15 NOTE — Patient Instructions (Signed)
 Visit Information  Brandi [name] was given information about Medicaid Managed Care team care coordination services as a part of their [employer] Medicaid benefit.   If you would like to schedule transportation through your [employer] [facility] plan, please call the following number at least 2 days in advance of your appointment: [phone]  If you are experiencing a behavioral health crisis, call the [employer] [place]  Behavioral Health Crisis Line at [phone]). The line is available 24 hours a day, seven days a week.    Please see education materials related to HTN/How to take your blood pressure provided by MyChart link.  Care plan and visit instructions communicated with the patient verbally today. Patient agrees to receive a copy in MyChart. Active MyChart status and patient understanding of how to access instructions and care plan via MyChart confirmed with patient.     Telephone follow up appointment with Managed Medicaid care management team member scheduled for: 08/29/2024 @ 1:00 PM   Olam Ku, RN, BSN [place]  [facility], [facility] Health RN Care Manager Direct Dial: [phone]  Fax: [phone]    Following is a copy of your plan of care:   Goals Addressed             This Visit's Progress    VBCI RN Care Plan-HTN       Problems:  Knowledge Deficits related to HTN  Goal: Over the next 90 days the Patient will take all medications exactly as prescribed and will call provider for medication related questions as evidenced by chart review and patient reports    verbalize basic understanding of HTN disease process and self health management plan as evidenced by chart review and patient reports  Interventions:   Hypertension Interventions: Last practice recorded BP readings:  BP Readings from Last 3 Encounters:  08/11/24 130/88  08/11/24 130/88  08/08/24 (!) 140/93   Most  recent eGFR/CrCl: No results found for: EGFR  No components found for: CRCL  Evaluation of current treatment plan related to hypertension self management and patient's adherence to plan as established by provider Provided education to patient re: stroke prevention, s/s of heart attack and stroke Reviewed medications with patient and discussed importance of compliance Counseled on adverse effects of illicit drug and excessive alcohol use in patients with high blood pressure  Provided assistance with obtaining home blood pressure monitor via RNCM communicated to PCP via EMR with the request to send BP device prescription to pt's local pharmacy. Also encouraged pt to obtain a OTC if her insurance does not cover the expense of the BD device for monitoring her HTN; Discussed plans with patient for ongoing care management follow up and provided patient with direct contact information for care management team Advised patient, providing education and rationale, to monitor blood pressure daily and record, calling PCP for findings outside established parameters Discussed complications of poorly controlled blood pressure such as heart disease, stroke, circulatory complications, vision complications, kidney impairment, sexual dysfunction Screening for signs and symptoms of depression related to chronic disease state  Assessed social determinant of health barriers  Patient Self-Care Activities:  Attend all scheduled provider appointments Attend church or other social activities Call pharmacy for medication refills 3-7 days in advance of running out of medications Call provider office for new concerns or questions  Perform all self care activities independently  Perform IADL's (shopping, preparing meals, housekeeping, managing finances) independently Take medications as prescribed   write blood pressure results in a  log or diary learn about high blood pressure call doctor for signs and symptoms of high  blood pressure keep all doctor appointments take medications for blood pressure exactly as prescribed report new symptoms to your doctor limit salt intake with daily meals Currently pending obtaining a blood pressure device via PCP prescription to be sent to pt's pharmacy. Once bp device, pt will start taking her bp 3 days to weekly based upon her office visits with her providers and ongoing treatment at the cancer center.  Plan:  Telephone follow up appointment with care management team member scheduled for:  08/29/2024 @ 1:00 PM RNCM will also send the initial assessment to pt's PCP for enrollment into the VBCI care management program and services.

## 2024-08-16 ENCOUNTER — Inpatient Hospital Stay
Admission: EM | Admit: 2024-08-16 | Discharge: 2024-08-19 | DRG: 391 | Disposition: A | Discharge status: Home / Self Care | Attending: Internal Medicine | Admitting: Internal Medicine

## 2024-08-16 ENCOUNTER — Other Ambulatory Visit: Payer: Self-pay

## 2024-08-16 ENCOUNTER — Inpatient Hospital Stay
Admit: 2024-08-16 | Discharge: 2024-08-16 | Disposition: A | Discharge status: Home / Self Care | Attending: Student | Admitting: Student

## 2024-08-16 ENCOUNTER — Other Ambulatory Visit: Payer: Self-pay | Admitting: *Deleted

## 2024-08-16 ENCOUNTER — Encounter: Payer: Self-pay | Admitting: Emergency Medicine

## 2024-08-16 DIAGNOSIS — E871 Hypo-osmolality and hyponatremia: Secondary | ICD-10-CM | POA: Diagnosis present

## 2024-08-16 DIAGNOSIS — I4891 Unspecified atrial fibrillation: Secondary | ICD-10-CM | POA: Diagnosis present

## 2024-08-16 DIAGNOSIS — Z5948 Other specified lack of adequate food: Secondary | ICD-10-CM

## 2024-08-16 DIAGNOSIS — F1290 Cannabis use, unspecified, uncomplicated: Secondary | ICD-10-CM | POA: Diagnosis present

## 2024-08-16 DIAGNOSIS — D649 Anemia, unspecified: Secondary | ICD-10-CM | POA: Diagnosis present

## 2024-08-16 DIAGNOSIS — Z833 Family history of diabetes mellitus: Secondary | ICD-10-CM | POA: Diagnosis not present

## 2024-08-16 DIAGNOSIS — Z923 Personal history of irradiation: Secondary | ICD-10-CM

## 2024-08-16 DIAGNOSIS — E66811 Obesity, class 1: Secondary | ICD-10-CM | POA: Diagnosis present

## 2024-08-16 DIAGNOSIS — Z888 Allergy status to other drugs, medicaments and biological substances status: Secondary | ICD-10-CM | POA: Diagnosis not present

## 2024-08-16 DIAGNOSIS — K219 Gastro-esophageal reflux disease without esophagitis: Secondary | ICD-10-CM | POA: Diagnosis present

## 2024-08-16 DIAGNOSIS — J45909 Unspecified asthma, uncomplicated: Secondary | ICD-10-CM | POA: Diagnosis present

## 2024-08-16 DIAGNOSIS — Z91013 Allergy to seafood: Secondary | ICD-10-CM

## 2024-08-16 DIAGNOSIS — K9423 Gastrostomy malfunction: Principal | ICD-10-CM | POA: Diagnosis present

## 2024-08-16 DIAGNOSIS — Z91018 Allergy to other foods: Secondary | ICD-10-CM

## 2024-08-16 DIAGNOSIS — E861 Hypovolemia: Secondary | ICD-10-CM | POA: Diagnosis present

## 2024-08-16 DIAGNOSIS — R7881 Bacteremia: Secondary | ICD-10-CM | POA: Diagnosis not present

## 2024-08-16 DIAGNOSIS — Z5941 Food insecurity: Secondary | ICD-10-CM

## 2024-08-16 DIAGNOSIS — C119 Malignant neoplasm of nasopharynx, unspecified: Secondary | ICD-10-CM

## 2024-08-16 DIAGNOSIS — R000 Tachycardia, unspecified: Secondary | ICD-10-CM | POA: Diagnosis present

## 2024-08-16 DIAGNOSIS — Z6835 Body mass index (BMI) 35.0-35.9, adult: Secondary | ICD-10-CM | POA: Diagnosis not present

## 2024-08-16 DIAGNOSIS — E876 Hypokalemia: Secondary | ICD-10-CM | POA: Diagnosis present

## 2024-08-16 DIAGNOSIS — R112 Nausea with vomiting, unspecified: Principal | ICD-10-CM | POA: Diagnosis present

## 2024-08-16 DIAGNOSIS — Z5982 Transportation insecurity: Secondary | ICD-10-CM

## 2024-08-16 DIAGNOSIS — D61810 Antineoplastic chemotherapy induced pancytopenia: Secondary | ICD-10-CM | POA: Diagnosis present

## 2024-08-16 DIAGNOSIS — G8929 Other chronic pain: Secondary | ICD-10-CM | POA: Diagnosis present

## 2024-08-16 DIAGNOSIS — D72819 Decreased white blood cell count, unspecified: Secondary | ICD-10-CM | POA: Diagnosis not present

## 2024-08-16 DIAGNOSIS — E860 Dehydration: Secondary | ICD-10-CM | POA: Diagnosis present

## 2024-08-16 DIAGNOSIS — Z8249 Family history of ischemic heart disease and other diseases of the circulatory system: Secondary | ICD-10-CM

## 2024-08-16 DIAGNOSIS — F419 Anxiety disorder, unspecified: Secondary | ICD-10-CM | POA: Diagnosis present

## 2024-08-16 DIAGNOSIS — E869 Volume depletion, unspecified: Secondary | ICD-10-CM | POA: Diagnosis present

## 2024-08-16 DIAGNOSIS — D61818 Other pancytopenia: Secondary | ICD-10-CM | POA: Diagnosis not present

## 2024-08-16 DIAGNOSIS — I10 Essential (primary) hypertension: Secondary | ICD-10-CM | POA: Diagnosis present

## 2024-08-16 DIAGNOSIS — T451X5A Adverse effect of antineoplastic and immunosuppressive drugs, initial encounter: Secondary | ICD-10-CM | POA: Diagnosis present

## 2024-08-16 DIAGNOSIS — K529 Noninfective gastroenteritis and colitis, unspecified: Secondary | ICD-10-CM | POA: Diagnosis present

## 2024-08-16 DIAGNOSIS — Z79899 Other long term (current) drug therapy: Secondary | ICD-10-CM

## 2024-08-16 DIAGNOSIS — E878 Other disorders of electrolyte and fluid balance, not elsewhere classified: Secondary | ICD-10-CM | POA: Diagnosis present

## 2024-08-16 DIAGNOSIS — Z87891 Personal history of nicotine dependence: Secondary | ICD-10-CM

## 2024-08-16 LAB — CBC WITH DIFFERENTIAL/PLATELET
Abs Immature Granulocytes: 0 K/uL (ref 0.00–0.07)
Basophils Absolute: 0 K/uL (ref 0.0–0.1)
Basophils Relative: 0 %
Eosinophils Absolute: 0 K/uL (ref 0.0–0.5)
Eosinophils Relative: 1 %
HCT: 25.8 % — ABNORMAL LOW (ref 36.0–46.0)
Hemoglobin: 9.2 g/dL — ABNORMAL LOW (ref 12.0–15.0)
Immature Granulocytes: 0 %
Lymphocytes Relative: 10 %
Lymphs Abs: 0.2 K/uL — ABNORMAL LOW (ref 0.7–4.0)
MCH: 32.9 pg (ref 26.0–34.0)
MCHC: 35.7 g/dL (ref 30.0–36.0)
MCV: 92.1 fL (ref 80.0–100.0)
Monocytes Absolute: 0.2 K/uL (ref 0.1–1.0)
Monocytes Relative: 10 %
Neutro Abs: 1.4 K/uL — ABNORMAL LOW (ref 1.7–7.7)
Neutrophils Relative %: 79 %
Platelets: 149 K/uL — ABNORMAL LOW (ref 150–400)
RBC: 2.8 MIL/uL — ABNORMAL LOW (ref 3.87–5.11)
RDW: 19.2 % — ABNORMAL HIGH (ref 11.5–15.5)
WBC: 1.8 K/uL — ABNORMAL LOW (ref 4.0–10.5)
nRBC: 0 % (ref 0.0–0.2)

## 2024-08-16 LAB — CBC
HCT: 23.8 % — ABNORMAL LOW (ref 36.0–46.0)
Hemoglobin: 8.4 g/dL — ABNORMAL LOW (ref 12.0–15.0)
MCH: 32.4 pg (ref 26.0–34.0)
MCHC: 35.3 g/dL (ref 30.0–36.0)
MCV: 91.9 fL (ref 80.0–100.0)
Platelets: 139 K/uL — ABNORMAL LOW (ref 150–400)
RBC: 2.59 MIL/uL — ABNORMAL LOW (ref 3.87–5.11)
RDW: 18.9 % — ABNORMAL HIGH (ref 11.5–15.5)
WBC: 1.7 K/uL — ABNORMAL LOW (ref 4.0–10.5)
nRBC: 0 % (ref 0.0–0.2)

## 2024-08-16 LAB — BASIC METABOLIC PANEL WITH GFR
Anion gap: 13 (ref 5–15)
Anion gap: 15 (ref 5–15)
BUN: 6 mg/dL (ref 6–20)
BUN: 6 mg/dL (ref 6–20)
CO2: 26 mmol/L (ref 22–32)
CO2: 21 mmol/L — ABNORMAL LOW (ref 22–32)
Calcium: 9 mg/dL (ref 8.9–10.3)
Calcium: 8.4 mg/dL — ABNORMAL LOW (ref 8.9–10.3)
Chloride: 96 mmol/L — ABNORMAL LOW (ref 98–111)
Chloride: 100 mmol/L (ref 98–111)
Creatinine, Ser: 0.7 mg/dL (ref 0.44–1.00)
Creatinine, Ser: 0.77 mg/dL (ref 0.44–1.00)
GFR, Estimated: >60 mL/min (ref >60)
GFR, Estimated: >60 mL/min (ref >60)
Glucose, Bld: 115 mg/dL — ABNORMAL HIGH (ref 70–99)
Glucose, Bld: 126 mg/dL — ABNORMAL HIGH (ref 70–99)
Potassium: 2.7 mmol/L — CL (ref 3.5–5.1)
Potassium: 3 mmol/L — ABNORMAL LOW (ref 3.5–5.1)
Sodium: 135 mmol/L (ref 135–145)
Sodium: 136 mmol/L (ref 135–145)

## 2024-08-16 LAB — COMPREHENSIVE METABOLIC PANEL WITH GFR
ALT: 31 U/L (ref 0–44)
AST: 27 U/L (ref 15–41)
Albumin: 4 g/dL (ref 3.5–5.0)
Alkaline Phosphatase: 71 U/L (ref 38–126)
Anion gap: 19 — ABNORMAL HIGH (ref 5–15)
BUN: 8 mg/dL (ref 6–20)
CO2: 24 mmol/L (ref 22–32)
Calcium: 9.7 mg/dL (ref 8.9–10.3)
Chloride: 89 mmol/L — ABNORMAL LOW (ref 98–111)
Creatinine, Ser: 0.83 mg/dL (ref 0.44–1.00)
GFR, Estimated: >60 mL/min (ref >60)
Glucose, Bld: 126 mg/dL — ABNORMAL HIGH (ref 70–99)
Potassium: 3 mmol/L — ABNORMAL LOW (ref 3.5–5.1)
Sodium: 131 mmol/L — ABNORMAL LOW (ref 135–145)
Total Bilirubin: 0.5 mg/dL (ref 0.0–1.2)
Total Protein: 6.9 g/dL (ref 6.5–8.1)

## 2024-08-16 LAB — MAGNESIUM
Magnesium: 1.2 mg/dL — ABNORMAL LOW (ref 1.7–2.4)
Magnesium: 1.6 mg/dL — ABNORMAL LOW (ref 1.7–2.4)

## 2024-08-16 LAB — LIPASE, BLOOD: Lipase: 12 U/L (ref 11–51)

## 2024-08-16 LAB — HCG, QUANTITATIVE, PREGNANCY: hCG, Beta Chain, Quant, S: <1 m[IU]/mL (ref <5)

## 2024-08-16 MED ORDER — POTASSIUM CHLORIDE 10 MEQ/100ML IV SOLN
10.0000 meq | INTRAVENOUS | Status: AC
  Administered 2024-08-16 (×2): 10 meq via INTRAVENOUS
  Filled 2024-08-16 (×2): qty 100

## 2024-08-16 MED ORDER — POTASSIUM CHLORIDE IN NACL 20-0.9 MEQ/L-% IV SOLN
INTRAVENOUS | Status: AC
  Filled 2024-08-16 (×3): qty 1000

## 2024-08-16 MED ORDER — LORAZEPAM 2 MG/ML IJ SOLN
1.0000 mg | Freq: Once | INTRAMUSCULAR | Status: AC
  Administered 2024-08-16: 02:00:00 1 mg via INTRAVENOUS
  Filled 2024-08-16: qty 1

## 2024-08-16 MED ORDER — OXYCODONE HCL 5 MG PO TABS: 5.0000 mg | ORAL_TABLET | ORAL | Status: DC | PRN

## 2024-08-16 MED ORDER — POTASSIUM CHLORIDE 20 MEQ PO PACK: 40.0000 meq | PACK | ORAL | Status: DC

## 2024-08-16 MED ORDER — PROCHLORPERAZINE MALEATE 10 MG PO TABS
10.0000 mg | ORAL_TABLET | Freq: Three times a day (TID) | ORAL | Status: DC | PRN
  Filled 2024-08-16: qty 1

## 2024-08-16 MED ORDER — POTASSIUM CHLORIDE 20 MEQ PO PACK
40.0000 meq | PACK | Freq: Once | ORAL | Status: AC
  Administered 2024-08-16: 05:00:00 40 meq via ORAL
  Filled 2024-08-16: qty 2

## 2024-08-16 MED ORDER — POTASSIUM CHLORIDE 20 MEQ PO PACK
40.0000 meq | PACK | ORAL | Status: AC
  Administered 2024-08-16 (×2): 40 meq
  Filled 2024-08-16 (×2): qty 2

## 2024-08-16 MED ORDER — ONDANSETRON HCL 4 MG/2ML IJ SOLN
4.0000 mg | Freq: Four times a day (QID) | INTRAMUSCULAR | Status: DC | PRN
  Administered 2024-08-16 – 2024-08-19 (×4): 4 mg via INTRAVENOUS
  Filled 2024-08-16 (×4): qty 2

## 2024-08-16 MED ORDER — MORPHINE SULFATE (PF) 2 MG/ML IV SOLN
2.0000 mg | Freq: Four times a day (QID) | INTRAVENOUS | Status: DC | PRN
  Administered 2024-08-16 – 2024-08-19 (×9): 2 mg via INTRAVENOUS
  Filled 2024-08-16 (×9): qty 1

## 2024-08-16 MED ORDER — MAGNESIUM HYDROXIDE 400 MG/5ML PO SUSP: 30.0000 mL | Freq: Every day | ORAL | Status: DC | PRN

## 2024-08-16 MED ORDER — ONDANSETRON HCL 4 MG/5ML PO SOLN
4.0000 mg | Freq: Once | ORAL | Status: DC
  Filled 2024-08-16: qty 5

## 2024-08-16 MED ORDER — SODIUM CHLORIDE 0.9 % IV BOLUS (SEPSIS)
1000.0000 mL | Freq: Once | INTRAVENOUS | Status: AC
  Administered 2024-08-16: 01:00:00 1000 mL via INTRAVENOUS

## 2024-08-16 MED ORDER — MAGNESIUM SULFATE 2 GM/50ML IV SOLN
2.0000 g | Freq: Once | INTRAVENOUS | Status: AC
  Administered 2024-08-16: 05:00:00 2 g via INTRAVENOUS
  Filled 2024-08-16: qty 50

## 2024-08-16 MED ORDER — ACETAMINOPHEN 650 MG RE SUPP: 650.0000 mg | Freq: Four times a day (QID) | RECTAL | Status: DC | PRN

## 2024-08-16 MED ORDER — POLYETHYLENE GLYCOL 3350 17 G PO PACK
17.0000 g | PACK | Freq: Every day | ORAL | Status: DC
  Administered 2024-08-16 – 2024-08-19 (×4): 17 g
  Filled 2024-08-16 (×3): qty 1

## 2024-08-16 MED ORDER — PANTOPRAZOLE SODIUM 40 MG PO TBEC: 40.0000 mg | DELAYED_RELEASE_TABLET | Freq: Every day | ORAL | Status: DC

## 2024-08-16 MED ORDER — AMLODIPINE BESYLATE 5 MG PO TABS
5.0000 mg | ORAL_TABLET | Freq: Every day | ORAL | Status: DC
  Administered 2024-08-16 – 2024-08-19 (×4): 5 mg
  Filled 2024-08-16 (×4): qty 1

## 2024-08-16 MED ORDER — DROPERIDOL 2.5 MG/ML IJ SOLN
2.5000 mg | Freq: Once | INTRAMUSCULAR | Status: AC
  Administered 2024-08-16: 01:00:00 2.5 mg via INTRAVENOUS
  Filled 2024-08-16: qty 2

## 2024-08-16 MED ORDER — ACETAMINOPHEN 325 MG PO TABS: 650.0000 mg | ORAL_TABLET | Freq: Four times a day (QID) | ORAL | Status: DC | PRN

## 2024-08-16 MED ORDER — LORAZEPAM 0.5 MG PO TABS: 0.5000 mg | ORAL_TABLET | Freq: Three times a day (TID) | ORAL | Status: DC | PRN

## 2024-08-16 MED ORDER — POTASSIUM CHLORIDE 20 MEQ PO PACK: 20.0000 meq | PACK | Freq: Every day | ORAL | Status: DC

## 2024-08-16 MED ORDER — POTASSIUM CHLORIDE 10 MEQ/100ML IV SOLN
10.0000 meq | INTRAVENOUS | Status: AC
  Administered 2024-08-16: 03:00:00 10 meq via INTRAVENOUS
  Filled 2024-08-16: qty 100

## 2024-08-16 MED ORDER — ENSURE CLEAR PO LIQD
1.0000 | Freq: Two times a day (BID) | ORAL | Status: DC
  Administered 2024-08-16 – 2024-08-19 (×6): 1 via ORAL
  Filled 2024-08-16: qty 237

## 2024-08-16 MED ORDER — ONDANSETRON HCL 4 MG PO TABS: 4.0000 mg | ORAL_TABLET | Freq: Four times a day (QID) | ORAL | Status: DC | PRN

## 2024-08-16 MED ORDER — ENOXAPARIN SODIUM 40 MG/0.4ML IJ SOSY
0.5000 mg/kg | PREFILLED_SYRINGE | INTRAMUSCULAR | Status: DC
  Administered 2024-08-16 – 2024-08-19 (×4): 42.5 mg via SUBCUTANEOUS
  Filled 2024-08-16 (×4): qty 0.8

## 2024-08-16 MED ORDER — PHENOL 1.4 % MT LIQD
1.0000 | OROMUCOSAL | Status: DC | PRN
  Filled 2024-08-16: qty 177

## 2024-08-16 MED ORDER — TRAZODONE HCL 50 MG PO TABS
25.0000 mg | ORAL_TABLET | Freq: Every evening | ORAL | Status: DC | PRN
  Administered 2024-08-16: 18:00:00 25 mg via ORAL
  Filled 2024-08-16: qty 1

## 2024-08-16 MED ORDER — PANTOPRAZOLE SODIUM 40 MG IV SOLR
40.0000 mg | Freq: Two times a day (BID) | INTRAVENOUS | Status: DC
  Administered 2024-08-16 – 2024-08-17 (×3): 40 mg via INTRAVENOUS
  Filled 2024-08-16 (×3): qty 10

## 2024-08-16 MED ORDER — MAGNESIUM SULFATE 2 GM/50ML IV SOLN
2.0000 g | Freq: Once | INTRAVENOUS | Status: AC
  Administered 2024-08-16: 03:00:00 2 g via INTRAVENOUS
  Filled 2024-08-16: qty 50

## 2024-08-16 MED ORDER — OXYCODONE HCL 20 MG/ML PO CONC
5.0000 mg | Freq: Once | ORAL | Status: DC
  Filled 2024-08-16: qty 0.3

## 2024-08-16 MED ORDER — DROSPIRENONE 4 MG PO TABS: 1.0000 | ORAL_TABLET | Freq: Every day | ORAL | Status: DC

## 2024-08-16 NOTE — Assessment & Plan Note (Signed)
-   This is likely hypovolemic due to intractable nausea and vomiting. - She will be hydrated with IV normal saline with added potassium chloride . - Will follow BMP

## 2024-08-16 NOTE — Progress Notes (Signed)
 Anticoagulation monitoring(Lovenox ):  28 yo female ordered Lovenox  40 mg Q24h    Filed Weights   08/16/24 0022  Weight: 84 kg (185 lb 3 oz)   BMI 33.9   Lab Results  Component Value Date   CREATININE 0.83 08/16/2024   CREATININE 0.94 08/11/2024   CREATININE 0.78 08/08/2024   Estimated Creatinine Clearance: 100.6 mL/min (by C-G formula based on SCr of 0.83 mg/dL). Hemoglobin & Hematocrit     Component Value Date/Time   HGB 9.2 (L) 08/16/2024 0110   HGB 9.1 (L) 08/11/2024 1050   HCT 25.8 (L) 08/16/2024 0110     Per Protocol for Patient with estCrcl < 30 ml/min and BMI < 30, will transition to Lovenox  42.5 mg Q24h.

## 2024-08-16 NOTE — Assessment & Plan Note (Addendum)
-   The patient will be admitted to an observation telemetry bed. - This is like secondary to recent chemotherapy for hypokalemia - Will continue hydration with IV normal saline with added potassium chloride . - Clear liquids will be provided to be advanced as tolerated. - Given suspected dark vomitus will place her on IV Protonix  and obtain Gastroccult. - As needed antiemetics will be provided.

## 2024-08-16 NOTE — Plan of Care (Signed)
 Pt had chest pressure and numbness/tingling in hands and feet. Dr notified and Cardio referral put in. Problem: Education: Goal: Knowledge of General Education information will improve Description: Including pain rating scale, medication(s)/side effects and non-pharmacologic comfort measures Outcome: Not Progressing   Problem: Health Behavior/Discharge Planning: Goal: Ability to manage health-related needs will improve Outcome: Not Progressing   Problem: Clinical Measurements: Goal: Ability to maintain clinical measurements within normal limits will improve Outcome: Not Progressing Goal: Will remain free from infection Outcome: Not Progressing Goal: Diagnostic test results will improve Outcome: Not Progressing Goal: Respiratory complications will improve Outcome: Not Progressing Goal: Cardiovascular complication will be avoided Outcome: Not Progressing   Problem: Activity: Goal: Risk for activity intolerance will decrease Outcome: Not Progressing   Problem: Nutrition: Goal: Adequate nutrition will be maintained Outcome: Not Progressing   Problem: Coping: Goal: Level of anxiety will decrease Outcome: Not Progressing   Problem: Elimination: Goal: Will not experience complications related to bowel motility Outcome: Not Progressing Goal: Will not experience complications related to urinary retention Outcome: Not Progressing   Problem: Pain Managment: Goal: General experience of comfort will improve and/or be controlled Outcome: Not Progressing   Problem: Safety: Goal: Ability to remain free from injury will improve Outcome: Not Progressing   Problem: Skin Integrity: Goal: Risk for impaired skin integrity will decrease Outcome: Not Progressing

## 2024-08-16 NOTE — Consult Note (Signed)
 [facility]  [phone] [phone]  ID: Brandi [name] OB: [birth year]  MR#: [id number]  RDW#:[id number]  Patient Care Team: [name], MD as PCP - General (Internal Medicine) [name], MD as Consulting Physician (Radiation Oncology) [name], MD as Consulting Physician (Oncology) Alvia Olam BIRCH, RN as VBCI Care Management  CHIEF COMPLAINT: Hyperemesis, electrolyte disturbance, pancytopenia.  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  She is admitted to the hospital with hyperemesis and found to have electrolyte abnormalities as well as pancytopenia.  She has no neurologic complaints.  She denies any fever.  She has no chest pain, shortness of breath, cough, or hemoptysis.  She denies any constipation or diarrhea.  She has no urinary complaints.  Patient feels generally terrible, but offers no further specific complaints today.  REVIEW OF SYSTEMS:   Review of Systems  Constitutional:  Positive for malaise/fatigue. Negative for fever and weight loss.  Respiratory: Negative.  Negative for cough, hemoptysis and shortness of breath.   Cardiovascular: Negative.  Negative for chest pain and leg swelling.  Gastrointestinal:  Positive for nausea and vomiting.  Genitourinary: Negative.  Negative for dysuria.  Musculoskeletal: Negative.  Negative for back pain.  Skin: Negative.  Negative for rash.  Neurological:  Positive for weakness. Negative for dizziness and headaches.  Psychiatric/Behavioral:  The patient is nervous/anxious.     As per HPI. Otherwise, a complete review of systems is negative.  PAST MEDICAL HISTORY: Past Medical History:  Diagnosis Date   Asthma    Cancer (HCC)    head and neck, getting radiation   GERD (gastroesophageal reflux disease)    HTN (hypertension)     PAST SURGICAL  HISTORY: Past Surgical History:  Procedure Laterality Date   dental procedure     IR GASTROSTOMY TUBE MOD SED  07/05/2024   IR US  LIVER BIOPSY  05/17/2024    FAMILY HISTORY: Family History  Problem Relation Age of Onset   Hypertension Maternal Grandmother    Diabetes Maternal Grandmother     ADVANCED DIRECTIVES (Y/N):  @ADVDIR @  HEALTH MAINTENANCE: Social History[1]   Colonoscopy:  PAP:  Bone density:  Lipid panel:  Allergies[2]  Current Facility-Administered Medications  Medication Dose Route Frequency Provider Last Rate Last Admin   0.9 % NaCl with KCl 20 mEq/ L  infusion   Intravenous Continuous Mansy, Jan A, MD 100 mL/hr at 08/16/24 0752 New Bag at 08/16/24 0752   acetaminophen  (TYLENOL ) tablet 650 mg  650 mg Oral Q6H PRN Mansy, Jan A, MD       Or   acetaminophen  (TYLENOL ) suppository 650 mg  650 mg Rectal Q6H PRN Mansy, Jan A, MD       amLODipine  (NORVASC ) tablet 5 mg  5 mg Per Tube Daily Mansy, Jan A, MD   5 mg at 08/16/24 1015   enoxaparin  (LOVENOX ) injection 42.5 mg  0.5 mg/kg Subcutaneous Q24H Mansy, Jan A, MD   42.5 mg at 08/16/24 1014   Ensure Clear LIQD 1 Bottle  1 Bottle Oral BID Mansy, Jan A, MD       LORazepam  (ATIVAN ) tablet 0.5 mg  0.5 mg Oral Q8H PRN Mansy, Jan A, MD       magnesium  hydroxide (MILK OF MAGNESIA) suspension 30 mL  30 mL Oral Daily PRN Mansy, Jan A, MD       morphine  (PF) 2 MG/ML injection 2 mg  2 mg Intravenous Q6H PRN Agbata, Tochukwu, MD       ondansetron  (ZOFRAN ) tablet 4 mg  4 mg Oral Q6H PRN Mansy, Jan A, MD       Or   ondansetron  (ZOFRAN ) injection 4 mg  4 mg Intravenous Q6H PRN Mansy, Jan A, MD   4 mg at 08/16/24 0504   oxyCODONE  (Oxy IR/ROXICODONE ) immediate release tablet 5 mg  5 mg Oral Q4H PRN Mansy, Jan A, MD       pantoprazole  (PROTONIX ) injection 40 mg  40 mg Intravenous Q12H Mansy, Jan A, MD   40 mg at 08/16/24 1016   phenol (CHLORASEPTIC) mouth spray 1 spray  1 spray Mouth/Throat PRN Ward, Kristen N, DO       polyethylene  glycol (MIRALAX  / GLYCOLAX ) packet 17 g  17 g Per Tube Daily Agbata, Tochukwu, MD   17 g at 08/16/24 1210   prochlorperazine  (COMPAZINE ) tablet 10 mg  10 mg Oral Q8H PRN Mansy, Jan A, MD       traZODone  (DESYREL ) tablet 25 mg  25 mg Oral QHS PRN Mansy, Jan A, MD        OBJECTIVE: Vitals:   08/16/24 0911 08/16/24 1311  BP: 136/85 (!) 122/102  Pulse: (!) 123 (!) 117  Resp: 19 19  Temp: 98.2 F (36.8 C) 98.9 F (37.2 C)  SpO2: 100% 100%     Body mass index is 35.69 kg/m.    ECOG FS:2 - Symptomatic, <50% confined to bed  General: Ill-appearing, no acute distress. Eyes: Pink conjunctiva, anicteric sclera. HEENT: Normocephalic, moist mucous membranes. Lungs: No audible wheezing or coughing. Heart: Regular rate and rhythm. Abdomen: Soft, nontender, no obvious distention.  PEG tube in place. Musculoskeletal: No edema, cyanosis, or clubbing. Neuro: Alert, answering all questions appropriately. Cranial nerves grossly intact. Skin: No rashes or petechiae noted. Psych: Flat affect.   LAB RESULTS:  Lab Results  Component Value Date   NA 135 08/16/2024   K 2.7 (LL) 08/16/2024   CL 96 (L) 08/16/2024   CO2 26 08/16/2024   GLUCOSE 115 (H) 08/16/2024   BUN 6 08/16/2024   CREATININE 0.70 08/16/2024   CALCIUM 9.0 08/16/2024   PROT 6.9 08/16/2024   ALBUMIN 4.0 08/16/2024   AST 27 08/16/2024   ALT 31 08/16/2024   ALKPHOS 71 08/16/2024   BILITOT 0.5 08/16/2024   GFRNONAA >60 08/16/2024   GFRAA >60 12/05/2019    Lab Results  Component Value Date   WBC 1.7 (L) 08/16/2024   NEUTROABS 1.4 (L) 08/16/2024   HGB 8.4 (L) 08/16/2024   HCT 23.8 (L) 08/16/2024   MCV 91.9 08/16/2024   PLT 139 (L) 08/16/2024     STUDIES: DG Shoulder Left Result Date: 08/05/2024 EXAM: 1 VIEW(S) XRAY OF THE LEFT SHOULDER 08/05/2024 01:58:00 AM COMPARISON: None available. CLINICAL HISTORY: fall FINDINGS: BONES AND JOINTS: Glenohumeral joint is normally aligned. No acute fracture. No malalignment. The [facility]  joint is unremarkable. SOFT TISSUES: No abnormal calcifications. Visualized lung is unremarkable. IMPRESSION: 1. No acute fracture or dislocation. Electronically signed by: Morgane Naveau MD 08/05/2024 02:05 AM EST RP Workstation: [id number]   DG Chest 2 View Result Date: 08/05/2024 EXAM: 2 VIEW(S) XRAY OF THE CHEST 08/05/2024 01:58:00 AM COMPARISON: 07/24/2024 CLINICAL HISTORY: cp FINDINGS: LUNGS AND PLEURA: No focal pulmonary opacity. No pleural effusion. No pneumothorax. HEART AND MEDIASTINUM: No acute abnormality of the cardiac and mediastinal silhouettes. BONES AND SOFT TISSUES: No acute osseous abnormality. IMPRESSION: 1. No acute cardiopulmonary process. Electronically  signed by: Morgane Naveau MD 08/05/2024 02:05 AM EST RP Workstation: [id number]   DG Chest Port 1 View Result Date: 07/24/2024 EXAM: 1 VIEW(S) XRAY OF THE CHEST 07/24/2024 01:58:08 AM COMPARISON: 07/22/2024 CLINICAL HISTORY: SOB (shortness of breath) FINDINGS: LUNGS AND PLEURA: No focal pulmonary opacity. No pleural effusion. No pneumothorax. HEART AND MEDIASTINUM: No acute abnormality of the cardiac and mediastinal silhouettes. BONES AND SOFT TISSUES: No acute osseous abnormality. IMPRESSION: 1. No acute cardiopulmonary process to explain shortness of breath. Electronically signed by: Oneil Devonshire MD 07/24/2024 02:15 AM EST RP Workstation: [id number]   DG ABDOMEN PEG TUBE LOCATION Result Date: 07/24/2024 EXAM: 1 VIEW XRAY OF THE ABDOMEN 07/24/2024 01:58:08 AM COMPARISON: None available. CLINICAL HISTORY: S/P percutaneous endoscopic gastrostomy (PEG) tube placement (HCC) FINDINGS: BOWEL: Nonobstructive bowel gas pattern. Contrast material opacifies the stomach and duodenal C-loop. SOFT TISSUES: Gastrostomy tube is in place overlying the mid body of the stomach. No opaque urinary calculi. BONES: No acute osseous abnormality. IMPRESSION: 1. Gastrostomy tube appropriately positioned within the stomach with contrast opacifying the stomach  and duodenal C-loop. Electronically signed by: Oneil Devonshire MD 07/24/2024 02:13 AM EST RP Workstation: [id number]   DG Chest 2 View Result Date: 07/22/2024 EXAM: 2 VIEW(S) XRAY OF THE CHEST 07/22/2024 08:34:16 PM COMPARISON: 07/03/2024 CLINICAL HISTORY: productive cough, eval infiltrate FINDINGS: LUNGS AND PLEURA: No focal pulmonary opacity. No pleural effusion. No pneumothorax. HEART AND MEDIASTINUM: No acute abnormality of the cardiac and mediastinal silhouettes. BONES AND SOFT TISSUES: No acute osseous abnormality. IMPRESSION: 1. No acute cardiopulmonary process. 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  No further treatments are planned at this time. Pancytopenia: Likely secondary to chemotherapy and radiation received last week as well as poor nutritional status.  No intervention is needed.  Patient does not require transfusion.  Monitor. Hyperemesis: Multifactorial including anxiety.  Continue antiemetics as prescribed. Hypokalemia: Patient noted to have significantly reduced potassium levels.  Replace as appropriate. Nutrition: Patient has PEG tube in place.  She continues to report she takes minimal p.o. Disposition: Patient has been instructed to keep her previously scheduled follow-up appointment in the cancer center in approximately 2 weeks.  Appreciate consult, will follow.  Evalene JINNY Reusing, MD   08/16/2024 3:48 PM        [1]  Social History Tobacco Use   Smoking status: Former    Types: E-cigarettes   Smokeless tobacco: Never  Vaping Use   Vaping status: Former  Substance Use Topics   Alcohol use: Not Currently    Comment: social   Drug use: Yes    Types: Marijuana  [2]  Allergies Allergen Reactions   Shellfish Allergy Anaphylaxis    Tomato Other (See Comments)    Flares up exema   Droperidol  Anxiety    Felt jittery and became tachycardic after droperidol 

## 2024-08-16 NOTE — Assessment & Plan Note (Signed)
-   We will replace potassium and follow level. 

## 2024-08-16 NOTE — Progress Notes (Signed)
 Patient with a history of head and neck cancer status post chemotherapy and radiation therapy who presents to the ER for intractable nausea and vomiting with electrolyte abnormalities.  Patient noted to have pancytopenia most likely related to her recent treatment. Supplement magnesium  and potassium Supportive care with antiemetics and IV fluid hydration

## 2024-08-16 NOTE — Consult Note (Signed)
 [facility]IN[facility] CARDIOLOGY CONSULT NOTE       Patient ID: Brandi [name] MRN: [id number] DOB/AGE: 1996-02-26 28 y.o.  Admit date: 08/16/2024 Referring Physician Dr. Lanetta Primary Physician Osei-Bonsu, Zachary, MD  Primary Cardiologist None Reason for Consultation ?atrial fibrillation  HPI: Brandi [name] is a 28 y.o. female  with a past medical history of asthma, GERD, essential hypertension, cannabinoid hyperemesis syndrome and head and neck cancer status postchemotherapy and radiotherapy who presented to the ED on 08/16/2024 for nausea/vomiting. EKG overnight in the ED with concern for afib. Cardiology was consulted for further evaluation.   Patient presented to the ED for evaluation due to having issues with her feeding tube as well as nausea and vomiting.  Workup in the ED notable for creatinine 0.83, potassium 3.0 > 2.7, magnesium  1.2, hemoglobin 9.2, WBC 1.8. EKG in the ED sinus tach rate 114 bpm, repeat shows aflutter vs more likely sinus tach rate 140 bpm. No imaging obtained in the ED.   At the time of my evaluation this afternoon, she is sitting upright in hospital bed.  We discussed her presentation in further detail.  She states that in September she was diagnosed with cancer and began chemotherapy and radiation which she has since completed.  Feeding tube was placed in November.  She states that otherwise recently she has been doing relatively well.  Endorses onset of nausea/vomiting yesterday and was having issues with her feeding tube prompting her hesitation.  Today she reports feeling generally unwell.  Endorses tingling in her legs as well as pain.  Also endorses intermittent left hand tingling.  Heart rate has been elevated but she denies any palpitations.  Does endorse some chest discomfort that comes and goes.  Denies any shortness of breath, lightness, dizziness, syncope.  Review of systems complete and found to be negative unless listed above    Past Medical History:   Diagnosis Date   Asthma    Cancer (HCC)    head and neck, getting radiation   GERD (gastroesophageal reflux disease)    HTN (hypertension)     Past Surgical History:  Procedure Laterality Date   dental procedure     IR GASTROSTOMY TUBE MOD SED  07/05/2024   IR US  LIVER BIOPSY  05/17/2024    Medications Prior to Admission  Medication Sig Dispense Refill Last Dose/Taking   ALPRAZolam  (XANAX ) 0.25 MG tablet Take 1 tablet (0.25 mg total) by mouth at bedtime as needed for anxiety. 30 tablet 0 Taking As Needed   amLODipine  (NORVASC ) 5 MG tablet Place 1 tablet (5 mg total) into feeding tube daily. 30 tablet 0 08/15/2024   LORazepam  (ATIVAN ) 0.5 MG tablet Take 0.5 mg by mouth every 8 (eight) hours as needed for anxiety.   Taking As Needed   omeprazole  (PRILOSEC) 20 MG capsule Take 1 capsule (20 mg total) by mouth daily. Open capsule pour into water  administer through tube flush with water  after administration 30 capsule 1 08/15/2024   ondansetron  (ZOFRAN ) 4 MG tablet Place 2 tablets (8 mg total) into feeding tube every 8 (eight) hours as needed for nausea or vomiting. 30 tablet 0 Taking As Needed   oxycodone  (OXY-IR) 5 MG capsule Take 5 mg by mouth every 4 (four) hours as needed.   Taking As Needed   pantoprazole  (PROTONIX ) 40 MG tablet Take 40 mg by mouth daily.   08/15/2024   potassium chloride  20 MEQ/15ML (10%) SOLN Take 15 mLs (20 mEq total) by mouth daily. 473 mL 2 08/15/2024  prochlorperazine  (COMPAZINE ) 10 MG tablet Take 1 tablet (10 mg total) by mouth every 8 (eight) hours as needed for nausea or vomiting. 30 tablet 0 08/15/2024   SLYND  4 MG TABS Take 1 tablet by mouth daily.   Taking   cyclobenzaprine  (FLEXERIL ) 10 MG tablet Take 1 tablet (10 mg total) by mouth 3 (three) times daily as needed for muscle spasms. (Patient not taking: No sig reported) 90 tablet 0 Not Taking   lactulose  (CHRONULAC ) 10 GM/15ML solution Take 45 mLs (30 g total) by mouth daily as needed for severe constipation.  (Patient not taking: Reported on 08/16/2024) 473 mL 0 Not Taking   metoCLOPramide  (REGLAN ) 5 MG/5ML solution Take 10 mLs (10 mg total) by mouth every 6 (six) hours as needed for nausea or vomiting. (Patient not taking: Reported on 08/16/2024) 120 mL 1 Not Taking   Nutritional Supplements (ENSURE CLEAR) LIQD Take 1 Bottle by mouth 2 (two) times daily. 296 mL 6    Nutritional Supplements (FEEDING SUPPLEMENT, OSMOLITE 1.5 CAL,) LIQD Place 119 mLs into feeding tube 6 (six) times daily.      phenol (CHLORASEPTIC) 1.4 % LIQD Use as directed 1 spray in the mouth or throat as needed for throat irritation / pain. (Patient not taking: No sig reported)   Not Taking   Water  For Irrigation, Sterile (FREE WATER ) SOLN Place 60 mLs into feeding tube 6 (six) times daily. (Patient not taking: Reported on 08/15/2024)      Social History   Socioeconomic History   Marital status: Significant Other    Spouse name: Not on file   Number of children: Not on file   Years of education: Not on file   Highest education level: Not on file  Occupational History   Not on file  Tobacco Use   Smoking status: Former    Types: E-cigarettes   Smokeless tobacco: Never  Vaping Use   Vaping status: Former  Substance and Sexual Activity   Alcohol use: Not Currently    Comment: social   Drug use: Yes    Types: Marijuana   Sexual activity: Yes    Partners: Female    Birth control/protection: None  Other Topics Concern   Not on file  Social History Narrative   Not on file   Social Drivers of Health   Tobacco Use: Medium Risk (08/16/2024)   Patient History    Smoking Tobacco Use: Former    Smokeless Tobacco Use: Never    Passive Exposure: Not on Actuary Strain: Not on file  Food Insecurity: No Food Insecurity (08/15/2024)   Epic    Worried About Programme Researcher, Broadcasting/film/video in the Last Year: Never true    Ran Out of Food in the Last Year: Never true  Recent Concern: Food Insecurity - Food Insecurity  Present (06/29/2024)   Epic    Worried About Programme Researcher, Broadcasting/film/video in the Last Year: Sometimes true    Ran Out of Food in the Last Year: Sometimes true  Transportation Needs: No Transportation Needs (08/15/2024)   Epic    Lack of Transportation (Medical): No    Lack of Transportation (Non-Medical): No  Recent Concern: Transportation Needs - Unmet Transportation Needs (07/31/2024)   Epic    Lack of Transportation (Medical): Yes    Lack of Transportation (Non-Medical): No  Physical Activity: Not on file  Stress: Not on file  Social Connections: Patient Declined (07/28/2024)   Social Connection and Isolation Panel    Frequency  of Communication with Friends and Family: Patient declined    Frequency of Social Gatherings with Friends and Family: Patient declined    Attends Religious Services: Patient declined    Active Member of Clubs or Organizations: Patient declined    Attends Banker Meetings: Patient declined    Marital Status: Patient declined  Intimate Partner Violence: Not At Risk (08/15/2024)   Epic    Fear of Current or Ex-Partner: No    Emotionally Abused: No    Physically Abused: No    Sexually Abused: No  Depression (PHQ2-9): Low Risk (08/15/2024)   Depression (PHQ2-9)    PHQ-2 Score: 1  Recent Concern: Depression (PHQ2-9) - Medium Risk (08/11/2024)   Depression (PHQ2-9)    PHQ-2 Score: 6  Alcohol Screen: Low Risk (05/30/2024)   Alcohol Screen    Last Alcohol Screening Score (AUDIT): 0  Housing: Low Risk (08/15/2024)   Epic    Unable to Pay for Housing in the Last Year: No    Number of Times Moved in the Last Year: 0    Homeless in the Last Year: No  Utilities: Not At Risk (08/15/2024)   Epic    Threatened with loss of utilities: No  Health Literacy: Not on file    Family History  Problem Relation Age of Onset   Hypertension Maternal Grandmother    Diabetes Maternal Grandmother      Vitals:   08/16/24 0418 08/16/24 0511 08/16/24 0911 08/16/24 1311   BP: (!) 153/96 (!) 140/77 136/85 (!) 122/102  Pulse: (!) 125 (!) 118 (!) 123 (!) 117  Resp: 20  19 19   Temp: 98.9 F (37.2 C) 98 F (36.7 C) 98.2 F (36.8 C) 98.9 F (37.2 C)  TempSrc: Oral Oral Oral Oral  SpO2: 100% 98% 100% 100%  Weight: 88.5 kg     Height: 5' 2 (1.575 m)       PHYSICAL EXAM General: Young female, well nourished, in no acute distress. HEENT: Normocephalic and atraumatic. Neck: No JVD.  Lungs: Normal respiratory effort on room air. Clear bilaterally to auscultation. No wheezes, crackles, rhonchi.  Heart: HRR, elevated rate. Normal S1 and S2 without gallops or murmurs.  Abdomen: Non-distended appearing.  Msk: Normal strength and tone for age. Extremities: Warm and well perfused. No clubbing, cyanosis.  No edema.  Neuro: Alert and oriented X 3. Psych: Answers questions appropriately.   Labs: Basic Metabolic Panel: Recent Labs    08/16/24 0110 08/16/24 0613  NA 131* 135  K 3.0* 2.7*  CL 89* 96*  CO2 24 26  GLUCOSE 126* 115*  BUN 8 6  CREATININE 0.83 0.70  CALCIUM 9.7 9.0  MG 1.2*  --    Liver Function Tests: Recent Labs    08/16/24 0110  AST 27  ALT 31  ALKPHOS 71  BILITOT 0.5  PROT 6.9  ALBUMIN 4.0   Recent Labs    08/16/24 0110  LIPASE 12   CBC: Recent Labs    08/16/24 0110 08/16/24 0613  WBC 1.8* 1.7*  NEUTROABS 1.4*  --   HGB 9.2* 8.4*  HCT 25.8* 23.8*  MCV 92.1 91.9  PLT 149* 139*   Cardiac Enzymes: No results for input(s): CKTOTAL, CKMB, CKMBINDEX, TROPONINIHS in the last 72 hours. BNP: No results for input(s): BNP in the last 72 hours. D-Dimer: No results for input(s): DDIMER in the last 72 hours. Hemoglobin A1C: No results for input(s): HGBA1C in the last 72 hours. Fasting Lipid Panel: No results for input(s):  CHOL, HDL, LDLCALC, TRIG, CHOLHDL, LDLDIRECT in the last 72 hours. Thyroid  Function Tests: No results for input(s): TSH, T4TOTAL, T3FREE, THYROIDAB in the last 72  hours.  Invalid input(s): FREET3 Anemia Panel: No results for input(s): VITAMINB12, FOLATE, FERRITIN, TIBC, IRON, RETICCTPCT in the last 72 hours.   Radiology: DG Shoulder Left Result Date: 08/05/2024 EXAM: 1 VIEW(S) XRAY OF THE LEFT SHOULDER 08/05/2024 01:58:00 AM COMPARISON: None available. CLINICAL HISTORY: fall FINDINGS: BONES AND JOINTS: Glenohumeral joint is normally aligned. No acute fracture. No malalignment. The [facility] joint is unremarkable. SOFT TISSUES: No abnormal calcifications. Visualized lung is unremarkable. IMPRESSION: 1. No acute fracture or dislocation. Electronically signed by: Morgane Naveau MD 08/05/2024 02:05 AM EST RP Workstation: [id number]   DG Chest 2 View Result Date: 08/05/2024 EXAM: 2 VIEW(S) XRAY OF THE CHEST 08/05/2024 01:58:00 AM COMPARISON: 07/24/2024 CLINICAL HISTORY: cp FINDINGS: LUNGS AND PLEURA: No focal pulmonary opacity. No pleural effusion. No pneumothorax. HEART AND MEDIASTINUM: No acute abnormality of the cardiac and mediastinal silhouettes. BONES AND SOFT TISSUES: No acute osseous abnormality. IMPRESSION: 1. No acute cardiopulmonary process. Electronically signed by: Morgane Naveau MD 08/05/2024 02:05 AM EST RP Workstation: [id number]   DG Chest Port 1 View Result Date: 07/24/2024 EXAM: 1 VIEW(S) XRAY OF THE CHEST 07/24/2024 01:58:08 AM COMPARISON: 07/22/2024 CLINICAL HISTORY: SOB (shortness of breath) FINDINGS: LUNGS AND PLEURA: No focal pulmonary opacity. No pleural effusion. No pneumothorax. HEART AND MEDIASTINUM: No acute abnormality of the cardiac and mediastinal silhouettes. BONES AND SOFT TISSUES: No acute osseous abnormality. IMPRESSION: 1. No acute cardiopulmonary process to explain shortness of breath. Electronically signed by: Oneil Devonshire MD 07/24/2024 02:15 AM EST RP Workstation: [id number]   DG ABDOMEN PEG TUBE LOCATION Result Date: 07/24/2024 EXAM: 1 VIEW XRAY OF THE ABDOMEN 07/24/2024 01:58:08 AM COMPARISON: None available.  CLINICAL HISTORY: S/P percutaneous endoscopic gastrostomy (PEG) tube placement (HCC) FINDINGS: BOWEL: Nonobstructive bowel gas pattern. Contrast material opacifies the stomach and duodenal C-loop. SOFT TISSUES: Gastrostomy tube is in place overlying the mid body of the stomach. No opaque urinary calculi. BONES: No acute osseous abnormality. IMPRESSION: 1. Gastrostomy tube appropriately positioned within the stomach with contrast opacifying the stomach and duodenal C-loop. Electronically signed by: Oneil Devonshire MD 07/24/2024 02:13 AM EST RP Workstation: [id number]   DG Chest 2 View Result Date: 07/22/2024 EXAM: 2 VIEW(S) XRAY OF THE CHEST 07/22/2024 08:34:16 PM COMPARISON: 07/03/2024 CLINICAL HISTORY: productive cough, eval infiltrate FINDINGS: LUNGS AND PLEURA: No focal pulmonary opacity. No pleural effusion. No pneumothorax. HEART AND MEDIASTINUM: No acute abnormality of the cardiac and mediastinal silhouettes. BONES AND SOFT TISSUES: No acute osseous abnormality. IMPRESSION: 1. No acute cardiopulmonary process. Electronically signed by: Elsie Gravely MD 07/22/2024 08:39 PM EST RP Workstation: [id number]    ECHO ordered  TELEMETRY (personally reviewed): Sinus tach 110s  EKG (personally reviewed): sinus tach rate 114 bpm, repeat shows aflutter vs more likely sinus tach rate 140 bpm  Data reviewed by me 08/16/2024: last 24h vitals tele labs imaging I/O ED provider note, admission H&P  Principal Problem:   Intractable nausea and vomiting Active Problems:   Hypokalemia   Hyponatremia   Hypomagnesemia   Essential hypertension   Anxiety   GERD without esophagitis   Pancytopenia (HCC)    ASSESSMENT AND PLAN:  Brandi [name] is a 28 y.o. female  with a past medical history of asthma, GERD, essential hypertension, cannabinoid hyperemesis syndrome and head and neck cancer s/p chemotherapy and radiation who presented to the ED on 08/16/2024 for nausea/vomiting. EKG overnight in the  ED with concern  for afib. Cardiology was consulted for further evaluation.   # Sinus tachycardia # Electrolyte derangements # Stage III EBV positive nasopharyngeal carcinoma, s/p chemo & radiation # Hypertension Patient presented due to nausea/vomiting, feeding tube issue. Noted to be tachycardic in the ED, EKGs sinus tach rate 114 bpm, repeat shows aflutter vs more likely sinus tach rate 140 bpm. K 3.0 on admission down to 2.7 on AM labs. Magnesium  1.2 on admission. Severe leukopenia with WBC 1.8.  -Echo ordered, further recommendations pending these results.  -Low suspicion for AF/AFL given age, lack of risk factors. Suspect sinus tachycardia 2/2 underlying electrolyte derangements.  -Monitor and replenish electrolytes for a goal K >4, Mag >2    -Would recommend ruling out PE given her hx of cancer and now tachycardia.    This patient's plan of care was discussed and created with Dr. Florencio and he is in agreement.  Signed: Danita Bloch, PA-C  08/16/2024, 3:04 PM [facility]

## 2024-08-16 NOTE — Assessment & Plan Note (Signed)
-   Will continue her benzodiazepine therapy.

## 2024-08-16 NOTE — Progress Notes (Signed)
 cbc

## 2024-08-16 NOTE — H&P (Addendum)
 [place]   PATIENT NAME: Brandi [name]    MR#:  [id number]  DATE OF BIRTH:  22-Dec-1995  DATE OF ADMISSION:  08/16/2024  PRIMARY CARE PHYSICIAN: [name], MD   Patient is coming from: Home  REQUESTING/REFERRING PHYSICIAN: Ward, Josette SAILOR, DO  CHIEF COMPLAINT:   Chief Complaint  Patient presents with   feeding tube problem    HISTORY OF PRESENT ILLNESS:  Brandi [name] is a 28 y.o. 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  She admitted to dark vomitus and not typically coffee-ground.  She thought her feeding tube was clogged.  Her family tried to flush it slowly at home.  She has throat pain that is chronic.  No fever or chills.  No bilious vomitus or hematemesis.  No diarrhea or melena or bright red in per rectum.  No dysuria, oliguria or hematuria or flank pain.  ED Course: When she came to the ER, BP was 129/91, with heart rate of 131 with temperature of 99.3 and otherwise normal vital signs.  Labs revealed hyponatremia 131 and hypochloremia of 89 with hypokalemia 3, anion gap of 19 and magnesium  of 1.2 with otherwise unremarkable CMP.  CBC showed leukopenia 1.8 compared to 2.5 on 12/12 with ANC 1.4 compared to 2.2 then, hemoglobin 9.2 and hematocrit 25.8 stable from 12/12 and platelets 149 compared to 142 then.  Serum pregnancy test was negative.  Blood cultures were drawn. EKG as reviewed by me : EKG showed sinus tachycardia with rate 114 flipped T waves anteroseptally and Q waves inferiorly.  Imaging: None.  The patient was given 2 g IV magnesium  sulfate, Chloraseptic spray, 2 L bolus of IV normal saline, 10 mill equivalents IV potassium chloride  and 1 mg of IV Ativan  and 2.5 mg of IV Inapsine .  She  will be admitted to AN observation telemetry bed for further evaluation and management. PAST MEDICAL HISTORY:   Past Medical History:  Diagnosis Date   Asthma    Cancer (HCC)    head and neck, getting radiation   GERD (gastroesophageal reflux disease)    HTN (hypertension)     PAST SURGICAL HISTORY:   Past Surgical History:  Procedure Laterality Date   dental procedure     IR GASTROSTOMY TUBE MOD SED  07/05/2024   IR US  LIVER BIOPSY  05/17/2024    SOCIAL HISTORY:   Social History   Tobacco Use   Smoking status: Former    Types: E-cigarettes   Smokeless tobacco: Never  Substance Use Topics   Alcohol use: Not Currently    Comment: social    FAMILY HISTORY:   Family History  Problem Relation Age of Onset   Hypertension Maternal Grandmother    Diabetes Maternal Grandmother     DRUG ALLERGIES:  Allergies[1]  REVIEW OF SYSTEMS:   ROS As per history of present illness. All pertinent systems were reviewed above. Constitutional, HEENT, cardiovascular, respiratory, GI, GU, musculoskeletal, neuro, psychiatric, endocrine, integumentary and hematologic systems were reviewed and are otherwise negative/unremarkable except for positive findings mentioned above in the HPI.   MEDICATIONS AT HOME:   Prior to Admission medications  Medication Sig Start Date End Date Taking? Authorizing Provider  ALPRAZolam  (XANAX ) 0.25 MG tablet Take 1 tablet (0.25 mg total) by mouth at bedtime as  needed for anxiety. 08/11/24  Yes [name], MD  amLODipine  (NORVASC ) 5 MG tablet Place 1 tablet (5 mg total) into feeding tube daily. 08/03/24  Yes [name], MD  LORazepam  (ATIVAN ) 0.5 MG tablet Take 0.5 mg by mouth every 8 (eight) hours as needed for anxiety.   Yes [provider]  omeprazole  (PRILOSEC) 20 MG capsule Take 1 capsule (20 mg total) by mouth daily. Open capsule pour into water  administer through tube flush with water  after administration 08/11/24 10/10/24 Yes  Lanell Jacobsen, NP  ondansetron  (ZOFRAN ) 4 MG tablet Place 2 tablets (8 mg total) into feeding tube every 8 (eight) hours as needed for nausea or vomiting. 07/27/24  Yes Ray, Nilsa, MD  oxycodone  (OXY-IR) 5 MG capsule Take 5 mg by mouth every 4 (four) hours as needed.   Yes [provider]  pantoprazole  (PROTONIX ) 40 MG tablet Take 40 mg by mouth daily. 08/07/24  Yes [provider]  potassium chloride  20 MEQ/15ML (10%) SOLN Take 15 mLs (20 mEq total) by mouth daily. 08/11/24  Yes Lanell Jacobsen, NP  prochlorperazine  (COMPAZINE ) 10 MG tablet Take 1 tablet (10 mg total) by mouth every 8 (eight) hours as needed for nausea or vomiting. 07/17/24  Yes Borders, Fonda SAUNDERS, NP  SLYND  4 MG TABS Take 1 tablet by mouth daily. 05/20/24  Yes [provider]  cyclobenzaprine  (FLEXERIL ) 10 MG tablet Take 1 tablet (10 mg total) by mouth 3 (three) times daily as needed for muscle spasms. Patient not taking: No sig reported 06/14/24   [name], MD  lactulose  (CHRONULAC ) 10 GM/15ML solution Take 45 mLs (30 g total) by mouth daily as needed for severe constipation. Patient not taking: Reported on 08/16/2024 07/09/24   Josette Ade, MD  metoCLOPramide  (REGLAN ) 5 MG/5ML solution Take 10 mLs (10 mg total) by mouth every 6 (six) hours as needed for nausea or vomiting. Patient not taking: Reported on 08/16/2024 08/01/24 08/31/24  Cox, Amy N, DO  Nutritional Supplements (ENSURE CLEAR) LIQD Take 1 Bottle by mouth 2 (two) times daily. 07/13/24   [name], MD  Nutritional Supplements (FEEDING SUPPLEMENT, OSMOLITE 1.5 CAL,) LIQD Place 119 mLs into feeding tube 6 (six) times daily. 07/09/24   Wieting, Richard, MD  phenol (CHLORASEPTIC) 1.4 % LIQD Use as directed 1 spray in the mouth or throat as needed for throat irritation / pain. Patient not taking: No sig reported 06/27/24   Awanda City, MD  Water  For Irrigation, Sterile (FREE WATER ) SOLN Place 60 mLs into feeding tube 6 (six)  times daily. Patient not taking: Reported on 08/15/2024 07/09/24   Josette Ade, MD      VITAL SIGNS:  Blood pressure (!) 140/77, pulse (!) 118, temperature 98 F (36.7 C), temperature source Oral, resp. rate 20, height 5' 2 (1.575 m), weight 88.5 kg, SpO2 98%.  PHYSICAL EXAMINATION:  Physical Exam  GENERAL:  28 y.o.-year-old African-American patient lying in the bed with no acute distress.  EYES: Pupils equal, round, reactive to light and accommodation. No scleral icterus. Extraocular muscles intact.  HEENT: Head atraumatic, normocephalic. Oropharynx and nasopharynx clear.  NECK:  Supple, no jugular venous distention. No thyroid  enlargement, no tenderness.  LUNGS: Normal breath sounds bilaterally, no wheezing, rales,rhonchi or crepitation. No use of accessory muscles of respiration.  CARDIOVASCULAR: Regular rate and rhythm, S1, S2 normal. No murmurs, rubs, or gallops.  ABDOMEN: Soft, nondistended, nontender. Bowel sounds present. No organomegaly or mass.  EXTREMITIES: No pedal edema, cyanosis, or clubbing.  NEUROLOGIC: Cranial nerves II through XII are intact. Muscle strength 5/5 in all extremities. Sensation intact. Gait not checked.  PSYCHIATRIC: The patient is alert and oriented x 3.  Normal affect and good eye contact. SKIN: No obvious rash, lesion, or ulcer.   LABORATORY PANEL:   CBC Recent Labs  Lab 08/16/24 0613  WBC 1.7*  HGB 8.4*  HCT 23.8*  PLT 139*   ------------------------------------------------------------------------------------------------------------------  Chemistries  Recent Labs  Lab 08/16/24 0110  NA 131*  K 3.0*  CL 89*  CO2 24  GLUCOSE 126*  BUN 8  CREATININE 0.83  CALCIUM 9.7  MG 1.2*  AST 27  ALT 31  ALKPHOS 71  BILITOT 0.5   ------------------------------------------------------------------------------------------------------------------  Cardiac Enzymes No results for input(s): TROPONINI in the last 168  hours. ------------------------------------------------------------------------------------------------------------------  RADIOLOGY:  No results found.    IMPRESSION AND PLAN:  Assessment and Plan: * Intractable nausea and vomiting - The patient will be admitted to an observation telemetry bed. - This is like secondary to recent chemotherapy for hypokalemia - Will continue hydration with IV normal saline with added potassium chloride . - Clear liquids will be provided to be advanced as tolerated. - Given suspected dark vomitus will place her on IV Protonix  and obtain Gastroccult. - As needed antiemetics will be provided.   Hypokalemia - We will replace potassium and follow level.  Hypomagnesemia - Will replace magnesium .  Hyponatremia - This is likely hypovolemic due to intractable nausea and vomiting. - She will be hydrated with IV normal saline with added potassium chloride . - Will follow BMP  Pancytopenia (HCC) - This is secondary to her recent chemotherapy. - Oncology consult to be obtained. - I notified Dr. Babara about the patient.  GERD without esophagitis - The patient will be placed on PPI therapy as mentioned above.  Anxiety - Will continue her benzodiazepine therapy.  Essential hypertension - Will continue antihypertensive therapy.       DVT prophylaxis: Lovenox .  Advanced Care Planning:  Code Status: full code.  Family Communication:  The plan of care was discussed in details with the patient (and family). I answered all questions. The patient agreed to proceed with the above mentioned plan. Further management will depend upon hospital course. Disposition Plan: Back to previous home environment Consults called: Oncology. All the records are reviewed and case discussed with ED provider.  Status is: Observation  I certify that at the time of admission, it is my clinical judgment that the patient will require hospital care extending than 2 midnights.                             Dispo: The patient is from: Home              Anticipated d/c is to: Home              Patient currently is not medically stable to d/c.              Difficult to place patient: No  Madison DELENA Peaches M.D on 08/16/2024 at 6:41 AM  [facility]   From 7 PM-7 AM, contact night-coverage [email or web address]  CC: Primary care physician; [name], MD     [1]  Allergies Allergen Reactions   Shellfish Allergy Anaphylaxis   Tomato Other (See Comments)    Flares up exema   Droperidol  Anxiety    Felt jittery and became tachycardic after droperidol 

## 2024-08-16 NOTE — ED Triage Notes (Signed)
 Patient ambulatory to triage with steady gait, without difficulty or distress noted; pt reports possible feeding tube clogged; st she normally does gravity admin and after medication this evening it would not work

## 2024-08-16 NOTE — ED Provider Notes (Signed)
 [facility] Provider Note    Event Date/Time   First MD Initiated Contact with Patient 08/16/24 0025     (approximate)   History   feeding tube problem   HPI  Jahmya Onofrio is a 28 y.o. female with history of nasopharyngeal cancer, hypertension, GERD, cannabinoid hyperemesis syndrome, asthma who presents to the emergency department with her feeding tube being clogged.  Family reports they tried to flush it slowly at home.  States she normally keeps it to gravity.  Reports chronic throat pain today.   History provided by patient, family.    Past Medical History:  Diagnosis Date   Asthma    Cancer (HCC)    head and neck, getting radiation   GERD (gastroesophageal reflux disease)    HTN (hypertension)     Past Surgical History:  Procedure Laterality Date   dental procedure     IR GASTROSTOMY TUBE MOD SED  07/05/2024   IR US  LIVER BIOPSY  05/17/2024    MEDICATIONS:  Prior to Admission medications  Medication Sig Start Date End Date Taking? Authorizing Provider  ALPRAZolam  (XANAX ) 0.25 MG tablet Take 1 tablet (0.25 mg total) by mouth at bedtime as needed for anxiety. 08/11/24   [name], MD  amLODipine  (NORVASC ) 5 MG tablet Place 1 tablet (5 mg total) into feeding tube daily. 08/03/24   [name], MD  cyclobenzaprine  (FLEXERIL ) 10 MG tablet Take 1 tablet (10 mg total) by mouth 3 (three) times daily as needed for muscle spasms. Patient not taking: Reported on 08/11/2024 06/14/24   [name], MD  lactulose  (CHRONULAC ) 10 GM/15ML solution Take 45 mLs (30 g total) by mouth daily as needed for severe constipation. 07/09/24   Josette Ade, MD  metoCLOPramide  (REGLAN ) 5 MG/5ML solution Take 10 mLs (10 mg total) by mouth every 6 (six) hours as needed for nausea or vomiting. 08/01/24 08/31/24  Cox, Amy N, DO  Nutritional Supplements (ENSURE CLEAR) LIQD Take 1 Bottle by mouth 2 (two) times daily. 07/13/24   [name], MD   Nutritional Supplements (FEEDING SUPPLEMENT, OSMOLITE 1.5 CAL,) LIQD Place 119 mLs into feeding tube 6 (six) times daily. 07/09/24   Josette Ade, MD  omeprazole  (PRILOSEC) 20 MG capsule Take 1 capsule (20 mg total) by mouth daily. Open capsule pour into water  administer through tube flush with water  after administration 08/11/24 10/10/24  Lanell Jacobsen, NP  ondansetron  (ZOFRAN ) 4 MG tablet Place 2 tablets (8 mg total) into feeding tube every 8 (eight) hours as needed for nausea or vomiting. 07/27/24   Levander Slate, MD  phenol (CHLORASEPTIC) 1.4 % LIQD Use as directed 1 spray in the mouth or throat as needed for throat irritation / pain. Patient not taking: Reported on 08/11/2024 06/27/24   Awanda City, MD  potassium chloride  20 MEQ/15ML (10%) SOLN Take 15 mLs (20 mEq total) by mouth daily. 08/11/24   Lanell Jacobsen, NP  prochlorperazine  (COMPAZINE ) 10 MG tablet Take 1 tablet (10 mg total) by mouth every 8 (eight) hours as needed for nausea or vomiting. 07/17/24   Borders, Fonda SAUNDERS, NP  SLYND  4 MG TABS Take 1 tablet by mouth daily. Patient not taking: Reported on 08/15/2024 05/20/24   [provider]  Water  For Irrigation, Sterile (FREE WATER ) SOLN Place 60 mLs into feeding tube 6 (six) times daily. Patient not taking: Reported on 08/15/2024 07/09/24   Josette Ade, MD    Physical Exam   Triage Vital Signs: ED Triage Vitals  Encounter Vitals Group     BP 08/16/24 0023 (!) 129/91     Girls Systolic BP Percentile --      Girls Diastolic BP Percentile --      Boys Systolic BP Percentile --      Boys Diastolic BP Percentile --      Pulse Rate 08/16/24 0023 (!) 131     Resp 08/16/24 0023 18     Temp 08/16/24 0023 99.3 F (37.4 C)     Temp Source 08/16/24 0023 Oral     SpO2 08/16/24 0023 100 %     Weight 08/16/24 0022 185 lb 3 oz (84 kg)     Height 08/16/24 0022 5' 2 (1.575 m)     Head Circumference --      Peak Flow --      Pain Score --      Pain Loc --      Pain  Education --      Exclude from Growth Chart --     Most recent vital signs: Vitals:   08/16/24 0115 08/16/24 0230  BP:    Pulse: (!) 106   Resp:    Temp:  99.4 F (37.4 C)  SpO2: 100%     CONSTITUTIONAL: Alert, responds appropriately to questions.  Chronically ill-appearing HEAD: Normocephalic, atraumatic EYES: Conjunctivae clear, pupils appear equal, sclera nonicteric ENT: normal nose; dry appearing mucous membranes NECK: Supple, normal ROM CARD: Regular and tachycardic; S1 and S2 appreciated RESP: Normal chest excursion without splinting or tachypnea; breath sounds clear and equal bilaterally; no wheezes, no rhonchi, no rales, no hypoxia or respiratory distress, speaking full sentences ABD/GI: Non-distended; soft, non-tender, no rebound, no guarding, no peritoneal signs, G-tube in site without surrounding redness, warmth, drainage BACK: The back appears normal EXT: Normal ROM in all joints; no deformity noted, no edema SKIN: Normal color for age and race; warm; no rash on exposed skin NEURO: Moves all extremities equally, normal speech PSYCH: The patient's mood and manner are appropriate.   ED Results / Procedures / Treatments   LABS: (all labs ordered are listed, but only abnormal results are displayed) Labs Reviewed  CBC WITH DIFFERENTIAL/PLATELET - Abnormal; Notable for the following components:      Result Value   WBC 1.8 (*)    RBC 2.80 (*)    Hemoglobin 9.2 (*)    HCT 25.8 (*)    RDW 19.2 (*)    Platelets 149 (*)    Neutro Abs 1.4 (*)    Lymphs Abs 0.2 (*)    All other components within normal limits  COMPREHENSIVE METABOLIC PANEL WITH GFR - Abnormal; Notable for the following components:   Sodium 131 (*)    Potassium 3.0 (*)    Chloride 89 (*)    Glucose, Bld 126 (*)    Anion gap 19 (*)    All other components within normal limits  MAGNESIUM  - Abnormal; Notable for the following components:   Magnesium  1.2 (*)    All other components within normal  limits  CULTURE, BLOOD (ROUTINE X 2)  CULTURE, BLOOD (ROUTINE X 2)  LIPASE, BLOOD  HCG, QUANTITATIVE, PREGNANCY  URINALYSIS, ROUTINE W REFLEX MICROSCOPIC  URINE DRUG SCREEN     EKG:   Date: 08/16/2024  Rate: 114  Rhythm: Sinus tachycardia  QRS Axis: normal  Intervals: normal  ST/T Wave abnormalities: normal  Conduction Disutrbances: none  Narrative Interpretation: Sinus tachycardia, nonspecific T wave flattening diffusely, QTc 477 ms  RADIOLOGY: My personal review and interpretation of imaging:    I have personally reviewed all radiology reports.   No results found.   PROCEDURES:  Critical Care performed: Yes, see critical care procedure note(s)   CRITICAL CARE Performed by: Josette Sink   Total critical care time: 30 minutes  Critical care time was exclusive of separately billable procedures and treating other patients.  Critical care was necessary to treat or prevent imminent or life-threatening deterioration.  Critical care was time spent personally by me on the following activities: development of treatment plan with patient and/or surrogate as well as nursing, discussions with consultants, evaluation of patient's response to treatment, examination of patient, obtaining history from patient or surrogate, ordering and performing treatments and interventions, ordering and review of laboratory studies, ordering and review of radiographic studies, pulse oximetry and re-evaluation of patient's condition.   SABRA1-3 Lead EKG Interpretation  Performed by: Marlene Pfluger, Josette SAILOR, DO Authorized by: Latoya Diskin, Josette SAILOR, DO     Interpretation: abnormal     ECG rate:  106   ECG rate assessment: tachycardic     Rhythm: sinus tachycardia     Ectopy: none     Conduction: normal       IMPRESSION / MDM / ASSESSMENT AND PLAN / ED COURSE  I reviewed the triage vital signs and the nursing notes.    Patient here with complaints of feeding tube not working properly.  She  is n.p.o. and gets all of her medications and nutrition by her G-tube.  History of nasopharyngeal cancer.   DIFFERENTIAL DIAGNOSIS (includes but not limited to):   G-tube clogged, G-tube does not appear to be dislodged.  Patient is tachycardic and has dry mucous membranes on exam but often presents similarly.  I feel that if we get her G-tube working we can treat her chronic pain and hydrate her through her G-tube at home.   Patient's presentation is most consistent with acute presentation with potential threat to life or bodily function.   PLAN: Will attempt to irrigate G-tube to see if we can get it functioning again.  If unsuccessful, discussed with patient that we may need to exchange in the emergency department.  Will provide with pain and nausea medicine for her chronic pain and nausea from her nasopharyngeal cancer.   MEDICATIONS GIVEN IN ED: Medications  phenol (CHLORASEPTIC) mouth spray 1 spray (has no administration in time range)  potassium chloride  10 mEq in 100 mL IVPB (has no administration in time range)  magnesium  sulfate IVPB 2 g 50 mL (has no administration in time range)  sodium chloride  0.9 % bolus 1,000 mL (1,000 mLs Intravenous New Bag/Given 08/16/24 0121)  droperidol  (INAPSINE ) 2.5 MG/ML injection 2.5 mg (2.5 mg Intravenous Given 08/16/24 0119)  sodium chloride  0.9 % bolus 1,000 mL (1,000 mLs Intravenous New Bag/Given 08/16/24 0119)  LORazepam  (ATIVAN ) injection 1 mg (1 mg Intravenous Given 08/16/24 0214)     ED COURSE: G-tube flushes fine without any difficulty per nursing staff.  Now family states that they need IV fluids and pain medication because she is having uncontrolled pain, vomiting at home and is dehydrated and needs treatment.  Patient is well-known to our emergency department and has an active care plan for similar symptoms.  She has been admitted before for electrolyte derangements due to dehydration.  Of note, there has been concern for cannabinoid  hyperemesis syndrome and patient continues to test positive for THC.  She has had symptoms of nausea and vomiting even  prior to her cancer diagnosis.  Will obtain labs, urine and give IV fluids, droperidol  for pain and nausea.  2:26 AM  Pt reports about 30 to 45 minutes after receiving droperidol  that she became very jittery and heart rate went back into the 140s and a sinus tachycardia.  Given Ativan  for symptomatic relief and will list this as an allergy in her chart.  Labs show potassium of 3.0, magnesium  of 1.2.  Will give replacement here.  Given electrolyte derangements, clinical signs of dehydration, intractable symptoms, will discuss with hospitalist for admission.  Of note patient is also pancytopenic with a white count of 1.8.  Initial oral temperature is 99.3.  Will recheck.  2:30 AM  Pt's repeat oral temperature is 99.4.  She denies any fevers at home or other infectious symptoms.  She reports she finished chemo and radiation 2 weeks ago.  She declines rectal temperature here.  Absolute neutrophil count 1.4.  Will continue to closely monitor for signs of neutropenic fever.  Will hold antibiotics currently but send cultures and continue to monitor closely with low threshold to start if patient becomes more neutropenic or develops fever.  CONSULTS:  Consulted and discussed patient's case with hospitalist, Dr. Lawence.  I have recommended admission and consulting physician agrees and will place admission orders.  Patient (and family if present) agree with this plan.   I reviewed all nursing notes, vitals, pertinent previous records.  All labs, EKGs, imaging ordered have been independently reviewed and interpreted by myself.    OUTSIDE RECORDS REVIEWED: Reviewed last admission note.       FINAL CLINICAL IMPRESSION(S) / ED DIAGNOSES   Final diagnoses:  Gastrostomy tube dysfunction (HCC)  Other chronic pain  Nausea and vomiting, unspecified vomiting type  Hypokalemia   Hyponatremia  Pancytopenia (HCC)     Rx / DC Orders   ED Discharge Orders     None        Note:  This document was prepared using Dragon voice recognition software and may include unintentional dictation errors.   Latiana Tomei, Josette SAILOR, DO 08/16/24 [phone]

## 2024-08-16 NOTE — Progress Notes (Signed)
 Patient with a history of head and neck cancer, s/p chemo and radiation a few weeks ago, admitted with nausea and vomiting and electrolyte abnormalities. Electrolytes are being repleted, No TOC needs at this time.

## 2024-08-16 NOTE — Assessment & Plan Note (Signed)
-   The patient will be placed on PPI therapy as mentioned above.

## 2024-08-16 NOTE — Assessment & Plan Note (Signed)
-   Will continue antihypertensive therapy.

## 2024-08-16 NOTE — Assessment & Plan Note (Signed)
-   This is secondary to her recent chemotherapy. - Oncology consult to be obtained. - I notified Dr. Babara about the patient.

## 2024-08-16 NOTE — Plan of Care (Signed)

## 2024-08-16 NOTE — ED Notes (Signed)
 Pt tube flushed without difficultly

## 2024-08-16 NOTE — Assessment & Plan Note (Signed)
-   Will replace magnesium .

## 2024-08-17 ENCOUNTER — Inpatient Hospital Stay

## 2024-08-17 ENCOUNTER — Inpatient Hospital Stay: Admitting: Oncology

## 2024-08-17 ENCOUNTER — Other Ambulatory Visit: Payer: Self-pay

## 2024-08-17 DIAGNOSIS — R112 Nausea with vomiting, unspecified: Secondary | ICD-10-CM | POA: Diagnosis not present

## 2024-08-17 LAB — CBC WITH DIFFERENTIAL/PLATELET
Abs Immature Granulocytes: 0.01 K/uL (ref 0.00–0.07)
Basophils Absolute: 0 K/uL (ref 0.0–0.1)
Basophils Relative: 0 %
Eosinophils Absolute: 0 K/uL (ref 0.0–0.5)
Eosinophils Relative: 2 %
HCT: 23.4 % — ABNORMAL LOW (ref 36.0–46.0)
Hemoglobin: 7.9 g/dL — ABNORMAL LOW (ref 12.0–15.0)
Immature Granulocytes: 1 %
Lymphocytes Relative: 15 %
Lymphs Abs: 0.2 K/uL — ABNORMAL LOW (ref 0.7–4.0)
MCH: 32.6 pg (ref 26.0–34.0)
MCHC: 33.8 g/dL (ref 30.0–36.0)
MCV: 96.7 fL (ref 80.0–100.0)
Monocytes Absolute: 0.2 K/uL (ref 0.1–1.0)
Monocytes Relative: 16 %
Neutro Abs: 0.7 K/uL — ABNORMAL LOW (ref 1.7–7.7)
Neutrophils Relative %: 66 %
Platelets: 150 K/uL (ref 150–400)
RBC: 2.42 MIL/uL — ABNORMAL LOW (ref 3.87–5.11)
RDW: 19.9 % — ABNORMAL HIGH (ref 11.5–15.5)
Smear Review: NORMAL
WBC: 1.1 K/uL — CL (ref 4.0–10.5)
nRBC: 0 % (ref 0.0–0.2)

## 2024-08-17 LAB — MAGNESIUM: Magnesium: 1.5 mg/dL — ABNORMAL LOW (ref 1.7–2.4)

## 2024-08-17 LAB — BASIC METABOLIC PANEL WITH GFR
Anion gap: 14 (ref 5–15)
BUN: 7 mg/dL (ref 6–20)
CO2: 22 mmol/L (ref 22–32)
Calcium: 8.6 mg/dL — ABNORMAL LOW (ref 8.9–10.3)
Chloride: 100 mmol/L (ref 98–111)
Creatinine, Ser: 0.66 mg/dL (ref 0.44–1.00)
GFR, Estimated: >60 mL/min (ref >60)
Glucose, Bld: 93 mg/dL (ref 70–99)
Potassium: 3.6 mmol/L (ref 3.5–5.1)
Sodium: 136 mmol/L (ref 135–145)

## 2024-08-17 LAB — ECHOCARDIOGRAM COMPLETE
Area-P 1/2: 5.72 cm2
Height: 62 in
S' Lateral: 3 cm
Weight: 3121.71 [oz_av]

## 2024-08-17 LAB — PATHOLOGIST SMEAR REVIEW

## 2024-08-17 MED ORDER — MAGNESIUM SULFATE 2 GM/50ML IV SOLN
2.0000 g | Freq: Once | INTRAVENOUS | Status: AC
  Administered 2024-08-17: 10:00:00 2 g via INTRAVENOUS
  Filled 2024-08-17: qty 50

## 2024-08-17 MED ORDER — POTASSIUM CHLORIDE 20 MEQ PO PACK
40.0000 meq | PACK | Freq: Once | ORAL | Status: AC
  Administered 2024-08-17: 10:00:00 40 meq
  Filled 2024-08-17: qty 2

## 2024-08-17 MED ORDER — PANTOPRAZOLE SODIUM 40 MG IV SOLR
40.0000 mg | INTRAVENOUS | Status: DC
  Administered 2024-08-18: 40 mg via INTRAVENOUS
  Filled 2024-08-17: qty 10

## 2024-08-17 MED FILL — Nutritional Supplement Liquid: ORAL | Qty: 237 | Status: AC

## 2024-08-17 NOTE — Progress Notes (Addendum)
 [facility] CLINIC CARDIOLOGY PROGRESS NOTE       Patient ID: Brandi [name] MRN: [id number] DOB/AGE: December 21, 1995 28 y.o.  Admit date: 08/16/2024 Referring Physician Dr. Lanetta Primary Physician Osei-Bonsu, Zachary, MD  Primary Cardiologist None Reason for Consultation ?atrial fibrillation  HPI: Brandi [name] is a 28 y.o. female  with a past medical history of asthma, GERD, essential hypertension, cannabinoid hyperemesis syndrome and head and neck cancer status postchemotherapy and radiotherapy who presented to the ED on 08/16/2024 for nausea/vomiting. EKG overnight in the ED with concern for afib. Cardiology was consulted for further evaluation.   Interval history: -Patient seen and examined this AM, resting comfortably in hospital bed.  -Feeling better overall today. Denies CP, SOB, palpitations.  -One episode of tachycardia earlier this AM on tele, now HR controlled.  -K improved at 3.6 this AM, mag low at 1.5.  Review of systems complete and found to be negative unless listed above    Past Medical History:  Diagnosis Date   Asthma    Cancer (HCC)    head and neck, getting radiation   GERD (gastroesophageal reflux disease)    HTN (hypertension)     Past Surgical History:  Procedure Laterality Date   dental procedure     IR GASTROSTOMY TUBE MOD SED  07/05/2024   IR US  LIVER BIOPSY  05/17/2024    Medications Prior to Admission  Medication Sig Dispense Refill Last Dose/Taking   ALPRAZolam  (XANAX ) 0.25 MG tablet Take 1 tablet (0.25 mg total) by mouth at bedtime as needed for anxiety. 30 tablet 0 Taking As Needed   amLODipine  (NORVASC ) 5 MG tablet Place 1 tablet (5 mg total) into feeding tube daily. 30 tablet 0 08/15/2024   LORazepam  (ATIVAN ) 0.5 MG tablet Take 0.5 mg by mouth every 8 (eight) hours as needed for anxiety.   Taking As Needed   omeprazole  (PRILOSEC) 20 MG capsule Take 1 capsule (20 mg total) by mouth daily. Open capsule pour into water  administer through tube flush with  water  after administration 30 capsule 1 08/15/2024   ondansetron  (ZOFRAN ) 4 MG tablet Place 2 tablets (8 mg total) into feeding tube every 8 (eight) hours as needed for nausea or vomiting. 30 tablet 0 Taking As Needed   oxycodone  (OXY-IR) 5 MG capsule Take 5 mg by mouth every 4 (four) hours as needed.   Taking As Needed   pantoprazole  (PROTONIX ) 40 MG tablet Take 40 mg by mouth daily.   08/15/2024   potassium chloride  20 MEQ/15ML (10%) SOLN Take 15 mLs (20 mEq total) by mouth daily. 473 mL 2 08/15/2024   prochlorperazine  (COMPAZINE ) 10 MG tablet Take 1 tablet (10 mg total) by mouth every 8 (eight) hours as needed for nausea or vomiting. 30 tablet 0 08/15/2024   SLYND  4 MG TABS Take 1 tablet by mouth daily.   Taking   cyclobenzaprine  (FLEXERIL ) 10 MG tablet Take 1 tablet (10 mg total) by mouth 3 (three) times daily as needed for muscle spasms. (Patient not taking: No sig reported) 90 tablet 0 Not Taking   lactulose  (CHRONULAC ) 10 GM/15ML solution Take 45 mLs (30 g total) by mouth daily as needed for severe constipation. (Patient not taking: Reported on 08/16/2024) 473 mL 0 Not Taking   metoCLOPramide  (REGLAN ) 5 MG/5ML solution Take 10 mLs (10 mg total) by mouth every 6 (six) hours as needed for nausea or vomiting. (Patient not taking: Reported on 08/16/2024) 120 mL 1 Not Taking   Nutritional Supplements (ENSURE CLEAR) LIQD Take 1 Bottle  by mouth 2 (two) times daily. 296 mL 6    Nutritional Supplements (FEEDING SUPPLEMENT, OSMOLITE 1.5 CAL,) LIQD Place 119 mLs into feeding tube 6 (six) times daily.      phenol (CHLORASEPTIC) 1.4 % LIQD Use as directed 1 spray in the mouth or throat as needed for throat irritation / pain. (Patient not taking: No sig reported)   Not Taking   Water  For Irrigation, Sterile (FREE WATER ) SOLN Place 60 mLs into feeding tube 6 (six) times daily. (Patient not taking: Reported on 08/15/2024)      Social History   Socioeconomic History   Marital status: Significant Other     Spouse name: Not on file   Number of children: Not on file   Years of education: Not on file   Highest education level: Not on file  Occupational History   Not on file  Tobacco Use   Smoking status: Former    Types: E-cigarettes   Smokeless tobacco: Never  Vaping Use   Vaping status: Former  Substance and Sexual Activity   Alcohol use: Not Currently    Comment: social   Drug use: Yes    Types: Marijuana   Sexual activity: Yes    Partners: Female    Birth control/protection: None  Other Topics Concern   Not on file  Social History Narrative   Not on file   Social Drivers of Health   Tobacco Use: Medium Risk (08/16/2024)   Patient History    Smoking Tobacco Use: Former    Smokeless Tobacco Use: Never    Passive Exposure: Not on Actuary Strain: Not on file  Food Insecurity: No Food Insecurity (08/16/2024)   Epic    Worried About Programme Researcher, Broadcasting/film/video in the Last Year: Never true    Ran Out of Food in the Last Year: Never true  Recent Concern: Food Insecurity - Food Insecurity Present (06/29/2024)   Epic    Worried About Programme Researcher, Broadcasting/film/video in the Last Year: Sometimes true    Ran Out of Food in the Last Year: Sometimes true  Transportation Needs: No Transportation Needs (08/16/2024)   Epic    Lack of Transportation (Medical): No    Lack of Transportation (Non-Medical): No  Recent Concern: Transportation Needs - Unmet Transportation Needs (07/31/2024)   Epic    Lack of Transportation (Medical): Yes    Lack of Transportation (Non-Medical): No  Physical Activity: Not on file  Stress: Not on file  Social Connections: Patient Declined (07/28/2024)   Social Connection and Isolation Panel    Frequency of Communication with Friends and Family: Patient declined    Frequency of Social Gatherings with Friends and Family: Patient declined    Attends Religious Services: Patient declined    Active Member of Clubs or Organizations: Patient declined    Attends Occupational Hygienist Meetings: Patient declined    Marital Status: Patient declined  Intimate Partner Violence: Unknown (08/16/2024)   Epic    Fear of Current or Ex-Partner: Not on file    Emotionally Abused: Not on file    Physically Abused: No    Sexually Abused: No  Depression (PHQ2-9): Low Risk (08/15/2024)   Depression (PHQ2-9)    PHQ-2 Score: 1  Recent Concern: Depression (PHQ2-9) - Medium Risk (08/11/2024)   Depression (PHQ2-9)    PHQ-2 Score: 6  Alcohol Screen: Low Risk (05/30/2024)   Alcohol Screen    Last Alcohol Screening Score (AUDIT): 0  Housing: Unknown (08/16/2024)  Epic    Unable to Pay for Housing in the Last Year: Not on file    Number of Times Moved in the Last Year: Not on file    Homeless in the Last Year: No  Utilities: Not At Risk (08/16/2024)   Epic    Threatened with loss of utilities: No  Health Literacy: Not on file    Family History  Problem Relation Age of Onset   Hypertension Maternal Grandmother    Diabetes Maternal Grandmother      Vitals:   08/16/24 1711 08/16/24 1943 08/16/24 2345 08/17/24 0456  BP: 136/82 107/61 130/76 138/82  Pulse: (!) 113 (!) 118 (!) 121 99  Resp: 17 17 16 16   Temp: 99.7 F (37.6 C) 99.9 F (37.7 C) 97.8 F (36.6 C) 97.8 F (36.6 C)  TempSrc: Oral Oral    SpO2: 100% 100% 100% 100%  Weight:      Height:        PHYSICAL EXAM General: Young female, well nourished, in no acute distress. HEENT: Normocephalic and atraumatic. Neck: No JVD.  Lungs: Normal respiratory effort on room air. Clear bilaterally to auscultation. No wheezes, crackles, rhonchi.  Heart: HRR, elevated rate. Normal S1 and S2 without gallops or murmurs.  Abdomen: Non-distended appearing.  Msk: Normal strength and tone for age. Extremities: Warm and well perfused. No clubbing, cyanosis.  No edema.  Neuro: Alert and oriented X 3. Psych: Answers questions appropriately.   Labs: Basic Metabolic Panel: Recent Labs    08/16/24 1927 08/17/24 0625   NA 136 136  K 3.0* 3.6  CL 100 100  CO2 21* 22  GLUCOSE 126* 93  BUN 6 7  CREATININE 0.77 0.66  CALCIUM 8.4* 8.6*  MG 1.6* 1.5*   Liver Function Tests: Recent Labs    08/16/24 0110  AST 27  ALT 31  ALKPHOS 71  BILITOT 0.5  PROT 6.9  ALBUMIN 4.0   Recent Labs    08/16/24 0110  LIPASE 12   CBC: Recent Labs    08/16/24 0110 08/16/24 0613 08/17/24 0625  WBC 1.8* 1.7* 1.1*  NEUTROABS 1.4*  --  0.7*  HGB 9.2* 8.4* 7.9*  HCT 25.8* 23.8* 23.4*  MCV 92.1 91.9 96.7  PLT 149* 139* 150   Cardiac Enzymes: No results for input(s): CKTOTAL, CKMB, CKMBINDEX, TROPONINIHS in the last 72 hours. BNP: No results for input(s): BNP in the last 72 hours. D-Dimer: No results for input(s): DDIMER in the last 72 hours. Hemoglobin A1C: No results for input(s): HGBA1C in the last 72 hours. Fasting Lipid Panel: No results for input(s): CHOL, HDL, LDLCALC, TRIG, CHOLHDL, LDLDIRECT in the last 72 hours. Thyroid  Function Tests: No results for input(s): TSH, T4TOTAL, T3FREE, THYROIDAB in the last 72 hours.  Invalid input(s): FREET3 Anemia Panel: No results for input(s): VITAMINB12, FOLATE, FERRITIN, TIBC, IRON, RETICCTPCT in the last 72 hours.   Radiology: DG Shoulder Left Result Date: 08/05/2024 EXAM: 1 VIEW(S) XRAY OF THE LEFT SHOULDER 08/05/2024 01:58:00 AM COMPARISON: None available. CLINICAL HISTORY: fall FINDINGS: BONES AND JOINTS: Glenohumeral joint is normally aligned. No acute fracture. No malalignment. The [facility] joint is unremarkable. SOFT TISSUES: No abnormal calcifications. Visualized lung is unremarkable. IMPRESSION: 1. No acute fracture or dislocation. Electronically signed by: Morgane Naveau MD 08/05/2024 02:05 AM EST RP Workstation: [id number]   DG Chest 2 View Result Date: 08/05/2024 EXAM: 2 VIEW(S) XRAY OF THE CHEST 08/05/2024 01:58:00 AM COMPARISON: 07/24/2024 CLINICAL HISTORY: cp FINDINGS: LUNGS AND PLEURA: No focal  pulmonary  opacity. No pleural effusion. No pneumothorax. HEART AND MEDIASTINUM: No acute abnormality of the cardiac and mediastinal silhouettes. BONES AND SOFT TISSUES: No acute osseous abnormality. IMPRESSION: 1. No acute cardiopulmonary process. Electronically signed by: Morgane Naveau MD 08/05/2024 02:05 AM EST RP Workstation: [id number]   DG Chest Port 1 View Result Date: 07/24/2024 EXAM: 1 VIEW(S) XRAY OF THE CHEST 07/24/2024 01:58:08 AM COMPARISON: 07/22/2024 CLINICAL HISTORY: SOB (shortness of breath) FINDINGS: LUNGS AND PLEURA: No focal pulmonary opacity. No pleural effusion. No pneumothorax. HEART AND MEDIASTINUM: No acute abnormality of the cardiac and mediastinal silhouettes. BONES AND SOFT TISSUES: No acute osseous abnormality. IMPRESSION: 1. No acute cardiopulmonary process to explain shortness of breath. Electronically signed by: Oneil Devonshire MD 07/24/2024 02:15 AM EST RP Workstation: [id number]   DG ABDOMEN PEG TUBE LOCATION Result Date: 07/24/2024 EXAM: 1 VIEW XRAY OF THE ABDOMEN 07/24/2024 01:58:08 AM COMPARISON: None available. CLINICAL HISTORY: S/P percutaneous endoscopic gastrostomy (PEG) tube placement (HCC) FINDINGS: BOWEL: Nonobstructive bowel gas pattern. Contrast material opacifies the stomach and duodenal C-loop. SOFT TISSUES: Gastrostomy tube is in place overlying the mid body of the stomach. No opaque urinary calculi. BONES: No acute osseous abnormality. IMPRESSION: 1. Gastrostomy tube appropriately positioned within the stomach with contrast opacifying the stomach and duodenal C-loop. Electronically signed by: Oneil Devonshire MD 07/24/2024 02:13 AM EST RP Workstation: [id number]   DG Chest 2 View Result Date: 07/22/2024 EXAM: 2 VIEW(S) XRAY OF THE CHEST 07/22/2024 08:34:16 PM COMPARISON: 07/03/2024 CLINICAL HISTORY: productive cough, eval infiltrate FINDINGS: LUNGS AND PLEURA: No focal pulmonary opacity. No pleural effusion. No pneumothorax. HEART AND MEDIASTINUM: No acute  abnormality of the cardiac and mediastinal silhouettes. BONES AND SOFT TISSUES: No acute osseous abnormality. IMPRESSION: 1. No acute cardiopulmonary process. Electronically signed by: Elsie Gravely MD 07/22/2024 08:39 PM EST RP Workstation: [id number]    ECHO pending  TELEMETRY (personally reviewed): Sinus rhythm 90s  EKG (personally reviewed): sinus tach rate 114 bpm, repeat shows aflutter vs more likely sinus tach rate 140 bpm  Data reviewed by me 08/17/2024: last 24h vitals tele labs imaging I/O ED provider note, admission H&P, hospitalist progress note, oncology notes  Principal Problem:   Intractable nausea and vomiting Active Problems:   Hypokalemia   Hyponatremia   Hypomagnesemia   Essential hypertension   Anxiety   GERD without esophagitis   Pancytopenia (HCC)    ASSESSMENT AND PLAN:  Brandi [name] is a 28 y.o. female  with a past medical history of asthma, GERD, essential hypertension, cannabinoid hyperemesis syndrome and head and neck cancer s/p chemotherapy and radiation who presented to the ED on 08/16/2024 for nausea/vomiting. EKG overnight in the ED with concern for afib. Cardiology was consulted for further evaluation.   # Sinus tachycardia # Electrolyte derangements # Stage III EBV positive nasopharyngeal carcinoma, s/p chemo & radiation # Hypertension Patient presented due to nausea/vomiting, feeding tube issue. Noted to be tachycardic in the ED, EKGs sinus tach rate 114 bpm, repeat shows aflutter vs more likely sinus tach rate 140 bpm. K 3.0 on admission down to 2.7 on AM labs. Magnesium  1.2 on admission. Severe leukopenia with WBC 1.8.  -Echo pending, further recommendations pending these results.  -Low suspicion for AF/AFL given age, lack of risk factors. Suspect sinus tachycardia 2/2 underlying electrolyte derangements.  -Monitor and replenish electrolytes for a goal K >4, Mag >2    -Would recommend ruling out PE given her hx of cancer and now tachycardia.     This patient's plan of care was discussed and  created with Dr. Ammon and he is in agreement.  Signed: Danita Bloch, PA-C  08/17/2024, 8:19 AM [facility] Cardiology

## 2024-08-17 NOTE — Progress Notes (Signed)
 Progress Note   Patient: Brandi [name] FMW:[id number] DOB: 12-Dec-1995 DOA: 08/16/2024     1 DOS: the patient was seen and examined on 08/17/2024   Brief hospital course: Brandi [name] is a 28 y.o. 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  She admitted to dark vomitus and not typically coffee-ground.  She thought her feeding tube was clogged.  Her family tried to flush it slowly at home.  She has throat pain that is chronic.  No fever or chills.  No bilious vomitus or hematemesis.  No diarrhea or melena or bright red in per rectum.  No dysuria, oliguria or hematuria or flank pain.   12/17 -had significant electrolyte abnormalities on admission which include hypokalemia and hypomagnesemia.  Cardiology consulted for concerns of A-fib  12/18 -no further episodes of nausea.  Improved potassium and magnesium  levels.  Leukopenia persists.     Assessment and Plan:  Intractable nausea and vomiting Most likely related to recent chemotherapy Continue supportive care with antiemetics and PPI Improved    Hypokalemia/hypomagnesemia Secondary to GI losses from nausea and vomiting Supplement potassium and magnesium     Hypovolemic hyponatremia Secondary to GI losses from nausea and vomiting Improved with IV fluid hydration    Pancytopenia (HCC) Secondary to recent chemotherapy Appreciate oncology input Does not require any blood transfusion at this time    GERD without esophagitis Continue IV PPI    Anxiety Continue as needed lorazepam     Essential hypertension Continue amlodipine            Subjective: No further episodes of nausea or vomiting.  Physical Exam: Vitals:   08/16/24 1943 08/16/24 2345 08/17/24 0456  08/17/24 0841  BP: 107/61 130/76 138/82 124/80  Pulse: (!) 118 (!) 121 99 92  Resp: 17 16 16 16   Temp: 99.9 F (37.7 C) 97.8 F (36.6 C) 97.8 F (36.6 C) 98.2 F (36.8 C)  TempSrc: Oral     SpO2: 100% 100% 100% 100%  Weight:      Height:       GENERAL:  28 y.o.-year-old African-American patient lying in the bed with no acute distress.  EYES: Pupils equal, round, reactive to light and accommodation. No scleral icterus. Extraocular muscles intact.  HEENT: Head atraumatic, normocephalic. Oropharynx and nasopharynx clear.  NECK:  Supple, no jugular venous distention. No thyroid  enlargement, no tenderness.  LUNGS: Normal breath sounds bilaterally, no wheezing, rales,rhonchi or crepitation. No use of accessory muscles of respiration.  CARDIOVASCULAR: Regular rate and rhythm, S1, S2 normal. No murmurs, rubs, or gallops.  ABDOMEN: Soft, nondistended, nontender. Bowel sounds present. No organomegaly or mass.  EXTREMITIES: No pedal edema, cyanosis, or clubbing.  NEUROLOGIC: Cranial nerves II through XII are intact. Muscle strength 5/5 in all extremities. Sensation intact. Gait not checked.  PSYCHIATRIC: The patient is alert and oriented x 3.  Normal affect and good eye contact. SKIN: No obvious rash, lesion, or ulcer.       Data Reviewed: Labs reviewed.  White count 1.1, hemoglobin 7.9, magnesium  1.5 Labs reviewed  Family Communication: Plan of care discussed with patient in detail.  She verbalizes understanding and agrees with the plan  Disposition: Status is: Inpatient Remains inpatient appropriate because: Discharge planning  Planned Discharge Destination: Home    Time spent: 35 minutes  Author: Aimee Attallah Ontko,  MD 08/17/2024 9:58 AM  For on call review [email or web address].

## 2024-08-17 NOTE — Plan of Care (Signed)

## 2024-08-17 NOTE — Progress Notes (Signed)
 [facility] CSW Progress Note  [facility] team conducted a case conference.  Included were Diego Dawn, RD, Marjorie Parkin, RN,  Remo Stanford, RN, Hildegard Daring, CSW Intern, and CSW.  Marjorie will monitor patient's hospital stay and schedule her for a follow up appointment after her discharge.  Patient has expressed minimal support and financial challenges.  Her social security disability is still pending.  Hildegard will continue providing counseling and resources.  Joli will follow up with patient regarding her nutritional concerns.  The oncology team will provide ongoing medical support.  She has concluded her cancer treatment.   Macario CHRISTELLA Au, LCSW Clinical Social Worker [facility]    Patient is participating in a Managed Medicaid Plan:  Yes

## 2024-08-17 NOTE — Discharge Instructions (Signed)
..  Tefl Teacher by  [facility] [phone]

## 2024-08-17 NOTE — TOC Progression Note (Addendum)
 Transition of Care [facility]) - Progression Note    Patient Details  Name: Brandi [name] MRN: [id number] Date of Birth: [birth year]  [facility] CM/SW Contact  Dalia GORMAN Fuse, RN Phone Number: 08/17/2024, 10:03 AM  Clinical Narrative:    Cardiology following with recs to r/out PE. Tachycardia likely ST vs Afib/Aflutter, recs for echo. N/V resolved, but Leukopenia persists. [employer] resources for transportation added to the AVS.   Patient noted to have a peg tube. No orders for feeds at this time.   11:20 TOC received secure message from Macario HUGHS with oncology clinic. Per the LCSW the patient has financial concerns and has applied for Disability, but has difficulty affording liquid tube feeds. The patient receives Osmolite 1.5 119 ml 6 x daily. TOC reached out to [employer] to learn more about the patient assistance program. TOC awaiting additional info for [employer].   TOC spoke with the patient in her room. She is unable to afford the liquid medications to take via tube. The patient crushes the po medications and has been able to tolerate them. The patient verbalized that she has applied for SSI, and is awaiting f/u. The patient verbalized that she has not been able to tolerate Osmolite 1.5 ( 6 x daily) because of the way it settles on her stomach. She drinks ensure 4 times a day, but has to pay $10 for 4 cans. She also states the ensure is not always available.   Manon Dawn, Oncology RD, confirmed the patient is set up with [employer] to provide her tube feeding formula and supplies (osmolite 1.5). Her [place] Medicaid is covering 100% of her formula and supplies for tube feeding (syringes). She has also provided coupons for ensure.   No TOC needs at this time.                         Expected Discharge Plan and Services                                               Social Drivers of Health (SDOH) Interventions SDOH Screenings   Food Insecurity: No Food  Insecurity (08/16/2024)  Recent Concern: Food Insecurity - Food Insecurity Present (06/29/2024)  Housing: Unknown (08/16/2024)  Transportation Needs: No Transportation Needs (08/16/2024)  Recent Concern: Transportation Needs - Unmet Transportation Needs (07/31/2024)  Utilities: Not At Risk (08/16/2024)  Alcohol Screen: Low Risk (05/30/2024)  Depression (PHQ2-9): Low Risk (08/15/2024)  Recent Concern: Depression (PHQ2-9) - Medium Risk (08/11/2024)  Social Connections: Patient Declined (07/28/2024)  Tobacco Use: Medium Risk (08/16/2024)    Readmission Risk Interventions     No data to display

## 2024-08-18 DIAGNOSIS — R112 Nausea with vomiting, unspecified: Secondary | ICD-10-CM | POA: Diagnosis not present

## 2024-08-18 LAB — BASIC METABOLIC PANEL WITH GFR
Anion gap: 13 (ref 5–15)
BUN: <5 mg/dL — ABNORMAL LOW (ref 6–20)
CO2: 22 mmol/L (ref 22–32)
Calcium: 8.7 mg/dL — ABNORMAL LOW (ref 8.9–10.3)
Chloride: 100 mmol/L (ref 98–111)
Creatinine, Ser: 0.59 mg/dL (ref 0.44–1.00)
GFR, Estimated: >60 mL/min
Glucose, Bld: 132 mg/dL — ABNORMAL HIGH (ref 70–99)
Potassium: 3.3 mmol/L — ABNORMAL LOW (ref 3.5–5.1)
Sodium: 135 mmol/L (ref 135–145)

## 2024-08-18 LAB — CBC
HCT: 22.5 % — ABNORMAL LOW (ref 36.0–46.0)
Hemoglobin: 7.4 g/dL — ABNORMAL LOW (ref 12.0–15.0)
MCH: 32.3 pg (ref 26.0–34.0)
MCHC: 32.9 g/dL (ref 30.0–36.0)
MCV: 98.3 fL (ref 80.0–100.0)
Platelets: 179 K/uL (ref 150–400)
RBC: 2.29 MIL/uL — ABNORMAL LOW (ref 3.87–5.11)
RDW: 20.3 % — ABNORMAL HIGH (ref 11.5–15.5)
WBC: 1.1 K/uL — CL (ref 4.0–10.5)
nRBC: 2.7 % — ABNORMAL HIGH (ref 0.0–0.2)

## 2024-08-18 LAB — MAGNESIUM: Magnesium: 1.6 mg/dL — ABNORMAL LOW (ref 1.7–2.4)

## 2024-08-18 LAB — GLUCOSE, CAPILLARY: Glucose-Capillary: 109 mg/dL — ABNORMAL HIGH (ref 70–99)

## 2024-08-18 MED ORDER — MAGNESIUM SULFATE 2 GM/50ML IV SOLN: 2.0000 g | Freq: Once | INTRAVENOUS | Status: DC

## 2024-08-18 MED ORDER — POTASSIUM CHLORIDE CRYS ER 20 MEQ PO TBCR
40.0000 meq | EXTENDED_RELEASE_TABLET | ORAL | Status: AC
  Administered 2024-08-18 (×2): 40 meq via ORAL
  Filled 2024-08-18 (×2): qty 2

## 2024-08-18 MED ORDER — METOCLOPRAMIDE HCL 5 MG/ML IJ SOLN
10.0000 mg | Freq: Once | INTRAMUSCULAR | Status: AC
  Administered 2024-08-18: 10 mg via INTRAVENOUS
  Filled 2024-08-18: qty 2

## 2024-08-18 MED ORDER — MAGNESIUM SULFATE 2 GM/50ML IV SOLN
2.0000 g | Freq: Once | INTRAVENOUS | Status: AC
  Administered 2024-08-18: 2 g via INTRAVENOUS
  Filled 2024-08-18: qty 50

## 2024-08-18 MED ORDER — METOPROLOL SUCCINATE ER 25 MG PO TB24
25.0000 mg | ORAL_TABLET | Freq: Every day | ORAL | Status: DC
  Administered 2024-08-18 – 2024-08-19 (×2): 25 mg via ORAL
  Filled 2024-08-18 (×2): qty 1

## 2024-08-18 MED ORDER — PANTOPRAZOLE SODIUM 40 MG PO TBEC
40.0000 mg | DELAYED_RELEASE_TABLET | Freq: Every day | ORAL | Status: DC
  Administered 2024-08-19: 40 mg via ORAL
  Filled 2024-08-18: qty 1

## 2024-08-18 MED FILL — Nutritional Supplement Liquid: ORAL | Qty: 237 | Status: AC

## 2024-08-18 NOTE — Progress Notes (Signed)
 PHARMACIST - PHYSICIAN COMMUNICATION CONCERNING: IV to Oral Route Change Policy  RECOMMENDATION: This patient is receiving pantoprazole  intravenously. Based on criteria approved by the Pharmacy and Therapeutics Committee, the intravenous medication(s) is/are being converted to the equivalent dose of an oral formulation.   DESCRIPTION: These criteria include: The patient is eating (either orally or via tube) and/or has been taking other orally administered medications for at least 24 hours. The patient has no evidence of active gastrointestinal bleeding or malabsorptive GI state (gastrectomy; short bowel; patient on TNA or NPO).  If you have questions about this conversion, please contact the Pharmacy Department:  [x]   [phone] )  [facility] []   [phone] )  [facility] []   [phone] )  [facility] []   [phone] )  Darryle Law   Will M. Lenon, PharmD, BCPS Clinical Pharmacist 08/18/2024 4:16 PM

## 2024-08-18 NOTE — Progress Notes (Signed)
 " Progress Note   Patient: Brandi [name] FMW:[id number] DOB: [birth year] DOA: 08/16/2024     2 DOS: the patient was seen and examined on 08/18/2024   Brief hospital course:  Brandi [name] is a 28 y.o. 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  She admitted to dark vomitus and not typically coffee-ground.  She thought her feeding tube was clogged.  Her family tried to flush it slowly at home.  She has throat pain that is chronic.  No fever or chills.  No bilious vomitus or hematemesis.  No diarrhea or melena or bright red in per rectum.  No dysuria, oliguria or hematuria or flank pain.    12/17 -had significant electrolyte abnormalities on admission which include hypokalemia and hypomagnesemia.  Cardiology consulted for concerns of A-fib   12/18 -no further episodes of nausea.  Improved potassium and magnesium  levels.  Leukopenia persists.  12/19 -had an episode of emesis while I was making rounds.  Had a bowel movement.  No abdominal pain    Assessment and Plan:    Intractable nausea and vomiting Most likely related to recent chemotherapy Continue supportive care with antiemetics and PPI      Hypokalemia/hypomagnesemia Secondary to GI losses from nausea and vomiting Supplement potassium and magnesium      Hypovolemic hyponatremia Secondary to GI losses from nausea and vomiting Improved with IV fluid hydration     Pancytopenia (HCC) Secondary to recent chemotherapy Appreciate oncology input Does not require any blood transfusion at this time or any intervention     GERD without esophagitis Continue IV PPI     Anxiety Continue as needed lorazepam      Essential hypertension Continue amlodipine       Tachycardia Concerning for possible atrial fibrillation most likely related to volume depletion from nausea and vomiting Appreciate cardiology input, strips reviewed and thought to be more sinus tachycardia Recommend to monitor and supplement electrolytes Patient had a 2D echocardiogram which showed normal LVEF with no valvular pathology. Continue metoprolol      Obesity Class I BMI 35.6 Complicates overall prognosis and care Lifestyle modification and exercise has been discussed with patient in detail   Subjective: Patient is seen and examined at the bedside.  Had a witnessed episode of emesis at the bedside during rounds.  Physical Exam: Vitals:   08/17/24 2001 08/18/24 0027 08/18/24 0508 08/18/24 0739  BP: 125/75 136/78 130/79 (!) 134/95  Pulse:  (!) 109 99 (!) 104  Resp: 16 19 16 19   Temp: 98.3 F (36.8 C) 98.5 F (36.9 C) 98.9 F (37.2 C) 98.8 F (37.1 C)  TempSrc:    Oral  SpO2: 100% 100% 100% 100%  Weight:      Height:       GENERAL:  28 y.o.-year-old African-American patient lying in the bed with no acute distress.  EYES: Pupils equal, round, reactive to light and accommodation. No scleral icterus. Extraocular muscles intact.  HEENT: Head atraumatic, normocephalic. Oropharynx and nasopharynx clear.  NECK:  Supple, no jugular venous distention. No thyroid  enlargement, no tenderness.  LUNGS: Normal breath sounds bilaterally, no wheezing, rales,rhonchi or crepitation. No use of accessory muscles of respiration.  CARDIOVASCULAR: Regular rate and rhythm, S1, S2 normal. No murmurs, rubs, or gallops.  ABDOMEN: Soft, nondistended, nontender. Bowel sounds present. No organomegaly or mass.  EXTREMITIES: No pedal edema, cyanosis, or clubbing.  NEUROLOGIC: Cranial nerves II through XII are intact. Muscle strength 5/5 in all extremities. Sensation intact. Gait not checked.  PSYCHIATRIC: The patient is alert and oriented x 3.  Normal affect and good eye contact. SKIN: No  obvious rash, lesion, or ulcer.     Data Reviewed: Labs reviewed.  Potassium 3.3, magnesium  1.6, white count 11.1, hemoglobin 7.4 Labs reviewed  Family Communication: Plan of care discussed with patient in detail.  Possible discharge in a.m. if emesis is resolved  Disposition: Status is: Inpatient Remains inpatient appropriate because: Continued emesis and requiring antiemetics  Planned Discharge Destination: Home    Time spent: 40 minutes  Author: Aimee Somerset, MD 08/18/2024 10:45 AM  For on call review [email or web address]  "

## 2024-08-18 NOTE — TOC Progression Note (Signed)
 Transition of Care [facility]) - Progression Note    Patient Details  Name: Brandi [name] MRN: [id number] Date of Birth: 1996-08-24  [facility] of [facility] CM/SW Contact  Dalia GORMAN Fuse, RN Phone Number: 08/18/2024, 11:19 AM  Clinical Narrative:     Witnessed episode of vomiting today, continuing anti-emetics and PPI. Repleting electrolytes today.                    Expected Discharge Plan and Services                                               Social Drivers of Health (SDOH) Interventions SDOH Screenings   Food Insecurity: No Food Insecurity (08/16/2024)  Recent Concern: Food Insecurity - Food Insecurity Present (06/29/2024)  Housing: Unknown (08/16/2024)  Transportation Needs: No Transportation Needs (08/16/2024)  Recent Concern: Transportation Needs - Unmet Transportation Needs (07/31/2024)  Utilities: Not At Risk (08/16/2024)  Alcohol Screen: Low Risk (05/30/2024)  Depression (PHQ2-9): Low Risk (08/15/2024)  Recent Concern: Depression (PHQ2-9) - Medium Risk (08/11/2024)  Social Connections: Patient Declined (07/28/2024)  Tobacco Use: Medium Risk (08/16/2024)    Readmission Risk Interventions     No data to display

## 2024-08-18 NOTE — Plan of Care (Signed)

## 2024-08-18 NOTE — Plan of Care (Signed)
  Problem: Education: Goal: Knowledge of General Education information will improve Description: Including pain rating scale, medication(s)/side effects and non-pharmacologic comfort measures Outcome: Progressing   Problem: Clinical Measurements: Goal: Will remain free from infection Outcome: Progressing Goal: Cardiovascular complication will be avoided Outcome: Progressing   Problem: Nutrition: Goal: Adequate nutrition will be maintained Outcome: Progressing   

## 2024-08-18 NOTE — Progress Notes (Cosign Needed)
 " [facility] CLINIC CARDIOLOGY PROGRESS NOTE       Patient ID: Tempest Frankland MRN: [id number] DOB/AGE: Mar 24, 1996 28 y.o.  Admit date: 08/16/2024 Referring Physician Dr. Lanetta Primary Physician Osei-Bonsu, Zachary, MD  Primary Cardiologist None Reason for Consultation ?atrial fibrillation  HPI: Prisma Decarlo is a 28 y.o. female  with a past medical history of asthma, GERD, essential hypertension, cannabinoid hyperemesis syndrome and head and neck cancer status postchemotherapy and radiotherapy who presented to the ED on 08/16/2024 for nausea/vomiting. EKG overnight in the ED with concern for afib. Cardiology was consulted for further evaluation.   Interval history: -Patient seen and examined this AM, upright in hospital bed. - States that she is feeling well overall today, somewhat better than yesterday.  Continues to deny any episodes of chest pain, shortness of breath, palpitations. - Heart rate control on telemetry with no recurrent episodes of tachycardia. - Potassium slightly lower today at 3.3, magnesium  stable at 1.6.  Review of systems complete and found to be negative unless listed above    Past Medical History:  Diagnosis Date   Asthma    Cancer (HCC)    head and neck, getting radiation   GERD (gastroesophageal reflux disease)    HTN (hypertension)     Past Surgical History:  Procedure Laterality Date   dental procedure     IR GASTROSTOMY TUBE MOD SED  07/05/2024   IR US  LIVER BIOPSY  05/17/2024    Medications Prior to Admission  Medication Sig Dispense Refill Last Dose/Taking   ALPRAZolam  (XANAX ) 0.25 MG tablet Take 1 tablet (0.25 mg total) by mouth at bedtime as needed for anxiety. 30 tablet 0 Taking As Needed   amLODipine  (NORVASC ) 5 MG tablet Place 1 tablet (5 mg total) into feeding tube daily. 30 tablet 0 08/15/2024   LORazepam  (ATIVAN ) 0.5 MG tablet Take 0.5 mg by mouth every 8 (eight) hours as needed for anxiety.   Taking As Needed   omeprazole  (PRILOSEC) 20 MG  capsule Take 1 capsule (20 mg total) by mouth daily. Open capsule pour into water  administer through tube flush with water  after administration 30 capsule 1 08/15/2024   ondansetron  (ZOFRAN ) 4 MG tablet Place 2 tablets (8 mg total) into feeding tube every 8 (eight) hours as needed for nausea or vomiting. 30 tablet 0 Taking As Needed   oxycodone  (OXY-IR) 5 MG capsule Take 5 mg by mouth every 4 (four) hours as needed.   Taking As Needed   pantoprazole  (PROTONIX ) 40 MG tablet Take 40 mg by mouth daily.   08/15/2024   potassium chloride  20 MEQ/15ML (10%) SOLN Take 15 mLs (20 mEq total) by mouth daily. 473 mL 2 08/15/2024   prochlorperazine  (COMPAZINE ) 10 MG tablet Take 1 tablet (10 mg total) by mouth every 8 (eight) hours as needed for nausea or vomiting. 30 tablet 0 08/15/2024   SLYND  4 MG TABS Take 1 tablet by mouth daily.   Taking   cyclobenzaprine  (FLEXERIL ) 10 MG tablet Take 1 tablet (10 mg total) by mouth 3 (three) times daily as needed for muscle spasms. (Patient not taking: No sig reported) 90 tablet 0 Not Taking   lactulose  (CHRONULAC ) 10 GM/15ML solution Take 45 mLs (30 g total) by mouth daily as needed for severe constipation. (Patient not taking: Reported on 08/16/2024) 473 mL 0 Not Taking   metoCLOPramide  (REGLAN ) 5 MG/5ML solution Take 10 mLs (10 mg total) by mouth every 6 (six) hours as needed for nausea or vomiting. (Patient not taking: Reported on  08/16/2024) 120 mL 1 Not Taking   Nutritional Supplements (ENSURE CLEAR) LIQD Take 1 Bottle by mouth 2 (two) times daily. 296 mL 6    Nutritional Supplements (FEEDING SUPPLEMENT, OSMOLITE 1.5 CAL,) LIQD Place 119 mLs into feeding tube 6 (six) times daily.      phenol (CHLORASEPTIC) 1.4 % LIQD Use as directed 1 spray in the mouth or throat as needed for throat irritation / pain. (Patient not taking: No sig reported)   Not Taking   Water  For Irrigation, Sterile (FREE WATER ) SOLN Place 60 mLs into feeding tube 6 (six) times daily. (Patient not  taking: Reported on 08/15/2024)      Social History   Socioeconomic History   Marital status: Significant Other    Spouse name: Not on file   Number of children: Not on file   Years of education: Not on file   Highest education level: Not on file  Occupational History   Not on file  Tobacco Use   Smoking status: Former    Types: E-cigarettes   Smokeless tobacco: Never  Vaping Use   Vaping status: Former  Substance and Sexual Activity   Alcohol use: Not Currently    Comment: social   Drug use: Yes    Types: Marijuana   Sexual activity: Yes    Partners: Female    Birth control/protection: None  Other Topics Concern   Not on file  Social History Narrative   Not on file   Social Drivers of Health   Tobacco Use: Medium Risk (08/16/2024)   Patient History    Smoking Tobacco Use: Former    Smokeless Tobacco Use: Never    Passive Exposure: Not on Actuary Strain: Not on file  Food Insecurity: No Food Insecurity (08/16/2024)   Epic    Worried About Programme Researcher, Broadcasting/film/video in the Last Year: Never true    Ran Out of Food in the Last Year: Never true  Recent Concern: Food Insecurity - Food Insecurity Present (06/29/2024)   Epic    Worried About Programme Researcher, Broadcasting/film/video in the Last Year: Sometimes true    Ran Out of Food in the Last Year: Sometimes true  Transportation Needs: No Transportation Needs (08/16/2024)   Epic    Lack of Transportation (Medical): No    Lack of Transportation (Non-Medical): No  Recent Concern: Transportation Needs - Unmet Transportation Needs (07/31/2024)   Epic    Lack of Transportation (Medical): Yes    Lack of Transportation (Non-Medical): No  Physical Activity: Not on file  Stress: Not on file  Social Connections: Patient Declined (07/28/2024)   Social Connection and Isolation Panel    Frequency of Communication with Friends and Family: Patient declined    Frequency of Social Gatherings with Friends and Family: Patient declined     Attends Religious Services: Patient declined    Active Member of Clubs or Organizations: Patient declined    Attends Banker Meetings: Patient declined    Marital Status: Patient declined  Intimate Partner Violence: Unknown (08/16/2024)   Epic    Fear of Current or Ex-Partner: Not on file    Emotionally Abused: Not on file    Physically Abused: No    Sexually Abused: No  Depression (PHQ2-9): Low Risk (08/15/2024)   Depression (PHQ2-9)    PHQ-2 Score: 1  Recent Concern: Depression (PHQ2-9) - Medium Risk (08/11/2024)   Depression (PHQ2-9)    PHQ-2 Score: 6  Alcohol Screen: Low Risk (05/30/2024)  Alcohol Screen    Last Alcohol Screening Score (AUDIT): 0  Housing: Unknown (08/16/2024)   Epic    Unable to Pay for Housing in the Last Year: Not on file    Number of Times Moved in the Last Year: Not on file    Homeless in the Last Year: No  Utilities: Not At Risk (08/16/2024)   Epic    Threatened with loss of utilities: No  Health Literacy: Not on file    Family History  Problem Relation Age of Onset   Hypertension Maternal Grandmother    Diabetes Maternal Grandmother      Vitals:   08/17/24 2001 08/18/24 0027 08/18/24 0508 08/18/24 0739  BP: 125/75 136/78 130/79 (!) 134/95  Pulse:  (!) 109 99 (!) 104  Resp: 16 19 16 19   Temp: 98.3 F (36.8 C) 98.5 F (36.9 C) 98.9 F (37.2 C) 98.8 F (37.1 C)  TempSrc:    Oral  SpO2: 100% 100% 100% 100%  Weight:      Height:        PHYSICAL EXAM General: Young female, well nourished, in no acute distress. HEENT: Normocephalic and atraumatic. Neck: No JVD.  Lungs: Normal respiratory effort on room air. Clear bilaterally to auscultation. No wheezes, crackles, rhonchi.  Heart: HRR, controlled rate. Normal S1 and S2 without gallops or murmurs.  Abdomen: Non-distended appearing.  Msk: Normal strength and tone for age. Extremities: Warm and well perfused. No clubbing, cyanosis.  No edema.  Neuro: Alert and oriented X  3. Psych: Answers questions appropriately.   Labs: Basic Metabolic Panel: Recent Labs    08/17/24 0625 08/18/24 0418  NA 136 135  K 3.6 3.3*  CL 100 100  CO2 22 22  GLUCOSE 93 132*  BUN 7 <5*  CREATININE 0.66 0.59  CALCIUM 8.6* 8.7*  MG 1.5* 1.6*   Liver Function Tests: Recent Labs    08/16/24 0110  AST 27  ALT 31  ALKPHOS 71  BILITOT 0.5  PROT 6.9  ALBUMIN 4.0   Recent Labs    08/16/24 0110  LIPASE 12   CBC: Recent Labs    08/16/24 0110 08/16/24 0613 08/17/24 0625 08/18/24 0418  WBC 1.8*   < > 1.1* 1.1*  NEUTROABS 1.4*  --  0.7*  --   HGB 9.2*   < > 7.9* 7.4*  HCT 25.8*   < > 23.4* 22.5*  MCV 92.1   < > 96.7 98.3  PLT 149*   < > 150 179   < > = values in this interval not displayed.   Cardiac Enzymes: No results for input(s): CKTOTAL, CKMB, CKMBINDEX, TROPONINIHS in the last 72 hours. BNP: No results for input(s): BNP in the last 72 hours. D-Dimer: No results for input(s): DDIMER in the last 72 hours. Hemoglobin A1C: No results for input(s): HGBA1C in the last 72 hours. Fasting Lipid Panel: No results for input(s): CHOL, HDL, LDLCALC, TRIG, CHOLHDL, LDLDIRECT in the last 72 hours. Thyroid  Function Tests: No results for input(s): TSH, T4TOTAL, T3FREE, THYROIDAB in the last 72 hours.  Invalid input(s): FREET3 Anemia Panel: No results for input(s): VITAMINB12, FOLATE, FERRITIN, TIBC, IRON, RETICCTPCT in the last 72 hours.   Radiology: ECHOCARDIOGRAM COMPLETE Result Date: 08/17/2024    ECHOCARDIOGRAM REPORT   Patient Name:   [facility] Date of Exam: 08/16/2024 Medical Rec #:  [id number]    Height:       62.0 in Accession #:    [id number]   Weight:  195.1 lb Date of Birth:  [birth year]   BSA:          1.892 m Patient Age:    28 years     BP:           107/61 mmHg Patient Gender: F            HR:           112 bpm. Exam Location:  [facility] Procedure: 2D Echo, 3D Echo, Cardiac Doppler, Color Doppler and  Strain Analysis            (Both Spectral and Color Flow Doppler were utilized during            procedure). Indications:     R07.9 Chest pain                  Z09 Chemo  History:         Patient has prior history of Echocardiogram examinations, most                  recent 03/02/2019. Risk Factors:Hypertension.  Sonographer:     Carl Coma RDCS Referring Phys:  [id number] Annalina Needles Diagnosing Phys: Marsa Dooms MD IMPRESSIONS  1. Left ventricular ejection fraction, by estimation, is 55 to 60%. The left ventricle has normal function. The left ventricle has no regional wall motion abnormalities. Left ventricular diastolic parameters were normal. The average left ventricular global longitudinal strain is -21.4 %. The global longitudinal strain is normal.  2. Right ventricular systolic function is normal. The right ventricular size is normal.  3. The mitral valve is normal in structure. No evidence of mitral valve regurgitation. No evidence of mitral stenosis.  4. The aortic valve is normal in structure. Aortic valve regurgitation is not visualized. No aortic stenosis is present.  5. The inferior vena cava is normal in size with greater than 50% respiratory variability, suggesting right atrial pressure of 3 mmHg. FINDINGS  Left Ventricle: Left ventricular ejection fraction, by estimation, is 55 to 60%. The left ventricle has normal function. The left ventricle has no regional wall motion abnormalities. The average left ventricular global longitudinal strain is -21.4 %. Strain was performed and the global longitudinal strain is normal. The left ventricular internal cavity size was normal in size. There is no left ventricular hypertrophy. Left ventricular diastolic parameters were normal. Right Ventricle: The right ventricular size is normal. No increase in right ventricular wall thickness. Right ventricular systolic function is normal. Left Atrium: Left atrial size was normal in size. Right Atrium:  Right atrial size was normal in size. Pericardium: There is no evidence of pericardial effusion. Mitral Valve: The mitral valve is normal in structure. No evidence of mitral valve regurgitation. No evidence of mitral valve stenosis. Tricuspid Valve: The tricuspid valve is normal in structure. Tricuspid valve regurgitation is not demonstrated. No evidence of tricuspid stenosis. Aortic Valve: The aortic valve is normal in structure. Aortic valve regurgitation is not visualized. No aortic stenosis is present. Pulmonic Valve: The pulmonic valve was normal in structure. Pulmonic valve regurgitation is not visualized. No evidence of pulmonic stenosis. Aorta: The aortic root is normal in size and structure. Venous: The inferior vena cava is normal in size with greater than 50% respiratory variability, suggesting right atrial pressure of 3 mmHg. IAS/Shunts: No atrial level shunt detected by color flow Doppler. Additional Comments: 3D was performed not requiring image post processing on an independent workstation and was normal.  LEFT VENTRICLE PLAX 2D LVIDd:  4.30 cm   Diastology LVIDs:         3.00 cm   LV e' medial:    9.82 cm/s LV PW:         1.00 cm   LV E/e' medial:  8.6 LV IVS:        1.00 cm   LV e' lateral:   14.60 cm/s LVOT diam:     1.80 cm   LV E/e' lateral: 5.8 LV SV:         37 LV SV Index:   19        2D Longitudinal Strain LVOT Area:     2.54 cm  2D Strain GLS (A4C):   -19.8 %                          2D Strain GLS (A3C):   -23.3 %                          2D Strain GLS (A2C):   -21.0 %                          2D Strain GLS Avg:     -21.4 %                           3D Volume EF:                          3D EF:        57 %                          LV EDV:       141 ml                          LV ESV:       61 ml                          LV SV:        80 ml RIGHT VENTRICLE             IVC RV Basal diam:  3.10 cm     IVC diam: 1.60 cm RV S prime:     11.10 cm/s TAPSE (M-mode): 2.3 cm LEFT ATRIUM              Index        RIGHT ATRIUM          Index LA diam:        3.80 cm 2.01 cm/m   RA Area:     8.43 cm LA Vol (A2C):   34.1 ml 18.03 ml/m  RA Volume:   16.00 ml 8.46 ml/m LA Vol (A4C):   26.5 ml 14.01 ml/m LA Biplane Vol: 30.5 ml 16.12 ml/m  AORTIC VALVE LVOT Vmax:   105.10 cm/s LVOT Vmean:  69.500 cm/s LVOT VTI:    0.144 m  AORTA Ao Root diam: 3.00 cm MITRAL VALVE MV Area (PHT): 5.72 cm    SHUNTS MV Decel Time: 133 msec    Systemic VTI:  0.14 m MV E velocity: 84.20 cm/s  Systemic Diam: 1.80 cm MV A velocity: 83.50 cm/s MV E/A ratio:  1.01 Marsa Dooms MD Electronically signed by Marsa Dooms MD Signature Date/Time: 08/17/2024/12:53:30 PM    Final    DG Shoulder Left Result Date: 08/05/2024 EXAM: 1 VIEW(S) XRAY OF THE LEFT SHOULDER 08/05/2024 01:58:00 AM COMPARISON: None available. CLINICAL HISTORY: fall FINDINGS: BONES AND JOINTS: Glenohumeral joint is normally aligned. No acute fracture. No malalignment. The [facility] joint is unremarkable. SOFT TISSUES: No abnormal calcifications. Visualized lung is unremarkable. IMPRESSION: 1. No acute fracture or dislocation. Electronically signed by: Morgane Naveau MD 08/05/2024 02:05 AM EST RP Workstation: [id number]   DG Chest 2 View Result Date: 08/05/2024 EXAM: 2 VIEW(S) XRAY OF THE CHEST 08/05/2024 01:58:00 AM COMPARISON: 07/24/2024 CLINICAL HISTORY: cp FINDINGS: LUNGS AND PLEURA: No focal pulmonary opacity. No pleural effusion. No pneumothorax. HEART AND MEDIASTINUM: No acute abnormality of the cardiac and mediastinal silhouettes. BONES AND SOFT TISSUES: No acute osseous abnormality. IMPRESSION: 1. No acute cardiopulmonary process. Electronically signed by: Morgane Naveau MD 08/05/2024 02:05 AM EST RP Workstation: [id number]   DG Chest Port 1 View Result Date: 07/24/2024 EXAM: 1 VIEW(S) XRAY OF THE CHEST 07/24/2024 01:58:08 AM COMPARISON: 07/22/2024 CLINICAL HISTORY: SOB (shortness of breath) FINDINGS: LUNGS AND PLEURA: No focal pulmonary opacity.  No pleural effusion. No pneumothorax. HEART AND MEDIASTINUM: No acute abnormality of the cardiac and mediastinal silhouettes. BONES AND SOFT TISSUES: No acute osseous abnormality. IMPRESSION: 1. No acute cardiopulmonary process to explain shortness of breath. Electronically signed by: Oneil Devonshire MD 07/24/2024 02:15 AM EST RP Workstation: [id number]   DG ABDOMEN PEG TUBE LOCATION Result Date: 07/24/2024 EXAM: 1 VIEW XRAY OF THE ABDOMEN 07/24/2024 01:58:08 AM COMPARISON: None available. CLINICAL HISTORY: S/P percutaneous endoscopic gastrostomy (PEG) tube placement (HCC) FINDINGS: BOWEL: Nonobstructive bowel gas pattern. Contrast material opacifies the stomach and duodenal C-loop. SOFT TISSUES: Gastrostomy tube is in place overlying the mid body of the stomach. No opaque urinary calculi. BONES: No acute osseous abnormality. IMPRESSION: 1. Gastrostomy tube appropriately positioned within the stomach with contrast opacifying the stomach and duodenal C-loop. Electronically signed by: Oneil Devonshire MD 07/24/2024 02:13 AM EST RP Workstation: [id number]   DG Chest 2 View Result Date: 07/22/2024 EXAM: 2 VIEW(S) XRAY OF THE CHEST 07/22/2024 08:34:16 PM COMPARISON: 07/03/2024 CLINICAL HISTORY: productive cough, eval infiltrate FINDINGS: LUNGS AND PLEURA: No focal pulmonary opacity. No pleural effusion. No pneumothorax. HEART AND MEDIASTINUM: No acute abnormality of the cardiac and mediastinal silhouettes. BONES AND SOFT TISSUES: No acute osseous abnormality. IMPRESSION: 1. No acute cardiopulmonary process. Electronically signed by: Elsie Gravely MD 07/22/2024 08:39 PM EST RP Workstation: [id number]    ECHO as above  TELEMETRY (personally reviewed): Sinus rhythm 90s-100s  EKG (personally reviewed): sinus tach rate 114 bpm, repeat shows aflutter vs more likely sinus tach rate 140 bpm  Data reviewed by me 08/18/2024: last 24h vitals tele labs imaging I/O ED provider note, admission H&P, hospitalist progress note,  oncology notes  Principal Problem:   Intractable nausea and vomiting Active Problems:   Hypokalemia   Hyponatremia   Hypomagnesemia   Essential hypertension   Anxiety   GERD without esophagitis   Pancytopenia (HCC)    ASSESSMENT AND PLAN:  Rashiya Lofland is a 28 y.o. female  with a past medical history of asthma, GERD, essential hypertension, cannabinoid hyperemesis syndrome and head and neck cancer s/p chemotherapy and radiation who presented to the ED on 08/16/2024 for nausea/vomiting. EKG overnight in the ED with concern for afib. Cardiology was consulted for further evaluation.   # Sinus tachycardia # Electrolyte derangements # Stage III EBV positive  nasopharyngeal carcinoma, s/p chemo & radiation # Hypertension Patient presented due to nausea/vomiting, feeding tube issue. Noted to be tachycardic in the ED, EKGs sinus tach rate 114 bpm, repeat shows aflutter vs more likely sinus tach rate 140 bpm. K 3.0 on admission down to 2.7 on AM labs. Magnesium  1.2 on admission. Severe leukopenia with WBC 1.1.  Echo this admission with EF 55-60%, no wall motion abnormalities, normal diastolic function, GLS normal, normal RV size and function, no MR or MS, no AR or AAS. -Low suspicion for AF/AFL given age, lack of risk factors. Suspect sinus tachycardia 2/2 underlying electrolyte derangements.  -Monitor and replenish electrolytes for a goal K >4, Mag >2     Cardiology will sign off. Please haiku with questions or re-engage if needed. Follow up with Tinnie Maiden, NP in 1 month.   This patient's plan of care was discussed and created with Dr. Florencio and he is in agreement.  Signed: Danita Bloch, PA-C  08/18/2024, 8:14 AM [facility]      "

## 2024-08-19 ENCOUNTER — Other Ambulatory Visit: Payer: Self-pay

## 2024-08-19 ENCOUNTER — Encounter: Payer: Self-pay | Admitting: Oncology

## 2024-08-19 ENCOUNTER — Telehealth: Payer: Self-pay | Admitting: Emergency Medicine

## 2024-08-19 DIAGNOSIS — D72819 Decreased white blood cell count, unspecified: Secondary | ICD-10-CM | POA: Insufficient documentation

## 2024-08-19 DIAGNOSIS — E876 Hypokalemia: Secondary | ICD-10-CM | POA: Diagnosis not present

## 2024-08-19 DIAGNOSIS — R7881 Bacteremia: Secondary | ICD-10-CM | POA: Insufficient documentation

## 2024-08-19 DIAGNOSIS — I10 Essential (primary) hypertension: Secondary | ICD-10-CM | POA: Diagnosis not present

## 2024-08-19 DIAGNOSIS — R112 Nausea with vomiting, unspecified: Secondary | ICD-10-CM | POA: Diagnosis not present

## 2024-08-19 DIAGNOSIS — D649 Anemia, unspecified: Secondary | ICD-10-CM | POA: Insufficient documentation

## 2024-08-19 LAB — BLOOD CULTURE ID PANEL (REFLEXED) - BCID2

## 2024-08-19 LAB — BASIC METABOLIC PANEL WITH GFR
Anion gap: 10 (ref 5–15)
BUN: <5 mg/dL — ABNORMAL LOW (ref 6–20)
CO2: 25 mmol/L (ref 22–32)
Calcium: 9 mg/dL (ref 8.9–10.3)
Chloride: 100 mmol/L (ref 98–111)
Creatinine, Ser: 0.56 mg/dL (ref 0.44–1.00)
GFR, Estimated: >60 mL/min
Glucose, Bld: 97 mg/dL (ref 70–99)
Potassium: 3.5 mmol/L (ref 3.5–5.1)
Sodium: 135 mmol/L (ref 135–145)

## 2024-08-19 LAB — GLUCOSE, CAPILLARY
Glucose-Capillary: 94 mg/dL (ref 70–99)
Glucose-Capillary: 112 mg/dL — ABNORMAL HIGH (ref 70–99)

## 2024-08-19 LAB — MAGNESIUM: Magnesium: 1.4 mg/dL — ABNORMAL LOW (ref 1.7–2.4)

## 2024-08-19 MED ORDER — POTASSIUM CHLORIDE 20 MEQ PO PACK
40.0000 meq | PACK | Freq: Once | ORAL | Status: AC
  Administered 2024-08-19: 40 meq
  Filled 2024-08-19: qty 2

## 2024-08-19 MED ORDER — MAGNESIUM OXIDE 400 MG PO TABS
400.0000 mg | ORAL_TABLET | Freq: Two times a day (BID) | ORAL | 0 refills | Status: AC
  Filled 2024-08-19: qty 20, 10d supply, fill #0

## 2024-08-19 MED ORDER — MAGNESIUM SULFATE 4 GM/100ML IV SOLN
4.0000 g | Freq: Once | INTRAVENOUS | Status: AC
  Administered 2024-08-19: 4 g via INTRAVENOUS
  Filled 2024-08-19: qty 100

## 2024-08-19 MED ORDER — METOPROLOL SUCCINATE ER 25 MG PO TB24
25.0000 mg | ORAL_TABLET | Freq: Every day | ORAL | 0 refills | Status: AC
  Filled 2024-08-19: qty 30, 30d supply, fill #0

## 2024-08-19 NOTE — Discharge Summary (Signed)
 " Physician Discharge Summary   Patient: Jacqlyn Marolf MRN: [id number] DOB: 1995-12-01  Admit date:     08/16/2024  Discharge date: 08/19/2024  Discharge Physician: Harnoor Kohles   PCP: [name], MD   Recommendations at discharge:    Take electrolytes as recommended Follow-up with oncology and PCP as an outpatient  Discharge Diagnoses: Principal Problem:   Intractable nausea and vomiting Active Problems:   Hypokalemia   Hypomagnesemia   Hyponatremia   Pancytopenia (HCC)   Essential hypertension   Anxiety   GERD without esophagitis  Resolved Problems:   * No resolved hospital problems. *  Hospital Course:  Harry Bark is a 28 y.o. 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  She admitted to dark vomitus and not typically coffee-ground.  She thought her feeding tube was clogged.  Her family tried to flush it slowly at home.  She has throat pain that is chronic.  No fever or chills.  No bilious vomitus or hematemesis.  No diarrhea or melena or bright red in per rectum.  No dysuria, oliguria or hematuria or flank pain.    12/17 -had significant electrolyte abnormalities on admission which include hypokalemia and hypomagnesemia.  Cardiology consulted for concerns of A-fib   12/18 -no further episodes of nausea.  Improved potassium and magnesium  levels.  Leukopenia persists.   12/19 -had an episode of emesis while I was making rounds.  Had a bowel movement.  No abdominal pain     Assessment and Plan:  Intractable nausea and vomiting Most likely related to recent chemotherapy Improved Continue supportive care with antiemetics and PPI       Hypokalemia/hypomagnesemia Secondary to GI losses from nausea  and vomiting Supplement potassium and magnesium      Hypovolemic hyponatremia Secondary to GI losses from nausea and vomiting Improved with IV fluid hydration     Pancytopenia (HCC) Secondary to recent chemotherapy Appreciate oncology input Does not require any blood transfusion at this time or any intervention     GERD without esophagitis Continue PPI     Anxiety Continue as needed lorazepam      Essential hypertension Continue amlodipine      Tachycardia Concerning for possible atrial fibrillation most likely related to volume depletion from nausea and vomiting Appreciate cardiology input, strips reviewed and thought to be more sinus tachycardia Recommend to monitor and supplement electrolytes Patient had a 2D echocardiogram which showed normal LVEF with no valvular pathology. Continue metoprolol        Obesity Class I BMI 35.6 Complicates overall prognosis and care Lifestyle modification and exercise has been discussed with patient in detail         Consultants: Oncology Procedures performed: None  Disposition: Home Diet recommendation:  Discharge Diet Orders (From admission, onward)     Start     Ordered   08/19/24 0000  Diet clear liquid        08/19/24 1027           Clear liquid diet DISCHARGE MEDICATION: Allergies as of 08/19/2024       Reactions   Shellfish Allergy Anaphylaxis   Tomato Other (See Comments)   Flares up exema   Droperidol  Anxiety   Felt jittery and became tachycardic after droperidol         Medication List  STOP taking these medications    cyclobenzaprine  10 MG tablet Commonly known as: FLEXERIL    lactulose  10 GM/15ML solution Commonly known as: CHRONULAC    metoCLOPramide  5 MG/5ML solution Commonly known as: REGLAN    pantoprazole  40 MG tablet Commonly known as: PROTONIX    phenol 1.4 % Liqd Commonly known as: CHLORASEPTIC       TAKE these medications    ALPRAZolam  0.25 MG tablet Commonly  known as: XANAX  Take 1 tablet (0.25 mg total) by mouth at bedtime as needed for anxiety.   amLODipine  5 MG tablet Commonly known as: NORVASC  Place 1 tablet (5 mg total) into feeding tube daily.   feeding supplement (OSMOLITE 1.5 CAL) Liqd Place 119 mLs into feeding tube 6 (six) times daily.   Ensure Clear Liqd Take 1 Bottle by mouth 2 (two) times daily.   free water  Soln Place 60 mLs into feeding tube 6 (six) times daily.   LORazepam  0.5 MG tablet Commonly known as: ATIVAN  Take 0.5 mg by mouth every 8 (eight) hours as needed for anxiety.   magnesium  oxide 400 (240 Mg) MG tablet Commonly known as: MagOx 400 1 tablet (400 mg total) by Per NG tube route 2 (two) times daily for 10 days.   metoprolol  succinate 25 MG 24 hr tablet Commonly known as: TOPROL -XL Take 1 tablet (25 mg total) by mouth daily. Start taking on: August 20, 2024   omeprazole  20 MG capsule Commonly known as: PRILOSEC Take 1 capsule (20 mg total) by mouth daily. Open capsule pour into water  administer through tube flush with water  after administration   ondansetron  4 MG tablet Commonly known as: Zofran  Place 2 tablets (8 mg total) into feeding tube every 8 (eight) hours as needed for nausea or vomiting.   oxycodone  5 MG capsule Commonly known as: OXY-IR Take 5 mg by mouth every 4 (four) hours as needed.   potassium chloride  20 MEQ/15ML (10%) Soln Take 15 mLs (20 mEq total) by mouth daily.   prochlorperazine  10 MG tablet Commonly known as: COMPAZINE  Take 1 tablet (10 mg total) by mouth every 8 (eight) hours as needed for nausea or vomiting.   Slynd  4 MG Tabs Generic drug: Drospirenone  Take 1 tablet by mouth daily.        Follow-up Information     [name], MD Follow up.   Specialty: Internal Medicine Why: hospital follow up Contact information: [address] [place] [address] [phone]         Launie Elodie Ronal Tinnie, NP. Go in 1 month(s).   Specialty: Nurse  Practitioner Contact information: [address] [place] [address] [phone]                Discharge Exam: Fredricka Weights   08/16/24 0022 08/16/24 0418  Weight: 84 kg 88.5 kg   GENERAL:  28 y.o.-year-old African-American patient lying in the bed with no acute distress.  EYES: Pupils equal, round, reactive to light and accommodation. No scleral icterus. Extraocular muscles intact.  HEENT: Head atraumatic, normocephalic. Oropharynx and nasopharynx clear.  NECK:  Supple, no jugular venous distention. No thyroid  enlargement, no tenderness.  LUNGS: Normal breath sounds bilaterally, no wheezing, rales,rhonchi or crepitation. No use of accessory muscles of respiration.  CARDIOVASCULAR: Regular rate and rhythm, S1, S2 normal. No murmurs, rubs, or gallops.  ABDOMEN: Soft, nondistended, nontender. Bowel sounds present. No organomegaly or mass.  EXTREMITIES: No pedal edema, cyanosis, or clubbing.  NEUROLOGIC: Cranial nerves II through XII are intact. Muscle strength 5/5  in all extremities. Sensation intact. Gait not checked.  PSYCHIATRIC: The patient is alert and oriented x 3.  Normal affect and good eye contact. SKIN: No obvious rash, lesion, or ulcer.      Condition at discharge: stable  The results of significant diagnostics from this hospitalization (including imaging, microbiology, ancillary and laboratory) are listed below for reference.   Imaging Studies: ECHOCARDIOGRAM COMPLETE Result Date: 08/17/2024    ECHOCARDIOGRAM REPORT   Patient Name:   [facility] Date of Exam: 08/16/2024 Medical Rec #:  [id number]    Height:       62.0 in Accession #:    [id number]   Weight:       195.1 lb Date of Birth:  August 11, 1996   BSA:          1.892 m Patient Age:    28 years     BP:           107/61 mmHg Patient Gender: F            HR:           112 bpm. Exam Location:  [facility] Procedure: 2D Echo, 3D Echo, Cardiac Doppler, Color Doppler and Strain Analysis            (Both Spectral and  Color Flow Doppler were utilized during            procedure). Indications:     R07.9 Chest pain                  Z09 Chemo  History:         Patient has prior history of Echocardiogram examinations, most                  recent 03/02/2019. Risk Factors:Hypertension.  Sonographer:     Carl Coma RDCS Referring Phys:  [id number] CARALYN HUDSON Diagnosing Phys: Marsa Dooms MD IMPRESSIONS  1. Left ventricular ejection fraction, by estimation, is 55 to 60%. The left ventricle has normal function. The left ventricle has no regional wall motion abnormalities. Left ventricular diastolic parameters were normal. The average left ventricular global longitudinal strain is -21.4 %. The global longitudinal strain is normal.  2. Right ventricular systolic function is normal. The right ventricular size is normal.  3. The mitral valve is normal in structure. No evidence of mitral valve regurgitation. No evidence of mitral stenosis.  4. The aortic valve is normal in structure. Aortic valve regurgitation is not visualized. No aortic stenosis is present.  5. The inferior vena cava is normal in size with greater than 50% respiratory variability, suggesting right atrial pressure of 3 mmHg. FINDINGS  Left Ventricle: Left ventricular ejection fraction, by estimation, is 55 to 60%. The left ventricle has normal function. The left ventricle has no regional wall motion abnormalities. The average left ventricular global longitudinal strain is -21.4 %. Strain was performed and the global longitudinal strain is normal. The left ventricular internal cavity size was normal in size. There is no left ventricular hypertrophy. Left ventricular diastolic parameters were normal. Right Ventricle: The right ventricular size is normal. No increase in right ventricular wall thickness. Right ventricular systolic function is normal. Left Atrium: Left atrial size was normal in size. Right Atrium: Right atrial size was normal in size.  Pericardium: There is no evidence of pericardial effusion. Mitral Valve: The mitral valve is normal in structure. No evidence of mitral valve regurgitation. No evidence of mitral valve stenosis. Tricuspid Valve: The tricuspid valve is normal  in structure. Tricuspid valve regurgitation is not demonstrated. No evidence of tricuspid stenosis. Aortic Valve: The aortic valve is normal in structure. Aortic valve regurgitation is not visualized. No aortic stenosis is present. Pulmonic Valve: The pulmonic valve was normal in structure. Pulmonic valve regurgitation is not visualized. No evidence of pulmonic stenosis. Aorta: The aortic root is normal in size and structure. Venous: The inferior vena cava is normal in size with greater than 50% respiratory variability, suggesting right atrial pressure of 3 mmHg. IAS/Shunts: No atrial level shunt detected by color flow Doppler. Additional Comments: 3D was performed not requiring image post processing on an independent workstation and was normal.  LEFT VENTRICLE PLAX 2D LVIDd:         4.30 cm   Diastology LVIDs:         3.00 cm   LV e' medial:    9.82 cm/s LV PW:         1.00 cm   LV E/e' medial:  8.6 LV IVS:        1.00 cm   LV e' lateral:   14.60 cm/s LVOT diam:     1.80 cm   LV E/e' lateral: 5.8 LV SV:         37 LV SV Index:   19        2D Longitudinal Strain LVOT Area:     2.54 cm  2D Strain GLS (A4C):   -19.8 %                          2D Strain GLS (A3C):   -23.3 %                          2D Strain GLS (A2C):   -21.0 %                          2D Strain GLS Avg:     -21.4 %                           3D Volume EF:                          3D EF:        57 %                          LV EDV:       141 ml                          LV ESV:       61 ml                          LV SV:        80 ml RIGHT VENTRICLE             IVC RV Basal diam:  3.10 cm     IVC diam: 1.60 cm RV S prime:     11.10 cm/s TAPSE (M-mode): 2.3 cm LEFT ATRIUM             Index        RIGHT ATRIUM           Index LA diam:  3.80 cm 2.01 cm/m   RA Area:     8.43 cm LA Vol (A2C):   34.1 ml 18.03 ml/m  RA Volume:   16.00 ml 8.46 ml/m LA Vol (A4C):   26.5 ml 14.01 ml/m LA Biplane Vol: 30.5 ml 16.12 ml/m  AORTIC VALVE LVOT Vmax:   105.10 cm/s LVOT Vmean:  69.500 cm/s LVOT VTI:    0.144 m  AORTA Ao Root diam: 3.00 cm MITRAL VALVE MV Area (PHT): 5.72 cm    SHUNTS MV Decel Time: 133 msec    Systemic VTI:  0.14 m MV E velocity: 84.20 cm/s  Systemic Diam: 1.80 cm MV A velocity: 83.50 cm/s MV E/A ratio:  1.01 Marsa Dooms MD Electronically signed by Marsa Dooms MD Signature Date/Time: 08/17/2024/12:53:30 PM    Final    DG Shoulder Left Result Date: 08/05/2024 EXAM: 1 VIEW(S) XRAY OF THE LEFT SHOULDER 08/05/2024 01:58:00 AM COMPARISON: None available. CLINICAL HISTORY: fall FINDINGS: BONES AND JOINTS: Glenohumeral joint is normally aligned. No acute fracture. No malalignment. The [facility] joint is unremarkable. SOFT TISSUES: No abnormal calcifications. Visualized lung is unremarkable. IMPRESSION: 1. No acute fracture or dislocation. Electronically signed by: Morgane Naveau MD 08/05/2024 02:05 AM EST RP Workstation: [id number]   DG Chest 2 View Result Date: 08/05/2024 EXAM: 2 VIEW(S) XRAY OF THE CHEST 08/05/2024 01:58:00 AM COMPARISON: 07/24/2024 CLINICAL HISTORY: cp FINDINGS: LUNGS AND PLEURA: No focal pulmonary opacity. No pleural effusion. No pneumothorax. HEART AND MEDIASTINUM: No acute abnormality of the cardiac and mediastinal silhouettes. BONES AND SOFT TISSUES: No acute osseous abnormality. IMPRESSION: 1. No acute cardiopulmonary process. Electronically signed by: Morgane Naveau MD 08/05/2024 02:05 AM EST RP Workstation: [id number]   DG Chest Port 1 View Result Date: 07/24/2024 EXAM: 1 VIEW(S) XRAY OF THE CHEST 07/24/2024 01:58:08 AM COMPARISON: 07/22/2024 CLINICAL HISTORY: SOB (shortness of breath) FINDINGS: LUNGS AND PLEURA: No focal pulmonary opacity. No pleural effusion. No pneumothorax.  HEART AND MEDIASTINUM: No acute abnormality of the cardiac and mediastinal silhouettes. BONES AND SOFT TISSUES: No acute osseous abnormality. IMPRESSION: 1. No acute cardiopulmonary process to explain shortness of breath. Electronically signed by: Oneil Devonshire MD 07/24/2024 02:15 AM EST RP Workstation: [id number]   DG ABDOMEN PEG TUBE LOCATION Result Date: 07/24/2024 EXAM: 1 VIEW XRAY OF THE ABDOMEN 07/24/2024 01:58:08 AM COMPARISON: None available. CLINICAL HISTORY: S/P percutaneous endoscopic gastrostomy (PEG) tube placement (HCC) FINDINGS: BOWEL: Nonobstructive bowel gas pattern. Contrast material opacifies the stomach and duodenal C-loop. SOFT TISSUES: Gastrostomy tube is in place overlying the mid body of the stomach. No opaque urinary calculi. BONES: No acute osseous abnormality. IMPRESSION: 1. Gastrostomy tube appropriately positioned within the stomach with contrast opacifying the stomach and duodenal C-loop. Electronically signed by: Oneil Devonshire MD 07/24/2024 02:13 AM EST RP Workstation: [id number]   DG Chest 2 View Result Date: 07/22/2024 EXAM: 2 VIEW(S) XRAY OF THE CHEST 07/22/2024 08:34:16 PM COMPARISON: 07/03/2024 CLINICAL HISTORY: productive cough, eval infiltrate FINDINGS: LUNGS AND PLEURA: No focal pulmonary opacity. No pleural effusion. No pneumothorax. HEART AND MEDIASTINUM: No acute abnormality of the cardiac and mediastinal silhouettes. BONES AND SOFT TISSUES: No acute osseous abnormality. IMPRESSION: 1. No acute cardiopulmonary process. Electronically signed by: Elsie Gravely MD 07/22/2024 08:39 PM EST RP Workstation: [id number]    Microbiology: Results for orders placed or performed during the hospital encounter of 08/16/24  Blood culture (routine x 2)     Status: None (Preliminary result)   Collection Time: 08/16/24  3:10 AM   Specimen: BLOOD  Result Value Ref Range Status  Specimen Description BLOOD BLOOD RIGHT HAND  Final   Special Requests   Final    BOTTLES DRAWN  AEROBIC AND ANAEROBIC Blood Culture results may not be optimal due to an inadequate volume of blood received in culture bottles   Culture   Final    NO GROWTH 3 DAYS Performed at [facility], [address] [place], [place] [address]    Report Status PENDING  Incomplete  Culture, blood (Routine X 2) w Reflex to ID Panel     Status: None (Preliminary result)   Collection Time: 08/17/24  6:25 AM   Specimen: BLOOD  Result Value Ref Range Status   Specimen Description BLOOD BLOOD RIGHT HAND  Final   Special Requests   Final    BOTTLES DRAWN AEROBIC AND ANAEROBIC Blood Culture adequate volume   Culture   Final    NO GROWTH 2 DAYS Performed at [facility], [address] [place], [place] [address]    Report Status PENDING  Incomplete    Labs: CBC: Recent Labs  Lab 08/16/24 0110 08/16/24 0613 08/17/24 0625 08/18/24 0418  WBC 1.8* 1.7* 1.1* 1.1*  NEUTROABS 1.4*  --  0.7*  --   HGB 9.2* 8.4* 7.9* 7.4*  HCT 25.8* 23.8* 23.4* 22.5*  MCV 92.1 91.9 96.7 98.3  PLT 149* 139* 150 179   Basic Metabolic Panel: Recent Labs  Lab 08/16/24 0110 08/16/24 0613 08/16/24 1927 08/17/24 0625 08/18/24 0418 08/19/24 0930  NA 131* 135 136 136 135 135  K 3.0* 2.7* 3.0* 3.6 3.3* 3.5  CL 89* 96* 100 100 100 100  CO2 24 26 21* 22 22 25   GLUCOSE 126* 115* 126* 93 132* 97  BUN 8 6 6 7  <5* <5*  CREATININE 0.83 0.70 0.77 0.66 0.59 0.56  CALCIUM 9.7 9.0 8.4* 8.6* 8.7* 9.0  MG 1.2*  --  1.6* 1.5* 1.6* 1.4*   Liver Function Tests: Recent Labs  Lab 08/16/24 0110  AST 27  ALT 31  ALKPHOS 71  BILITOT 0.5  PROT 6.9  ALBUMIN 4.0   CBG: Recent Labs  Lab 08/18/24 2007 08/19/24 0840  GLUCAP 109* 94    Discharge time spent: greater than 30 minutes.  Signed: Aimee Somerset, MD [facility] 08/19/2024 "

## 2024-08-19 NOTE — Plan of Care (Signed)
  Problem: Education: Goal: Knowledge of General Education information will improve Description: Including pain rating scale, medication(s)/side effects and non-pharmacologic comfort measures Outcome: Progressing   Problem: Health Behavior/Discharge Planning: Goal: Ability to manage health-related needs will improve Outcome: Progressing   Problem: Clinical Measurements: Goal: Ability to maintain clinical measurements within normal limits will improve Outcome: Progressing   Problem: Activity: Goal: Risk for activity intolerance will decrease Outcome: Progressing   Problem: Nutrition: Goal: Adequate nutrition will be maintained Outcome: Progressing   Problem: Pain Managment: Goal: General experience of comfort will improve and/or be controlled Outcome: Progressing   Problem: Safety: Goal: Ability to remain free from injury will improve Outcome: Progressing   Problem: Skin Integrity: Goal: Risk for impaired skin integrity will decrease Outcome: Progressing

## 2024-08-19 NOTE — Plan of Care (Signed)
  Problem: Health Behavior/Discharge Planning: Goal: Ability to manage health-related needs will improve Outcome: Progressing   Problem: Clinical Measurements: Goal: Will remain free from infection Outcome: Progressing Goal: Cardiovascular complication will be avoided Outcome: Progressing   Problem: Nutrition: Goal: Adequate nutrition will be maintained Outcome: Progressing   Problem: Pain Managment: Goal: General experience of comfort will improve and/or be controlled Outcome: Progressing

## 2024-08-19 NOTE — ED Triage Notes (Signed)
 Pt called back for postive blood culture after recent admission. Per report 1/4 cultures was +gram positive cocci. Pt denies complaints or medical needs at this time.

## 2024-08-20 ENCOUNTER — Emergency Department
Admission: EM | Admit: 2024-08-20 | Discharge: 2024-08-20 | Disposition: A | Attending: Emergency Medicine | Admitting: Emergency Medicine

## 2024-08-20 DIAGNOSIS — E876 Hypokalemia: Secondary | ICD-10-CM

## 2024-08-20 DIAGNOSIS — R7881 Bacteremia: Secondary | ICD-10-CM

## 2024-08-20 DIAGNOSIS — D649 Anemia, unspecified: Secondary | ICD-10-CM

## 2024-08-20 LAB — CBC WITH DIFFERENTIAL/PLATELET
Abs Immature Granulocytes: 0.02 K/uL (ref 0.00–0.07)
Basophils Absolute: 0 K/uL (ref 0.0–0.1)
Basophils Relative: 1 %
Eosinophils Absolute: 0 K/uL (ref 0.0–0.5)
Eosinophils Relative: 1 %
HCT: 23.2 % — ABNORMAL LOW (ref 36.0–46.0)
Hemoglobin: 7.7 g/dL — ABNORMAL LOW (ref 12.0–15.0)
Immature Granulocytes: 1 %
Lymphocytes Relative: 12 %
Lymphs Abs: 0.2 K/uL — ABNORMAL LOW (ref 0.7–4.0)
MCH: 32.6 pg (ref 26.0–34.0)
MCHC: 33.2 g/dL (ref 30.0–36.0)
MCV: 98.3 fL (ref 80.0–100.0)
Monocytes Absolute: 0.3 K/uL (ref 0.1–1.0)
Monocytes Relative: 19 %
Neutro Abs: 0.9 K/uL — ABNORMAL LOW (ref 1.7–7.7)
Neutrophils Relative %: 66 %
Platelets: 309 K/uL (ref 150–400)
RBC: 2.36 MIL/uL — ABNORMAL LOW (ref 3.87–5.11)
RDW: 20.4 % — ABNORMAL HIGH (ref 11.5–15.5)
WBC: 1.4 K/uL — CL (ref 4.0–10.5)
nRBC: 3.6 % — ABNORMAL HIGH (ref 0.0–0.2)

## 2024-08-20 LAB — COMPREHENSIVE METABOLIC PANEL WITH GFR
ALT: 29 U/L (ref 0–44)
AST: 18 U/L (ref 15–41)
Albumin: 3.7 g/dL (ref 3.5–5.0)
Alkaline Phosphatase: 72 U/L (ref 38–126)
Anion gap: 14 (ref 5–15)
BUN: <5 mg/dL — ABNORMAL LOW (ref 6–20)
CO2: 23 mmol/L (ref 22–32)
Calcium: 9.1 mg/dL (ref 8.9–10.3)
Chloride: 96 mmol/L — ABNORMAL LOW (ref 98–111)
Creatinine, Ser: 0.62 mg/dL (ref 0.44–1.00)
GFR, Estimated: >60 mL/min
Glucose, Bld: 107 mg/dL — ABNORMAL HIGH (ref 70–99)
Potassium: 3.3 mmol/L — ABNORMAL LOW (ref 3.5–5.1)
Sodium: 133 mmol/L — ABNORMAL LOW (ref 135–145)
Total Bilirubin: 0.3 mg/dL (ref 0.0–1.2)
Total Protein: 6.5 g/dL (ref 6.5–8.1)

## 2024-08-20 LAB — MAGNESIUM: Magnesium: 2 mg/dL (ref 1.7–2.4)

## 2024-08-20 MED ORDER — POTASSIUM CHLORIDE CRYS ER 20 MEQ PO TBCR: 40.0000 meq | EXTENDED_RELEASE_TABLET | Freq: Once | ORAL | Status: DC

## 2024-08-20 NOTE — ED Provider Notes (Signed)
 "  [facility] Provider Note    Event Date/Time   First MD Initiated Contact with Patient 08/20/24 0124     (approximate)   History   Chief Complaint Abnormal Lab   HPI  Brandi [name] is a 28 y.o. female with past medical history of hypertension, cannabinoid hyperemesis, and nasopharyngeal carcinoma who presents to the ED complaining of abnormal labs.  Patient was recently admitted to the hospital for intractable nausea and vomiting with electrolyte imbalances, had blood cultures drawn at that time.  She was discharged from the hospital yesterday and states she has been feeling better, but received a phone call that one of her blood cultures had come back positive today.  She had gram-positive cocci growing in one of the 4 bottles, was told to come to the ED for repeat sample.  She denies any fevers, cough, chest pain, shortness of breath, rash, dysuria.      Physical Exam   Triage Vital Signs: ED Triage Vitals  Encounter Vitals Group     BP 08/19/24 2245 (!) 143/101     Girls Systolic BP Percentile --      Girls Diastolic BP Percentile --      Boys Systolic BP Percentile --      Boys Diastolic BP Percentile --      Pulse Rate 08/19/24 2245 (!) 101     Resp 08/19/24 2245 18     Temp 08/19/24 2245 98.3 F (36.8 C)     Temp Source 08/19/24 2245 Oral     SpO2 08/19/24 2245 100 %     Weight 08/19/24 2243 195 lb 1.7 oz (88.5 kg)     Height 08/19/24 2243 5' 2 (1.575 m)     Head Circumference --      Peak Flow --      Pain Score 08/19/24 2243 0     Pain Loc --      Pain Education --      Exclude from Growth Chart --     Most recent vital signs: Vitals:   08/19/24 2245 08/20/24 0315  BP: (!) 143/101 137/86  Pulse: (!) 101 93  Resp: 18 18  Temp: 98.3 F (36.8 C) 99 F (37.2 C)  SpO2: 100% 100%    Constitutional: Alert and oriented. Eyes: Conjunctivae are normal. Head: Atraumatic. Nose: No congestion/rhinnorhea. Mouth/Throat: Mucous  membranes are moist.  Cardiovascular: Normal rate, regular rhythm. Grossly normal heart sounds.  2+ radial pulses bilaterally. Respiratory: Normal respiratory effort.  No retractions. Lungs CTAB. Gastrointestinal: Soft and nontender. No distention. Musculoskeletal: No lower extremity tenderness nor edema.  Neurologic:  Normal speech and language. No gross focal neurologic deficits are appreciated.    ED Results / Procedures / Treatments   Labs (all labs ordered are listed, but only abnormal results are displayed) Labs Reviewed  CBC WITH DIFFERENTIAL/PLATELET - Abnormal; Notable for the following components:      Result Value   WBC 1.4 (*)    RBC 2.36 (*)    Hemoglobin 7.7 (*)    HCT 23.2 (*)    RDW 20.4 (*)    nRBC 3.6 (*)    Neutro Abs 0.9 (*)    Lymphs Abs 0.2 (*)    All other components within normal limits  COMPREHENSIVE METABOLIC PANEL WITH GFR - Abnormal; Notable for the following components:   Sodium 133 (*)    Potassium 3.3 (*)    Chloride 96 (*)    Glucose, Bld 107 (*)  BUN <5 (*)    All other components within normal limits  CULTURE, BLOOD (ROUTINE X 2)  CULTURE, BLOOD (ROUTINE X 2)  MAGNESIUM     PROCEDURES:  Critical Care performed: No  Procedures   MEDICATIONS ORDERED IN ED: Medications  potassium chloride  SA (KLOR-CON  M) CR tablet 40 mEq (40 mEq Oral Patient Refused/Not Given 08/20/24 0321)     IMPRESSION / MDM / ASSESSMENT AND PLAN / ED COURSE  I reviewed the triage vital signs and the nursing notes.                              28 y.o. female with past medical history of hypertension, nasopharyngeal carcinoma, and cannabinoid hyperemesis who presents to the ED complaining of positive blood culture drawn on 12/17.  Patient's presentation is most consistent with acute presentation with potential threat to life or bodily function.  Differential diagnosis includes, but is not limited to, sepsis, bacteremia, electrolyte abnormality.  Patient  nontoxic-appearing and in no acute distress, vital signs remarkable for mild tachycardia but otherwise reassuring.  Patient states that she is feeling well following recent hospitalization, low suspicion for sepsis at this time as she has no infectious symptoms and vital signs are reassuring.  We will repeat labs to ensure electrolyte abnormalities have improved, also recheck blood cultures.  At this point, suspect contamination given gram-positive cocci in 1 out of 4 bottles.  Labs show mild hypokalemia at 3.3, but magnesium  level improved and renal function stable, LFTs also unremarkable.  CBC shows stable chronic anemia as well as leukopenia, likely secondary to patient's recent chemotherapy.  With reassuring workup and no signs of infection, patient appropriate for discharge home with outpatient follow-up.  She was counseled to return to the ED for new or worsening symptoms, patient agrees with plan.      FINAL CLINICAL IMPRESSION(S) / ED DIAGNOSES   Final diagnoses:  Positive blood culture  Chronic anemia  Hypokalemia     Rx / DC Orders   ED Discharge Orders     None        Note:  This document was prepared using Dragon voice recognition software and may include unintentional dictation errors.   Willo Dunnings, MD 08/20/24 [phone]  "

## 2024-08-20 NOTE — ED Notes (Signed)
 Informed by Triage Nurse Tech that labs and one set of cultures were drawn while pt was out in triage.  Called lab to confirm blood was there now that the attending ER MD had entered orders for labs, advised by lab tech that a rainbow of blood tubes and one set of cultures had been sent.  Will collect second set of blood cultures as ordered and send them to the lab.

## 2024-08-21 ENCOUNTER — Other Ambulatory Visit: Payer: Self-pay | Admitting: *Deleted

## 2024-08-21 DIAGNOSIS — C119 Malignant neoplasm of nasopharynx, unspecified: Secondary | ICD-10-CM

## 2024-08-21 DIAGNOSIS — R112 Nausea with vomiting, unspecified: Secondary | ICD-10-CM

## 2024-08-21 DIAGNOSIS — R531 Weakness: Secondary | ICD-10-CM

## 2024-08-21 DIAGNOSIS — E876 Hypokalemia: Secondary | ICD-10-CM

## 2024-08-21 LAB — CULTURE, BLOOD (ROUTINE X 2)
Culture  Setup Time: NO GROWTH
Culture: NO GROWTH
Special Requests: ADEQUATE

## 2024-08-21 MED FILL — Nutritional Supplement Liquid: ORAL | Qty: 237 | Status: AC

## 2024-08-22 ENCOUNTER — Inpatient Hospital Stay

## 2024-08-22 ENCOUNTER — Other Ambulatory Visit: Payer: Self-pay

## 2024-08-22 ENCOUNTER — Encounter: Payer: Self-pay | Admitting: Nurse Practitioner

## 2024-08-22 ENCOUNTER — Inpatient Hospital Stay (HOSPITAL_BASED_OUTPATIENT_CLINIC_OR_DEPARTMENT_OTHER): Admitting: Nurse Practitioner

## 2024-08-22 VITALS — BP 130/92 | HR 83 | Temp 97.6°F | Resp 18 | Ht 62.0 in | Wt 192.0 lb

## 2024-08-22 DIAGNOSIS — E876 Hypokalemia: Secondary | ICD-10-CM

## 2024-08-22 DIAGNOSIS — Z510 Encounter for antineoplastic radiation therapy: Secondary | ICD-10-CM | POA: Diagnosis not present

## 2024-08-22 DIAGNOSIS — D649 Anemia, unspecified: Secondary | ICD-10-CM

## 2024-08-22 DIAGNOSIS — C119 Malignant neoplasm of nasopharynx, unspecified: Secondary | ICD-10-CM

## 2024-08-22 DIAGNOSIS — E860 Dehydration: Secondary | ICD-10-CM | POA: Diagnosis not present

## 2024-08-22 DIAGNOSIS — R112 Nausea with vomiting, unspecified: Secondary | ICD-10-CM

## 2024-08-22 DIAGNOSIS — R531 Weakness: Secondary | ICD-10-CM

## 2024-08-22 LAB — CBC WITH DIFFERENTIAL/PLATELET
Abs Immature Granulocytes: 0.08 K/uL — ABNORMAL HIGH (ref 0.00–0.07)
Basophils Absolute: 0 K/uL (ref 0.0–0.1)
Basophils Relative: 0 %
Eosinophils Absolute: 0 K/uL (ref 0.0–0.5)
Eosinophils Relative: 0 %
HCT: 24.1 % — ABNORMAL LOW (ref 36.0–46.0)
Hemoglobin: 8 g/dL — ABNORMAL LOW (ref 12.0–15.0)
Immature Granulocytes: 4 %
Lymphocytes Relative: 13 %
Lymphs Abs: 0.3 K/uL — ABNORMAL LOW (ref 0.7–4.0)
MCH: 32.8 pg (ref 26.0–34.0)
MCHC: 33.2 g/dL (ref 30.0–36.0)
MCV: 98.8 fL (ref 80.0–100.0)
Monocytes Absolute: 0.4 K/uL (ref 0.1–1.0)
Monocytes Relative: 19 %
Neutro Abs: 1.4 K/uL — ABNORMAL LOW (ref 1.7–7.7)
Neutrophils Relative %: 64 %
Platelets: 426 K/uL — ABNORMAL HIGH (ref 150–400)
RBC: 2.44 MIL/uL — ABNORMAL LOW (ref 3.87–5.11)
RDW: 20.2 % — ABNORMAL HIGH (ref 11.5–15.5)
WBC: 2.3 K/uL — ABNORMAL LOW (ref 4.0–10.5)
nRBC: 3.5 % — ABNORMAL HIGH (ref 0.0–0.2)

## 2024-08-22 LAB — TSH: TSH: 1.04 u[IU]/mL (ref 0.350–4.500)

## 2024-08-22 LAB — CMP (CANCER CENTER ONLY)
ALT: 33 U/L (ref 0–44)
AST: 21 U/L (ref 15–41)
Albumin: 3.8 g/dL (ref 3.5–5.0)
Alkaline Phosphatase: 73 U/L (ref 38–126)
Anion gap: 11 (ref 5–15)
BUN: <5 mg/dL — ABNORMAL LOW (ref 6–20)
CO2: 27 mmol/L (ref 22–32)
Calcium: 9.6 mg/dL (ref 8.9–10.3)
Chloride: 100 mmol/L (ref 98–111)
Creatinine: 0.77 mg/dL (ref 0.44–1.00)
GFR, Estimated: >60 mL/min
Glucose, Bld: 131 mg/dL — ABNORMAL HIGH (ref 70–99)
Potassium: 3.6 mmol/L (ref 3.5–5.1)
Sodium: 138 mmol/L (ref 135–145)
Total Bilirubin: 0.2 mg/dL (ref 0.0–1.2)
Total Protein: 6.4 g/dL — ABNORMAL LOW (ref 6.5–8.1)

## 2024-08-22 LAB — IRON AND TIBC
Iron: 61 ug/dL (ref 28–170)
Saturation Ratios: 23 % (ref 10.4–31.8)
TIBC: 260 ug/dL (ref 250–450)
UIBC: 200 ug/dL

## 2024-08-22 LAB — FOLATE: Folate: 6.1 ng/mL

## 2024-08-22 LAB — FERRITIN: Ferritin: 301 ng/mL (ref 11–307)

## 2024-08-22 LAB — VITAMIN B12: Vitamin B-12: 1745 pg/mL — ABNORMAL HIGH (ref 180–914)

## 2024-08-22 LAB — MAGNESIUM: Magnesium: 1.6 mg/dL — ABNORMAL LOW (ref 1.7–2.4)

## 2024-08-22 MED ORDER — SODIUM CHLORIDE 0.9 % IV SOLN
Freq: Once | INTRAVENOUS | Status: AC
  Filled 2024-08-22: qty 250

## 2024-08-22 MED ORDER — MAGNESIUM SULFATE 2 GM/50ML IV SOLN
2.0000 g | Freq: Once | INTRAVENOUS | Status: AC
  Administered 2024-08-22: 2 g via INTRAVENOUS
  Filled 2024-08-22: qty 50

## 2024-08-22 NOTE — Progress Notes (Signed)
 Nutrition Follow-up:  Patient with stage III EBV positive nasopharyngeal carcinoma.  Completed chemotherapy (12/4) and radiation (12/12).  Met with patient during IV fluids.  Requesting to switch from osmolite and [employer] to ensure via feeding tube.  Patient says that she was using 4-5 bottles a day of osmolite and [employer] and they both upset her stomach. She confirmed that she was able to take a whole carton at a feeding.   Says that she has not tried the ensure via tube yet. Reports that her throat feels like it is being ripped open.  Drinking apple juice and gatorade only, orally per patient.  Reports bowel movement yesterday (loose, not diarrhea) times 2 but was given miralax  in the hospital.     Medications: zofran  to be refilled today  Labs: reviewed  Anthropometrics:   Weight 192 lb today  187 lb 14.4 oz on 12/9 192 lb 9.6 oz on 12/4 195 lb on 11/26 207 lb on 11/9 204 lb 2 oz on 10/29 210 lb on 10/22 219 lb on 10/19 235 lb on 02/29/24   Estimated Energy Needs  Kcals: 2000-2300 Protein: 95-114 g Fluid: >  NUTRITION DIAGNOSIS: Inadequate oral intake continues relying on feeding tube  Severe malnutrition continues   INTERVENTION:  Samples of ensure given to patient to try via feeding tube instead of osmolite 1.5 or [employer] 1.4 formula.   Goal would be for patient to use 6 cartons of ensure plus (1 carton 6 times a day) via feeding tube.  Flush with 60ml of water  before and after feeding.   Provides 2100 calories, 78 g protein, free water  (includes flush and in formula) Tube feeding regimen written for patient.  Patient able to read back to RD instructions for giving feeding.  Samples of ensure given to patient.  With the upcoming holiday instructed patient that if she runs out of samples to purchase ensure plus at local retail stores (coupons provided) until meets with RD again to assess tolerance. Patient verbalized understanding.   Patient will  continue to drink fluids orally as well.  Discussed paying attention to color of urine     MONITORING, EVALUATION, GOAL: weight trends, tube feeding tolerance, intake   NEXT VISIT: Wednesday, Dec 31 during fluids.   Raini Tiley B. Dasie SOLON, CSO, LDN Registered Dietitian [phone]

## 2024-08-22 NOTE — Patient Instructions (Signed)
 Magnesium Sulfate Injection What is this medication? MAGNESIUM SULFATE (mag NEE zee um SUL fate) prevents and treats low levels of magnesium in your body. It may also be used to prevent and treat seizures during pregnancy in people with high blood pressure disorders, such as preeclampsia or eclampsia. Magnesium plays an important role in maintaining the health of your muscles and nervous system. This medicine may be used for other purposes; ask your health care provider or pharmacist if you have questions. What should I tell my care team before I take this medication? They need to know if you have any of these conditions: Heart disease History of irregular heart beat Kidney disease An unusual or allergic reaction to magnesium sulfate, medications, foods, dyes, or preservatives Pregnant or trying to get pregnant Breast-feeding How should I use this medication? This medication is for infusion into a vein. It is given in a hospital or clinic setting. Talk to your care team about the use of this medication in children. While this medication may be prescribed for selected conditions, precautions do apply. Overdosage: If you think you have taken too much of this medicine contact a poison control center or emergency room at once. NOTE: This medicine is only for you. Do not share this medicine with others. What if I miss a dose? This does not apply. What may interact with this medication? Certain medications for anxiety or sleep Certain medications for seizures, such phenobarbital Digoxin Medications that relax muscles for surgery Narcotic medications for pain This list may not describe all possible interactions. Give your health care provider a list of all the medicines, herbs, non-prescription drugs, or dietary supplements you use. Also tell them if you smoke, drink alcohol, or use illegal drugs. Some items may interact with your medicine. What should I watch for while using this  medication? Your condition will be monitored carefully while you are receiving this medication. You may need blood work done while you are receiving this medication. What side effects may I notice from receiving this medication? Side effects that you should report to your care team as soon as possible: Allergic reactions--skin rash, itching, hives, swelling of the face, lips, tongue, or throat High magnesium level--confusion, drowsiness, facial flushing, redness, sweating, muscle weakness, fast or irregular heartbeat, trouble breathing Low blood pressure--dizziness, feeling faint or lightheaded, blurry vision Side effects that usually do not require medical attention (report to your care team if they continue or are bothersome): Headache Nausea This list may not describe all possible side effects. Call your doctor for medical advice about side effects. You may report side effects to FDA at 1-800-FDA-1088. Where should I keep my medication? This medication is given in a hospital or clinic and will not be stored at home. NOTE: This sheet is a summary. It may not cover all possible information. If you have questions about this medicine, talk to your doctor, pharmacist, or health care provider.  2024 Elsevier/Gold Standard (2021-04-30 00:00:00)

## 2024-08-22 NOTE — Progress Notes (Signed)
 Patient says that she feels better, due to the morphine  the hospital gave her while she was there. So she would like to discuss switching from taking the oxycodones to morphine ? She needs a refill on her Zofran , which I sent over to her pharmacy. She also wanted to see Joli, which I messaged and Joli has agreed to see the patient when ever I am ready for her. She says that her throat feeling like it is being ripped open is the only thing that is bothering her now.

## 2024-08-23 NOTE — Progress Notes (Signed)
 " [facility]  Telephone:([phone] [phone]  ID: Brandi [name] OB: 24-Jul-1996  MR#: [id number]  RDW#:[id number]  Patient Care Team: [name], MD as PCP - General (Internal Medicine) [name], MD as Consulting Physician (Radiation Oncology) [name], MD as Consulting Physician (Oncology) Alvia Olam BIRCH, RN as VBCI Care Management  CHIEF COMPLAINT: Stage III EBV positive nasopharyngeal carcinoma.  INTERVAL HISTORY: Patient presents today to symptom management clinic from radiation with reports of weakness, nausea and vomiting. She completed her final cycle of low-dose cisplatin  on December 4.  Completed final radiation treatment last week as well.  She is being seen today in symptom management clinic due to her recent hospitalization for intractable nausea vomiting and electrolyte imbalance.  Patient has had multiple hospitalizations for different reasons we will bring her in the clinic weekly to try to manage symptoms and decrease ED utilization.  Patient appears to be doing overall much better today she is smiling affect not as flat communicating better.  She reports that she is feeling a lot better denies any nausea during visit.  She reports that she has started using her antiemetics at home as she was not previously taking them as instructed.  She reports that her throat remains sore from radiation but overall she does feel better.  She reports that IV morphine  helped her pain better than the oxy.  Explained to her that we cannot do IV pain medications outpatient.  However since she is feeling better for her to get back on a routine schedule with taking her pain medication and antiemetic medication to help control her symptoms.  She verbalizes understanding.  Will bring her back in next week to make sure symptoms are well-managed and no adjustments are needed.  She denies any fever or chills, no shortness of breath, no new or worsening  pain, no dizziness, no fatigue, no recent nausea or vomiting.  She continues to have a lot of mucus production from radiation that she is able to expectorate.  She denies any symptoms of reflux this visit.  Today we will go ahead and proceed with IV fluid bolus as well as IV mag replacement.  Her hemoglobin remains low despite her last low-dose cisplatin  being on December 4.  Hemoglobin at 8 today.  She reports a history of iron deficiency anemia.  I went ahead and added on ferritin, iron and total iron TIBC, vitamin B12, folate levels to be drawn today.  She reports that she has never been able to tolerate oral iron in the past.  Due to all of her GI symptoms we will go ahead and proceed with IV iron if needed once panel result.  She verbalizes understanding.  REVIEW OF SYSTEMS:   Review of Systems  Constitutional:  Positive for malaise/fatigue. Negative for chills and fever.  HENT:  Negative for sore throat.   Respiratory: Negative.  Negative for cough, hemoptysis and shortness of breath.   Cardiovascular: Negative.  Negative for chest pain and leg swelling.  Gastrointestinal:  Negative for abdominal pain, constipation, nausea and vomiting.  Genitourinary: Negative.  Negative for dysuria.  Musculoskeletal: Negative.  Negative for back pain.  Skin: Negative.  Negative for rash.  Neurological:  Positive for weakness. Negative for dizziness, focal weakness and headaches.  Psychiatric/Behavioral:  The patient is not nervous/anxious.     As per HPI. Otherwise, a complete review of systems is negative.  PAST MEDICAL HISTORY: Past Medical History:  Diagnosis Date   Asthma  Cancer (HCC)    head and neck, getting radiation   GERD (gastroesophageal reflux disease)    HTN (hypertension)     PAST SURGICAL HISTORY: Past Surgical History:  Procedure Laterality Date   dental procedure     IR GASTROSTOMY TUBE MOD SED  07/05/2024   IR US  LIVER BIOPSY  05/17/2024    FAMILY HISTORY: Family  History  Problem Relation Age of Onset   Hypertension Maternal Grandmother    Diabetes Maternal Grandmother     ADVANCED DIRECTIVES (Y/N):  N  HEALTH MAINTENANCE: Social History   Tobacco Use   Smoking status: Former    Types: E-cigarettes   Smokeless tobacco: Never  Vaping Use   Vaping status: Former  Substance Use Topics   Alcohol use: Not Currently    Comment: social   Drug use: Yes    Types: Marijuana     Colonoscopy:  PAP:  Bone density:  Lipid panel:  Allergies  Allergen Reactions   Shellfish Allergy Anaphylaxis   Tomato Other (See Comments)    Flares up exema   Droperidol  Anxiety    Felt jittery and became tachycardic after droperidol     Current Outpatient Medications  Medication Sig Dispense Refill   ALPRAZolam  (XANAX ) 0.25 MG tablet Take 1 tablet (0.25 mg total) by mouth at bedtime as needed for anxiety. 30 tablet 0   amLODipine  (NORVASC ) 5 MG tablet Place 1 tablet (5 mg total) into feeding tube daily. 30 tablet 0   LORazepam  (ATIVAN ) 0.5 MG tablet Take 0.5 mg by mouth every 8 (eight) hours as needed for anxiety.     magnesium  oxide (MAG-OX) 400 MG tablet 1 tablet (400 mg total) per ng tube 2 (two) times daily for 10 days. 20 tablet 0   metoprolol  succinate (TOPROL -XL) 25 MG 24 hr tablet Take 1 tablet (25 mg total) by mouth daily. 30 tablet 0   Nutritional Supplements (ENSURE CLEAR) LIQD Take 1 Bottle by mouth 2 (two) times daily. 296 mL 6   omeprazole  (PRILOSEC) 20 MG capsule Take 1 capsule (20 mg total) by mouth daily. Open capsule pour into water  administer through tube flush with water  after administration 30 capsule 1   ondansetron  (ZOFRAN ) 4 MG tablet Place 2 tablets (8 mg total) into feeding tube every 8 (eight) hours as needed for nausea or vomiting. 30 tablet 0   oxycodone  (OXY-IR) 5 MG capsule Take 5 mg by mouth every 4 (four) hours as needed.     potassium chloride  20 MEQ/15ML (10%) SOLN Take 15 mLs (20 mEq total) by mouth daily. 473 mL 2    prochlorperazine  (COMPAZINE ) 10 MG tablet Take 1 tablet (10 mg total) by mouth every 8 (eight) hours as needed for nausea or vomiting. 30 tablet 0   SLYND  4 MG TABS Take 1 tablet by mouth daily.     Nutritional Supplements (FEEDING SUPPLEMENT, OSMOLITE 1.5 CAL,) LIQD Place 119 mLs into feeding tube 6 (six) times daily. (Patient not taking: Reported on 08/22/2024)     Water  For Irrigation, Sterile (FREE WATER ) SOLN Place 60 mLs into feeding tube 6 (six) times daily. (Patient not taking: Reported on 08/22/2024)     No current facility-administered medications for this visit.    OBJECTIVE: Vitals:   08/22/24 0931  BP: (!) 130/92  Pulse: 83  Resp: 18  Temp: 97.6 F (36.4 C)  SpO2: 100%       Body mass index is 35.12 kg/m.    ECOG FS:1 - Symptomatic but completely ambulatory  General: Well-developed, well-nourished, no acute distress. Eyes: Pink conjunctiva, anicteric sclera. HEENT: Normocephalic, moist mucous membranes. Lungs: No audible wheezing or coughing. Heart: Regular rate and rhythm. Abdomen: Soft, nontender, no obvious distention. Musculoskeletal: No edema, cyanosis, or clubbing. Neuro: Alert, answering all questions appropriately. Cranial nerves grossly intact. Skin: No rashes or petechiae noted. Psych: Normal affect.  LAB RESULTS:  Lab Results  Component Value Date   NA 138 08/22/2024   K 3.6 08/22/2024   CL 100 08/22/2024   CO2 27 08/22/2024   GLUCOSE 131 (H) 08/22/2024   BUN <5 (L) 08/22/2024   CREATININE 0.77 08/22/2024   CALCIUM 9.6 08/22/2024   PROT 6.4 (L) 08/22/2024   ALBUMIN 3.8 08/22/2024   AST 21 08/22/2024   ALT 33 08/22/2024   ALKPHOS 73 08/22/2024   BILITOT 0.2 08/22/2024   GFRNONAA >60 08/22/2024   GFRAA >60 12/05/2019    Lab Results  Component Value Date   WBC 2.3 (L) 08/22/2024   NEUTROABS 1.4 (L) 08/22/2024   HGB 8.0 (L) 08/22/2024   HCT 24.1 (L) 08/22/2024   MCV 98.8 08/22/2024   PLT 426 (H) 08/22/2024   Lab Results  Component  Value Date   MG 1.6 (L) 08/22/2024   MG 2.0 08/19/2024   MG 1.4 (L) 08/19/2024     STUDIES: ECHOCARDIOGRAM COMPLETE Result Date: 08/17/2024    ECHOCARDIOGRAM REPORT   Patient Name:   [facility] [place] Date of Exam: 08/16/2024 Medical Rec #:  [id number]    Height:       62.0 in Accession #:    [id number]   Weight:       195.1 lb Date of Birth:  September 29, 1995   BSA:          1.892 m Patient Age:    28 years     BP:           107/61 mmHg Patient Gender: F            HR:           112 bpm. Exam Location:  [facility] Procedure: 2D Echo, 3D Echo, Cardiac Doppler, Color Doppler and Strain Analysis            (Both Spectral and Color Flow Doppler were utilized during            procedure). Indications:     R07.9 Chest pain                  Z09 Chemo  History:         Patient has prior history of Echocardiogram examinations, most                  recent 03/02/2019. Risk Factors:Hypertension.  Sonographer:     Carl Coma RDCS Referring Phys:  [id number] CARALYN HUDSON Diagnosing Phys: Marsa Dooms MD IMPRESSIONS  1. Left ventricular ejection fraction, by estimation, is 55 to 60%. The left ventricle has normal function. The left ventricle has no regional wall motion abnormalities. Left ventricular diastolic parameters were normal. The average left ventricular global longitudinal strain is -21.4 %. The global longitudinal strain is normal.  2. Right ventricular systolic function is normal. The right ventricular size is normal.  3. The mitral valve is normal in structure. No evidence of mitral valve regurgitation. No evidence of mitral stenosis.  4. The aortic valve is normal in structure. Aortic valve regurgitation is not visualized. No aortic stenosis is present.  5. The inferior vena cava is normal in size  with greater than 50% respiratory variability, suggesting right atrial pressure of 3 mmHg. FINDINGS  Left Ventricle: Left ventricular ejection fraction, by estimation, is 55 to 60%. The left ventricle has normal  function. The left ventricle has no regional wall motion abnormalities. The average left ventricular global longitudinal strain is -21.4 %. Strain was performed and the global longitudinal strain is normal. The left ventricular internal cavity size was normal in size. There is no left ventricular hypertrophy. Left ventricular diastolic parameters were normal. Right Ventricle: The right ventricular size is normal. No increase in right ventricular wall thickness. Right ventricular systolic function is normal. Left Atrium: Left atrial size was normal in size. Right Atrium: Right atrial size was normal in size. Pericardium: There is no evidence of pericardial effusion. Mitral Valve: The mitral valve is normal in structure. No evidence of mitral valve regurgitation. No evidence of mitral valve stenosis. Tricuspid Valve: The tricuspid valve is normal in structure. Tricuspid valve regurgitation is not demonstrated. No evidence of tricuspid stenosis. Aortic Valve: The aortic valve is normal in structure. Aortic valve regurgitation is not visualized. No aortic stenosis is present. Pulmonic Valve: The pulmonic valve was normal in structure. Pulmonic valve regurgitation is not visualized. No evidence of pulmonic stenosis. Aorta: The aortic root is normal in size and structure. Venous: The inferior vena cava is normal in size with greater than 50% respiratory variability, suggesting right atrial pressure of 3 mmHg. IAS/Shunts: No atrial level shunt detected by color flow Doppler. Additional Comments: 3D was performed not requiring image post processing on an independent workstation and was normal.  LEFT VENTRICLE PLAX 2D LVIDd:         4.30 cm   Diastology LVIDs:         3.00 cm   LV e' medial:    9.82 cm/s LV PW:         1.00 cm   LV E/e' medial:  8.6 LV IVS:        1.00 cm   LV e' lateral:   14.60 cm/s LVOT diam:     1.80 cm   LV E/e' lateral: 5.8 LV SV:         37 LV SV Index:   19        2D Longitudinal Strain LVOT Area:      2.54 cm  2D Strain GLS (A4C):   -19.8 %                          2D Strain GLS (A3C):   -23.3 %                          2D Strain GLS (A2C):   -21.0 %                          2D Strain GLS Avg:     -21.4 %                           3D Volume EF:                          3D EF:        57 %  LV EDV:       141 ml                          LV ESV:       61 ml                          LV SV:        80 ml RIGHT VENTRICLE             IVC RV Basal diam:  3.10 cm     IVC diam: 1.60 cm RV S prime:     11.10 cm/s TAPSE (M-mode): 2.3 cm LEFT ATRIUM             Index        RIGHT ATRIUM          Index LA diam:        3.80 cm 2.01 cm/m   RA Area:     8.43 cm LA Vol (A2C):   34.1 ml 18.03 ml/m  RA Volume:   16.00 ml 8.46 ml/m LA Vol (A4C):   26.5 ml 14.01 ml/m LA Biplane Vol: 30.5 ml 16.12 ml/m  AORTIC VALVE LVOT Vmax:   105.10 cm/s LVOT Vmean:  69.500 cm/s LVOT VTI:    0.144 m  AORTA Ao Root diam: 3.00 cm MITRAL VALVE MV Area (PHT): 5.72 cm    SHUNTS MV Decel Time: 133 msec    Systemic VTI:  0.14 m MV E velocity: 84.20 cm/s  Systemic Diam: 1.80 cm MV A velocity: 83.50 cm/s MV E/A ratio:  1.01 Marsa Dooms MD Electronically signed by Marsa Dooms MD Signature Date/Time: 08/17/2024/12:53:30 PM    Final    DG Shoulder Left Result Date: 08/05/2024 EXAM: 1 VIEW(S) XRAY OF THE LEFT SHOULDER 08/05/2024 01:58:00 AM COMPARISON: None available. CLINICAL HISTORY: fall FINDINGS: BONES AND JOINTS: Glenohumeral joint is normally aligned. No acute fracture. No malalignment. The [facility] joint is unremarkable. SOFT TISSUES: No abnormal calcifications. Visualized lung is unremarkable. IMPRESSION: 1. No acute fracture or dislocation. Electronically signed by: Morgane Naveau MD 08/05/2024 02:05 AM EST RP Workstation: [id number]   DG Chest 2 View Result Date: 08/05/2024 EXAM: 2 VIEW(S) XRAY OF THE CHEST 08/05/2024 01:58:00 AM COMPARISON: 07/24/2024 CLINICAL HISTORY: cp FINDINGS: LUNGS AND PLEURA: No  focal pulmonary opacity. No pleural effusion. No pneumothorax. HEART AND MEDIASTINUM: No acute abnormality of the cardiac and mediastinal silhouettes. BONES AND SOFT TISSUES: No acute osseous abnormality. IMPRESSION: 1. No acute cardiopulmonary process. Electronically signed by: Morgane Naveau MD 08/05/2024 02:05 AM EST RP Workstation: [id number]    ASSESSMENT: Stage III EBV positive nasopharyngeal carcinoma.  PLAN:    Nausea/vomiting/Reflux/Dehydration: Improved since hospitalization proceed with IVF bolus today and closely monitor weekly Hypokalemia: Today k at 3.6 will continue to monitor instructed her to continue to take potassium supplement at home Hypomagnesia: Today at 1.6 proceed with IV Mg replacement continue mag supplement at home Anxiety/depression:  Patient in high stress situation with her health.  Very anxious at times, flat affect.  Continues to work with our clinical child psychotherapist.  Continue with PRN xanax  at home Anemia: unknown source, patient reports a history of IDA in past.  Added Ferritin, Iron and TIBC, folate, and Vitamin B12 to blood work today all pending.  Unable to tolerate PO iron in past per patient could consider IV iron if stores are low.  Patient in agreement with plan.  Patient expressed understanding and was in agreement with this plan. She also understands that She can call clinic at any time with any questions, concerns, or complaints.  Follow-up plan:  Proceed with IV fluids and IV mag today F/U in 1 week repeat cbc, cmp, mg possible IVF bolus/mag/venofer LP     Cancer Staging  Nasopharyngeal carcinoma (HCC) Staging form: Pharynx - Nasopharynx, AJCC V9 - Clinical stage from 05/30/2024: Stage III (cTX, cN3, cM0) - Signed by [name], MD on 05/30/2024 Stage prefix: Initial diagnosis Method of lymph node assessment: Clinical   Morna Husband, NP   08/23/2024 9:30 AM     "

## 2024-08-25 LAB — CULTURE, BLOOD (ROUTINE X 2)
Culture: NO GROWTH
Culture: NO GROWTH

## 2024-08-28 ENCOUNTER — Other Ambulatory Visit: Payer: Self-pay | Admitting: *Deleted

## 2024-08-28 MED ORDER — ONDANSETRON HCL 4 MG PO TABS: 8.0000 mg | ORAL_TABLET | Freq: Three times a day (TID) | ORAL | 0 refills | Status: AC | PRN

## 2024-08-29 ENCOUNTER — Encounter: Payer: Self-pay | Admitting: *Deleted

## 2024-08-29 ENCOUNTER — Telehealth: Payer: Self-pay | Admitting: *Deleted

## 2024-08-29 ENCOUNTER — Encounter: Payer: Self-pay | Admitting: Oncology

## 2024-08-29 NOTE — Patient Outreach (Addendum)
 Complex Care Management   Visit Note  08/29/2024  Name:  Brandi [name] MRN: [id number] DOB: 04/14/1996  Situation: RNCM spoke briefly with pt today and inquired if today was a convenient time to talk. Pt indicated she was not feeling well and requested to reschedule today's appointment. RN offered to assistance further with interventions however pt opt to decline at this time. RNCM discussed her recent BP in EPIC 130/92 on 08/22/2025 along with the risk levels of possible stroke or cardiovascular events. Pt states these readings are her normal. RNCM strongly encouraged pt to continue to check her blood pressure levels and report any abnormal readings and seek medical assistance with any precipitating symptoms (pt verbalized an understanding),   Pt requested an afternoon appointment for rescheduling and she will check her MyChart for this appointment.   Appointment scheduled : 09/05/2024 @ 1:30 pm   Olam Ku, RN, BSN [facility], [facility] RN Care Manager Direct Dial: [phone]  Fax: [phone]

## 2024-08-30 ENCOUNTER — Inpatient Hospital Stay

## 2024-08-30 ENCOUNTER — Encounter: Payer: Self-pay | Admitting: Nurse Practitioner

## 2024-08-30 ENCOUNTER — Inpatient Hospital Stay: Admitting: Nurse Practitioner

## 2024-08-30 ENCOUNTER — Telehealth: Payer: Self-pay | Admitting: *Deleted

## 2024-08-30 VITALS — BP 132/86 | HR 86 | Temp 98.0°F | Resp 16 | Wt 186.8 lb

## 2024-08-30 DIAGNOSIS — R110 Nausea: Secondary | ICD-10-CM

## 2024-08-30 DIAGNOSIS — D649 Anemia, unspecified: Secondary | ICD-10-CM

## 2024-08-30 DIAGNOSIS — C119 Malignant neoplasm of nasopharynx, unspecified: Secondary | ICD-10-CM | POA: Diagnosis not present

## 2024-08-30 DIAGNOSIS — E860 Dehydration: Secondary | ICD-10-CM

## 2024-08-30 DIAGNOSIS — R070 Pain in throat: Secondary | ICD-10-CM | POA: Diagnosis not present

## 2024-08-30 DIAGNOSIS — Z510 Encounter for antineoplastic radiation therapy: Secondary | ICD-10-CM | POA: Diagnosis not present

## 2024-08-30 LAB — BASIC METABOLIC PANEL - CANCER CENTER ONLY
Anion gap: 12 (ref 5–15)
BUN: 12 mg/dL (ref 6–20)
CO2: 26 mmol/L (ref 22–32)
Calcium: 9.8 mg/dL (ref 8.9–10.3)
Chloride: 98 mmol/L (ref 98–111)
Creatinine: 0.9 mg/dL (ref 0.44–1.00)
GFR, Estimated: >60 mL/min
Glucose, Bld: 98 mg/dL (ref 70–99)
Potassium: 4.6 mmol/L (ref 3.5–5.1)
Sodium: 136 mmol/L (ref 135–145)

## 2024-08-30 LAB — CBC WITH DIFFERENTIAL/PLATELET
Abs Immature Granulocytes: 0.12 K/uL — ABNORMAL HIGH (ref 0.00–0.07)
Basophils Absolute: 0 K/uL (ref 0.0–0.1)
Basophils Relative: 0 %
Eosinophils Absolute: 0 K/uL (ref 0.0–0.5)
Eosinophils Relative: 0 %
HCT: 27.7 % — ABNORMAL LOW (ref 36.0–46.0)
Hemoglobin: 9 g/dL — ABNORMAL LOW (ref 12.0–15.0)
Immature Granulocytes: 2 %
Lymphocytes Relative: 7 %
Lymphs Abs: 0.4 K/uL — ABNORMAL LOW (ref 0.7–4.0)
MCH: 32.5 pg (ref 26.0–34.0)
MCHC: 32.5 g/dL (ref 30.0–36.0)
MCV: 100 fL (ref 80.0–100.0)
Monocytes Absolute: 1 K/uL (ref 0.1–1.0)
Monocytes Relative: 18 %
Neutro Abs: 4 K/uL (ref 1.7–7.7)
Neutrophils Relative %: 73 %
Platelets: 551 K/uL — ABNORMAL HIGH (ref 150–400)
RBC: 2.77 MIL/uL — ABNORMAL LOW (ref 3.87–5.11)
RDW: 19.2 % — ABNORMAL HIGH (ref 11.5–15.5)
WBC: 5.5 K/uL (ref 4.0–10.5)
nRBC: 0.4 % — ABNORMAL HIGH (ref 0.0–0.2)

## 2024-08-30 LAB — TSH: TSH: 1.21 u[IU]/mL (ref 0.350–4.500)

## 2024-08-30 LAB — MAGNESIUM: Magnesium: 1.9 mg/dL (ref 1.7–2.4)

## 2024-08-30 MED ORDER — ENSURE PLUS PO LIQD: ORAL | Status: AC

## 2024-08-30 MED ORDER — OXYCODONE HCL 5 MG PO TABS: 5.0000 mg | ORAL_TABLET | ORAL | 0 refills | Status: AC | PRN

## 2024-08-30 NOTE — Telephone Encounter (Signed)
 Patient established with Dr Milissa at [facility], first available appointment given for 09/26/24 at 2pm.  Pt notified of appointment.

## 2024-08-30 NOTE — Progress Notes (Signed)
 No IVF today

## 2024-08-30 NOTE — Progress Notes (Signed)
 Nutrition Follow-up:  Patient with stage III EBV positive nasopharyngeal carcinoma.  Completed concurrent chemotherapy (cisplatin  on 12/4) and radiation (12/12).  Multiple hospital admissions during treatment for nausea and vomiting.  Met with patient in clinic. Patient has previously been on osmolite 1.5 and [employer] 1.4 (samples given) tube feeding but reports that these tend to upset her stomach, increase nausea and vomiting after giving tube feeding.   She requested at last visit to try ensure via feeding tube.  She reports that she tried the ensure via tube and was able to tolerate it better than osmolite or [employer].  Less nausea, stomach upset with ensure.  She has been taking 3 mostly 4 cartons of ensure via feeding tube.  Reports only has nausea when yawns.  Still spitting up mucus during visit.  Reports bowel movement every other day. Not currently taking anything to promote bowel movement but has lactulose  on hand.  Was able to try some soup by mouth yesterday but noodles tasted bad and was only able to drink the broth.  Also tried some dry cereal.  Drinking water  (2 1/2, 16 oz bottles and gatorade (1 bottle) orally.  Overall intake poor and requiring tube feeding at this time to meet nutritional needs   Medications: compazine , KCL, Mag, omeprazole   Labs: reviewed  Anthropometrics:   Weight 186 lb today 192 lb on 12/23 187 lb 14.4 oz on 12/9 192 lb 9.6 oz on 12/4 195 lb on 11/26 207 lb on 11/9 204 lb on 10/29 210 lb on 10/22 235 lb on 02/29/24  15% weight loss in the last 2 months, significant   Estimated Energy Needs  Kcals: 2000-2300 Protein: 95-114 g Fluid: >  NUTRITION DIAGNOSIS: Inadequate oral intake relying on feeding tube   MALNUTRITION DIAGNOSIS: severe malnutrition   INTERVENTION:  Recommend switching from osmolite 1.5 to ensure plus, 6 cartons per day (1 carton via bolus feeding 6 times a day). Flush with 60ml of water  before and after each  feeding. Give additional water  flush of 240ml BID or drink orally for additional hydration Provides 2100 calories, 78 g protein, free water  ( total) Will send new prescription and request to change tube feeding to [facility].  Discussed starting to try soft, moist foods orally (applesauce, scrambled eggs, soups, yogurt) Patient has contact information    MONITORING, EVALUATION, GOAL: weight trends, tube feeding   NEXT VISIT: Wednesday, Jan 7 after NP visit  Shakema Surita B. Dasie SOLON, CSO, LDN Registered Dietitian [phone]

## 2024-08-30 NOTE — Progress Notes (Unsigned)
 " [facility]  [phone] [phone]  ID: Brandi [name] OB: 30-Jul-1996  MR#: [id number]  RDW#:[id number]  Patient Care Team: [name], MD as PCP - General (Internal Medicine) [name], MD as Consulting Physician (Radiation Oncology) [name], MD as Consulting Physician (Oncology) Alvia Olam BIRCH, RN as VBCI Care Management  CHIEF COMPLAINT: Stage III EBV positive nasopharyngeal carcinoma.  INTERVAL HISTORY: Patient presents today to symptom management clinic for weekly visit due to ongoing history of anemia, hypomagnesia, N/V, and dehydration.  Patient overall improved today.  She denies any nausea/vomiting this week.  Today she is smiling and communicating better.  She reports that the only pain she feels now is in her throat when yawning.  I plan to refer to ENT regarding this.  All lab work reviewed and stable today.  She completed low dose cisplatin  and radiation the week of Dec 4th.  She does remain anemic but it is slowing improving.  Last week I checked her Iron stores, vitamin b12, and folate levels all were adequate.  Will continue to monitor Hg.  Mg WNL today for now we will continue with weekly visits for blood work and close monitoring.  REVIEW OF SYSTEMS:   Review of Systems  Constitutional:  Negative for chills, fever and malaise/fatigue.  HENT:  Negative for sore throat.   Respiratory: Negative.  Negative for cough, hemoptysis and shortness of breath.   Cardiovascular: Negative.  Negative for chest pain and leg swelling.  Gastrointestinal:  Negative for abdominal pain, constipation, nausea and vomiting.  Genitourinary: Negative.  Negative for dysuria.  Musculoskeletal: Negative.  Negative for back pain.  Skin: Negative.  Negative for rash.  Neurological:  Negative for dizziness, focal weakness, weakness and headaches.  Psychiatric/Behavioral:  The patient is not nervous/anxious.     As per HPI. Otherwise,  a complete review of systems is negative.  PAST MEDICAL HISTORY: Past Medical History:  Diagnosis Date   Asthma    Cancer (HCC)    head and neck, getting radiation   GERD (gastroesophageal reflux disease)    HTN (hypertension)     PAST SURGICAL HISTORY: Past Surgical History:  Procedure Laterality Date   dental procedure     IR GASTROSTOMY TUBE MOD SED  07/05/2024   IR US  LIVER BIOPSY  05/17/2024    FAMILY HISTORY: Family History  Problem Relation Age of Onset   Hypertension Maternal Grandmother    Diabetes Maternal Grandmother     ADVANCED DIRECTIVES (Y/N):  N  HEALTH MAINTENANCE: Social History   Tobacco Use   Smoking status: Former    Types: E-cigarettes   Smokeless tobacco: Never  Vaping Use   Vaping status: Former  Substance Use Topics   Alcohol use: Not Currently    Comment: social   Drug use: Yes    Types: Marijuana     Colonoscopy:  PAP:  Bone density:  Lipid panel:  Allergies  Allergen Reactions   Shellfish Allergy Anaphylaxis   Tomato Other (See Comments)    Flares up exema   Droperidol  Anxiety    Felt jittery and became tachycardic after droperidol     Current Outpatient Medications  Medication Sig Dispense Refill   ALPRAZolam  (XANAX ) 0.25 MG tablet Take 1 tablet (0.25 mg total) by mouth at bedtime as needed for anxiety. 30 tablet 0   amLODipine  (NORVASC ) 5 MG tablet Place 1 tablet (5 mg total) into feeding tube daily. 30 tablet 0   metoprolol  succinate (TOPROL -XL) 25 MG 24  hr tablet Take 1 tablet (25 mg total) by mouth daily. 30 tablet 0   Nutritional Supplements (ENSURE CLEAR) LIQD Take 1 Bottle by mouth 2 (two) times daily. 296 mL 6   omeprazole  (PRILOSEC) 20 MG capsule Take 1 capsule (20 mg total) by mouth daily. Open capsule pour into water  administer through tube flush with water  after administration 30 capsule 1   ondansetron  (ZOFRAN ) 4 MG tablet Place 2 tablets (8 mg total) into feeding tube every 8 (eight)  hours as needed for nausea or vomiting. 30 tablet 0   oxyCODONE  (OXY IR/ROXICODONE ) 5 MG immediate release tablet Take 1 tablet (5 mg total) by mouth every 4 (four) hours as needed for severe pain (pain score 7-10). 30 tablet 0   oxycodone  (OXY-IR) 5 MG capsule Take 5 mg by mouth every 4 (four) hours as needed.     potassium chloride  20 MEQ/15ML (10%) SOLN Take 15 mLs (20 mEq total) by mouth daily. 473 mL 2   prochlorperazine  (COMPAZINE ) 10 MG tablet Take 1 tablet (10 mg total) by mouth every 8 (eight) hours as needed for nausea or vomiting. 30 tablet 0   SLYND  4 MG TABS Take 1 tablet by mouth daily.     Ensure Plus (ENSURE PLUS) LIQD Give 1 carton bolus via feeding tube 6 times a day.  Flush with 60ml of water  before and after each feeding.  Give additional 240 ml of water  BID for additional hydration Meets 100% of needs     LORazepam  (ATIVAN ) 0.5 MG tablet Take 0.5 mg by mouth every 8 (eight) hours as needed for anxiety.     Water  For Irrigation, Sterile (FREE WATER ) SOLN Place 60 mLs into feeding tube 6 (six) times daily. (Patient not taking: Reported on 08/30/2024)     No current facility-administered medications for this visit.    OBJECTIVE: Vitals:   08/30/24 1015  BP: 132/86  Pulse: 86  Resp: 16  Temp: 98 F (36.7 C)  SpO2: 100%       Body mass index is 34.17 kg/m.    ECOG FS:1 - Symptomatic but completely ambulatory  General: Well-developed, well-nourished, no acute distress. Eyes: Pink conjunctiva, anicteric sclera. HEENT: Normocephalic, moist mucous membranes. Lungs: No audible wheezing or coughing. Heart: Regular rate and rhythm. Abdomen: Soft, nontender, no obvious distention. Musculoskeletal: No edema, cyanosis, or clubbing. Neuro: Alert, answering all questions appropriately. Cranial nerves grossly intact. Skin: No rashes or petechiae noted. Psych: Normal affect.  LAB RESULTS:  Lab Results  Component Value Date   NA 136 08/30/2024   K 4.6 08/30/2024    CL 98 08/30/2024   CO2 26 08/30/2024   GLUCOSE 98 08/30/2024   BUN 12 08/30/2024   CREATININE 0.90 08/30/2024   CALCIUM 9.8 08/30/2024   PROT 6.4 (L) 08/22/2024   ALBUMIN 3.8 08/22/2024   AST 21 08/22/2024   ALT 33 08/22/2024   ALKPHOS 73 08/22/2024   BILITOT 0.2 08/22/2024   GFRNONAA >60 08/30/2024   GFRAA >60 12/05/2019          Latest Ref Rng & Units 08/30/2024    9:36 AM 08/22/2024    9:08 AM 08/19/2024   11:02 PM  CBC EXTENDED  WBC 4.0 - 10.5 K/uL 5.5  2.3  1.4   RBC 3.87 - 5.11 MIL/uL 2.77  2.44  2.36   Hemoglobin 12.0 - 15.0 g/dL 9.0  8.0  7.7   HCT 63.9 - 46.0 % 27.7  24.1  23.2   Platelets 150 -  400 K/uL 551  426  309   NEUT# 1.7 - 7.7 K/uL 4.0  1.4  0.9   Lymph# 0.7 - 4.0 K/uL 0.4  0.3  0.2     Lab Results  Component Value Date   MG 1.9 08/30/2024   MG 1.6 (L) 08/22/2024   MG 2.0 08/19/2024   Lab Results  Component Value Date   IRON 61 08/22/2024   TIBC 260 08/22/2024   FERRITIN 301 08/22/2024    Lab Results  Component Value Date   VITAMINB12 1,745 (H) 08/22/2024    Lab Results  Component Value Date   FOLATE 6.1 08/22/2024      STUDIES: ECHOCARDIOGRAM COMPLETE Result Date: 08/17/2024    ECHOCARDIOGRAM REPORT   Patient Name:   [facility] Date of Exam: 08/16/2024 Medical Rec #:  [id number]    Height:       62.0 in Accession #:    [id number]   Weight:       195.1 lb Date of Birth:  [birth year]   BSA:          1.892 m Patient Age:    28 years     BP:           107/61 mmHg Patient Gender: F            HR:           112 bpm. Exam Location:  [facility] Procedure: 2D Echo, 3D Echo, Cardiac Doppler, Color Doppler and Strain Analysis            (Both Spectral and Color Flow Doppler were utilized during            procedure). Indications:     R07.9 Chest pain                  Z09 Chemo  History:         Patient has prior history of Echocardiogram examinations, most                  recent 03/02/2019. Risk Factors:Hypertension.  Sonographer:     Carl Coma RDCS Referring Phys:  [id number] CARALYN HUDSON Diagnosing Phys: Marsa Dooms MD IMPRESSIONS  1. Left ventricular ejection fraction, by estimation, is 55 to 60%. The left ventricle has normal function. The left ventricle has no regional wall motion abnormalities. Left ventricular diastolic parameters were normal. The average left ventricular global longitudinal strain is -21.4 %. The global longitudinal strain is normal.  2. Right ventricular systolic function is normal. The right ventricular size is normal.  3. The mitral valve is normal in structure. No evidence of mitral valve regurgitation. No evidence of mitral stenosis.  4. The aortic valve is normal in structure. Aortic valve regurgitation is not visualized. No aortic stenosis is present.  5. The inferior vena cava is normal in size with greater than 50% respiratory variability, suggesting right atrial pressure of 3 mmHg. FINDINGS  Left Ventricle: Left ventricular ejection fraction, by estimation, is 55 to 60%. The left ventricle has normal function. The left ventricle has no regional wall motion abnormalities. The average left ventricular global longitudinal strain is -21.4 %. Strain was performed and the global longitudinal strain is normal. The left ventricular internal cavity size was normal in size. There is no left ventricular hypertrophy. Left ventricular diastolic parameters were normal. Right Ventricle: The right ventricular size is normal. No increase in right ventricular wall thickness. Right ventricular systolic function is normal. Left Atrium: Left atrial size  was normal in size. Right Atrium: Right atrial size was normal in size. Pericardium: There is no evidence of pericardial effusion. Mitral Valve: The mitral valve is normal in structure. No evidence of mitral valve regurgitation. No evidence of mitral valve stenosis. Tricuspid Valve: The tricuspid valve is normal in structure. Tricuspid valve regurgitation is not  demonstrated. No evidence of tricuspid stenosis. Aortic Valve: The aortic valve is normal in structure. Aortic valve regurgitation is not visualized. No aortic stenosis is present. Pulmonic Valve: The pulmonic valve was normal in structure. Pulmonic valve regurgitation is not visualized. No evidence of pulmonic stenosis. Aorta: The aortic root is normal in size and structure. Venous: The inferior vena cava is normal in size with greater than 50% respiratory variability, suggesting right atrial pressure of 3 mmHg. IAS/Shunts: No atrial level shunt detected by color flow Doppler. Additional Comments: 3D was performed not requiring image post processing on an independent workstation and was normal.  LEFT VENTRICLE PLAX 2D LVIDd:         4.30 cm   Diastology LVIDs:         3.00 cm   LV e' medial:    9.82 cm/s LV PW:         1.00 cm   LV E/e' medial:  8.6 LV IVS:        1.00 cm   LV e' lateral:   14.60 cm/s LVOT diam:     1.80 cm   LV E/e' lateral: 5.8 LV SV:         37 LV SV Index:   19        2D Longitudinal Strain LVOT Area:     2.54 cm  2D Strain GLS (A4C):   -19.8 %                          2D Strain GLS (A3C):   -23.3 %                          2D Strain GLS (A2C):   -21.0 %                          2D Strain GLS Avg:     -21.4 %                           3D Volume EF:                          3D EF:        57 %                          LV EDV:       141 ml                          LV ESV:       61 ml                          LV SV:        80 ml RIGHT VENTRICLE             IVC RV Basal diam:  3.10 cm     IVC diam: 1.60 cm  RV S prime:     11.10 cm/s TAPSE (M-mode): 2.3 cm LEFT ATRIUM             Index        RIGHT ATRIUM          Index LA diam:        3.80 cm 2.01 cm/m   RA Area:     8.43 cm LA Vol (A2C):   34.1 ml 18.03 ml/m  RA Volume:   16.00 ml 8.46 ml/m LA Vol (A4C):   26.5 ml 14.01 ml/m LA Biplane Vol: 30.5 ml 16.12 ml/m  AORTIC VALVE LVOT Vmax:   105.10 cm/s LVOT Vmean:  69.500 cm/s LVOT VTI:    0.144  m  AORTA Ao Root diam: 3.00 cm MITRAL VALVE MV Area (PHT): 5.72 cm    SHUNTS MV Decel Time: 133 msec    Systemic VTI:  0.14 m MV E velocity: 84.20 cm/s  Systemic Diam: 1.80 cm MV A velocity: 83.50 cm/s MV E/A ratio:  1.01 Marsa Dooms MD Electronically signed by Marsa Dooms MD Signature Date/Time: 08/17/2024/12:53:30 PM    Final    DG Shoulder Left Result Date: 08/05/2024 EXAM: 1 VIEW(S) XRAY OF THE LEFT SHOULDER 08/05/2024 01:58:00 AM COMPARISON: None available. CLINICAL HISTORY: fall FINDINGS: BONES AND JOINTS: Glenohumeral joint is normally aligned. No acute fracture. No malalignment. The [facility] joint is unremarkable. SOFT TISSUES: No abnormal calcifications. Visualized lung is unremarkable. IMPRESSION: 1. No acute fracture or dislocation. Electronically signed by: Morgane Naveau MD 08/05/2024 02:05 AM EST RP Workstation: [id number]   DG Chest 2 View Result Date: 08/05/2024 EXAM: 2 VIEW(S) XRAY OF THE CHEST 08/05/2024 01:58:00 AM COMPARISON: 07/24/2024 CLINICAL HISTORY: cp FINDINGS: LUNGS AND PLEURA: No focal pulmonary opacity. No pleural effusion. No pneumothorax. HEART AND MEDIASTINUM: No acute abnormality of the cardiac and mediastinal silhouettes. BONES AND SOFT TISSUES: No acute osseous abnormality. IMPRESSION: 1. No acute cardiopulmonary process. Electronically signed by: Morgane Naveau MD 08/05/2024 02:05 AM EST RP Workstation: [id number]    ASSESSMENT: Stage III EBV positive nasopharyngeal carcinoma.  PLAN:    Nausea/vomiting/Reflux/Dehydration: Resolved per patient reports no need for IVF bolus this week continue to monitor Hypokalemia: Today k at 4.6 will continue to monitor instructed her to continue to take potassium supplement at home Hypomagnesia: Today at 1.9 instructed her to continue to take magnesium  at home will continue to monitor weekly no need for magnesium  replacement today Anxiety/depression:  Patient in high stress situation with her health.  Very anxious at  times, flat affect but seems to be improving since feeling better post treatment  Continues to work with our clinical child psychotherapist.  Continue with PRN xanax  at home Anemia: unknown source, patient reports a history of IDA in past.  Improved this visit with Hg at 9.0.  Checked Iron panel, vitamin b12, and folate last week all were stable does not indicate iron deficiency at this time will continue to monitor.  Could slowly improve post chemo will continue to closely monitor discussed with Dr. Jacobo.  Throat Pain:  Reporting ongoing throat pain when yawning only will refer to ENT she has been able to swallow good and she has starting having some PO intake.  She met with Diego RD today and discussed     Patient expressed understanding and was in agreement with this plan. She also understands that She can call clinic at any time with any questions, concerns, or complaints.  Follow-up plan:  F/u with ENT for continued pain  in throat while yawning Refilled oxycodone  F/u see np with labs cbc with diff, cmp, mg in 1 week LP       Cancer Staging  Nasopharyngeal carcinoma (HCC) Staging form: Pharynx - Nasopharynx, AJCC V9 - Clinical stage from 05/30/2024: Stage III (cTX, cN3, cM0) - Signed by [name], MD on 05/30/2024 Stage prefix: Initial diagnosis Method of lymph node assessment: Clinical   Morna Husband, NP   09/03/2024 3:01 PM     "

## 2024-08-30 NOTE — Progress Notes (Signed)
 Pt in for follow up, reports still having some nausea but not as bad as last visit.  Pt reports pain in throat, worse when she yawns.  Pt states she is eating and drinking ensure daily.  Pt states she has not picked up morphine  yet.

## 2024-09-04 ENCOUNTER — Encounter: Payer: Self-pay | Admitting: Oncology

## 2024-09-05 ENCOUNTER — Telehealth: Payer: Self-pay | Admitting: *Deleted

## 2024-09-05 ENCOUNTER — Encounter: Payer: Self-pay | Admitting: *Deleted

## 2024-09-05 NOTE — Patient Instructions (Signed)
 Brandi [name] - I am sorry I was unable to reach you today for our scheduled appointment. I work with [name], MD and am calling to support your healthcare needs. Please contact me at [phone] at your earliest convenience. I look forward to speaking with you soon.   Thank you,   Olam Ku, RN, BSN [place]  [facility], [facility] RN Care Manager Direct Dial: [phone]  Fax: [phone]

## 2024-09-06 ENCOUNTER — Inpatient Hospital Stay

## 2024-09-06 ENCOUNTER — Inpatient Hospital Stay: Admitting: Nurse Practitioner

## 2024-09-07 ENCOUNTER — Encounter: Payer: Self-pay | Admitting: Nurse Practitioner

## 2024-09-07 ENCOUNTER — Inpatient Hospital Stay: Admitting: Nurse Practitioner

## 2024-09-07 ENCOUNTER — Inpatient Hospital Stay

## 2024-09-07 ENCOUNTER — Inpatient Hospital Stay: Attending: Radiation Oncology

## 2024-09-07 VITALS — BP 119/87 | HR 102 | Temp 97.2°F | Resp 18 | Wt 186.0 lb

## 2024-09-07 DIAGNOSIS — C119 Malignant neoplasm of nasopharynx, unspecified: Secondary | ICD-10-CM

## 2024-09-07 DIAGNOSIS — E860 Dehydration: Secondary | ICD-10-CM

## 2024-09-07 DIAGNOSIS — F419 Anxiety disorder, unspecified: Secondary | ICD-10-CM | POA: Diagnosis not present

## 2024-09-07 DIAGNOSIS — D649 Anemia, unspecified: Secondary | ICD-10-CM

## 2024-09-07 DIAGNOSIS — T85848A Pain due to other internal prosthetic devices, implants and grafts, initial encounter: Secondary | ICD-10-CM

## 2024-09-07 DIAGNOSIS — R070 Pain in throat: Secondary | ICD-10-CM | POA: Diagnosis not present

## 2024-09-07 LAB — CBC WITH DIFFERENTIAL (CANCER CENTER ONLY)
Abs Immature Granulocytes: 0.07 K/uL (ref 0.00–0.07)
Basophils Absolute: 0 K/uL (ref 0.0–0.1)
Basophils Relative: 0 %
Eosinophils Absolute: 0.2 K/uL (ref 0.0–0.5)
Eosinophils Relative: 2 %
HCT: 30.1 % — ABNORMAL LOW (ref 36.0–46.0)
Hemoglobin: 9.7 g/dL — ABNORMAL LOW (ref 12.0–15.0)
Immature Granulocytes: 1 %
Lymphocytes Relative: 6 %
Lymphs Abs: 0.4 K/uL — ABNORMAL LOW (ref 0.7–4.0)
MCH: 32.4 pg (ref 26.0–34.0)
MCHC: 32.2 g/dL (ref 30.0–36.0)
MCV: 100.7 fL — ABNORMAL HIGH (ref 80.0–100.0)
Monocytes Absolute: 0.8 K/uL (ref 0.1–1.0)
Monocytes Relative: 10 %
Neutro Abs: 6 K/uL (ref 1.7–7.7)
Neutrophils Relative %: 81 %
Platelet Count: 388 K/uL (ref 150–400)
RBC: 2.99 MIL/uL — ABNORMAL LOW (ref 3.87–5.11)
RDW: 17.7 % — ABNORMAL HIGH (ref 11.5–15.5)
WBC Count: 7.4 K/uL (ref 4.0–10.5)
nRBC: 0 % (ref 0.0–0.2)

## 2024-09-07 LAB — CMP (CANCER CENTER ONLY)
ALT: 32 U/L (ref 0–44)
AST: 27 U/L (ref 15–41)
Albumin: 4 g/dL (ref 3.5–5.0)
Alkaline Phosphatase: 71 U/L (ref 38–126)
Anion gap: 12 (ref 5–15)
BUN: 10 mg/dL (ref 6–20)
CO2: 25 mmol/L (ref 22–32)
Calcium: 10.3 mg/dL (ref 8.9–10.3)
Chloride: 101 mmol/L (ref 98–111)
Creatinine: 0.95 mg/dL (ref 0.44–1.00)
GFR, Estimated: >60 mL/min
Glucose, Bld: 111 mg/dL — ABNORMAL HIGH (ref 70–99)
Potassium: 4.6 mmol/L (ref 3.5–5.1)
Sodium: 138 mmol/L (ref 135–145)
Total Bilirubin: 0.2 mg/dL (ref 0.0–1.2)
Total Protein: 7 g/dL (ref 6.5–8.1)

## 2024-09-07 LAB — MAGNESIUM: Magnesium: 1.8 mg/dL (ref 1.7–2.4)

## 2024-09-07 NOTE — Progress Notes (Signed)
 Nutrition Follow-up:  Patient with stage III EBV positive nasopharyngeal carcinoma.  Completed concurrent chemotherapy (cisplatin  on 12/4) and radiation (12/12).  Multiple hospital admissions during treatment for nausea and vomiting.    Met with patient in clinic.  Patient smiling when entering room.  Feeling better.  Reports that she is tolerating 6 cartons of ensure plus via feeding tube.  Those cartons seem smaller.  Flushing with of water  before and after each feeding.  Taste is still off.  Tried chicken noodle soup but is was greasy and did not taste good.  Also tried applesauce.  Wants to try oatmeal.  Drinking gatorade by mouth.  Reports bowel movement before coming today.  Reports pain around PEG tube.  Feels sore when moves or turns.    PEG site observed- no signs of infection, redness.  Tube hanging down and she is tucking end of tube in pants.      Medications: reviewed  Labs: glucose 111  Anthropometrics:   Weight 186 lb today  186 lb on 12/31 192 lb on 12/23 187 lb 14. 4 oz on 12/9 192 lb 9.6 oz on 12/4 195 lb on 11/26 207 lb on 11/9 204 lb on 10/29 210 lb on 10/22 235 lb on 02/29/24   Estimated Energy Needs  Kcals: 2000-2300 Protein: 95-114 g Fluid: > 2000 ml  NUTRITION DIAGNOSIS: Inadequate oral intake continue relying on feeding tube   MALNUTRITION DIAGNOSIS:  severe malnutrition   INTERVENTION:  Continue ensure plus, 6 cartons per day via tube.  Flush with of water  before and after each feeding.   Patient planning to try oatmeal and other soft foods by mouth (yogurt) Encouraged patient to use binder to hold tubing up and not allow to pull down.  Can place gauze under bumper to relive pressure.  If no improvement in pain or worsens can consider abdominal xray. Discussed with NP   MONITORING, EVALUATION, GOAL: weight trends, tube feeding, intake   NEXT VISIT: Friday, Jan 23   Marquise Wicke B. Dasie SOLON, CSO, LDN Registered Dietitian [phone]

## 2024-09-08 ENCOUNTER — Encounter: Payer: Self-pay | Admitting: Oncology

## 2024-09-08 NOTE — Progress Notes (Signed)
 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  INTERVAL HISTORY: Patient presents today to symptom management clinic for weekly visit due to ongoing history of anemia, hypomagnesia, N/V, and dehydration.  Patient smiling and doing well today.  She denies any nausea/vomiting this week.  Today she is smiling and communicating better.  She reports that the only pain she feels now is in her throat when yawning however this is improving as well, she does have an ENT appointment to address later this month.  All lab work reviewed and stable today no need for iv fluids or mag.  She completed low dose cisplatin  and radiation the week of Dec 4th.  She does remain anemic but it is slowing improving.  Last week I checked her Iron stores, vitamin b12, and folate levels all were adequate.  Will continue to monitor Hg.  Mg WNL today now for the second week in a row will see her in 2 weeks she is in agreement with this plan.  She is also see Joli RD each time as well and continues to work on advancing diet so that peg tube can be removed.  She reports that peg tube has been uncomfortable with movement this week.  No s/sx of infection, peg in place and not clogged instructed her to try and secure the tube for better support.  She verbalizes understanding and instructed her to call next week if she wasn't feeling well and needed to be seen by [facility].  REVIEW OF SYSTEMS:   Review of Systems  Constitutional:  Negative for chills, fever and malaise/fatigue.  HENT:   Negative for sore throat.   Respiratory: Negative.  Negative for cough, hemoptysis and shortness of breath.   Cardiovascular: Negative.  Negative for chest pain and leg swelling.  Gastrointestinal:  Negative for abdominal pain, constipation, nausea and vomiting.  Genitourinary: Negative.  Negative for dysuria.  Musculoskeletal: Negative.  Negative for back pain.  Skin: Negative.  Negative for rash.  Neurological:  Negative for dizziness, focal weakness, weakness and headaches.  Psychiatric/Behavioral:  The patient is not nervous/anxious.     As per HPI. Otherwise, a complete review of systems is negative.  PAST MEDICAL HISTORY: Past Medical History:  Diagnosis Date   Asthma    Cancer (HCC)    head and neck, getting radiation   GERD (gastroesophageal reflux disease)    HTN (hypertension)     PAST SURGICAL HISTORY: Past Surgical History:  Procedure Laterality Date   dental procedure     IR GASTROSTOMY TUBE MOD SED  07/05/2024   IR US  LIVER BIOPSY  05/17/2024    FAMILY HISTORY: Family History  Problem Relation Age of Onset   Hypertension Maternal Grandmother    Diabetes Maternal Grandmother     ADVANCED DIRECTIVES (Y/N):  N  HEALTH MAINTENANCE: Social History   Tobacco Use   Smoking status: Former    Types: E-cigarettes   Smokeless tobacco: Never  Vaping Use   Vaping status: Former  Substance Use Topics   Alcohol use: Not Currently    Comment: social   Drug use: Yes    Types:  Marijuana     Colonoscopy:  PAP:  Bone density:  Lipid panel:  Allergies  Allergen Reactions   Shellfish Allergy Anaphylaxis   Tomato Other (See Comments)    Flares up exema   Droperidol  Anxiety    Felt jittery and became tachycardic after droperidol     Current Outpatient Medications  Medication Sig Dispense Refill   ALPRAZolam  (XANAX ) 0.25 MG tablet Take 1 tablet (0.25 mg total) by mouth at bedtime as needed for anxiety. 30 tablet 0   amLODipine  (NORVASC ) 5 MG tablet Place 1  tablet (5 mg total) into feeding tube daily. 30 tablet 0   Ensure Plus (ENSURE PLUS) LIQD Give 1 carton bolus via feeding tube 6 times a day.  Flush with 60ml of water  before and after each feeding.  Give additional 240 ml of water  BID for additional hydration Meets 100% of needs     LORazepam  (ATIVAN ) 0.5 MG tablet Take 0.5 mg by mouth every 8 (eight) hours as needed for anxiety.     metoprolol  succinate (TOPROL -XL) 25 MG 24 hr tablet Take 1 tablet (25 mg total) by mouth daily. 30 tablet 0   Nutritional Supplements (ENSURE CLEAR) LIQD Take 1 Bottle by mouth 2 (two) times daily. 296 mL 6   omeprazole  (PRILOSEC) 20 MG capsule Take 1 capsule (20 mg total) by mouth daily. Open capsule pour into water  administer through tube flush with water  after administration 30 capsule 1   ondansetron  (ZOFRAN ) 4 MG tablet Place 2 tablets (8 mg total) into feeding tube every 8 (eight) hours as needed for nausea or vomiting. 30 tablet 0   oxyCODONE  (OXY IR/ROXICODONE ) 5 MG immediate release tablet Take 1 tablet (5 mg total) by mouth every 4 (four) hours as needed for severe pain (pain score 7-10). 30 tablet 0   oxycodone  (OXY-IR) 5 MG capsule Take 5 mg by mouth every 4 (four) hours as needed.     potassium chloride  20 MEQ/15ML (10%) SOLN Take 15 mLs (20 mEq total) by mouth daily. 473 mL 2   prochlorperazine  (COMPAZINE ) 10 MG tablet Take 1 tablet (10 mg total) by mouth every 8 (eight) hours as needed for nausea or vomiting. 30 tablet 0   SLYND  4 MG TABS Take 1 tablet by mouth daily.     Water  For Irrigation, Sterile (FREE WATER ) SOLN Place 60 mLs into feeding tube 6 (six) times daily. (Patient not taking: Reported on 09/07/2024)     No current facility-administered medications for this visit.    OBJECTIVE: Vitals:   09/07/24 1327  BP: 119/87  Pulse: (!) 102  Resp: 18  Temp: (!) 97.2 F (36.2 C)  SpO2: 100%       Body mass index is 34.02 kg/m.    ECOG FS:1 - Symptomatic but completely ambulatory  General:  Well-developed, well-nourished, no acute distress. Eyes: Pink conjunctiva, anicteric sclera. HEENT: Normocephalic, moist mucous membranes. Lungs: No audible wheezing or coughing. Heart: Regular rate and rhythm. Abdomen: Soft, nontender, no obvious distention. Musculoskeletal: No edema, cyanosis, or clubbing. Neuro: Alert, answering all questions appropriately. Cranial nerves grossly intact. Skin: No rashes or petechiae noted. Psych: Normal affect.  LAB RESULTS:  Lab Results  Component Value Date   NA 138 09/07/2024   K 4.6 09/07/2024   CL 101 09/07/2024   CO2 25 09/07/2024   GLUCOSE 111 (H) 09/07/2024   BUN 10 09/07/2024   CREATININE 0.95 09/07/2024   CALCIUM 10.3 09/07/2024   PROT 7.0 09/07/2024   ALBUMIN 4.0 09/07/2024  AST 27 09/07/2024   ALT 32 09/07/2024   ALKPHOS 71 09/07/2024   BILITOT 0.2 09/07/2024   GFRNONAA >60 09/07/2024   GFRAA >60 12/05/2019          Latest Ref Rng & Units 09/07/2024    1:04 PM 08/30/2024    9:36 AM 08/22/2024    9:08 AM  CBC EXTENDED  WBC 4.0 - 10.5 K/uL 7.4  5.5  2.3   RBC 3.87 - 5.11 MIL/uL 2.99  2.77  2.44   Hemoglobin 12.0 - 15.0 g/dL 9.7  9.0  8.0   HCT 63.9 - 46.0 % 30.1  27.7  24.1   Platelets 150 - 400 K/uL 388  551  426   NEUT# 1.7 - 7.7 K/uL 6.0  4.0  1.4   Lymph# 0.7 - 4.0 K/uL 0.4  0.4  0.3     Lab Results  Component Value Date   MG 1.8 09/07/2024   MG 1.9 08/30/2024   MG 1.6 (L) 08/22/2024   Lab Results  Component Value Date   IRON 61 08/22/2024   TIBC 260 08/22/2024   FERRITIN 301 08/22/2024    Lab Results  Component Value Date   VITAMINB12 1,745 (H) 08/22/2024    Lab Results  Component Value Date   FOLATE 6.1 08/22/2024      STUDIES: ECHOCARDIOGRAM COMPLETE Result Date: 08/17/2024    ECHOCARDIOGRAM REPORT   Patient Name:   [facility] Krass Date of Exam: 08/16/2024 Medical Rec #:  [id number]    Height:       62.0 in Accession #:    [id number]   Weight:       195.1 lb Date of Birth:  21-Jun-1996   BSA:           1.892 m Patient Age:    28 years     BP:           107/61 mmHg Patient Gender: F            HR:           112 bpm. Exam Location:  [facility] Procedure: 2D Echo, 3D Echo, Cardiac Doppler, Color Doppler and Strain Analysis            (Both Spectral and Color Flow Doppler were utilized during            procedure). Indications:     R07.9 Chest pain                  Z09 Chemo  History:         Patient has prior history of Echocardiogram examinations, most                  recent 03/02/2019. Risk Factors:Hypertension.  Sonographer:     Carl Coma RDCS Referring Phys:  [id number] CARALYN HUDSON Diagnosing Phys: Marsa Dooms MD IMPRESSIONS  1. Left ventricular ejection fraction, by estimation, is 55 to 60%. The left ventricle has normal function. The left ventricle has no regional wall motion abnormalities. Left ventricular diastolic parameters were normal. The average left ventricular global longitudinal strain is -21.4 %. The global longitudinal strain is normal.  2. Right ventricular systolic function is normal. The right ventricular size is normal.  3. The mitral valve is normal in structure. No evidence of mitral valve regurgitation. No evidence of mitral stenosis.  4. The aortic valve is normal in structure. Aortic valve regurgitation is not visualized. No aortic stenosis is present.  5. The inferior vena  cava is normal in size with greater than 50% respiratory variability, suggesting right atrial pressure of 3 mmHg. FINDINGS  Left Ventricle: Left ventricular ejection fraction, by estimation, is 55 to 60%. The left ventricle has normal function. The left ventricle has no regional wall motion abnormalities. The average left ventricular global longitudinal strain is -21.4 %. Strain was performed and the global longitudinal strain is normal. The left ventricular internal cavity size was normal in size. There is no left ventricular hypertrophy. Left ventricular diastolic parameters were normal. Right  Ventricle: The right ventricular size is normal. No increase in right ventricular wall thickness. Right ventricular systolic function is normal. Left Atrium: Left atrial size was normal in size. Right Atrium: Right atrial size was normal in size. Pericardium: There is no evidence of pericardial effusion. Mitral Valve: The mitral valve is normal in structure. No evidence of mitral valve regurgitation. No evidence of mitral valve stenosis. Tricuspid Valve: The tricuspid valve is normal in structure. Tricuspid valve regurgitation is not demonstrated. No evidence of tricuspid stenosis. Aortic Valve: The aortic valve is normal in structure. Aortic valve regurgitation is not visualized. No aortic stenosis is present. Pulmonic Valve: The pulmonic valve was normal in structure. Pulmonic valve regurgitation is not visualized. No evidence of pulmonic stenosis. Aorta: The aortic root is normal in size and structure. Venous: The inferior vena cava is normal in size with greater than 50% respiratory variability, suggesting right atrial pressure of 3 mmHg. IAS/Shunts: No atrial level shunt detected by color flow Doppler. Additional Comments: 3D was performed not requiring image post processing on an independent workstation and was normal.  LEFT VENTRICLE PLAX 2D LVIDd:         4.30 cm   Diastology LVIDs:         3.00 cm   LV e' medial:    9.82 cm/s LV PW:         1.00 cm   LV E/e' medial:  8.6 LV IVS:        1.00 cm   LV e' lateral:   14.60 cm/s LVOT diam:     1.80 cm   LV E/e' lateral: 5.8 LV SV:         37 LV SV Index:   19        2D Longitudinal Strain LVOT Area:     2.54 cm  2D Strain GLS (A4C):   -19.8 %                          2D Strain GLS (A3C):   -23.3 %                          2D Strain GLS (A2C):   -21.0 %                          2D Strain GLS Avg:     -21.4 %                           3D Volume EF:                          3D EF:        57 %  LV EDV:       141 ml                          LV  ESV:       61 ml                          LV SV:        80 ml RIGHT VENTRICLE             IVC RV Basal diam:  3.10 cm     IVC diam: 1.60 cm RV S prime:     11.10 cm/s TAPSE (M-mode): 2.3 cm LEFT ATRIUM             Index        RIGHT ATRIUM          Index LA diam:        3.80 cm 2.01 cm/m   RA Area:     8.43 cm LA Vol (A2C):   34.1 ml 18.03 ml/m  RA Volume:   16.00 ml 8.46 ml/m LA Vol (A4C):   26.5 ml 14.01 ml/m LA Biplane Vol: 30.5 ml 16.12 ml/m  AORTIC VALVE LVOT Vmax:   105.10 cm/s LVOT Vmean:  69.500 cm/s LVOT VTI:    0.144 m  AORTA Ao Root diam: 3.00 cm MITRAL VALVE MV Area (PHT): 5.72 cm    SHUNTS MV Decel Time: 133 msec    Systemic VTI:  0.14 m MV E velocity: 84.20 cm/s  Systemic Diam: 1.80 cm MV A velocity: 83.50 cm/s MV E/A ratio:  1.01 Marsa Dooms MD Electronically signed by Marsa Dooms MD Signature Date/Time: 08/17/2024/12:53:30 PM    Final     ASSESSMENT: Stage III EBV positive nasopharyngeal carcinoma.  PLAN:    Nausea/vomiting/Reflux/Dehydration: Resolved per patient reports no need for IVF bolus this week continue to monitor Hypokalemia:  Today k at 4.6 will continue to monitor instructed her to continue to take potassium supplement at home Hypomagnesia: Today at 1.8 instructed her to continue to take magnesium  at home will continue to monitor weekly no need for magnesium  replacement today Anxiety/depression:  Patient in high stress situation with her health.  Very anxious at times, flat affect but seems to be improving since feeling better post treatment  Continues to work with our clinical child psychotherapist.  Continue with PRN xanax  at home overall greatly improving Anemia: unknown source, patient reports a history of IDA in past.  Improved this visit with Hg at 9.7.  Checked Iron panel, vitamin b12, and folate last week all were stable does not indicate iron deficiency at this time will continue to monitor.  Could slowly improve post chemo will continue to closely  monitor discussed with Dr. Jacobo. Continues to improve each week Throat Pain:  Reporting ongoing throat pain when yawning this week she reports improvement in pain She met with Joli RD today and discussed starting to eat more soft foods PO she has been drinking water  and Gatorade now.  She has switched to ensure via tube as she reports tolerating it better in past.  She is following up with ENT on 09/26/24 for ongoing pain. Pain around peg site:  mild only with movement, site looks healthy today with no s/sx of infection instructed patient on securing her peg tube for more stability while moving and in bed she verbalizes understanding    Patient expressed understanding and  was in agreement with this plan. She also understands that She can call clinic at any time with any questions, concerns, or complaints.  Follow-up plan: No IV fluids or Mg today F/U in 2 weeks see np and Joli RD with cbc with diff, cmp, mg possible IV infusion LP        Cancer Staging  Nasopharyngeal carcinoma (HCC) Staging form: Pharynx - Nasopharynx, AJCC V9 - Clinical stage from 05/30/2024: Stage III (cTX, cN3, cM0) - Signed by [name], MD on 05/30/2024 Stage prefix: Initial diagnosis Method of lymph node assessment: Clinical   Morna Husband, NP   09/08/2024 9:35 AM     "

## 2024-09-11 ENCOUNTER — Other Ambulatory Visit: Payer: Self-pay

## 2024-09-11 DIAGNOSIS — Z139 Encounter for screening, unspecified: Secondary | ICD-10-CM

## 2024-09-11 NOTE — Patient Outreach (Signed)
 Complex Care Management   Visit Note  09/11/2024  Name:  Brandi [name] MRN: [id number] DOB: 09/16/1995  Situation: Referral received for Complex Care Management related to high utilization related to cancer treatment side effects I obtained verbal consent from Patient.  Visit completed with Patient  on the phone  Background:   Past Medical History:  Diagnosis Date   Asthma    Cancer (HCC)    head and neck, getting radiation   GERD (gastroesophageal reflux disease)    HTN (hypertension)     Assessment: Patient Reported Symptoms:  Cognitive Cognitive Status: Able to follow simple commands, Alert and oriented to person, place, and time, Normal speech and language skills Cognitive/Intellectual Conditions Management [RPT]: None reported or documented in medical history or problem list   Health Maintenance Behaviors: Annual physical exam, Stress management Health Facilitated by: Rest, Stress management  Neurological Neurological Review of Symptoms: No symptoms reported    HEENT HEENT Symptoms Reported: Other: HEENT Management Strategies: Adequate rest, Coping strategies, Routine screening HEENT Comment: Patient reports visit with ENT coming up in the next few weeks    Cardiovascular Cardiovascular Symptoms Reported: No symptoms reported Does patient have uncontrolled Hypertension?: No Cardiovascular Management Strategies: Coping strategies, Adequate rest, Routine screening  Respiratory Respiratory Symptoms Reported: No symptoms reported    Endocrine Endocrine Symptoms Reported: Not assessed Is patient diabetic?: No    Gastrointestinal Gastrointestinal Symptoms Reported: No symptoms reported Additional Gastrointestinal Details: Patient reports regular BMs Gastrointestinal Management Strategies: Coping strategies, Medication therapy, Nutrition support Enteral Nutrition Time Frame: Bolus Enteral Nutrition Schedule: 6 times a day Gastrointestinal Comment: Patient reports she is  working with nutritionist to advance oral diet, and is hopeful PEG tube can be removed. She reports he is currently doing 6 tube feeds a day, and not a lot of oral intake    Genitourinary Genitourinary Symptoms Reported: Not assessed    Integumentary Integumentary Symptoms Reported: Not assessed    Musculoskeletal Musculoskelatal Symptoms Reviewed: No symptoms reported Musculoskeletal Management Strategies: Coping strategies, Routine screening Musculoskeletal Comment: Patient denies falls since previous CMRN visit Falls in the past year?: No Number of falls in past year: 1 or less Was there an injury with Fall?: No Fall Risk Category Calculator: 0 Patient Fall Risk Level: Low Fall Risk Patient at Risk for Falls Due to: No Fall Risks Fall risk Follow up: Falls evaluation completed, Education provided, Falls prevention discussed  Psychosocial Psychosocial Symptoms Reported: No symptoms reported          09/11/2024    PHQ2-9 Depression Screening   Little interest or pleasure in doing things    Feeling down, depressed, or hopeless    PHQ-2 - Total Score    Trouble falling or staying asleep, or sleeping too much    Feeling tired or having little energy    Poor appetite or overeating     Feeling bad about yourself - or that you are a failure or have let yourself or your family down    Trouble concentrating on things, such as reading the newspaper or watching television    Moving or speaking so slowly that other people could have noticed.  Or the opposite - being so fidgety or restless that you have been moving around a lot more than usual    Thoughts that you would be better off dead, or hurting yourself in some way    PHQ2-9 Total Score    If you checked off any problems, how difficult have these problems made it  for you to do your work, take care of things at home, or get along with other people    Depression Interventions/Treatment      There were no vitals filed for this  visit. Pain Scale: 0-10 Pain Score: 0-No pain  Medications Reviewed Today     Reviewed by Arno Rosaline SQUIBB, RN (Registered Nurse) on 09/11/24 at 1403  Med List Status: <None>   Medication Order Taking? Sig Documenting Provider Last Dose Status Informant  ALPRAZolam  (XANAX ) 0.25 MG tablet [id number] Yes Take 1 tablet (0.25 mg total) by mouth at bedtime as needed for anxiety. [name], MD  Active Self, Pharmacy Records  amLODipine  (NORVASC ) 5 MG tablet [id number] Yes Place 1 tablet (5 mg total) into feeding tube daily. [name], MD  Active Self, Pharmacy Records  Ensure Plus (ENSURE PLUS) BERNICE [id number] Yes Give 1 carton bolus via feeding tube 6 times a day.  Flush with 60ml of water  before and after each feeding.  Give additional 240 ml of water  BID for additional hydration Meets 100% of needs Lanell Jacobsen, NP  Active   LORazepam  (ATIVAN ) 0.5 MG tablet [id number] Yes Take 0.5 mg by mouth every 8 (eight) hours as needed for anxiety. [provider]  Active Self, Pharmacy Records  metoprolol  succinate (TOPROL -XL) 25 MG 24 hr tablet [id number] Yes Take 1 tablet (25 mg total) by mouth daily. Lanetta Lingo, MD  Active   Nutritional Supplements (ENSURE CLEAR) LIQD [id number] Yes Take 1 Bottle by mouth 2 (two) times daily. [name], MD  Active Self, Pharmacy Records  omeprazole  [facility]) 20 MG capsule [id number] Yes Take 1 capsule (20 mg total) by mouth daily. Open capsule pour into water  administer through tube flush with water  after administration Lanell Jacobsen, NP  Active Self, Pharmacy Records  ondansetron  (ZOFRAN ) 4 MG tablet [id number] Yes Place 2 tablets (8 mg total) into feeding tube every 8 (eight) hours as needed for nausea or vomiting. [name], MD  Active   oxyCODONE  (OXY IR/ROXICODONE ) 5 MG immediate release tablet [id number] Yes Take 1 tablet (5 mg total) by mouth every 4 (four) hours as needed for severe pain (pain score 7-10).  Lanell Jacobsen, NP  Active   oxycodone  (OXY-IR) 5 MG capsule [id number] Yes Take 5 mg by mouth every 4 (four) hours as needed. [provider]  Active Self, Pharmacy Records  potassium chloride  20 MEQ/15ML (10%) SOLN [id number] Yes Take 15 mLs (20 mEq total) by mouth daily. Lanell Jacobsen, NP  Active Self, Pharmacy Records  prochlorperazine  (COMPAZINE ) 10 MG tablet [id number] Yes Take 1 tablet (10 mg total) by mouth every 8 (eight) hours as needed for nausea or vomiting. Borders, Fonda SAUNDERS, NP  Active Self, Pharmacy Records  SLYND  4 MG TABS [id number] Yes Take 1 tablet by mouth daily. [provider]  Active Self, Pharmacy Records  Water  For Irrigation, Sterile (FREE WATER ) SOLN [id number]  Place 60 mLs into feeding tube 6 (six) times daily.  Patient not taking: Reported on 09/07/2024   Josette Ade, MD  Active Self, Pharmacy Records            Recommendation:   Continue Current Plan of Care  Follow Up Plan:   Telephone follow up appointment date/time:  10/10/24 at 2 PM with Olam Ku, RNCM  Rosaline Arno, RN MSN [place]  [facility] RN Care Manager Direct Dial: [phone]  Fax: [phone]

## 2024-09-11 NOTE — Patient Instructions (Signed)
 Visit Information  Ms. Hands was given information about Medicaid Managed Care team care coordination services as a part of their [employer] Medicaid benefit.   If you would like to schedule transportation through your [facility] plan, please call the following number at least 2 days in advance of your appointment: [phone]  If you are experiencing a behavioral health crisis, call the [employer] [place]  Behavioral Health Crisis Line at [phone]). The line is available 24 hours a day, seven days a week.   Care plan and visit instructions communicated with the patient verbally today. Patient agrees to receive a copy in MyChart. Active MyChart status and patient understanding of how to access instructions and care plan via MyChart confirmed with patient.     Telephone follow up appointment with Managed Medicaid care management team member scheduled for:10/10/24 at 2 PM with Olam Ku, RNCM  Rosaline Finlay, RN MSN [place]  [facility] RN Care Manager Direct Dial: [phone]  Fax: [phone]   Following is a copy of your plan of care:   Goals Addressed             This Visit's Progress    VBCI RN Care Plan   On track    Problems:  Chronic Disease Management support and education needs related to frequent utilization related to side effects of cancer treatment  Goal: Over the next 30 days the Patient will experience decrease in ED visits as evidenced by electronic medical record review; ED visits in last 6 months = 8 not experience hospital admission as evidenced by review of electronic medical record. Hospital Admissions in last 6 months = 7 work with child psychotherapist to address disability application related to the management of chronic conditions as evidenced by review of electronic medical record and patient or social worker report     work with nutritionist to advance oral intake as  evidenced by review of electronic medical record and patient or care team member report   Report decreased side effects of cancer treatment (sore throat, dry mouth, nausea, vomiting) as evidenced by patient report  Interventions:   Evaluation of current treatment plan related to cancer treatment, disability application self-management and patient's adherence to plan as established by provider. Discussed plans with patient for ongoing care management follow up and provided patient with direct contact information for care management team Reviewed scheduled/upcoming provider appointments including ENT Social Work referral for assistance reapplying for disability Screening for signs and symptoms of depression related to chronic disease state  Ensured patient currently experiencing decreased or no SE related to cancer treatment. Patient has completed chemotherapy and radiation, with regular follow-up with oncology  Patient Self-Care Activities:  Attend all scheduled provider appointments Call provider office for new concerns or questions  Perform all self care activities independently  Perform IADL's (shopping, preparing meals, housekeeping, managing finances) independently Take medications as prescribed   Work with the social worker to address care coordination needs and will continue to work with the clinical team to address health care and disease management related needs  Plan:  Telephone follow up appointment with care management team member scheduled for:  10/10/24 at 2 PM with Olam Ku, [facility]

## 2024-09-13 ENCOUNTER — Telehealth: Payer: Self-pay | Admitting: Radiation Oncology

## 2024-09-13 ENCOUNTER — Other Ambulatory Visit: Payer: Self-pay | Admitting: *Deleted

## 2024-09-13 ENCOUNTER — Encounter: Payer: Self-pay | Admitting: Radiation Oncology

## 2024-09-13 ENCOUNTER — Ambulatory Visit
Admission: RE | Admit: 2024-09-13 | Discharge: 2024-09-13 | Disposition: A | Discharge status: Home / Self Care | Source: Ambulatory Visit | Attending: Radiation Oncology | Admitting: Radiation Oncology

## 2024-09-13 VITALS — BP 139/93 | HR 95 | Temp 98.5°F | Resp 15 | Ht 62.0 in | Wt 191.6 lb

## 2024-09-13 DIAGNOSIS — Z923 Personal history of irradiation: Secondary | ICD-10-CM | POA: Insufficient documentation

## 2024-09-13 DIAGNOSIS — C01 Malignant neoplasm of base of tongue: Secondary | ICD-10-CM | POA: Diagnosis present

## 2024-09-13 DIAGNOSIS — C770 Secondary and unspecified malignant neoplasm of lymph nodes of head, face and neck: Secondary | ICD-10-CM | POA: Insufficient documentation

## 2024-09-13 DIAGNOSIS — C119 Malignant neoplasm of nasopharynx, unspecified: Secondary | ICD-10-CM

## 2024-09-13 DIAGNOSIS — E876 Hypokalemia: Secondary | ICD-10-CM | POA: Diagnosis not present

## 2024-09-13 DIAGNOSIS — Z9221 Personal history of antineoplastic chemotherapy: Secondary | ICD-10-CM | POA: Insufficient documentation

## 2024-09-13 NOTE — Progress Notes (Signed)
 Radiation Oncology Follow up Note  Name: Brandi [name]   Date:   09/13/2024 MRN:  [id number] DOB: [birth year]    This 29 y.o. 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  Seen today in routine follow-up she is doing much improved.  She still has her feeding tube although she is swallowing.  She is having no significant head and neck pain at this time..  She is not having any third further nausea she has switched her feeding tube nutrition.  COMPLICATIONS OF TREATMENT: none  FOLLOW UP COMPLIANCE: keeps appointments   PHYSICAL EXAM:  BP (!) 139/93 Comment: Has not taken medicine yet today  Pulse 95   Temp 98.5 F (36.9 C) (Tympanic)   Resp 15   Ht 5' 2 (1.575 m)   Wt 191 lb 9.6 oz (86.9 kg)   BMI 35.04 kg/m  Oral cavity is clear no oral mucosal lesions are identified mass reduction in size of adenopathy in her cervical nodes is detected.  No supraclavicular adenopathy is noted.  Well-developed well-nourished patient in NAD. HEENT reveals PERLA, EOMI, discs not visualized.  Oral cavity is clear. No oral mucosal lesions are identified. Neck is clear without evidence of cervical or supraclavicular adenopathy. Lungs are clear to A&P. Cardiac examination is essentially unremarkable with regular rate and rhythm without murmur rub or thrill. Abdomen is benign with no organomegaly or masses noted. Motor sensory and DTR levels are equal and symmetric in the upper and lower extremities. Cranial nerves II through XII are grossly intact. Proprioception is intact. No peripheral adenopathy or edema is identified. No motor or sensory levels are noted. Crude visual fields are  within normal range.  RADIOLOGY RESULTS: I have ordered a CT scan of the soft tissue of the head and neck in 3 months  PLAN: Present time she is working with nurse practitioners on advancing her soft diet.  I encouraged this so she will not forget how to swallow.  They are treating her hypokalemia and hypomagnesia him.  I will see her back in 3 months and will have a CT scan of the soft tissue head and neck performed prior to that.  I have asked her to continue follow-ups with ENT as well as medical oncology.  She continues close follow-up care with medical oncology.  I would like to take this opportunity to thank you for allowing me to participate in the care of your patient.Brandi Marcey Penton, MD

## 2024-09-13 NOTE — Telephone Encounter (Signed)
 Called pt to sched CT - pt confirmed date/time - pt requested appt reminder via mail - sent via mail and mychart - LH

## 2024-09-14 ENCOUNTER — Telehealth: Payer: Self-pay

## 2024-09-14 NOTE — Telephone Encounter (Signed)
 CSW Intern attempted to contact patient by phone to follow up on emotional support. Intern left patient a voicemail with direct contact information and encouraged pt to call back anytime.  Thersia Daring Clinical Social Work Intern [employer]

## 2024-09-19 ENCOUNTER — Telehealth: Payer: Self-pay | Admitting: *Deleted

## 2024-09-19 NOTE — Progress Notes (Unsigned)
 Complex Care Management Care Guide Note  09/19/2024 Name: Brandi [name] MRN: [id number] DOB: Aug 02, 1996  Brandi [name] is a 29 y.o. year old female who is a primary care patient of Osei-Bonsu, Zachary, MD and is actively engaged with the care management team. I reached out to Brandi [name] by phone today to assist with re-scheduling  with the BSW.  Follow up plan: Unsuccessful telephone outreach attempt made. A HIPAA compliant phone message was left for the patient providing contact information and requesting a return call.  Thedford Franks, CMA, AAMA   [facility], [facility] Guide, Lead Direct Dial: [phone]  Fax: [phone]

## 2024-09-21 ENCOUNTER — Other Ambulatory Visit: Payer: Self-pay

## 2024-09-21 DIAGNOSIS — C119 Malignant neoplasm of nasopharynx, unspecified: Secondary | ICD-10-CM

## 2024-09-21 NOTE — Progress Notes (Signed)
 Complex Care Management Care Guide Note  09/21/2024 Name: [name] MRN: [id number] DOB: [birth year]  Brandi [name] is a 29 y.o. year old female who is a primary care patient of Osei-Bonsu, Zachary, MD and is actively engaged with the care management team. I reached out to Lotus Monte by phone today to assist with re-scheduling  with the BSW.  Follow up plan: Unsuccessful telephone outreach attempt made. A HIPAA compliant phone message was left for the patient providing contact information and requesting a return call.  No further outreach attempts will be made due to inability to maintain patient contact.   Thedford Franks, CMA, AAMA   [facility], [facility] Guide, Lead Direct Dial: [phone]  Fax: [phone]

## 2024-09-22 ENCOUNTER — Inpatient Hospital Stay: Admitting: Nurse Practitioner

## 2024-09-22 ENCOUNTER — Telehealth: Payer: Self-pay | Admitting: Nurse Practitioner

## 2024-09-22 ENCOUNTER — Inpatient Hospital Stay

## 2024-09-22 NOTE — Telephone Encounter (Signed)
 Pt no showed appts today, I called and spoke with pt and r.s appts, confirmed new date and time

## 2024-09-28 ENCOUNTER — Inpatient Hospital Stay

## 2024-09-28 ENCOUNTER — Inpatient Hospital Stay: Admitting: Nurse Practitioner

## 2024-09-29 ENCOUNTER — Inpatient Hospital Stay

## 2024-09-29 ENCOUNTER — Inpatient Hospital Stay: Admitting: Nurse Practitioner

## 2024-10-06 ENCOUNTER — Emergency Department
Admission: EM | Admit: 2024-10-06 | Discharge: 2024-10-06 | Disposition: A | Discharge status: Home / Self Care | Source: Home / Self Care | Attending: Emergency Medicine | Admitting: Emergency Medicine

## 2024-10-06 ENCOUNTER — Encounter: Payer: Self-pay | Admitting: Emergency Medicine

## 2024-10-06 ENCOUNTER — Other Ambulatory Visit: Payer: Self-pay

## 2024-10-06 ENCOUNTER — Emergency Department

## 2024-10-06 DIAGNOSIS — Z931 Gastrostomy status: Secondary | ICD-10-CM

## 2024-10-06 MED ORDER — STERILE WATER FOR IRRIGATION IR SOLN: Freq: Once | Status: DC

## 2024-10-06 NOTE — Discharge Instructions (Addendum)
 You were evaluated in the ED for your feeding tube possibly being dislodged.  We performed an x-ray which shows the tube in the correct placement.  For the first 24 hours keep the tape around ring. Follow up with your primary care provider or general surgeon in 1 week.    If any new or worsening symptoms occur, return to ED.    MEDICAID TRANSPORTATION   In [place] , Medicaid provides free Non-Emergency Medical Transportation (NEMT) for covered appointments, arranged through your managed care health plan (like [place] [facility], [place]) or county DSS, covering rides in personal cars, taxis, vans, or public transit, with options for mileage reimbursement or even lodging for long trips, ensuring access to diagnosis, treatment, and follow-up care for eligible members.   How It Works  Check Your Plan: Your [place] Medicaid Health Plan (Managed Care or Tailored Plan) is responsible for arranging this service.  Request a Ride: Contact your health plan or their transportation broker (like MTM for [facility] members) to schedule a ride to a Medicaid-covered service.   Provide Notice: Give a few days' notice (e.g., 3 business days) for non-urgent trips.  Get Covered: You must have a Medicaid-covered service (like a doctor's visit, therapy) to qualify for the ride.  What's Covered Transportation Modes: Personal vehicles (mileage reimbursement), taxis, vans (including lift-equipped), mini-buses, public transit.   Longer Trips: Meals and lodging for trips 75+ miles one-way.  Other Needs (Plan Specific): Some plans (like [facility]) offer extra rides for rehab, DSS appointments, or community programs.   Who's Eligible   Most [place] Medicaid beneficiaries. You must be in an independent living situation. Not for [place] Health Choice or nursing home residents.  Key Providers/Brokers   MTM: Works with [employer]. [employer]: Manages Tailored Plan transport. [employer]: Offers assistance  for various needs. County DSS: Customer Service Manager offices arrange transport.  [employer] [phone] [facility] Complete Health [phone] [employer] [phone] [facility] Plan [phone] [facility] [phone] [facility] [phone] [employer] [phone] [facility] Resources [phone] [facility] Total Care [phone]   Applying for SSI  Online: You can begin the application process and complete an online disability report at [email or web address].  Phone Appointment: Call your local Social Security office to schedule a telephone appointment with a representative.  In-Person: Visit your local Social Security office; it's recommended to schedule an appointment first.

## 2024-10-06 NOTE — Progress Notes (Addendum)
 Patient provided resources for Medicaid transportation. Patient and CSW discussed applying for Social Security benefits. She advised that she did once and was denied. CSW encouraged her to continue applying. She reported [facility] to apply for SSI.   Information added to the AVS for patient to follow up. Patient and CSW discussed FNS/[employer]. Patient stated that she received benefits previously but when she cut off, she never went back to reapply.   Patient advised that she has transportation arranged for her discharge.

## 2024-10-06 NOTE — ED Triage Notes (Signed)
 Pt states she went to feed self through feeding tube this morning and notice that more tubing was out than usual. Pt states she push tube back in and was able to feed self. Denies any pain.

## 2024-10-06 NOTE — ED Provider Notes (Cosign Needed)
 "   [facility] Emergency Department Provider Note     Event Date/Time   First MD Initiated Contact with Patient 10/06/24 1054     (approximate)   History   Feeding Tube Loose   HPI  Brandi [name] is a 29 y.o. female with a past medical history of HTN, nasopharyngeal carcinoma, GERD, asthma presents to the ED with concerns of her feeding to being loose.  Patient has a PEG tube insertion since 11/25.  She reports she was feeding through her tube this morning and it appeared the tube was out more than normal.  She reports she was able to push the tube back in and place tape over the ring held against her abdomen. She states being able to feed through the tube with no issue. She denies any pain, fever, chills, nausea or vomiting.  No other complaint.     Physical Exam   Triage Vital Signs: ED Triage Vitals  Encounter Vitals Group     BP 10/06/24 0946 131/89     Girls Systolic BP Percentile --      Girls Diastolic BP Percentile --      Boys Systolic BP Percentile --      Boys Diastolic BP Percentile --      Pulse Rate 10/06/24 0946 93     Resp 10/06/24 0946 18     Temp 10/06/24 0946 98.6 F (37 C)     Temp src --      SpO2 10/06/24 0946 100 %     Weight --      Height --      Head Circumference --      Peak Flow --      Pain Score 10/06/24 0947 0     Pain Loc --      Pain Education --      Exclude from Growth Chart --     Most recent vital signs: Vitals:   10/06/24 0946  BP: 131/89  Pulse: 93  Resp: 18  Temp: 98.6 F (37 C)  SpO2: 100%    General Awake, no distress.  Well-appearing HEENT NCAT.  CV:  Good peripheral perfusion.  RESP:  Normal effort.  ABD:  No distention.  NBS x 4.  Soft, nontender.  PEG tube placement without signs of infection including erythema, warmth or discharge/drainage.  Tape is applied over external retention ring  ED Results / Procedures / Treatments   Labs (all labs ordered are listed, but only abnormal  results are displayed) Labs Reviewed - No data to display  RADIOLOGY  I personally viewed and evaluated these images as part of my medical decision making, as well as reviewing the written report by the radiologist.  ED Provider Interpretation: PEG tube in appropriate placement   DG Abdomen 1 View Result Date: 10/06/2024 CLINICAL DATA:  Peg tube location check. EXAM: ABDOMEN - 1 VIEW COMPARISON:  July 24, 2024 FINDINGS: A percutaneous gastrostomy tube is seen with its distal tip overlying the gastric body. Radiopaque contrast is noted within the lumen of the gastrostomy tube, gastric fundus and gastric antrum. The bowel gas pattern is normal. No radio-opaque calculi or other significant radiographic abnormality are seen. IMPRESSION: Percutaneous gastrostomy tube positioning, as described above. Electronically Signed   By: Suzen Dials M.D.   On: 10/06/2024 11:27    PROCEDURES:  Critical Care performed: No  Procedures   MEDICATIONS ORDERED IN ED: Medications - No data to display   IMPRESSION / MDM /  ASSESSMENT AND PLAN / ED COURSE  I reviewed the triage vital signs and the nursing notes.                             Clinical Course as of 10/06/24 1215  Fri Oct 06, 2024  1135 DG Abdomen 1 View A percutaneous gastrostomy tube is seen with its distal tip overlying the gastric body. Radiopaque contrast is noted within the lumen of the gastrostomy tube, gastric fundus and gastric antrum. The bowel gas pattern is normal. No radio-opaque calculi or other significant radiographic abnormality are seen.   [MH]    Clinical Course User Index [MH] Margrette Monte A, PA-C   29 y.o. female presents to the emergency department for evaluation and treatment of possible feeding tube dislodged. See HPI for further details.   Differential diagnosis includes, but is not limited to PEG tube dislodgment   Patient's presentation is most consistent with acute, uncomplicated  illness.  Patient is NPO with PEG tube due to nasopharyngeal cancer and radiation/chemotherapy treatments. She is asymptomatic. Physical exam findings are normal. No signs of peritonitis. We will obtain Abdominal xray for placement of PEG tube, ensure functioning appropriately with irrigation. Presuming successful, patient will be stable for discharge home.   X-ray reveals PEG tube overlying the gastric body.  Patient is able to continue to self feed through the tube without any problems.  The balloon is inflated as there is a taut pull on examination.  Given that she is asymptomatic and tube is in the correct placement she is in stable condition for discharge home.  Advised follow-up with primary care or general surgery in 1 week for reassurance.  ED return precautions were discussed.   FINAL CLINICAL IMPRESSION(S) / ED DIAGNOSES   Final diagnoses:  Gastrointestinal tube in situ [facility])   Rx / DC Orders   ED Discharge Orders     None        Note:  This document was prepared using Dragon voice recognition software and may include unintentional dictation errors.    Margrette, Job Holtsclaw A, PA-C 10/06/24 1216  "

## 2024-10-06 NOTE — ED Notes (Signed)
Patient is in imaging

## 2024-10-10 ENCOUNTER — Telehealth

## 2024-10-11 ENCOUNTER — Inpatient Hospital Stay

## 2024-10-12 ENCOUNTER — Telehealth

## 2024-11-29 ENCOUNTER — Other Ambulatory Visit

## 2024-12-13 ENCOUNTER — Ambulatory Visit: Admitting: Radiation Oncology
